# Patient Record
Sex: Female | Born: 1960
Health system: Southern US, Community
[De-identification: ages and names within clinical notes are randomized; demographics above are authoritative.]

## PROBLEM LIST (undated history)

## (undated) DIAGNOSIS — G629 Polyneuropathy, unspecified: Secondary | ICD-10-CM

## (undated) DIAGNOSIS — N049 Nephrotic syndrome with unspecified morphologic changes: Secondary | ICD-10-CM

## (undated) DIAGNOSIS — F4321 Adjustment disorder with depressed mood: Secondary | ICD-10-CM

## (undated) DIAGNOSIS — I10 Essential (primary) hypertension: Secondary | ICD-10-CM

## (undated) DIAGNOSIS — E119 Type 2 diabetes mellitus without complications: Secondary | ICD-10-CM

## (undated) DIAGNOSIS — R300 Dysuria: Secondary | ICD-10-CM

## (undated) DIAGNOSIS — R319 Hematuria, unspecified: Secondary | ICD-10-CM

## (undated) DIAGNOSIS — R251 Tremor, unspecified: Secondary | ICD-10-CM

## (undated) DIAGNOSIS — N189 Chronic kidney disease, unspecified: Secondary | ICD-10-CM

## (undated) DIAGNOSIS — R1110 Vomiting, unspecified: Secondary | ICD-10-CM

## (undated) DIAGNOSIS — R296 Repeated falls: Secondary | ICD-10-CM

## (undated) DIAGNOSIS — R600 Localized edema: Secondary | ICD-10-CM

## (undated) HISTORY — PX: BACK SURGERY: SHX140

## (undated) HISTORY — DX: Adjustment disorder with depressed mood: F43.21

## (undated) HISTORY — PX: FOOT SURGERY: SHX648

## (undated) HISTORY — PX: SHOULDER SURGERY: SHX246

---
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
## 1997-12-18 ENCOUNTER — Emergency Department (HOSPITAL_COMMUNITY): Admission: EM | Admit: 1997-12-18 | Discharge: 1997-12-18 | Payer: Self-pay | Admitting: Emergency Medicine

## 1998-05-29 ENCOUNTER — Emergency Department (HOSPITAL_COMMUNITY): Admission: EM | Admit: 1998-05-29 | Discharge: 1998-05-29 | Payer: Self-pay | Admitting: Emergency Medicine

## 1998-06-05 ENCOUNTER — Emergency Department (HOSPITAL_COMMUNITY): Admission: EM | Admit: 1998-06-05 | Discharge: 1998-06-05 | Payer: Self-pay | Admitting: Emergency Medicine

## 1998-06-19 ENCOUNTER — Encounter: Admission: RE | Admit: 1998-06-19 | Discharge: 1998-09-17 | Payer: Self-pay

## 2000-09-03 ENCOUNTER — Emergency Department (HOSPITAL_COMMUNITY): Admission: EM | Admit: 2000-09-03 | Discharge: 2000-09-03 | Payer: Self-pay | Admitting: Emergency Medicine

## 2000-12-21 ENCOUNTER — Emergency Department (HOSPITAL_COMMUNITY): Admission: EM | Admit: 2000-12-21 | Discharge: 2000-12-22 | Payer: Self-pay | Admitting: Emergency Medicine

## 2000-12-22 ENCOUNTER — Encounter: Payer: Self-pay | Admitting: Emergency Medicine

## 2001-08-05 ENCOUNTER — Emergency Department (HOSPITAL_COMMUNITY): Admission: EM | Admit: 2001-08-05 | Discharge: 2001-08-05 | Payer: Self-pay | Admitting: Emergency Medicine

## 2001-08-05 ENCOUNTER — Encounter: Payer: Self-pay | Admitting: Emergency Medicine

## 2002-04-19 ENCOUNTER — Encounter: Payer: Self-pay | Admitting: Emergency Medicine

## 2002-04-19 ENCOUNTER — Emergency Department (HOSPITAL_COMMUNITY): Admission: EM | Admit: 2002-04-19 | Discharge: 2002-04-19 | Payer: Self-pay | Admitting: Emergency Medicine

## 2003-04-20 ENCOUNTER — Encounter (INDEPENDENT_AMBULATORY_CARE_PROVIDER_SITE_OTHER): Payer: Self-pay | Admitting: *Deleted

## 2003-04-20 LAB — CONVERTED CEMR LAB

## 2003-10-23 ENCOUNTER — Ambulatory Visit (HOSPITAL_COMMUNITY): Admission: RE | Admit: 2003-10-23 | Discharge: 2003-10-23 | Payer: Self-pay | Admitting: Family Medicine

## 2003-10-26 ENCOUNTER — Emergency Department (HOSPITAL_COMMUNITY): Admission: EM | Admit: 2003-10-26 | Discharge: 2003-10-26 | Payer: Self-pay | Admitting: Emergency Medicine

## 2004-06-02 ENCOUNTER — Emergency Department (HOSPITAL_COMMUNITY): Admission: EM | Admit: 2004-06-02 | Discharge: 2004-06-02 | Payer: Self-pay | Admitting: Emergency Medicine

## 2004-06-09 ENCOUNTER — Ambulatory Visit: Payer: Self-pay | Admitting: Family Medicine

## 2005-04-13 ENCOUNTER — Emergency Department (HOSPITAL_COMMUNITY): Admission: EM | Admit: 2005-04-13 | Discharge: 2005-04-13 | Payer: Self-pay | Admitting: Emergency Medicine

## 2005-04-17 ENCOUNTER — Emergency Department (HOSPITAL_COMMUNITY): Admission: EM | Admit: 2005-04-17 | Discharge: 2005-04-17 | Payer: Self-pay | Admitting: Family Medicine

## 2005-06-30 ENCOUNTER — Emergency Department (HOSPITAL_COMMUNITY): Admission: EM | Admit: 2005-06-30 | Discharge: 2005-06-30 | Payer: Self-pay | Admitting: Emergency Medicine

## 2005-07-06 ENCOUNTER — Ambulatory Visit: Payer: Self-pay | Admitting: Family Medicine

## 2005-07-12 ENCOUNTER — Ambulatory Visit (HOSPITAL_COMMUNITY): Admission: RE | Admit: 2005-07-12 | Discharge: 2005-07-12 | Payer: Self-pay | Admitting: Family Medicine

## 2006-06-16 DIAGNOSIS — F17200 Nicotine dependence, unspecified, uncomplicated: Secondary | ICD-10-CM

## 2006-06-16 DIAGNOSIS — E1165 Type 2 diabetes mellitus with hyperglycemia: Secondary | ICD-10-CM

## 2006-06-16 DIAGNOSIS — IMO0002 Reserved for concepts with insufficient information to code with codable children: Secondary | ICD-10-CM

## 2006-06-16 DIAGNOSIS — E1142 Type 2 diabetes mellitus with diabetic polyneuropathy: Secondary | ICD-10-CM

## 2006-06-16 HISTORY — DX: Reserved for concepts with insufficient information to code with codable children: IMO0002

## 2006-06-16 HISTORY — DX: Type 2 diabetes mellitus with diabetic polyneuropathy: E11.42

## 2006-06-16 HISTORY — DX: Nicotine dependence, unspecified, uncomplicated: F17.200

## 2006-06-17 ENCOUNTER — Encounter (INDEPENDENT_AMBULATORY_CARE_PROVIDER_SITE_OTHER): Payer: Self-pay | Admitting: *Deleted

## 2006-10-24 ENCOUNTER — Emergency Department (HOSPITAL_COMMUNITY): Admission: EM | Admit: 2006-10-24 | Discharge: 2006-10-24 | Payer: Self-pay | Admitting: Emergency Medicine

## 2006-11-12 ENCOUNTER — Emergency Department (HOSPITAL_COMMUNITY): Admission: EM | Admit: 2006-11-12 | Discharge: 2006-11-12 | Payer: Self-pay | Admitting: Emergency Medicine

## 2006-12-15 ENCOUNTER — Emergency Department (HOSPITAL_COMMUNITY): Admission: EM | Admit: 2006-12-15 | Discharge: 2006-12-15 | Payer: Self-pay | Admitting: Family Medicine

## 2007-08-10 ENCOUNTER — Emergency Department (HOSPITAL_COMMUNITY): Admission: EM | Admit: 2007-08-10 | Discharge: 2007-08-10 | Payer: Self-pay | Admitting: Family Medicine

## 2008-01-15 ENCOUNTER — Emergency Department (HOSPITAL_COMMUNITY): Admission: EM | Admit: 2008-01-15 | Discharge: 2008-01-15 | Payer: Self-pay | Admitting: Emergency Medicine

## 2009-04-14 ENCOUNTER — Emergency Department (HOSPITAL_COMMUNITY): Admission: EM | Admit: 2009-04-14 | Discharge: 2009-04-14 | Payer: Self-pay | Admitting: Emergency Medicine

## 2009-10-21 ENCOUNTER — Emergency Department (HOSPITAL_COMMUNITY): Admission: EM | Admit: 2009-10-21 | Discharge: 2009-10-21 | Payer: Self-pay | Admitting: Emergency Medicine

## 2009-10-21 ENCOUNTER — Observation Stay (HOSPITAL_COMMUNITY): Admission: EM | Admit: 2009-10-21 | Discharge: 2009-10-22 | Payer: Self-pay | Admitting: Emergency Medicine

## 2009-10-23 ENCOUNTER — Emergency Department (HOSPITAL_COMMUNITY): Admission: EM | Admit: 2009-10-23 | Discharge: 2009-10-23 | Payer: Self-pay | Admitting: Family Medicine

## 2009-12-20 ENCOUNTER — Emergency Department (HOSPITAL_COMMUNITY): Admission: EM | Admit: 2009-12-20 | Discharge: 2009-12-20 | Payer: Self-pay | Admitting: Emergency Medicine

## 2010-01-21 ENCOUNTER — Emergency Department (HOSPITAL_COMMUNITY): Admission: EM | Admit: 2010-01-21 | Discharge: 2010-01-21 | Payer: Self-pay | Admitting: Family Medicine

## 2010-03-02 ENCOUNTER — Ambulatory Visit: Payer: Self-pay | Admitting: Family Medicine

## 2010-03-02 LAB — CONVERTED CEMR LAB
AST: 9 units/L (ref 0–37)
Albumin/Creatinine Ratio, Urine, POC: <30
Albumin: 4.4 g/dL (ref 3.5–5.2)
Calcium: 10.1 mg/dL (ref 8.4–10.5)
Chloride: 101 meq/L (ref 96–112)
Creatinine,U: 100 mg/dL
HCT: 40.9 % (ref 36.0–46.0)
Hemoglobin: 13.8 g/dL (ref 12.0–15.0)
Hgb A1c MFr Bld: 11.6 %
MCHC: 33.7 g/dL (ref 30.0–36.0)
MCV: 84.7 fL (ref 78.0–100.0)
Microalbumin U total vol: 30 mg/L
Platelets: 231 10*3/uL (ref 150–400)
Sodium: 137 meq/L (ref 135–145)
TSH: 0.465 microintl units/mL (ref 0.350–4.500)
WBC: 10.5 10*3/uL (ref 4.0–10.5)

## 2010-03-03 ENCOUNTER — Encounter: Payer: Self-pay | Admitting: Family Medicine

## 2010-03-03 DIAGNOSIS — G2581 Restless legs syndrome: Secondary | ICD-10-CM

## 2010-03-03 HISTORY — DX: Restless legs syndrome: G25.81

## 2010-05-19 NOTE — Assessment & Plan Note (Signed)
Summary: new pt/diabetes/need refills/bmc   Vital Signs:  Patient profile:   50 year old female Height:      61 inches Weight:      133.19 pounds BMI:     25.26 BSA:     1.59 Temp:     98.1 degrees F Pulse rate:   80 / minute BP sitting:   106 / 78  Vitals Entered By: Christen Bame CMA (March 02, 2010 4:03 PM) CC: NP-reestablish care Is Patient Diabetic? Yes Did you bring your meter with you today? No Pain Assessment Patient in pain? no        CC:  NP-reestablish care.  History of Present Illness:  Type 1 diabetes mellitus follow-up      This is a 50 year old woman who presents with Type 2 diabetes mellitus follow-up.  She has not been seen here for several years.  She had been to Urgent Care and found to have a BS of 600.  She has a sister who is on dialysis, had a stroke and an amputation with diabetes.   The patient complains of polyuria, polydipsia, and weight loss, but denies blurred vision.  Other symptoms include neuropathic pain, pain in feet at night and cannot get comfortable.  The patient reports good dietary compliance, noncompliance with medications, exercising regularly, and not monitoring blood glucose. Needs glucometer. Since the last visit, the patient reports having had eye care by an ophthalmologist and no foot care.    Habits & Providers  Alcohol-Tobacco-Diet     Alcohol drinks/day: 0     Alcohol type: wine     >5/day in last 3 mos: no     Tobacco Status: current     Tobacco Counseling: to quit use of tobacco products     Cigarette Packs/Day: 0.75     Year Started: 1979  Exercise-Depression-Behavior     Does Patient Exercise: yes  Current Medications (verified): 1)  Metformin Hcl 500 Mg Tabs (Metformin Hcl) .Marland Kitchen.. 1 By Mouth Two Times A Day  Allergies (verified): No Known Drug Allergies  Past History:  Family History: Last updated: 03/02/2010 Diabetes 1st degree, Gout in father Family History Hypertension  Social History: Last  updated: 03/02/2010 Lives alone.  Work as Engineer, technical sales person at [employer]; Has dtr and grandtr and 4 grand sons who live in [place] Single  Risk Factors: Alcohol Use: 0 (03/02/2010) >5 drinks/d w/in last 3 months: no (03/02/2010) Exercise: yes (03/02/2010)  Risk Factors: Smoking Status: current (03/02/2010) Packs/Day: 0.75 (03/02/2010)  Past Medical History: Diabetes mellitus, type II  Past Surgical History: Caesarean section Arthroscopyboth shoulders  Family History: Diabetes 1st degree, Gout in father Family History Hypertension  Social History: Lives alone.  Work as Engineer, technical sales person at [employer]; Has dtr and grandtr and 4 grand sons who live in [place] Single Smoking Status:  current Packs/Day:  0.75 Does Patient Exercise:  yes  Review of Systems  The patient denies anorexia, chest pain, syncope, dyspnea on exertion, peripheral edema, prolonged cough, abdominal pain, hematochezia, and severe indigestion/heartburn.    Physical Exam  General:  alert, well-developed, and well-nourished.   Head:  normocephalic and atraumatic.   Ears:  External ear exam shows no significant lesions or deformities.  Otoscopic examination reveals clear canals, tympanic membranes are intact bilaterally without bulging, retraction, inflammation or discharge. Hearing is grossly normal bilaterally. Mouth:  Oral mucosa and oropharynx without lesions or exudates.  Teeth in good repair. Neck:  No deformities, masses, or tenderness noted. Lungs:  normal  respiratory effort and normal breath sounds.   Heart:  normal rate, regular rhythm, no murmur, and no gallop.   Abdomen:  soft and non-tender.    Diabetes Management Exam:    Foot Exam (with socks and/or shoes not present):       Sensory-Pinprick/Light touch:          Left medial foot (L-4): normal          Left dorsal foot (L-5): normal          Left lateral foot (S-1): normal          Right medial foot (L-4): normal          Right dorsal foot (L-5): normal           Right lateral foot (S-1): normal       Sensory-Monofilament:          Left foot: normal          Right foot: normal       Inspection:          Right foot: normal       Nails:          Left foot: normal          Right foot: normal    Eye Exam:       Eye Exam done elsewhere          Date: 03/02/2009          Results: normal   Impression & Recommendations:  Problem # 1:  DIABETES MELLITUS II, UNCOMPLICATED ([id number]) Probably needs ACE-I.  Will await blood work today.  Needs improved glycemic coverage secondary to A1C being > 11. Pt. has missed mammogram but prefers to re-schedule herself. Orders: A1C-[facility] [id number]) CBC-[facility] [id number]) TSH-[facility] [id number]) UA Microalbumin-[facility] [id number]) Comp Met-[facility] [id number])  Her updated medication list for this problem includes:    Metformin Hcl 500 Mg Tabs (Metformin hcl) .Marland Kitchen... 1 by mouth two times a day    Glyburide 2.5 Mg Tabs (Glyburide) .Marland Kitchen... 1 by mouth two times a day  Problem # 2:  TOBACCO DEPENDENCE (ICD-305.1) Discussed Smoking Cessation  Complete Medication List: 1)  Metformin Hcl 500 Mg Tabs (Metformin hcl) .Marland Kitchen.. 1 by mouth two times a day 2)  Glyburide 2.5 Mg Tabs (Glyburide) .Marland Kitchen.. 1 by mouth two times a day 3)  Onetouch Ultra Mini W/device Kit (Blood glucose monitoring suppl) .... Check blood sugars twice daily 4)  Onetouch Test Strp (Glucose blood) .... Use as directed 5)  Onetouch Lancets Misc (Lancets) .... Use as directed Prescriptions: ONETOUCH LANCETS  MISC (LANCETS) use as directed  #1 mo supply x 5   Entered and Authorized by:   Darron Doom MD   Signed by:   Darron Doom MD on 03/02/2010   Method used:   [facility] [address] [phone]* (retail)       [address]       [place], [place]  [address]       Ph: [phone] or [phone]       Fax: [phone]   RxID:   [id number] TEST  STRP (GLUCOSE BLOOD) use as directed  #1 bottle x 5   Entered and  Authorized by:   Darron Doom MD   Signed by:   Darron Doom MD on 03/02/2010   Method used:   [facility]  [facility] [address] [phone]* (retail)       [address]       [place], [place]  [address]       Ph: [phone] or [phone]       Fax: [phone]   RxID:   [id number] [facility] ULTRA MINI W/DEVICE KIT (BLOOD GLUCOSE MONITORING SUPPL) check blood sugars twice daily  #1 x 0   Entered and Authorized by:   Darron Doom MD   Signed by:   Darron Doom MD on 03/02/2010   Method used:   Electronically to        [facility] [address] [phone]* (retail)       [address]       [place], [place]  [address]       Ph: [phone] or [phone]       Fax: [phone]   RxID:   [id number] GLYBURIDE 2.5 MG TABS (GLYBURIDE) 1 by mouth two times a day  #60 x 2   Entered and Authorized by:   Darron Doom MD   Signed by:   Darron Doom MD on 03/02/2010   Method used:   Electronically to        [facility] [address] [phone]* (retail)       [address]       [place], [place]  [address]       Ph: [phone] or [phone]       Fax: [phone]   RxID:   [id number] METFORMIN HCL 500 MG TABS (METFORMIN HCL) 1 by mouth two times a day  #60 x 2   Entered and Authorized by:   Darron Doom MD   Signed by:   Darron Doom MD on 03/02/2010   Method used:   Electronically to        [facility] [address] [phone]* (retail)       [address]       [place], [place]  [address]       Ph: [phone] or [phone]       Fax: [phone]   RxID:   [id number]    Orders Added: 1)  A1C-[facility] [83036] 2)  CBC-[facility] [id number] 3)  TSH-[facility] [id number] 4)  UA Microalbumin-[facility] [id number])  Comp Met-[facility] [id number] 6)  [facility] [[address]]    Laboratory Results   Urine Tests  Date/Time Received: March 02, 2010 3:45 PM  Date/Time Reported: March 02, 2010 5:08  PM   Microalbumin (urine): 30 mg/L Creatinine: 100mg /dL  A:C Ratio <30 Normal Comments: ...............test performed by......Marland KitchenBonnie A. Martinique, MLS (ASCP)cm   Blood Tests   Date/Time Received: March 02, 2010 3:45 PM  Date/Time Reported: March 02, 2010 3:58 PM   HGBA1C: 11.6%   (Normal Range: Non-Diabetic - 3-6%   Control Diabetic - 6-8%)  Comments: ...........test performed by...........Marland KitchenHedy Camara, CMA

## 2010-05-19 NOTE — Miscellaneous (Signed)
  Clinical Lists Changes  Medications: Added new medication of BENAZEPRIL HCL 5 MG TABS (BENAZEPRIL HCL) 1 by mouth daily - Signed Added new medication of GABAPENTIN 300 MG CAPS (GABAPENTIN) 1 by mouth q hs - Signed Rx of BENAZEPRIL HCL 5 MG TABS (BENAZEPRIL HCL) 1 by mouth daily;  #30 x 2;  Signed;  Entered by: Darron Doom MD;  Authorized by: Darron Doom MD;  Method used: [facility] [address] [phone]*, [address] [place], [place], [place]  [address], Ph: [phone] or [phone], Fax: [phone] Rx of GABAPENTIN 300 MG CAPS (GABAPENTIN) 1 by mouth q hs;  #30 x 2;  Signed;  Entered by: Darron Doom MD;  Authorized by: Darron Doom MD;  Method used: Electronically to [facility] [address] [phone]*, [address], [place], [place], [place]  [address], Ph: [phone] or [phone], Fax: [phone]    Prescriptions: GABAPENTIN 300 MG CAPS (GABAPENTIN) 1 by mouth q hs  #30 x 2   Entered and Authorized by:   Darron Doom MD   Signed by:   Darron Doom MD on 03/03/2010   Method used:   Electronically to        [facility] [address] [phone]* (retail)       [address]       [place], [place]  [address]       Ph: [phone] or [phone]       Fax: [phone]   RxID:   [id number] BENAZEPRIL HCL 5 MG TABS (BENAZEPRIL HCL) 1 by mouth daily  #30 x 2   Entered and Authorized by:   Darron Doom MD   Signed by:   Darron Doom MD on 03/03/2010   Method used:   Electronically to        [facility] [address] [phone]* (retail)       [address]       [place], [place]  [address]       Ph: [phone] or [phone]       Fax: [phone]   RxID:   [id number]   Appended Document:     Clinical Lists Changes  Problems: Added new problem of RESTLESS LEG SYNDROME, MILD (ICD-333.94)      Appended Document:  ---- 03/03/2010 11:00 AM, Darron Doom MD wrote: Lucia Bitter pt., nml labs (kidney funx)  thyroid, needs kidney protection, low dose BP med.  Also have called in med for feet----She will know what that is---forgot it yesterday.  The above was copied from a flag.  LMOVM for pt and she contacted me back today.  She was informed of the above and agreeable.

## 2010-06-19 ENCOUNTER — Inpatient Hospital Stay (INDEPENDENT_AMBULATORY_CARE_PROVIDER_SITE_OTHER)
Admission: RE | Admit: 2010-06-19 | Discharge: 2010-06-19 | Disposition: A | Discharge status: Home / Self Care | Payer: 59 | Source: Ambulatory Visit | Attending: Emergency Medicine | Admitting: Emergency Medicine

## 2010-06-19 DIAGNOSIS — M545 Low back pain: Secondary | ICD-10-CM

## 2010-06-19 DIAGNOSIS — M62838 Other muscle spasm: Secondary | ICD-10-CM

## 2010-07-01 LAB — GLUCOSE, CAPILLARY: Glucose-Capillary: 261 mg/dL — ABNORMAL HIGH (ref 70–99)

## 2010-07-02 LAB — GLUCOSE, CAPILLARY: Glucose-Capillary: 249 mg/dL — ABNORMAL HIGH (ref 70–99)

## 2010-07-05 LAB — URINE CULTURE: Colony Count: >=100000

## 2010-07-05 LAB — DIFFERENTIAL
Basophils Absolute: 0.1 10*3/uL (ref 0.0–0.1)
Eosinophils Absolute: 0.1 10*3/uL (ref 0.0–0.7)
Lymphs Abs: 4.5 10*3/uL — ABNORMAL HIGH (ref 0.7–4.0)
Monocytes Absolute: 0.5 10*3/uL (ref 0.1–1.0)
Neutro Abs: 5.3 10*3/uL (ref 1.7–7.7)
Neutrophils Relative %: 51 % (ref 43–77)

## 2010-07-05 LAB — CBC
HCT: 38.4 % (ref 36.0–46.0)
MCH: 29.5 pg (ref 26.0–34.0)
Platelets: 222 10*3/uL (ref 150–400)
RDW: 12.5 % (ref 11.5–15.5)

## 2010-07-05 LAB — URINALYSIS, ROUTINE W REFLEX MICROSCOPIC
Bilirubin Urine: NEGATIVE
Glucose, UA: >1000 mg/dL — AB
Ketones, ur: NEGATIVE mg/dL
Protein, ur: NEGATIVE mg/dL
Specific Gravity, Urine: 1.04 — ABNORMAL HIGH (ref 1.005–1.030)
Urobilinogen, UA: 1 mg/dL (ref 0.0–1.0)
pH: 6.5 (ref 5.0–8.0)

## 2010-07-05 LAB — BASIC METABOLIC PANEL
CO2: 22 mEq/L (ref 19–32)
Chloride: 101 mEq/L (ref 96–112)
GFR calc Af Amer: >60 mL/min (ref >60)
Glucose, Bld: 528 mg/dL — ABNORMAL HIGH (ref 70–99)
Potassium: 4.5 mEq/L (ref 3.5–5.1)

## 2010-07-05 LAB — POCT I-STAT, CHEM 8: HCT: 44 % (ref 36.0–46.0)

## 2010-07-05 LAB — POCT URINALYSIS DIP (DEVICE)
Glucose, UA: >=1000 mg/dL — AB
Nitrite: POSITIVE — AB
Protein, ur: NEGATIVE mg/dL

## 2010-07-05 LAB — URINE MICROSCOPIC-ADD ON

## 2010-07-05 LAB — WOUND CULTURE

## 2010-07-05 LAB — GLUCOSE, CAPILLARY: Glucose-Capillary: 129 mg/dL — ABNORMAL HIGH (ref 70–99)

## 2011-01-10 ENCOUNTER — Emergency Department (HOSPITAL_COMMUNITY)
Admission: EM | Admit: 2011-01-10 | Discharge: 2011-01-10 | Disposition: A | Discharge status: Home / Self Care | Payer: No Typology Code available for payment source | Attending: Emergency Medicine | Admitting: Emergency Medicine

## 2011-01-10 DIAGNOSIS — M545 Low back pain, unspecified: Secondary | ICD-10-CM | POA: Insufficient documentation

## 2011-01-18 LAB — URINE MICROSCOPIC-ADD ON

## 2011-01-18 LAB — URINALYSIS, ROUTINE W REFLEX MICROSCOPIC
Glucose, UA: >1000 — AB
Hgb urine dipstick: NEGATIVE
Protein, ur: NEGATIVE

## 2011-01-18 LAB — POCT PREGNANCY, URINE: Preg Test, Ur: NEGATIVE

## 2013-07-27 ENCOUNTER — Encounter (HOSPITAL_COMMUNITY): Payer: Self-pay | Admitting: Emergency Medicine

## 2013-07-27 ENCOUNTER — Emergency Department (HOSPITAL_COMMUNITY)
Admission: EM | Admit: 2013-07-27 | Discharge: 2013-07-28 | Disposition: A | Discharge status: Home / Self Care | Payer: 59 | Attending: Emergency Medicine | Admitting: Emergency Medicine

## 2013-07-27 DIAGNOSIS — E119 Type 2 diabetes mellitus without complications: Secondary | ICD-10-CM | POA: Insufficient documentation

## 2013-07-27 DIAGNOSIS — Z79899 Other long term (current) drug therapy: Secondary | ICD-10-CM | POA: Insufficient documentation

## 2013-07-27 DIAGNOSIS — F17200 Nicotine dependence, unspecified, uncomplicated: Secondary | ICD-10-CM | POA: Insufficient documentation

## 2013-07-27 DIAGNOSIS — G629 Polyneuropathy, unspecified: Secondary | ICD-10-CM

## 2013-07-27 DIAGNOSIS — R739 Hyperglycemia, unspecified: Secondary | ICD-10-CM

## 2013-07-27 DIAGNOSIS — G589 Mononeuropathy, unspecified: Secondary | ICD-10-CM | POA: Insufficient documentation

## 2013-07-27 HISTORY — DX: Type 2 diabetes mellitus without complications: E11.9

## 2013-07-27 LAB — CBC WITH DIFFERENTIAL/PLATELET
BASOS PCT: 1 % (ref 0–1)
Basophils Absolute: 0.1 10*3/uL (ref 0.0–0.1)
EOS PCT: 2 % (ref 0–5)
Eosinophils Absolute: 0.2 10*3/uL (ref 0.0–0.7)
HEMATOCRIT: 37.5 % (ref 36.0–46.0)
HEMOGLOBIN: 12.6 g/dL (ref 12.0–15.0)
LYMPHS PCT: 58 % — AB (ref 12–46)
Lymphs Abs: 6.2 10*3/uL — ABNORMAL HIGH (ref 0.7–4.0)
MCH: 28.8 pg (ref 26.0–34.0)
MCHC: 33.6 g/dL (ref 30.0–36.0)
MCV: 85.6 fL (ref 78.0–100.0)
MONOS PCT: 4 % (ref 3–12)
Monocytes Absolute: 0.4 10*3/uL (ref 0.1–1.0)
NEUTROS ABS: 3.7 10*3/uL (ref 1.7–7.7)
NEUTROS PCT: 35 % — AB (ref 43–77)
Platelets: 187 10*3/uL (ref 150–400)
RBC: 4.38 MIL/uL (ref 3.87–5.11)
RDW: 12.9 % (ref 11.5–15.5)
WBC: 10.6 10*3/uL — ABNORMAL HIGH (ref 4.0–10.5)

## 2013-07-27 LAB — COMPREHENSIVE METABOLIC PANEL
ALK PHOS: 96 U/L (ref 39–117)
ALT: 10 U/L (ref 0–35)
AST: 15 U/L (ref 0–37)
Albumin: 3.3 g/dL — ABNORMAL LOW (ref 3.5–5.2)
BUN: 13 mg/dL (ref 6–23)
CALCIUM: 9.1 mg/dL (ref 8.4–10.5)
CO2: 26 meq/L (ref 19–32)
Chloride: 94 mEq/L — ABNORMAL LOW (ref 96–112)
Creatinine, Ser: 0.95 mg/dL (ref 0.50–1.10)
GFR calc Af Amer: 77 mL/min — ABNORMAL LOW (ref >90)
GFR, EST NON AFRICAN AMERICAN: 67 mL/min — AB (ref >90)
Glucose, Bld: 572 mg/dL (ref 70–99)
POTASSIUM: 4.6 meq/L (ref 3.7–5.3)
SODIUM: 134 meq/L — AB (ref 137–147)
Total Bilirubin: 0.3 mg/dL (ref 0.3–1.2)
Total Protein: 6.9 g/dL (ref 6.0–8.3)

## 2013-07-27 LAB — CBG MONITORING, ED: GLUCOSE-CAPILLARY: 515 mg/dL — AB (ref 70–99)

## 2013-07-27 NOTE — ED Notes (Signed)
The pt has had bi-lateral leg pain  And pain in the toes in both feet for 3 days.  She called out from work  Midwife

## 2013-07-28 LAB — URINE MICROSCOPIC-ADD ON

## 2013-07-28 LAB — URINALYSIS, ROUTINE W REFLEX MICROSCOPIC
Bilirubin Urine: NEGATIVE
Hgb urine dipstick: NEGATIVE
Ketones, ur: NEGATIVE mg/dL
LEUKOCYTES UA: NEGATIVE
Nitrite: NEGATIVE
PH: 6.5 (ref 5.0–8.0)
PROTEIN: NEGATIVE mg/dL
Urobilinogen, UA: 0.2 mg/dL (ref 0.0–1.0)

## 2013-07-28 LAB — CBG MONITORING, ED
GLUCOSE-CAPILLARY: 350 mg/dL — AB (ref 70–99)
GLUCOSE-CAPILLARY: 297 mg/dL — AB (ref 70–99)

## 2013-07-28 MED ORDER — SODIUM CHLORIDE 0.9 % IV BOLUS (SEPSIS)
1000.0000 mL | Freq: Once | INTRAVENOUS | Status: AC
  Administered 2013-07-28: 1000 mL via INTRAVENOUS

## 2013-07-28 MED ORDER — GLIPIZIDE 10 MG PO TABS: 10.0000 mg | ORAL_TABLET | Freq: Every day | ORAL | Status: DC

## 2013-07-28 MED ORDER — HYDROCODONE-ACETAMINOPHEN 5-325 MG PO TABS
1.0000 | ORAL_TABLET | Freq: Once | ORAL | Status: AC
  Administered 2013-07-28: 1 via ORAL
  Filled 2013-07-28: qty 1

## 2013-07-28 MED ORDER — TRAMADOL HCL 50 MG PO TABS: 50.0000 mg | ORAL_TABLET | Freq: Four times a day (QID) | ORAL | Status: DC | PRN

## 2013-07-28 NOTE — Discharge Instructions (Signed)
Please follow up with your primary care provider for your elevated blood sugar.   Hyperglycemia Hyperglycemia occurs when the glucose (sugar) in your blood is too high. Hyperglycemia can happen for many reasons, but it most often happens to people who do not know they have diabetes or are not managing their diabetes properly.  CAUSES  Whether you have diabetes or not, there are other causes of hyperglycemia. Hyperglycemia can occur when you have diabetes, but it can also occur in other situations that you might not be as aware of, such as: Diabetes  If you have diabetes and are having problems controlling your blood glucose, hyperglycemia could occur because of some of the following reasons:  Not following your meal plan.  Not taking your diabetes medications or not taking it properly.  Exercising less or doing less activity than you normally do.  Being sick. Pre-diabetes  This cannot be ignored. Before people develop Type 2 diabetes, they almost always have "pre-diabetes." This is when your blood glucose levels are higher than normal, but not yet high enough to be diagnosed as diabetes. Research has shown that some long-term damage to the body, especially the heart and circulatory system, may already be occurring during pre-diabetes. If you take action to manage your blood glucose when you have pre-diabetes, you may delay or prevent Type 2 diabetes from developing. Stress  If you have diabetes, you may be "diet" controlled or on oral medications or insulin to control your diabetes. However, you may find that your blood glucose is higher than usual in the hospital whether you have diabetes or not. This is often referred to as "stress hyperglycemia." Stress can elevate your blood glucose. This happens because of hormones put out by the body during times of stress. If stress has been the cause of your high blood glucose, it can be followed regularly by your caregiver. That way he/she can make  sure your hyperglycemia does not continue to get worse or progress to diabetes. Steroids  Steroids are medications that act on the infection fighting system (immune system) to block inflammation or infection. One side effect can be a rise in blood glucose. Most people can produce enough extra insulin to allow for this rise, but for those who cannot, steroids make blood glucose levels go even higher. It is not unusual for steroid treatments to "uncover" diabetes that is developing. It is not always possible to determine if the hyperglycemia will go away after the steroids are stopped. A special blood test called an A1c is sometimes done to determine if your blood glucose was elevated before the steroids were started. SYMPTOMS  Thirsty.  Frequent urination.  Dry mouth.  Blurred vision.  Tired or fatigue.  Weakness.  Sleepy.  Tingling in feet or leg. DIAGNOSIS  Diagnosis is made by monitoring blood glucose in one or all of the following ways:  A1c test. This is a chemical found in your blood.  Fingerstick blood glucose monitoring.  Laboratory results. TREATMENT  First, knowing the cause of the hyperglycemia is important before the hyperglycemia can be treated. Treatment may include, but is not be limited to:  Education.  Change or adjustment in medications.  Change or adjustment in meal plan.  Treatment for an illness, infection, etc.  More frequent blood glucose monitoring.  Change in exercise plan.  Decreasing or stopping steroids.  Lifestyle changes. HOME CARE INSTRUCTIONS   Test your blood glucose as directed.  Exercise regularly. Your caregiver will give you instructions about exercise.  Pre-diabetes or diabetes which comes on with stress is helped by exercising.  Eat wholesome, balanced meals. Eat often and at regular, fixed times. Your caregiver or nutritionist will give you a meal plan to guide your sugar intake.  Being at an ideal weight is important. If  needed, losing as little as 10 to 15 pounds may help improve blood glucose levels. SEEK MEDICAL CARE IF:   You have questions about medicine, activity, or diet.  You continue to have symptoms (problems such as increased thirst, urination, or weight gain). SEEK IMMEDIATE MEDICAL CARE IF:   You are vomiting or have diarrhea.  Your breath smells fruity.  You are breathing faster or slower.  You are very sleepy or incoherent.  You have numbness, tingling, or pain in your feet or hands.  You have chest pain.  Your symptoms get worse even though you have been following your caregiver's orders.  If you have any other questions or concerns. Document Released: 09/29/2000 Document Revised: 06/28/2011 Document Reviewed: 08/02/2011 [facility] Patient Information 2014 [place], [place].    Neuropathic Pain We often think that pain has a physical cause. If we get rid of the cause, the pain should go away. Nerves themselves can also cause pain. It is called neuropathic pain, which means nerve abnormality. It may be difficult for the patients who have it and for the treating caregivers. Pain is usually described as acute (short-lived) or chronic (long-lasting). Acute pain is related to the physical sensations caused by an injury. It can last from a few seconds to many weeks, but it usually goes away when normal healing occurs. Chronic pain lasts beyond the typical healing time. With neuropathic pain, the nerve fibers themselves may be damaged or injured. They then send incorrect signals to other pain centers. The pain you feel is real, but the cause is not easy to find.  CAUSES  Chronic pain can result from diseases, such as diabetes and shingles (an infection related to chickenpox), or from trauma, surgery, or amputation. It can also happen without any known injury or disease. The nerves are sending pain messages, even though there is no identifiable cause for such messages.   Other common causes of  neuropathy include diabetes, phantom limb pain, or Regional Pain Syndrome (RPS).  As with all forms of chronic back pain, if neuropathy is not correctly treated, there can be a number of associated problems that lead to a downward cycle for the patient. These include depression, sleeplessness, feelings of fear and anxiety, limited social interaction and inability to do normal daily activities or work.  The most dramatic and mysterious example of neuropathic pain is called "phantom limb syndrome." This occurs when an arm or a leg has been removed because of illness or injury. The brain still gets pain messages from the nerves that originally carried impulses from the missing limb. These nerves now seem to misfire and cause troubling pain.  Neuropathic pain often seems to have no cause. It responds poorly to standard pain treatment. Neuropathic pain can occur after:  Shingles (herpes zoster virus infection).  A lasting burning sensation of the skin, caused usually by injury to a peripheral nerve.  Peripheral neuropathy which is widespread nerve damage, often caused by diabetes or alcoholism.  Phantom limb pain following an amputation.  Facial nerve problems (trigeminal neuralgia).  Multiple sclerosis.  Reflex sympathetic dystrophy.  Pain which comes with cancer and cancer chemotherapy.  Entrapment neuropathy such as when pressure is put on a nerve such as  in carpal tunnel syndrome.  Back, leg, and hip problems (sciatica).  Spine or back surgery.  HIV Infection or AIDS where nerves are infected by viruses. Your caregiver can explain items in the above list which may apply to you. SYMPTOMS  Characteristics of neuropathic pain are:  Severe, sharp, electric shock-like, shooting, lightening-like, knife-like.  Pins and needles sensation.  Deep burning, deep cold, or deep ache.  Persistent numbness, tingling, or weakness.  Pain resulting from light touch or other stimulus that  would not usually cause pain.  Increased sensitivity to something that would normally cause pain, such as a pinprick. Pain may persist for months or years following the healing of damaged tissues. When this happens, pain signals no longer sound an alarm about current injuries or injuries about to happen. Instead, the alarm system itself is not working correctly.  Neuropathic pain may get worse instead of better over time. For some people, it can lead to serious disability. It is important to be aware that severe injury in a limb can occur without a proper, protective pain response.Burns, cuts, and other injuries may go unnoticed. Without proper treatment, these injuries can become infected or lead to further disability. Take any injury seriously, and consult your caregiver for treatment. DIAGNOSIS  When you have a pain with no known cause, your caregiver will probably ask some specific questions:   Do you have any other conditions, such as diabetes, shingles, multiple sclerosis, or HIV infection?  How would you describe your pain? (Neuropathic pain is often described as shooting, stabbing, burning, or searing.)  Is your pain worse at any time of the day? (Neuropathic pain is usually worse at night.)  Does the pain seem to follow a certain physical pathway?  Does the pain come from an area that has missing or injured nerves? (An example would be phantom limb pain.)  Is the pain triggered by minor things such as rubbing against the sheets at night? These questions often help define the type of pain involved. Once your caregiver knows what is happening, treatment can begin. Anticonvulsant, antidepressant drugs, and various pain relievers seem to work in some cases. If another condition, such as diabetes is involved, better management of that disorder may relieve the neuropathic pain.  TREATMENT  Neuropathic pain is frequently long-lasting and tends not to respond to treatment with narcotic type  pain medication. It may respond well to other drugs such as antiseizure and antidepressant medications. Usually, neuropathic problems do not completely go away, but partial improvement is often possible with proper treatment. Your caregivers have large numbers of medications available to treat you. Do not be discouraged if you do not get immediate relief. Sometimes different medications or a combination of medications will be tried before you receive the results you are hoping for. See your caregiver if you have pain that seems to be coming from nowhere and does not go away. Help is available.  SEEK IMMEDIATE MEDICAL CARE IF:   There is a sudden change in the quality of your pain, especially if the change is on only one side of the body.  You notice changes of the skin, such as redness, black or purple discoloration, swelling, or an ulcer.  You cannot move the affected limbs. Document Released: 01/01/2004 Document Revised: 06/28/2011 Document Reviewed: 01/01/2004 [facility] Patient Information 2014 [place].

## 2013-07-28 NOTE — ED Provider Notes (Signed)
CSN: [id number]     Arrival date & time 07/27/13  2200 History   First MD Initiated Contact with Patient 07/28/13 0007     Chief Complaint  Patient presents with  . Leg Pain   HPI  History provided by the patient. Patient is a 53 year old female with history of diabetes presenting with complaints of worsening pain and burning sensation to bilateral feet and toes. Symptoms have been worsening for the past 3 days to one week. Patient states she was told in the past that she had borderline diabetes was on metformin but stopped taking this due to side effects. She does report some increased polyuria and polydipsia. Denies any vision change. No other pain or numbness in extremities. No back pain. No fever, chills or sweats. No injuries to the feet. Symptoms are worsened with any kind of touch or pressure. She has not used any treatment to help with symptoms. No other aggravating or alleviating factors. No other associated symptoms.    Past Medical History  Diagnosis Date  . Diabetes mellitus without complication    History reviewed. No pertinent past surgical history. No family history on file. History  Substance Use Topics  . Smoking status: Current Every Day Smoker  . Smokeless tobacco: Not on file  . Alcohol Use: No   OB History   Grav Para Term Preterm Abortions TAB SAB Ect Mult Living                 Review of Systems  Constitutional: Negative for fever and chills.  Eyes: Negative for visual disturbance.  Gastrointestinal: Negative for nausea, vomiting and diarrhea.  Endocrine: Positive for polydipsia and polyuria.  Musculoskeletal: Negative for back pain.  Neurological: Negative for weakness.  All other systems reviewed and are negative.     Allergies  Review of patient's allergies indicates no known allergies.  Home Medications   Current Outpatient Rx  Name  Route  Sig  Dispense  Refill  . CRANBERRY PO   Oral   Take 1 tablet by mouth daily.         . ferrous  sulfate 325 (65 FE) MG tablet   Oral   Take 325 mg by mouth daily with breakfast.          BP 98/55  Pulse 81  Temp(Src) 98.7 F (37.1 C) (Oral)  Resp 18  Ht 5\' 1"  (1.549 m)  Wt 124 lb (56.246 kg)  BMI 23.44 kg/m2  SpO2 100% Physical Exam  Nursing note and vitals reviewed. Constitutional: She is oriented to person, place, and time. She appears well-developed and well-nourished. No distress.  HENT:  Head: Normocephalic.  Cardiovascular: Normal rate and regular rhythm.   Pulmonary/Chest: Effort normal and breath sounds normal. No respiratory distress. She has no wheezes. She has no rales.  Abdominal: Soft.  Musculoskeletal: Normal range of motion. She exhibits no edema and no tenderness.  Patient reports feeling tingling and burning sensations to light touch in the feet. There is no swelling. Skin appears normal except for a thickened area of dry skin to the pad of the plantar surface of left foot.  Neurological: She is alert and oriented to person, place, and time.  Skin: Skin is warm and dry. No rash noted.  Psychiatric: She has a normal mood and affect. Her behavior is normal.    ED Course  Procedures   COORDINATION OF CARE:  Nursing notes reviewed. Vital signs reviewed. Initial pt interview and examination performed.   Filed Vitals:  07/28/13 0023 07/28/13 0025 07/28/13 0030 07/28/13 0100  BP: 120/69  108/76 98/55  Pulse:  80 85 81  Temp:      TempSrc:      Resp:      Height:      Weight:      SpO2:  100% 98% 100%    1:10 AM-patient seen and evaluated. She appears well no acute distress. Patient does have significantly elevated blood sugar. Does report prior history of diabetes on metformin but states this caused hair loss and she stopped taking it. She has not been on any new medications since that time.  Anion gap 14. No ketones in urine.  Pressure has improved with fluids. Patient reports that she has an upcoming appointment with PCP. At this time we'll  give prescriptions for glipizide for diabetes and tramadol. Patient urged to discuss her blood sugars with her PCP for continued treatment.  Treatment plan initiated: Medications  HYDROcodone-acetaminophen (NORCO/VICODIN) 5-325 MG per tablet 1 tablet (not administered)  sodium chloride 0.9 % bolus 1,000 mL (1,000 mLs Intravenous New Bag/Given 07/28/13 0026)    Results for orders placed during the hospital encounter of 07/27/13  CBC WITH DIFFERENTIAL      Result Value Ref Range   WBC 10.6 (*) 4.0 - 10.5 K/uL   RBC 4.38  3.87 - 5.11 MIL/uL   Hemoglobin 12.6  12.0 - 15.0 g/dL   HCT 37.5  36.0 - 46.0 %   MCV 85.6  78.0 - 100.0 fL   MCH 28.8  26.0 - 34.0 pg   MCHC 33.6  30.0 - 36.0 g/dL   RDW 12.9  11.5 - 15.5 %   Platelets 187  150 - 400 K/uL   Neutrophils Relative % 35 (*) 43 - 77 %   Lymphocytes Relative 58 (*) 12 - 46 %   Monocytes Relative 4  3 - 12 %   Eosinophils Relative 2  0 - 5 %   Basophils Relative 1  0 - 1 %   Neutro Abs 3.7  1.7 - 7.7 K/uL   Lymphs Abs 6.2 (*) 0.7 - 4.0 K/uL   Monocytes Absolute 0.4  0.1 - 1.0 K/uL   Eosinophils Absolute 0.2  0.0 - 0.7 K/uL   Basophils Absolute 0.1  0.0 - 0.1 K/uL   WBC Morphology ATYPICAL LYMPHOCYTES    URINALYSIS, ROUTINE W REFLEX MICROSCOPIC      Result Value Ref Range   Color, Urine YELLOW  YELLOW   APPearance CLEAR  CLEAR   Specific Gravity, Urine <1.005 (*) 1.005 - 1.030   pH 6.5  5.0 - 8.0   Glucose, UA >1000 (*) NEGATIVE mg/dL   Hgb urine dipstick NEGATIVE  NEGATIVE   Bilirubin Urine NEGATIVE  NEGATIVE   Ketones, ur NEGATIVE  NEGATIVE mg/dL   Protein, ur NEGATIVE  NEGATIVE mg/dL   Urobilinogen, UA 0.2  0.0 - 1.0 mg/dL   Nitrite NEGATIVE  NEGATIVE   Leukocytes, UA NEGATIVE  NEGATIVE  COMPREHENSIVE METABOLIC PANEL      Result Value Ref Range   Sodium 134 (*) 137 - 147 mEq/L   Potassium 4.6  3.7 - 5.3 mEq/L   Chloride 94 (*) 96 - 112 mEq/L   CO2 26  19 - 32 mEq/L   Glucose, Bld 572 (*) 70 - 99 mg/dL   BUN 13  6 - 23  mg/dL   Creatinine, Ser 0.95  0.50 - 1.10 mg/dL   Calcium 9.1  8.4 - 10.5 mg/dL  Total Protein 6.9  6.0 - 8.3 g/dL   Albumin 3.3 (*) 3.5 - 5.2 g/dL   AST 15  0 - 37 U/L   ALT 10  0 - 35 U/L   Alkaline Phosphatase 96  39 - 117 U/L   Total Bilirubin 0.3  0.3 - 1.2 mg/dL   GFR calc non Af Amer 67 (*) >90 mL/min   GFR calc Af Amer 77 (*) >90 mL/min  URINE MICROSCOPIC-ADD ON      Result Value Ref Range   Squamous Epithelial / LPF FEW (*) RARE   WBC, UA 3-6  <3 WBC/hpf   RBC / HPF 0-2  <3 RBC/hpf   Bacteria, UA FEW (*) RARE   Urine-Other RARE YEAST    CBG MONITORING, ED      Result Value Ref Range   Glucose-Capillary 515 (*) 70 - 99 mg/dL   Comment 1 Notify RN         MDM   Final diagnoses:  Hyperglycemia  Neuropathy       Martie Lee, PA-C 07/28/13 [phone]

## 2013-07-28 NOTE — ED Provider Notes (Signed)
Medical screening examination/treatment/procedure(s) were performed by non-physician practitioner and as supervising physician I was immediately available for consultation/collaboration.   EKG Interpretation None       Kalman Drape, MD 07/28/13 [phone]

## 2013-07-30 LAB — PATHOLOGIST SMEAR REVIEW

## 2013-09-08 ENCOUNTER — Emergency Department (INDEPENDENT_AMBULATORY_CARE_PROVIDER_SITE_OTHER)
Admission: EM | Admit: 2013-09-08 | Discharge: 2013-09-08 | Disposition: A | Discharge status: Home / Self Care | Payer: 59 | Source: Home / Self Care

## 2013-09-08 DIAGNOSIS — F17200 Nicotine dependence, unspecified, uncomplicated: Secondary | ICD-10-CM

## 2013-09-08 DIAGNOSIS — E0840 Diabetes mellitus due to underlying condition with diabetic neuropathy, unspecified: Secondary | ICD-10-CM

## 2013-09-08 DIAGNOSIS — Z720 Tobacco use: Secondary | ICD-10-CM

## 2013-09-08 DIAGNOSIS — E1349 Other specified diabetes mellitus with other diabetic neurological complication: Secondary | ICD-10-CM

## 2013-09-08 DIAGNOSIS — E119 Type 2 diabetes mellitus without complications: Secondary | ICD-10-CM

## 2013-09-08 DIAGNOSIS — E1142 Type 2 diabetes mellitus with diabetic polyneuropathy: Secondary | ICD-10-CM

## 2013-09-08 DIAGNOSIS — E1165 Type 2 diabetes mellitus with hyperglycemia: Secondary | ICD-10-CM

## 2013-09-08 LAB — GLUCOSE, CAPILLARY: Glucose-Capillary: 402 mg/dL — ABNORMAL HIGH (ref 70–99)

## 2013-09-08 MED ORDER — INSULIN ASPART 100 UNIT/ML ~~LOC~~ SOLN
10.0000 [IU] | Freq: Once | SUBCUTANEOUS | Status: AC
  Administered 2013-09-08: 10 [IU] via SUBCUTANEOUS

## 2013-09-08 MED ORDER — GABAPENTIN 300 MG PO CAPS: ORAL_CAPSULE | ORAL | Status: DC

## 2013-09-08 MED ORDER — GLIPIZIDE 10 MG PO TABS: 10.0000 mg | ORAL_TABLET | Freq: Every day | ORAL | Status: DC

## 2013-09-08 MED ORDER — INSULIN ASPART 100 UNIT/ML ~~LOC~~ SOLN
SUBCUTANEOUS | Status: AC
  Filled 2013-09-08: qty 1

## 2013-09-08 NOTE — ED Provider Notes (Signed)
Medical screening examination/treatment/procedure(s) were performed by resident physician or non-physician practitioner and as supervising physician I was immediately available for consultation/collaboration.   Pauline Good MD.   Billy Fischer, MD 09/08/13 [phone]

## 2013-09-08 NOTE — Discharge Instructions (Signed)
Correction Insulin Your health care provider has decided you need to take insulin regularly. You have been given a correction scale (also called a sliding scale) in case you need extra insulin when your blood sugar is too high (hyperglycemia). The following instructions will assist you in how to use that correction scale.  WHAT IS A CORRECTION SCALE?  When you check your blood sugar, sometimes it will be higher than your health care provider has told you it should be. You may need an extra dose of insulin to bring your blood sugar to the recommended level (also known as your goal, target, or normal level). The correction scale is prescribed by your health care provider based on your specific needs.  Your correction scale has two parts:   The first shows you a blood sugar range.   The second part tells you how much extra insulin to give yourself if your blood sugar falls within this range. You will not need an extra dose of insulin if your blood glucose is in the desired range. You should simply give yourself the normal amount of insulin that your health care provider has ordered for you.  WHY IS IT IMPORTANT TO KEEP YOUR BLOOD SUGAR LEVELS AT YOUR DESIRED LEVEL?  Keeping your blood sugar at the desired level helps to prevent long-term complications of diabetes, such as eye disease, kidney failure, nerve damage, and other serious complications. WHAT TYPE OF INSULIN WILL YOU USE?  To help bring down blood sugar levels that are too high, your health care provider will prescribe a short-acting or a rapid-acting insulin. An example of a short-acting insulin would be regular insulin. Remember, you may also have a longer-acting insulin prescribed for you.  WHAT DO YOU NEED TO DO?   Check your blood sugar with your home blood glucose meter as recommended by your health care provider.   Using your correction scale, find the range that your blood sugar lies in.   Look for the units of insulin  that match that blood sugar range. Give yourself the dose of correction insulin your health care provider has prescribed. Always make sure you are using the right type of insulin.   Prior to the injection, make sure you have food available that you can eat in the next 15 30 minutes.   If your correction insulin is rapid acting, start eating your meal within 15 minutes after you have given yourself the insulin injection. If you wait longer than 15 minutes to eat, your blood sugar might get too low.   If your correction insulin is short acting(regular), start eating your meal within 30 minutes after you have given yourself the insulin injection. If you wait longer than 30 minutes to eat, your blood sugar might get too low. Symptoms of low blood sugar (hypoglycemia) may include feeling shaky or weak, sweating, feeling confused, difficulty seeing, agitation, crankiness, or numbness of the lips or tongue. Check your blood sugar immediately and treat your results as directed by your health care provider.   Keep a log of your blood sugar results with the time you took the test and the amount of insulin that you injected. This information will help your health care provider manage your medicines.   Note on your log anything that may affect your blood sugar level, such as:   Changes in normal exercise or activity.   Changes in your normal schedule, such as staying up late, going on vacation, changing your diet, or holidays.  New medicines. This includes prescription and over-the-counter medicines. Some medicines may cause high blood sugar.   Sickness, stress, or anxiety.   Changes in the time you took your medicine.   Changes in your meals, such as skipping a meal, having a late meal, or dining out.   Eating things that may affect blood glucose, such as snacks, meal portions that are larger than normal, drinks with sugar, or eating less than usual.   Ask your health care provider any  questions you have.  Be aware of "stacking" your insulin doses. This happens when you correct a high blood sugar level by giving yourself extra insulin too soon after a previous correction dose or mealtime dose. You may then have too much insulin still active in your body and may be at risk for hypoglycemia. WHY DO YOU NEED A CORRECTION SCALE IF YOU HAVE NEVER BEEN DIAGNOSED WITH DIABETES?   Keeping your blood glucose in the target range is important for your overall health.   You may have been prescribed medicines that cause your blood glucose to be higher than normal. WHEN SHOULD YOU SEEK MEDICAL CARE? Contact your health care provider if:   You have experienced hypoglycemia that you are unable to treat with your usual routine.   You have a high blood sugar level that is not coming down with the correction dose.  Your blood sugar is often too low or does not come up even if you eat a fast-acting carbohydrate. Someone who lives with you should seek immediate medical care if you become unresponsive. Document Released: 08/27/2010 Document Revised: 12/06/2012 Document Reviewed: 09/15/2012 [facility] Patient Information 2014 [place].  Diabetic Neuropathy Diabetic neuropathy is a nerve disease or nerve damage that is caused by diabetes mellitus. About half of all people with diabetes mellitus have some form of nerve damage. Nerve damage is more common in those who have had diabetes mellitus for many years and who generally have not had good control of their blood sugar (glucose) level. Diabetic neuropathy is a common complication of diabetes mellitus. There are three more common types of diabetic neuropathy and a fourth type that is less common and less understood:   Peripheral neuropathy This is the most common type of diabetic neuropathy. It causes damage to the nerves of the feet and legs first and then eventually the hands and arms.The damage affects the ability to sense  touch.  Autonomic neuropathy This type causes damage to the autonomic nervous system, which controls the following functions:  Heartbeat.  Body temperature.  Blood pressure.  Urination.  Digestion.  Sweating.  Sexual function.  Focal neuropathy Focal neuropathy can be painful and unpredictable and occurs most often in older adults with diabetes mellitus. It involves a specific nerve or one area and often comes on suddenly. It usually does not cause long-term problems.  Radiculoplexus neuropathy Sometimes called lumbosacral radiculoplexus neuropathy, radiculoplexus neuropathy affects the nerves of the thighs, hips, buttocks, or legs. It is more common in people with type 2 diabetes mellitus and in older men. It is characterized by debilitating pain, weakness, and atrophy, usually in the thigh muscles. CAUSES  The cause of peripheral, autonomic, and focal neuropathies is diabetes mellitus that is uncontrolled and high glucose levels. The cause of radiculoplexus neuropathy is unknown. However, it is thought to be caused by inflammation related to uncontrolled glucose levels. SIGNS AND SYMPTOMS  Peripheral Neuropathy Peripheral neuropathy develops slowly over time. When the nerves of the feet and legs no longer  work there may be:   Burning, stabbing, or aching pain in the legs or feet.  Inability to feel pressure or pain in your feet. This can lead to:  Thick calluses over pressure areas.  Pressure sores.  Ulcers.  Foot deformities.  Reduced ability to feel temperature changes.  Muscle weakness. Autonomic Neuropathy The symptoms of autonomic neuropathy vary depending on which nerves are affected. Symptoms may include:  Problems with digestion, such as:  Feeling sick to your stomach (nausea).  Vomiting.  Bloating.  Constipation.  Diarrhea.  Abdominal pain.  Difficulty with urination. This occurs if you lose your ability to sense when your bladder is full.  Problems include:  Urine leakage (incontinence).  Inability to empty your bladder completely (retention).  Rapid or irregular heartbeat (palpitations).  Blood pressure drops when you stand up (orthostatic hypotension). When you stand up you may feel:  Dizzy.  Weak.  Faint.  In men, inability to attain and maintain an erection.  In women, vaginal dryness and problems with decreased sexual desire and arousal.  Problems with body temperature regulation.  Increased or decreased sweating. Focal Neuropathy  Abnormal eye movements or abnormal alignment of both eyes.  Weakness in the wrist.  Foot drop. This results in an inability to lift the foot properly and abnormal walking or foot movement.  Paralysis on one side of your face (Bell palsy).  Chest or abdominal pain. Radiculoplexus Neuropathy  Sudden, severe pain in your hip, thigh, or buttocks.  Weakness and wasting of thigh muscles.  Difficulty rising from a seated position.  Abdominal swelling.  Unexplained weight loss (usually more than 10 lb [4.5 kg]). DIAGNOSIS  Peripheral Neuropathy Your senses may be tested. Sensory function testing can be done with:  A light touch using a monofilament.  A vibration with tuning fork.  A sharp sensation with a pin prick. Other tests that can help diagnose neuropathy are:  Nerve conduction velocity. This test checks the transmission of an electrical current through a nerve.  Electromyography. This shows how muscles respond to electrical signals transmitted by nearby nerves.  Quantitative sensory testing. This is used to assess how your nerves respond to vibrations and changes in temperature. Autonomic Neuropathy Diagnosis is often based on reported symptoms. Tell your health care provider if you experience:   Dizziness.   Constipation.   Diarrhea.   Inappropriate urination or inability to urinate.   Inability to get or maintain an erection.  Tests that may  be done include:   Electrocardiography or Holter monitor. These are tests that can help show problems with the heart rate or heart rhythm.   An X-ray exam may be done. Focal Neuropathy Diagnosis is made based on your symptoms and what your health care provider finds during your exam. Other tests may be done. They may include:  Nerve conduction velocities. This checks the transmission of electrical current through a nerve.  Electromyography. This shows how muscles respond to electrical signals transmitted by nearby nerves.  Quantitative sensory testing. This test is used to assess how your nerves respond to vibration and changes in temperature. Radiculoplexus Neuropathy  Often the first thing is to eliminate any other issue or problems that might be the cause, as there is no stick test for diagnosis.  X-ray exam of your spine and lumbar region.  Spinal tap to rule out cancer.  MRI to rule out other lesions. TREATMENT  Once nerve damage occurs, it cannot be reversed. The goal of treatment is to keep the  disease or nerve damage from getting worse and affecting more nerve fibers. Controlling your blood glucose level is the key. Most people with radiculoplexus neuropathy see at least a partial improvement over time. You will need to keep your blood glucose and HbA1c levels in the target range determined by your health care provider. Things that help control blood glucose levels include:   Blood glucose monitoring.   Meal planning.   Physical activity.   Diabetes medicine.  Over time, maintaining lower blood glucose levels helps lessen symptoms. Sometimes, prescription pain medicine is needed. HOME CARE INSTRUCTIONS:  Do not smoke.  Keep your blood glucose level in the range that you and your health care provider have determined acceptable for you.  Keep your blood pressure level in the range that you and your health care provider have determined acceptable for you.  Eat a  well-balanced diet.  Be active every day.  Check your feet every day. SEEK MEDICAL CARE IF:   You have burning, stabbing, or aching pain in the legs or feet.  You are unable to feel pressure or pain in your feet.  You develop problems with digestion such as:  Nausea.  Vomiting.  Bloating.  Constipation.  Diarrhea.  Abdominal pain.  You have difficulty with urination, such as:  Incontinence.  Retention.  You have palpitations.  You develop orthostatic hypotension. When you stand up you may feel:  Dizzy.  Weak.  Faint.  You cannot attain and maintain an erection (in men).  You have vaginal dryness and problems with decreased sexual desire and arousal (in women).  You have severe pain in your thighs, legs, or buttocks.  You have unexplained weight loss. Document Released: 06/14/2001 Document Revised: 01/24/2013 Document Reviewed: 09/14/2012 [facility] Patient Information 2014 [place].  Monitoring for Diabetes There are two blood tests that help you monitor and manage your diabetes. These include:  An A1c (hemoglobin A1c) test.  This test is an average of your glucose (or blood sugar) control over the past 3 months.  This is recommended as a way for you and your caregiver to understand how well your glucose levels are controlled on the average.  Your A1c goal will be determined by your caregiver, but it is usually best if it is less than 6.5% to 7.0%.  Glucose (sugar) attaches itself to red blood cells. The amount of glucose then can then be measured. The amount of glucose on the cells depends on how high your blood glucose has been.  SMBG test (self-monitoring blood glucose).  Using a blood glucose monitor (meter) to do SMBG testing is an easy way to monitor the amount of glucose in your blood and can help you improve your control. The monitor will tell you what your blood glucose is at that very moment. Every person with diabetes should have a  blood glucose monitor and know how to use it. The better you control your blood sugar on a daily basis, the better your A1c levels will be. HOW OFTEN SHOULD I HAVE AN A1C LEVEL?  Every 3 months if your diabetes is not well controlled or if therapy has changed.  Every 6 months if you are meeting your treatment goals. HOW OFTEN SHOULD I DO SMBG TESTING?  Your caregiver will recommend how often you should test. Testing times are based on the kind of medicine you take, type of diabetes you have, and your blood glucose control. Testing times can include:  Type 1 diabetes: test 3 or 4 times  a day or as directed.  Type 2 diabetes and if you are taking insulin and diabetes pills: test 3 or 4 times a day or as directed.  If you are taking diabetes pills only and not reaching your target A1c: test 2 to 4 times a day or as directed.  If you are taking diabetes pills and are controlling your diabetes well with diet and exercise, your caregiver will help you decide what is appropriate. WHAT TIME OF DAY SHOULD I TEST?  The best time of day to test your blood glucose depends on medications, mealtimes, exercise, and blood glucose control. It is best to test at different times because this will help you know how you are doing throughout the day. Your caregiver will help you decide what is best. WHAT SHOULD MY BLOOD GLUCOSE BE? Blood glucose target goals may vary depending on each persons needs, whether they have type 1 or type 2 diabetes or what medications they are taking. However, as a general rule, blood glucose should be:  Before meals   70-130 mg/dl.  After meals    ..less than 180 mg/dl. CHECK YOUR BLOOD GLUCOSE IF:  You have symptoms of low blood sugar (hypoglycemia), which may include dizziness, shaking, sweating, chills and confusion.  You have symptoms of high blood sugar (hyperglycemia), which may include sleepiness, blurred vision, frequent urination and excessive thirst.  You are learning  how meals, physical activity and medicine affect your blood glucose level. The more you learn about how various foods, your medications, and activities affect you, the better job you will do of taking care of yourself.  You have a job in which poor control could cause safety problems while driving or operating machinery. CHECK YOUR BLOOD SUGAR MORE FREQUENTLY:  If you have medication or dietary changes.  If you begin taking other kinds of medicines.  If you become sick or your level of stress increases. With an illness, your blood sugar may even be high without eating.  Before and after exercise. Follow your caregiver's testing recommendations during this time.  TO DISPOSE OF SHARPS: Each city or state may have different regulations. Check with your public works or Theatre manager.  Sharps containers can be purchased from pharmacies.  Place all used sharps in a container. You do not need to replace any protective covers over the needle or break the needle.  Sharps should be contained in a ridge, leakproof, puncture-resistant container.  Plastic detergent bottle.  Bleach bottle.  When container is almost full, add a solution that is 1 part laundry bleach and 9 parts tap water (it is okay to use undiluted bleach if you wish). You may want to wear gloves since bleach can damage tissue. Let the solution sit for 30 minutes.  Carefully pour all the liquid into the sanitary sewer. Be sure to prevent the sharps from falling out.  Once liquid is drained, reseal the container with lid and tape it shut with duct tape. This will prevent the cap from coming off.  Dispose of the container with your regular household trash and waste. It is a good idea to let your trash hauler know that you will be disposing of sharps. Document Released: 04/08/2003 Document Revised: 12/29/2011 Document Reviewed: 10/07/2008 [facility] Patient Information 2014 [place],  [place].  Hyperglycemia Hyperglycemia occurs when the glucose (sugar) in your blood is too high. Hyperglycemia can happen for many reasons, but it most often happens to people who do not know they have diabetes or are  not managing their diabetes properly.  CAUSES  Whether you have diabetes or not, there are other causes of hyperglycemia. Hyperglycemia can occur when you have diabetes, but it can also occur in other situations that you might not be as aware of, such as: Diabetes  If you have diabetes and are having problems controlling your blood glucose, hyperglycemia could occur because of some of the following reasons:  Not following your meal plan.  Not taking your diabetes medications or not taking it properly.  Exercising less or doing less activity than you normally do.  Being sick. Pre-diabetes  This cannot be ignored. Before people develop Type 2 diabetes, they almost always have "pre-diabetes." This is when your blood glucose levels are higher than normal, but not yet high enough to be diagnosed as diabetes. Research has shown that some long-term damage to the body, especially the heart and circulatory system, may already be occurring during pre-diabetes. If you take action to manage your blood glucose when you have pre-diabetes, you may delay or prevent Type 2 diabetes from developing. Stress  If you have diabetes, you may be "diet" controlled or on oral medications or insulin to control your diabetes. However, you may find that your blood glucose is higher than usual in the hospital whether you have diabetes or not. This is often referred to as "stress hyperglycemia." Stress can elevate your blood glucose. This happens because of hormones put out by the body during times of stress. If stress has been the cause of your high blood glucose, it can be followed regularly by your caregiver. That way he/she can make sure your hyperglycemia does not continue to get worse or progress to  diabetes. Steroids  Steroids are medications that act on the infection fighting system (immune system) to block inflammation or infection. One side effect can be a rise in blood glucose. Most people can produce enough extra insulin to allow for this rise, but for those who cannot, steroids make blood glucose levels go even higher. It is not unusual for steroid treatments to "uncover" diabetes that is developing. It is not always possible to determine if the hyperglycemia will go away after the steroids are stopped. A special blood test called an A1c is sometimes done to determine if your blood glucose was elevated before the steroids were started. SYMPTOMS  Thirsty.  Frequent urination.  Dry mouth.  Blurred vision.  Tired or fatigue.  Weakness.  Sleepy.  Tingling in feet or leg. DIAGNOSIS  Diagnosis is made by monitoring blood glucose in one or all of the following ways:  A1c test. This is a chemical found in your blood.  Fingerstick blood glucose monitoring.  Laboratory results. TREATMENT  First, knowing the cause of the hyperglycemia is important before the hyperglycemia can be treated. Treatment may include, but is not be limited to:  Education.  Change or adjustment in medications.  Change or adjustment in meal plan.  Treatment for an illness, infection, etc.  More frequent blood glucose monitoring.  Change in exercise plan.  Decreasing or stopping steroids.  Lifestyle changes. HOME CARE INSTRUCTIONS   Test your blood glucose as directed.  Exercise regularly. Your caregiver will give you instructions about exercise. Pre-diabetes or diabetes which comes on with stress is helped by exercising.  Eat wholesome, balanced meals. Eat often and at regular, fixed times. Your caregiver or nutritionist will give you a meal plan to guide your sugar intake.  Being at an ideal weight is important. If needed, losing  as little as 10 to 15 pounds may help improve blood  glucose levels. SEEK MEDICAL CARE IF:   You have questions about medicine, activity, or diet.  You continue to have symptoms (problems such as increased thirst, urination, or weight gain). SEEK IMMEDIATE MEDICAL CARE IF:   You are vomiting or have diarrhea.  Your breath smells fruity.  You are breathing faster or slower.  You are very sleepy or incoherent.  You have numbness, tingling, or pain in your feet or hands.  You have chest pain.  Your symptoms get worse even though you have been following your caregiver's orders.  If you have any other questions or concerns. Document Released: 09/29/2000 Document Revised: 06/28/2011 Document Reviewed: 08/02/2011 [facility] Patient Information 2014 [place], [place].

## 2013-09-08 NOTE — ED Provider Notes (Signed)
CSN: [id number]     Arrival date & time 09/08/13  [id number] History   First MD Initiated Contact with Patient 09/08/13 1005     No chief complaint on file.  (Consider location/radiation/quality/duration/timing/severity/associated sxs/prior Treatment) HPI Comments: 53 year old female with a history of uncontrolled type 2 diabetes mellitus currently treated with glipizide has been out of her medicine for an unknown period of time. She has been seen at the family practice [facility] adjacent to the [facility] urgent care however she does not see them regularly. She has had several episodes of hypoglycemia in the past and sometimes having to go to the emergency department. She is awake, alert and in no acute distress now. She is asking for refill of her glipizide and something for pain for her peripheral neuropathy.   Past Medical History  Diagnosis Date  . Diabetes mellitus without complication    No past surgical history on file. No family history on file. History  Substance Use Topics  . Smoking status: Current Every Day Smoker  . Smokeless tobacco: Not on file  . Alcohol Use: No   OB History   Grav Para Term Preterm Abortions TAB SAB Ect Mult Living                 Review of Systems  Constitutional: Negative for fever, activity change and appetite change.  HENT: Negative.   Respiratory: Negative.   Cardiovascular: Negative.   Gastrointestinal: Negative.   Musculoskeletal: Negative.   Neurological: Positive for numbness.    Allergies  Review of patient's allergies indicates no known allergies.  Home Medications   Prior to Admission medications   Medication Sig Start Date End Date Taking? Authorizing Provider  CRANBERRY PO Take 1 tablet by mouth daily.    Historical Provider, MD  ferrous sulfate 325 (65 FE) MG tablet Take 325 mg by mouth daily with breakfast.    Historical Provider, MD  glipiZIDE (GLUCOTROL) 10 MG tablet Take 1 tablet (10 mg total) by mouth daily before breakfast. 07/28/13    Martie Lee, PA-C  traMADol (ULTRAM) 50 MG tablet Take 1 tablet (50 mg total) by mouth every 6 (six) hours as needed. 07/28/13   Ruthell Rummage Dammen, PA-C   BP 134/81  Pulse 86  Temp(Src) 98.5 F (36.9 C) (Oral)  Resp 18  SpO2 100% Physical Exam  Nursing note and vitals reviewed. Constitutional: She is oriented to person, place, and time. She appears well-developed and well-nourished. No distress.  HENT:  Mouth/Throat: Oropharynx is clear and moist. No oropharyngeal exudate.  Eyes: EOM are normal.  Mild conjunctival redness  Neck: Normal range of motion. Neck supple.  Cardiovascular: Normal rate, regular rhythm and normal heart sounds.   Pulmonary/Chest: Effort normal and breath sounds normal. No respiratory distress.  Musculoskeletal: She exhibits no edema.  Neurological: She is alert and oriented to person, place, and time. She exhibits normal muscle tone.  Skin: Skin is warm and dry.    ED Course  Procedures (including critical care time) Labs Review Labs Reviewed  GLUCOSE, CAPILLARY - Abnormal; Notable for the following:    Glucose-Capillary 402 (*)    All other components within normal limits    Imaging Review No results found.   MDM   1. T2DM (type 2 diabetes mellitus)   2. Diabetes mellitus due to underlying condition with diabetic neuropathy   3. Tobacco abuse disorder   4. Type 2 diabetes mellitus with hyperglycemia    humalog 10 mg SQ now, go home and  eat and then start glucotrol   BS is 402. Glipizide 10 mg q d, #30. Drink plenty of liquids.  Stop smoking. Gabapentin 300 mg in increasing doses as directed.       Janne Napoleon, NP 09/08/13 [phone]

## 2013-10-25 ENCOUNTER — Encounter: Payer: Self-pay | Admitting: Family Medicine

## 2013-10-25 ENCOUNTER — Ambulatory Visit (INDEPENDENT_AMBULATORY_CARE_PROVIDER_SITE_OTHER): Payer: 59 | Admitting: Family Medicine

## 2013-10-25 VITALS — BP 98/62 | HR 87 | Ht 61.0 in | Wt 125.1 lb

## 2013-10-25 DIAGNOSIS — E1143 Type 2 diabetes mellitus with diabetic autonomic (poly)neuropathy: Secondary | ICD-10-CM

## 2013-10-25 DIAGNOSIS — E1149 Type 2 diabetes mellitus with other diabetic neurological complication: Secondary | ICD-10-CM

## 2013-10-25 DIAGNOSIS — E1142 Type 2 diabetes mellitus with diabetic polyneuropathy: Secondary | ICD-10-CM

## 2013-10-25 DIAGNOSIS — F17200 Nicotine dependence, unspecified, uncomplicated: Secondary | ICD-10-CM

## 2013-10-25 DIAGNOSIS — E1340 Other specified diabetes mellitus with diabetic neuropathy, unspecified: Secondary | ICD-10-CM

## 2013-10-25 DIAGNOSIS — E785 Hyperlipidemia, unspecified: Secondary | ICD-10-CM

## 2013-10-25 DIAGNOSIS — E1140 Type 2 diabetes mellitus with diabetic neuropathy, unspecified: Secondary | ICD-10-CM | POA: Insufficient documentation

## 2013-10-25 DIAGNOSIS — E119 Type 2 diabetes mellitus without complications: Secondary | ICD-10-CM

## 2013-10-25 DIAGNOSIS — G909 Disorder of the autonomic nervous system, unspecified: Secondary | ICD-10-CM

## 2013-10-25 LAB — LIPID PANEL
CHOL/HDL RATIO: 5.5 ratio
Cholesterol: 220 mg/dL — ABNORMAL HIGH (ref 0–200)
HDL: 40 mg/dL (ref >39)
LDL Cholesterol: 144 mg/dL — ABNORMAL HIGH (ref 0–99)
Triglycerides: 181 mg/dL — ABNORMAL HIGH (ref <150)
VLDL: 36 mg/dL (ref 0–40)

## 2013-10-25 LAB — POCT GLYCOSYLATED HEMOGLOBIN (HGB A1C)

## 2013-10-25 MED ORDER — AMITRIPTYLINE HCL 25 MG PO TABS: 25.0000 mg | ORAL_TABLET | Freq: Every day | ORAL | Status: DC

## 2013-10-25 MED ORDER — NICOTINE 7 MG/24HR TD PT24: 7.0000 mg | MEDICATED_PATCH | Freq: Every day | TRANSDERMAL | Status: DC

## 2013-10-25 MED ORDER — METFORMIN HCL 500 MG PO TABS: 500.0000 mg | ORAL_TABLET | Freq: Two times a day (BID) | ORAL | Status: DC

## 2013-10-25 MED ORDER — GLIPIZIDE 10 MG PO TABS: 10.0000 mg | ORAL_TABLET | Freq: Every day | ORAL | Status: DC

## 2013-10-25 NOTE — Assessment & Plan Note (Signed)
Pt does not like gabapentin, states she gets side effects that are undesirable.  Discontinue gabapentin, start amitriptyline 25 mg HS.

## 2013-10-25 NOTE — Patient Instructions (Signed)
Diabetes and Small Vessel Disease Small vessel disease (microvascular disease) includes nephropathy, retinopathy, and neuropathy. People with diabetes are at risk for these problems, but keeping blood glucose (sugar) controlled is helpful in preventing problems. DIABETIC KIDNEY PROBLEMS (DIABETIC NEPHROPATHY)  Diabetic nephropathy occurs in many patients with diabetes.  Damage to the small vessels in the kidneys is the leading cause of end-stage renal disease (ESRD).  Protein in the urine (albuminuria) in the range of 30 to 300 mg/24 h (microalbuminuria) is a sign of the earliest stage of diabetic nephropathy.  Good blood glucose (sugar) and blood pressure control significantly reduce the progression of nephropathy. DIABETIC EYE PROBLEMS (DIABETIC RETINOPATHY)  Diabetic retinopathy is the most common cause of new cases of blindness in adults. It is related to the number of years you have had diabetes.  Common risk factors include high blood sugar (hyperglycemia), high blood pressure (hypertension), and poorly controlled blood lipids such as high blood cholesterol (hypercholesterolemia). DIABETIC NERVE PROBLEMS (DIABETIC NEUROPATHY) Diabetic neuropathy is the most common, long-term complication of diabetes. It is responsible for more than half of leg amputations not due to accidents. The main risk for developing diabetic neuropathy seems to be uncontrolled blood sugars. Hyperglycemia damages the nerve fibers causing sensation (feeling) problems. The closer you can keep the following guidelines, the better chance you will have avoiding problems from small vessel disease.  Working toward near normal blood glucose or as normal as possible. You will need to keep your blood glucose and A1c at the target range prescribed by your caregiver.  Keep your blood pressure less than 120/80.  Keep your low-density lipoprotein (LDL) cholesterol (one of the fats in your blood) at less than 100 mg/dL. An LDL  less than 70 mg/dL may be recommended for high risk patients. You cannot change your family history, but it is important to change the risk factors that you can. Risk factors you can control include:  Controlling high blood pressure.  Stopping smoking.  Using alcohol only in moderation. Generally, this means about one drink per day for women and two drinks per day for men.  Controlling your blood lipids (cholesterol and triglycerides).  Treating heart problems, if these are contributing to risk. SEEK MEDICAL CARE IF:   You are having problems keeping your blood glucose in goal range.  You notice a change in your vision or new problems with your vision.  You have wound or sore that does not heal.  Your blood pressure is above the target range. Document Released: 04/08/2003 Document Revised: 03/22/2012 Document Reviewed: 09/13/2008 [facility] Patient Information 2015 [place], [place]. This information is not intended to replace advice given to you by your health care provider. Make sure you discuss any questions you have with your health care provider.  Diets for Diabetes, Food Labeling Look at food labels to help you decide how much of a product you can eat. You will want to check the amount of total carbohydrate in a serving to see how the food fits into your meal plan. In the list of ingredients, the ingredient present in the largest amount by weight must be listed first, followed by the other ingredients in descending order. STANDARD OF IDENTITY Most products have a list of ingredients. However, foods that the Food and Drug Administration (FDA) has given a standard of identity do not need a list of ingredients. A standard of identity means that a food must contain certain ingredients if it is called a particular name. Examples are mayonnaise, peanut butter,  ketchup, jelly, and cheese. LABELING TERMS There are many terms found on food labels. Some of these terms have specific definitions.  Some terms are regulated by the FDA, and the FDA has clearly specified how they can be used. Others are not regulated or well-defined and can be misleading and confusing. SPECIFICALLY DEFINED TERMS Nutritive Sweetener.  A sweetener that contains calories,such as table sugar or honey. Nonnutritive Sweetener.  A sweetener with few or no calories,such as saccharin, aspartame, sucralose, and cyclamate. LABELING TERMS REGULATED BY THE FDA Free.  The product contains only a tiny or small amount of fat, cholesterol, sodium, sugar, or calories. For example, a "fat-free" product will contain less than 0.5 g of fat per serving. Low.  A food described as "low" in fat, saturated fat, cholesterol, sodium, or calories could be eaten fairly often without exceeding dietary guidelines. For example, "low in fat" means no more than 3 g of fat per serving. Lean.  "Lean" and "extra lean" are U.S. Department of Agriculture Scientist, research (physical sciences)) terms for use on meat and poultry products. "Lean" means the product contains less than 10 g of fat, 4 g of saturated fat, and 95 mg of cholesterol per serving. "Lean" is not as low in fat as a product labeled "low." Extra Lean.  "Extra lean" means the product contains less than 5 g of fat, 2 g of saturated fat, and 95 mg of cholesterol per serving. While "extra lean" has less fat than "lean," it is still higher in fat than a product labeled "low." Reduced, Less, Fewer.  A diet product that contains 25% less of a nutrient or calories than the regular version. For example, hot dogs might be labeled "25% less fat than our regular hot dogs." Light/Lite.  A diet product that contains  fewer calories or  the fat of the original. For example, "light in sodium" means a product with  the usual sodium. More.  One serving contains at least 10% more of the daily value of a vitamin, mineral, or fiber than usual. Good Source Of.  One serving contains 10% to 19% of the daily value for a  particular vitamin, mineral, or fiber. Excellent Source Of.  One serving contains 20% or more of the daily value for a particular nutrient. Other terms used might be "high in" or "rich in." Enriched or Fortified.  The product contains added vitamins, minerals, or protein. Nutrition labeling must be used on enriched or fortified foods. Imitation.  The product has been altered so that it is lower in protein, vitamins, or minerals than the usual food,such as imitation peanut butter. Total Fat.  The number listed is the total of all fat found in a serving of the product. Under total fat, food labels must list saturated fat and trans fat, which are associated with raising bad cholesterol and an increased risk of heart blood vessel disease. Saturated Fat.  Mainly fats from animal-based sources. Some examples are red meat, cheese, cream, whole milk, and coconut oil. Trans Fat.  Found in some fried snack foods, packaged foods, and fried restaurant foods. It is recommended you eat as close to 0 g of trans fat as possible, since it raises bad cholesterol and lowers good cholesterol. Polyunsaturated and Monounsaturated Fats.  More healthful fats. These fats are from plant sources. Total Carbohydrate.  The number of carbohydrate grams in a serving of the product. Under total carbohydrate are listed the other carbohydrate sources, such as dietary fiber and sugars. Dietary Fiber.  A carbohydrate  from plant sources. Sugars.  Sugars listed on the label contain all naturally occurring sugars as well as added sugars. LABELING TERMS NOT REGULATED BY THE FDA Sugarless.  Table sugar (sucrose) has not been added. However, the manufacturer may use another form of sugar in place of sucrose to sweeten the product. For example, sugar alcohols are used to sweeten foods. Sugar alcohols are a form of sugar but are not table sugar. If a product contains sugar alcohols in place of sucrose, it can still be labeled  "sugarless." Low Salt, Salt-Free, Unsalted, No Salt, No Salt Added, Without Added Salt.  Food that is usually processed with salt has been made without salt. However, the food may contain sodium-containing additives, such as preservatives, leavening agents, or flavorings. Natural.  This term has no legal meaning. Organic.  Foods that are certified as organic have been inspected and approved by the USDA to ensure they are produced without pesticides, fertilizers containing synthetic ingredients, bioengineering, or ionizing radiation. Document Released: 04/08/2003 Document Revised: 06/28/2011 Document Reviewed: 10/24/2008 [facility] Patient Information 2015 [place], [place]. This information is not intended to replace advice given to you by your health care provider. Make sure you discuss any questions you have with your health care provider.  Diabetes Mellitus and Food It is important for you to manage your blood sugar (glucose) level. Your blood glucose level can be greatly affected by what you eat. Eating healthier foods in the appropriate amounts throughout the day at about the same time each day will help you control your blood glucose level. It can also help slow or prevent worsening of your diabetes mellitus. Healthy eating may even help you improve the level of your blood pressure and reach or maintain a healthy weight.  HOW CAN FOOD AFFECT ME? Carbohydrates Carbohydrates affect your blood glucose level more than any other type of food. Your dietitian will help you determine how many carbohydrates to eat at each meal and teach you how to count carbohydrates. Counting carbohydrates is important to keep your blood glucose at a healthy level, especially if you are using insulin or taking certain medicines for diabetes mellitus. Alcohol Alcohol can cause sudden decreases in blood glucose (hypoglycemia), especially if you use insulin or take certain medicines for diabetes mellitus. Hypoglycemia can  be a life-threatening condition. Symptoms of hypoglycemia (sleepiness, dizziness, and disorientation) are similar to symptoms of having too much alcohol.  If your health care provider has given you approval to drink alcohol, do so in moderation and use the following guidelines:  Women should not have more than one drink per day, and men should not have more than two drinks per day. One drink is equal to:  12 oz of beer.  5 oz of wine.  1 oz of hard liquor.  Do not drink on an empty stomach.  Keep yourself hydrated. Have water, diet soda, or unsweetened iced tea.  Regular soda, juice, and other mixers might contain a lot of carbohydrates and should be counted. WHAT FOODS ARE NOT RECOMMENDED? As you make food choices, it is important to remember that all foods are not the same. Some foods have fewer nutrients per serving than other foods, even though they might have the same number of calories or carbohydrates. It is difficult to get your body what it needs when you eat foods with fewer nutrients. Examples of foods that you should avoid that are high in calories and carbohydrates but low in nutrients include:  Trans fats (most processed foods list  trans fats on the Nutrition Facts label).  Regular soda.  Juice.  Candy.  Sweets, such as cake, pie, doughnuts, and cookies.  Fried foods. WHAT FOODS CAN I EAT? Have nutrient-rich foods, which will nourish your body and keep you healthy. The food you should eat also will depend on several factors, including:  The calories you need.  The medicines you take.  Your weight.  Your blood glucose level.  Your blood pressure level.  Your cholesterol level. You also should eat a variety of foods, including:  Protein, such as meat, poultry, fish, tofu, nuts, and seeds (lean animal proteins are best).  Fruits.  Vegetables.  Dairy products, such as milk, cheese, and yogurt (low fat is best).  Breads, grains, pasta, cereal, rice, and  beans.  Fats such as olive oil, trans fat-free margarine, canola oil, avocado, and olives. DOES EVERYONE WITH DIABETES MELLITUS HAVE THE SAME MEAL PLAN? Because every person with diabetes mellitus is different, there is not one meal plan that works for everyone. It is very important that you meet with a dietitian who will help you create a meal plan that is just right for you. Document Released: 12/31/2004 Document Revised: 04/10/2013 Document Reviewed: 03/02/2013 [facility] Patient Information 2015 [place], [place]. This information is not intended to replace advice given to you by your health care provider. Make sure you discuss any questions you have with your health care provider.  Her diabetes severely uncontrolled. We are going to have to watch her very closely over the next few months. I am going to start another medication called metformin that you will need to take, this may cause diarrhea or upset stomach into your body gets used to it.  I want you to check your blood sugars every time he wakes up, before you eat or drink anything. I also want you to take your blood sugar before each meal. Please write these down in the log book given to you today. You will need to start watching your food intake and follow a diabetic diet. I recommend that you see our nutritionist for diabetes education. We can set that up here at our clinic if you're interested.

## 2013-10-25 NOTE — Assessment & Plan Note (Signed)
A1c today >14.  Pt not taking medication as given per ED. Continue Glipizide and start metformin.  Discussion on possible need of insulin, pt resistant at this time.  AVS on diabetic diet. May need education through nutrition, but currently declines.  Recommended exercise >150  Minutes per week and diabetic diet.  Lengthy discussion on small vessel disease and affects of uncontrolled diabetes on the body.  Diabetic log book given, pt to check fasting and prior to each meal.  Will need to schedule eye exam on next visit and perform foot exam.

## 2013-10-26 ENCOUNTER — Encounter: Payer: Self-pay | Admitting: Family Medicine

## 2013-10-26 ENCOUNTER — Telehealth: Payer: Self-pay | Admitting: Family Medicine

## 2013-10-26 DIAGNOSIS — E785 Hyperlipidemia, unspecified: Secondary | ICD-10-CM

## 2013-10-26 DIAGNOSIS — E1169 Type 2 diabetes mellitus with other specified complication: Secondary | ICD-10-CM

## 2013-10-26 HISTORY — DX: Hyperlipidemia, unspecified: E78.5

## 2013-10-26 HISTORY — DX: Hyperlipidemia, unspecified: E11.69

## 2013-10-26 MED ORDER — ATORVASTATIN CALCIUM 40 MG PO TABS: 40.0000 mg | ORAL_TABLET | Freq: Every day | ORAL | Status: DC

## 2013-10-26 NOTE — Telephone Encounter (Signed)
Left detailed message on voicemail.Ayiana Winslett, Lewie Loron

## 2013-10-26 NOTE — Assessment & Plan Note (Signed)
Started atrov 42

## 2013-10-26 NOTE — Telephone Encounter (Signed)
Please inform pt that her cholesterol level was rather high and I would like to start her on a medication that will help protect her heart and vessels from the high cholesterol. In addition she needs to continue to watch her diet as we discussed. I will call in the medication for her and she will need to follow in 4 weeks to discuss, at that time I will need to get additional labs. I will send her labs to her via mail for her files. Thanks.

## 2013-10-26 NOTE — Progress Notes (Signed)
   Subjective:    Patient ID: Andrea Glenn, female    DOB: 11/14/1960, 53 y.o.   MRN: [id number]  HPI KEVIA LOBATOS is a 53 y.o. female presents to [facility] for establishment of care. She use to be a pt of this clinic, but it has been some years.  Diabetes: Patient states she was diagnosed with diabetes 2 years ago and has not been on medications. She has been seen in the ED recently with high BG in the 400s. They placed her on glipizide, which she states she has only used intermittently. She is not watching her diet and she does exercise regularly. She has endorsed dizziness and numbness tingling in her feet bilaterally. She states she checks her sugars AFTER she eats. She has a meter and supplies at home. She reports they are never below 200.  Tobacco abuse: patient reports at least a 36 year pack year history. She smokes [place] [employer] 100s. Her smoking habits have varied over the years and currently she is cutting back because the new company that took over [employer] will no longer supply free cigarettes to the employees. She rates her desire to quit a 5/10 and her confidence a 10/10. She states she will not pay for cigs and she has seen what it has done to her friend that now has end stage COPD. She denies any shortness of breath and states she is ready to quit as soon as this carton is finished.   Health Maintenance: pt is in need of a colonoscopy. Her last mammogram was two years ago and normal. She is UTD with flu shot. Tetanus (2010).    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 Lives with her 88 year old grandson, exercise regularly. Denies ETOH or recreational drugs. Smokes for years (1979). Works in a smoking environment. Feels safe in her relationship and negative depression screen. Last recorded PAP in 2005.   Surgeries: C-section 1979, Arthroscopy shoulder bilateral 2000. Review of Systems Per hpi      Objective:   Physical Exam BP 98/62  Pulse 87  Ht 5\' 1"  (1.549 m)  Wt 125 lb 1.6 oz (56.745 kg)  BMI 23.65 kg/m2 Gen: Pleasant AAF, thin. NAD, Nontoxic in appearance. Talkative.  HEENT: AT. [place]. Bilateral TM visualized and normal in appearance. Bilateral eyes with mild  Injections, Np icterus. MMM.  CV: RRR, no murmur appreciated Chest: CTAB, no wheeze or crackles Abd: Soft. NTND. BS Present. No Masses palpated.  Ext: No erythema. No edema.  Skin: No  rashes, purpura or petechiae.  Neuro: Normal gait. PERLA. EOMi. Alert. Grossly intact.  Psych: Normal affect. Normal mood. Normal speech.

## 2014-01-13 ENCOUNTER — Other Ambulatory Visit: Payer: Self-pay | Admitting: Family Medicine

## 2015-06-12 ENCOUNTER — Encounter (HOSPITAL_COMMUNITY): Payer: Self-pay | Admitting: Emergency Medicine

## 2015-06-12 ENCOUNTER — Emergency Department (HOSPITAL_COMMUNITY)
Admission: EM | Admit: 2015-06-12 | Discharge: 2015-06-13 | Disposition: A | Discharge status: Home / Self Care | Payer: Self-pay | Attending: Emergency Medicine | Admitting: Emergency Medicine

## 2015-06-12 DIAGNOSIS — F17200 Nicotine dependence, unspecified, uncomplicated: Secondary | ICD-10-CM | POA: Insufficient documentation

## 2015-06-12 DIAGNOSIS — E1165 Type 2 diabetes mellitus with hyperglycemia: Secondary | ICD-10-CM | POA: Insufficient documentation

## 2015-06-12 DIAGNOSIS — Z79899 Other long term (current) drug therapy: Secondary | ICD-10-CM | POA: Insufficient documentation

## 2015-06-12 DIAGNOSIS — R739 Hyperglycemia, unspecified: Secondary | ICD-10-CM

## 2015-06-12 DIAGNOSIS — R2243 Localized swelling, mass and lump, lower limb, bilateral: Secondary | ICD-10-CM | POA: Insufficient documentation

## 2015-06-12 NOTE — ED Notes (Signed)
Pt states she has swelling in her feet  Pt states it started about a week ago then it went down  Pt states she noticed this morning they were swollen again  Pt states she has had them elevated today  Pt states she has pain running from her feet up to about mid lower leg on both sides  Pt states she feels like her sugar may be elevated as well

## 2015-06-13 LAB — CBG MONITORING, ED
Glucose-Capillary: 322 mg/dL — ABNORMAL HIGH (ref 65–99)
Glucose-Capillary: 443 mg/dL — ABNORMAL HIGH (ref 65–99)
Glucose-Capillary: 599 mg/dL (ref 65–99)

## 2015-06-13 LAB — URINALYSIS, ROUTINE W REFLEX MICROSCOPIC
Bilirubin Urine: NEGATIVE
Glucose, UA: >1000 mg/dL — AB
Ketones, ur: NEGATIVE mg/dL
Leukocytes, UA: NEGATIVE
Nitrite: NEGATIVE
Protein, ur: NEGATIVE mg/dL
Specific Gravity, Urine: 1.037 — ABNORMAL HIGH (ref 1.005–1.030)
pH: 5.5 (ref 5.0–8.0)

## 2015-06-13 LAB — BASIC METABOLIC PANEL
Anion gap: 9 (ref 5–15)
BUN: 26 mg/dL — ABNORMAL HIGH (ref 6–20)
CO2: 25 mmol/L (ref 22–32)
Calcium: 9.2 mg/dL (ref 8.9–10.3)
Chloride: 93 mmol/L — ABNORMAL LOW (ref 101–111)
Creatinine, Ser: 1.12 mg/dL — ABNORMAL HIGH (ref 0.44–1.00)
GFR calc Af Amer: >60 mL/min (ref >60)
GFR calc non Af Amer: 54 mL/min — ABNORMAL LOW (ref >60)
Glucose, Bld: 694 mg/dL (ref 65–99)
Potassium: 4.6 mmol/L (ref 3.5–5.1)
Sodium: 127 mmol/L — ABNORMAL LOW (ref 135–145)

## 2015-06-13 LAB — CBC WITH DIFFERENTIAL/PLATELET
Basophils Absolute: 0.1 10*3/uL (ref 0.0–0.1)
Basophils Relative: 1 %
Eosinophils Absolute: 0.2 10*3/uL (ref 0.0–0.7)
Eosinophils Relative: 2 %
HCT: 38.9 % (ref 36.0–46.0)
Hemoglobin: 12.7 g/dL (ref 12.0–15.0)
Lymphocytes Relative: 49 %
Lymphs Abs: 5 10*3/uL — ABNORMAL HIGH (ref 0.7–4.0)
MCH: 28.1 pg (ref 26.0–34.0)
MCHC: 32.6 g/dL (ref 30.0–36.0)
MCV: 86.1 fL (ref 78.0–100.0)
Monocytes Absolute: 0.4 10*3/uL (ref 0.1–1.0)
Monocytes Relative: 4 %
Neutro Abs: 4.4 10*3/uL (ref 1.7–7.7)
Neutrophils Relative %: 44 %
Platelets: 179 10*3/uL (ref 150–400)
RBC: 4.52 MIL/uL (ref 3.87–5.11)
RDW: 13.1 % (ref 11.5–15.5)
WBC: 10 10*3/uL (ref 4.0–10.5)

## 2015-06-13 LAB — URINE MICROSCOPIC-ADD ON

## 2015-06-13 MED ORDER — INSULIN ASPART 100 UNIT/ML ~~LOC~~ SOLN
6.0000 [IU] | Freq: Once | SUBCUTANEOUS | Status: AC
  Administered 2015-06-13: 6 [IU] via INTRAVENOUS
  Filled 2015-06-13: qty 1

## 2015-06-13 MED ORDER — INSULIN ASPART 100 UNIT/ML ~~LOC~~ SOLN
10.0000 [IU] | Freq: Once | SUBCUTANEOUS | Status: AC
  Administered 2015-06-13: 10 [IU] via INTRAVENOUS
  Filled 2015-06-13: qty 1

## 2015-06-13 MED ORDER — GLIMEPIRIDE 2 MG PO TABS: 2.0000 mg | ORAL_TABLET | Freq: Every day | ORAL | Status: DC

## 2015-06-13 MED ORDER — SODIUM CHLORIDE 0.9 % IV BOLUS (SEPSIS)
2000.0000 mL | Freq: Once | INTRAVENOUS | Status: AC
  Administered 2015-06-13: 2000 mL via INTRAVENOUS

## 2015-06-13 NOTE — ED Provider Notes (Addendum)
CSN: [id number]     Arrival date & time 06/12/15  2346 History  By signing my name below, I, Helane Gunther, attest that this documentation has been prepared under the direction and in the presence of Virgel Manifold, MD. Electronically Signed: Helane Gunther, ED Scribe. 06/13/2015. 2:29 AM.      Chief Complaint  Patient presents with  . Leg Swelling  . Hyperglycemia   The history is provided by the patient. No language interpreter was used.   HPI Comments: Andrea Glenn is a 55 y.o. female smoker with a PMHx of DM and a PSHx of foot surgery who presents to the Emergency Department complaining of bilateral foot swelling and hyperglycemia onset this morning. Pt states she had similar swelling to both feet one week ago, which then resolved, only to return this morning. She reports associated bilateral foot pain in the balls of her feet. Also endorses increased urination, fatigue, and dry mouth. She states the highest she ever measure her blood sugar was 400, but notes that she "cheated" on her diet today. She notes she was prescribed metformin, but that she has not been taking it for the past month because "it makes me sick." She notes a PMHx of neuropathy. She also states that she is very stressed due to multiple family members (parents and siblings) with severe medical issues, all of whom she has been taking care of for the past 2-3 years in addition to working full-time. Pt denies nausea and blurred vision.   Past Medical History  Diagnosis Date  . Diabetes mellitus without complication [facility])    Past Surgical History  Procedure Laterality Date  . Shoulder surgery    . Foot surgery    . Back surgery    . Cesarean section     Family History  Problem Relation Age of Onset  . Kidney failure Mother   . Diabetes Mother   . Thyroid disease Mother   . Hypertension Mother   . Heart failure Mother   . Kidney failure Father   . Cancer Sister   . Stroke Brother   . Cancer Other    Social  History  Substance Use Topics  . Smoking status: Current Every Day Smoker  . Smokeless tobacco: None  . Alcohol Use: No   OB History    No data available     Review of Systems  Constitutional: Positive for fatigue.  Eyes: Negative for visual disturbance.  Cardiovascular: Positive for leg swelling.  Gastrointestinal: Negative for nausea.  Genitourinary: Positive for frequency.  Musculoskeletal: Positive for myalgias and arthralgias.  All other systems reviewed and are negative.   Allergies  Review of patient's allergies indicates no known allergies.  Home Medications   Prior to Admission medications   Medication Sig Start Date End Date Taking? Authorizing Provider  acetaminophen (TYLENOL) 500 MG tablet Take 1,000 mg by mouth every 6 (six) hours as needed for mild pain.   Yes Historical Provider, MD  B Complex-C (B-COMPLEX WITH VITAMIN C) tablet Take 1 tablet by mouth daily.   Yes Historical Provider, MD  ferrous sulfate 325 (65 FE) MG tablet Take 325 mg by mouth daily with breakfast.   Yes Historical Provider, MD  OVER THE COUNTER MEDICATION Take 1 tablet by mouth daily.   Yes Historical Provider, MD  prenatal vitamin w/FE, FA (NATACHEW) 29-1 MG CHEW chewable tablet Chew 1 tablet by mouth daily at 12 noon.   Yes Historical Provider, MD  vitamin B-12 (CYANOCOBALAMIN) 1000 MCG  tablet Take 1,000 mcg by mouth daily.   Yes Historical Provider, MD  amitriptyline (ELAVIL) 25 MG tablet Take 1 tablet (25 mg total) by mouth at bedtime. Patient not taking: Reported on 06/13/2015 10/25/13   Renee A Kuneff, DO  atorvastatin (LIPITOR) 40 MG tablet Take 1 tablet (40 mg total) by mouth at bedtime. Patient not taking: Reported on 06/13/2015 10/26/13   Renee A Kuneff, DO  glipiZIDE (GLUCOTROL) 10 MG tablet Take 1 tablet (10 mg total) by mouth daily before breakfast. Patient not taking: Reported on 06/13/2015 10/25/13   Renee A Kuneff, DO  metFORMIN (GLUCOPHAGE) 500 MG tablet Take 1 tablet (500 mg total)  by mouth 2 (two) times daily with a meal. Patient not taking: Reported on 06/13/2015 10/25/13   Renee A Kuneff, DO  nicotine (NICODERM CQ) 7 mg/24hr patch Place 1 patch (7 mg total) onto the skin daily. Patient not taking: Reported on 06/13/2015 10/25/13   Renee A Kuneff, DO   BP 155/91 mmHg  Pulse 94  Temp(Src) 98.3 F (36.8 C) (Oral)  Resp 16  SpO2 98% Physical Exam  Constitutional: She is oriented to person, place, and time. She appears well-developed and well-nourished. No distress.  HENT:  Head: Normocephalic and atraumatic.  Mouth/Throat: Oropharynx is clear and moist. No oropharyngeal exudate.  Eyes: Conjunctivae and EOM are normal. Pupils are equal, round, and reactive to light. Right eye exhibits no discharge. Left eye exhibits no discharge. No scleral icterus.  Neck: Normal range of motion. Neck supple. No JVD present. No thyromegaly present.  Cardiovascular: Normal rate, regular rhythm, normal heart sounds and intact distal pulses.  Exam reveals no gallop and no friction rub.   No murmur heard. Pulmonary/Chest: Effort normal and breath sounds normal. No respiratory distress. She has no wheezes. She has no rales.  Abdominal: Soft. Bowel sounds are normal. She exhibits no distension and no mass. There is no tenderness.  Musculoskeletal: Normal range of motion. She exhibits no edema or tenderness.  Lymphadenopathy:    She has no cervical adenopathy.  Neurological: She is alert and oriented to person, place, and time. Coordination normal.  Skin: Skin is warm and dry. No rash noted. She is not diaphoretic. No erythema.  Psychiatric: She has a normal mood and affect. Her behavior is normal.  Nursing note and vitals reviewed.   ED Course  Procedures  DIAGNOSTIC STUDIES: Oxygen Saturation is 100% on RA, normal by my interpretation.    COORDINATION OF CARE: 1:10 AM - Discussed plans to order IV fluids and lower pt's blood sugar. Advised pt to consult with PCP about changing to  another medication due to the side effects of the metformin. Pt advised of plan for treatment and pt agrees.  1:56 AM - Discussed plans to admit pt to the hospital for hyperglycemia. Pt advised of plan for treatment and pt agrees.   Labs Review Labs Reviewed  URINALYSIS, ROUTINE W REFLEX MICROSCOPIC (NOT AT [facility]) - Abnormal; Notable for the following:    APPearance CLOUDY (*)    Specific Gravity, Urine 1.037 (*)    Glucose, UA >1000 (*)    Hgb urine dipstick TRACE (*)    All other components within normal limits  BASIC METABOLIC PANEL - Abnormal; Notable for the following:    Sodium 127 (*)    Chloride 93 (*)    Glucose, Bld 694 (*)    BUN 26 (*)    Creatinine, Ser 1.12 (*)    GFR calc non Af Amer 54 (*)  All other components within normal limits  CBC WITH DIFFERENTIAL/PLATELET - Abnormal; Notable for the following:    Lymphs Abs 5.0 (*)    All other components within normal limits  URINE MICROSCOPIC-ADD ON - Abnormal; Notable for the following:    Squamous Epithelial / LPF 6-30 (*)    Bacteria, UA RARE (*)    All other components within normal limits  CBG MONITORING, ED - Abnormal; Notable for the following:    Glucose-Capillary 599 (*)    All other components within normal limits  CBG MONITORING, ED - Abnormal; Notable for the following:    Glucose-Capillary 443 (*)    All other components within normal limits  HEMOGLOBIN A1C    Imaging Review No results found. I have personally reviewed and evaluated these images and lab results as part of my medical decision-making.   EKG Interpretation None      MDM   Final diagnoses:  Hyperglycemia   54y female with hyperglycemia. She is not in DKA. Glucose improved with IV fluids and insulin. Supposed to take metformin but not compliant. Advised the need to be. Prescription for low dose amaryl. Advised to take after eating in morning. Needs to follow-up with PCP for further medication management.  Virgel Manifold,  MD 06/22/15 1702  I personally preformed the services scribed in my presence. The recorded information has been reviewed is accurate. Virgel Manifold, MD.   Virgel Manifold, MD 06/22/15 [phone]

## 2015-06-13 NOTE — ED Notes (Signed)
Pt was incontinent of urine; pt ambulated to bathroom and was given clean brief and washcloth; bed linen changed

## 2015-06-13 NOTE — Discharge Instructions (Signed)
Hyperglycemia °Hyperglycemia occurs when the glucose (sugar) in your blood is too high. Hyperglycemia can happen for many reasons, but it most often happens to people who do not know they have diabetes or are not managing their diabetes properly.  °CAUSES  °Whether you have diabetes or not, there are other causes of hyperglycemia. Hyperglycemia can occur when you have diabetes, but it can also occur in other situations that you might not be as aware of, such as: °Diabetes °· If you have diabetes and are having problems controlling your blood glucose, hyperglycemia could occur because of some of the following reasons: °· Not following your meal plan. °· Not taking your diabetes medications or not taking it properly. °· Exercising less or doing less activity than you normally do. °· Being sick. °Pre-diabetes °· This cannot be ignored. Before people develop Type 2 diabetes, they almost always have "pre-diabetes." This is when your blood glucose levels are higher than normal, but not yet high enough to be diagnosed as diabetes. Research has shown that some long-term damage to the body, especially the heart and circulatory system, may already be occurring during pre-diabetes. If you take action to manage your blood glucose when you have pre-diabetes, you may delay or prevent Type 2 diabetes from developing. °Stress °· If you have diabetes, you may be "diet" controlled or on oral medications or insulin to control your diabetes. However, you may find that your blood glucose is higher than usual in the hospital whether you have diabetes or not. This is often referred to as "stress hyperglycemia." Stress can elevate your blood glucose. This happens because of hormones put out by the body during times of stress. If stress has been the cause of your high blood glucose, it can be followed regularly by your caregiver. That way he/she can make sure your hyperglycemia does not continue to get worse or progress to  diabetes. °Steroids °· Steroids are medications that act on the infection fighting system (immune system) to block inflammation or infection. One side effect can be a rise in blood glucose. Most people can produce enough extra insulin to allow for this rise, but for those who cannot, steroids make blood glucose levels go even higher. It is not unusual for steroid treatments to "uncover" diabetes that is developing. It is not always possible to determine if the hyperglycemia will go away after the steroids are stopped. A special blood test called an A1c is sometimes done to determine if your blood glucose was elevated before the steroids were started. °SYMPTOMS °· Thirsty. °· Frequent urination. °· Dry mouth. °· Blurred vision. °· Tired or fatigue. °· Weakness. °· Sleepy. °· Tingling in feet or leg. °DIAGNOSIS  °Diagnosis is made by monitoring blood glucose in one or all of the following ways: °· A1c test. This is a chemical found in your blood. °· Fingerstick blood glucose monitoring. °· Laboratory results. °TREATMENT  °First, knowing the cause of the hyperglycemia is important before the hyperglycemia can be treated. Treatment may include, but is not be limited to: °· Education. °· Change or adjustment in medications. °· Change or adjustment in meal plan. °· Treatment for an illness, infection, etc. °· More frequent blood glucose monitoring. °· Change in exercise plan. °· Decreasing or stopping steroids. °· Lifestyle changes. °HOME CARE INSTRUCTIONS  °· Test your blood glucose as directed. °· Exercise regularly. Your caregiver will give you instructions about exercise. Pre-diabetes or diabetes which comes on with stress is helped by exercising. °· Eat wholesome,   balanced meals. Eat often and at regular, fixed times. Your caregiver or nutritionist will give you a meal plan to guide your sugar intake.  Being at an ideal weight is important. If needed, losing as little as 10 to 15 pounds may help improve blood  glucose levels. SEEK MEDICAL CARE IF:   You have questions about medicine, activity, or diet.  You continue to have symptoms (problems such as increased thirst, urination, or weight gain). SEEK IMMEDIATE MEDICAL CARE IF:   You are vomiting or have diarrhea.  Your breath smells fruity.  You are breathing faster or slower.  You are very sleepy or incoherent.  You have numbness, tingling, or pain in your feet or hands.  You have chest pain.  Your symptoms get worse even though you have been following your caregiver's orders.  If you have any other questions or concerns.   This information is not intended to replace advice given to you by your health care provider. Make sure you discuss any questions you have with your health care provider.   Document Released: 09/29/2000 Document Revised: 06/28/2011 Document Reviewed: 12/10/2014 Elsevier Interactive Patient Education 2016 [employer]  Blood Glucose Monitoring, Adult Monitoring your blood glucose (also know as blood sugar) helps you to manage your diabetes. It also helps you and your health care provider monitor your diabetes and determine how well your treatment plan is working. WHY SHOULD YOU MONITOR YOUR BLOOD GLUCOSE?  It can help you understand how food, exercise, and medicine affect your blood glucose.  It allows you to know what your blood glucose is at any given moment. You can quickly tell if you are having low blood glucose (hypoglycemia) or high blood glucose (hyperglycemia).  It can help you and your health care provider know how to adjust your medicines.  It can help you understand how to manage an illness or adjust medicine for exercise. WHEN SHOULD YOU TEST? Your health care provider will help you decide how often you should check your blood glucose. This may depend on the type of diabetes you have, your diabetes control, or the types of medicines you are taking. Be sure to write down all of your blood glucose  readings so that this information can be reviewed with your health care provider. See below for examples of testing times that your health care provider may suggest. Type 1 Diabetes  Test at least 2 times per day if your diabetes is well controlled, if you are using an insulin pump, or if you perform multiple daily injections.  If your diabetes is not well controlled or if you are sick, you may need to test more often.  It is a good idea to also test:  Before every insulin injection.  Before and after exercise.  Between meals and 2 hours after a meal.  Occasionally between 2:00 a.m. and 3:00 a.m. Type 2 Diabetes  If you are taking insulin, test at least 2 times per day. However, it is best to test before every insulin injection.  If you take medicines by mouth (orally), test 2 times a day.  If you are on a controlled diet, test once a day.  If your diabetes is not well controlled or if you are sick, you may need to monitor more often. HOW TO MONITOR YOUR BLOOD GLUCOSE Supplies Needed  Blood glucose meter.  Test strips for your meter. Each meter has its own strips. You must use the strips that go with your own meter.  A  pricking needle (lancet).  A device that holds the lancet (lancing device).  A journal or log book to write down your results. Procedure  Wash your hands with soap and water. Alcohol is not preferred.  Prick the side of your finger (not the tip) with the lancet.  Gently milk the finger until a small drop of blood appears.  Follow the instructions that come with your meter for inserting the test strip, applying blood to the strip, and using your blood glucose meter. Other Areas to Get Blood for Testing Some meters allow you to use other areas of your body (other than your finger) to test your blood. These areas are called alternative sites. The most common alternative sites are:  The forearm.  The thigh.  The back area of the lower leg.  The palm  of the hand. The blood flow in these areas is slower. Therefore, the blood glucose values you get may be delayed, and the numbers are different from what you would get from your fingers. Do not use alternative sites if you think you are having hypoglycemia. Your reading will not be accurate. Always use a finger if you are having hypoglycemia. Also, if you cannot feel your lows (hypoglycemia unawareness), always use your fingers for your blood glucose checks. ADDITIONAL TIPS FOR GLUCOSE MONITORING  Do not reuse lancets.  Always carry your supplies with you.  All blood glucose meters have a 24-hour "hotline" number to call if you have questions or need help.  Adjust (calibrate) your blood glucose meter with a control solution after finishing a few boxes of strips. BLOOD GLUCOSE RECORD KEEPING It is a good idea to keep a daily record or log of your blood glucose readings. Most glucose meters, if not all, keep your glucose records stored in the meter. Some meters come with the ability to download your records to your home computer. Keeping a record of your blood glucose readings is especially helpful if you are wanting to look for patterns. Make notes to go along with the blood glucose readings because you might forget what happened at that exact time. Keeping good records helps you and your health care provider to work together to achieve good diabetes management.    This information is not intended to replace advice given to you by your health care provider. Make sure you discuss any questions you have with your health care provider.   Document Released: 04/08/2003 Document Revised: 04/26/2014 Document Reviewed: 08/28/2012 Elsevier Interactive Patient Education [employer].

## 2015-06-14 LAB — HEMOGLOBIN A1C
Hgb A1c MFr Bld: >16.7 % — ABNORMAL HIGH (ref 4.8–5.6)
Mean Plasma Glucose: >433 mg/dL

## 2016-01-23 ENCOUNTER — Ambulatory Visit (INDEPENDENT_AMBULATORY_CARE_PROVIDER_SITE_OTHER): Payer: Self-pay | Admitting: Family Medicine

## 2016-01-23 ENCOUNTER — Encounter: Payer: Self-pay | Admitting: Family Medicine

## 2016-01-23 DIAGNOSIS — E119 Type 2 diabetes mellitus without complications: Secondary | ICD-10-CM

## 2016-01-23 DIAGNOSIS — F17200 Nicotine dependence, unspecified, uncomplicated: Secondary | ICD-10-CM

## 2016-01-23 DIAGNOSIS — E782 Mixed hyperlipidemia: Secondary | ICD-10-CM

## 2016-01-23 DIAGNOSIS — E1143 Type 2 diabetes mellitus with diabetic autonomic (poly)neuropathy: Secondary | ICD-10-CM

## 2016-01-23 MED ORDER — ATORVASTATIN CALCIUM 40 MG PO TABS: 40.0000 mg | ORAL_TABLET | Freq: Every day | ORAL | 11 refills | Status: DC

## 2016-01-23 MED ORDER — GLIMEPIRIDE 4 MG PO TABS: 4.0000 mg | ORAL_TABLET | Freq: Every day | ORAL | 2 refills | Status: DC

## 2016-01-23 MED ORDER — AMITRIPTYLINE HCL 25 MG PO TABS: 25.0000 mg | ORAL_TABLET | Freq: Every day | ORAL | 2 refills | Status: DC

## 2016-01-23 NOTE — Assessment & Plan Note (Deleted)
A 

## 2016-01-23 NOTE — Patient Instructions (Signed)
It was a pleasure to meet you today. Please see below to review our plan for today's visit.  1. I have sent in your medications to [facility] on [facility]. Please take them as prescribed. These should be affordable. 2. Please complete you orange card information so we can get you access to more health care at an affordable price. This is important to get done soon. 3. Your diabetes is poorly controlled. I will continue your Amaryl, but you will likely need insulin in the future. We can discuss this during you next visit. 4. You are doing a good job at caring for your family and maintaining time for yourself. Please let us know if you have depressive feelings. We have resources to help.  5. Decrease you cigarette use please. This will greatly impact your future health. 6. I will see you again in 1 month.   Please call the clinic at [phone] if your symptoms worsen or you have any concerns. It was my pleasure to see you. -- Harriet Butte, [place], PGY-1  Glimepiride tablets What is this medicine? GLIMEPIRIDE (GLYE me pye ride) helps to treat type 2 diabetes. Treatment is combined with diet and exercise. This medicine helps your body use insulin better. This medicine may be used for other purposes; ask your health care provider or pharmacist if you have questions. What should I tell my health care provider before I take this medicine? They need to know if you have any of these conditions: -diabetic ketoacidosis -glucose-6-phosphate dehydrogenase deficiency -heart disease -kidney disease -liver disease -severe infection or injury -thyroid disease -an unusual or allergic reaction to glimepiride, sulfa drugs, other medicines, foods, dyes, or preservatives -pregnancy or recent attempts to get pregnant -breast-feeding How should I use this medicine? Take this medicine by mouth. Swallow with a drink of water. Follow the directions on the prescription label. Take  your dose at the same time each day, with breakfast or your first large meal. Do not take more often than directed. Talk to your pediatrician regarding the use of this medicine in children. Special care may be needed. Elderly patients over 75 years old can have a stronger reaction and need a smaller dose. Overdosage: If you think you have taken too much of this medicine contact a poison control center or emergency room at once. NOTE: This medicine is only for you. Do not share this medicine with others. What if I miss a dose? If you miss a dose, take it as soon as you can. If it is almost time for your next dose, take only that dose. Do not take double or extra doses. What may interact with this medicine? -bosentan -chloramphenicol -cisapride -clarithromycin -medicines for fungal or yeast infections -metoclopramide -probenecid -warfarin Many medications may cause an increase or decrease in blood sugar, these include: -alcohol containing beverages -aspirin and aspirin-like drugs -chloramphenicol -chromium -female hormones, like estrogens or progestins and birth control pills -fluoxetine -heart medicines like disopyramide -isoniazid -female hormones or anabolic steroids -medicines called MAO Inhibitors like Nardil, Parnate, Marplan, Eldepryl -medicines for allergies, asthma, cold, or cough -medicines for mental problems -medicines for weight loss -niacin -NSAIDs, medicines for pain and inflammation, like ibuprofen or naproxen -pentamidine -phenytoin -probenecid -quinolone antibiotics like ciprofloxacin, levofloxacin, ofloxacin -some herbal dietary supplements -steroid medicines like prednisone or cortisone -thyroid medicine -water pills or diuretics This list may not describe all possible interactions. Give your health care provider a list of all the medicines, herbs, non-prescription drugs, or  dietary supplements you use. Also tell them if you smoke, drink alcohol, or use illegal  drugs. Some items may interact with your medicine. What should I watch for while using this medicine? Visit your doctor or health care professional for regular checks on your progress. A test called the HbA1C (A1C) will be monitored. This is a simple blood test. It measures your blood sugar control over the last 2 to 3 months. You will receive this test every 3 to 6 months. Learn how to check your blood sugar. Learn the symptoms of low and high blood sugar and how to manage them. Always carry a quick-source of sugar with you in case you have symptoms of low blood sugar. Examples include hard sugar candy or glucose tablets. Make sure others know that you can choke if you eat or drink when you develop serious symptoms of low blood sugar, such as seizures or unconsciousness. They must get medical help at once. Tell your doctor or health care professional if you have high blood sugar. You might need to change the dose of your medicine. If you are sick or exercising more than usual, you might need to change the dose of your medicine. Do not skip meals. Ask your doctor or health care professional if you should avoid alcohol. Many nonprescription cough and cold products contain sugar or alcohol. These can affect blood sugar. This medicine can make you more sensitive to the sun. Keep out of the sun. If you cannot avoid being in the sun, wear protective clothing and use sunscreen. Do not use sun lamps or tanning beds/booths. Wear a medical ID bracelet or chain, and carry a card that describes your disease and details of your medicine and dosage times. What side effects may I notice from receiving this medicine? Side effects that you should report to your doctor or health care professional as soon as possible: -allergic reactions like skin rash, itching or hives, swelling of the face, lips, or tongue -breathing problems -dark urine -fever, chills, sore throat -signs and symptoms of low blood sugar such as  feeling anxious, confusion, dizziness, increased hunger, unusually weak or tired, sweating, shakiness, cold, irritable, headache, blurred vision, fast heartbeat, loss of consciousness -unusual bleeding or bruising -yellowing of the eyes or skin Side effects that usually do not require medical attention (report to your doctor or health care professional if they continue or are bothersome): -diarrhea -dizziness -headache -heartburn -nausea -stomach gas This list may not describe all possible side effects. Call your doctor for medical advice about side effects. You may report side effects to FDA at 1-800-FDA-1088. Where should I keep my medicine? Keep out of the reach of children. Store at room temperature below 30 degrees C (86 degrees F). Throw away any unused medicine after the expiration date. NOTE: This sheet is a summary. It may not cover all possible information. If you have questions about this medicine, talk to your doctor, pharmacist, or health care provider.    2016, Elsevier/Gold Standard. (2012-07-19 14:29:47)

## 2016-01-23 NOTE — Assessment & Plan Note (Addendum)
Diagnosed in 2012. Patient denies history of insulin use. She is unsure of type. Would like to get c-peptide but patient w/o insurance. Last A1c uncontrolled at >16.7 on 05/2015. Will hold new A1c given it is likely unchanged and patient does not have insurance.  - Recheck A1c during next visit if has insurance - Lipid panel UTD, continue Atorvastatin 40 QD - Given Amaryl 4 g QD, will need insulin in future but patient reluctant right now - Educated on diet and exercise - Given glucose log, to take in morning before breakfast and 1-2 hrs after meals, may consider NPH 15 units total daily in future - F/u in 1 month

## 2016-01-23 NOTE — Progress Notes (Signed)
Subjective:   Patient ID: Andrea Glenn    DOB: 09-Sep-1960, 55 y.o. female   MRN: [id number]  CC: New patient encounter  HPI: Andrea Glenn is a 55 y.o. female who presents to clinic today to establish as a new patient. Problems discussed today are as follows:  1. Diabetes: was diagnosed in 2012. No history of insulin use. Used to take metformin but did not like side effects. Has tried Glipizide but likes Amaryl better. Stopped taking this since 07/2015 when medication ran out after given by ED for hyperglycemia hospitalization on 05/2015. Says she cannot afford medications given loss of insurance. Denies symptoms of polyphagia, polydipsia, polyuria.  2. Tobacco Abuse: smoking for over 40 years. Used to work in Company [employer]. Continues to smoke 10 cigs per day. Says she has quit before because of her friend who had COPD. Says she is willing to cut back. Denies cough or dyspnea.  3. Social: Under stress given sister with cervical cancer and caring for 47 year old grandson with down syndrome. She also has sisters with a recent MI and stroke. Having to care for parents too. Does not work. Needs health insurance. Has not received medical care since 2015.  ROS: See HPI for pertinent ROS.  [place]: Pertinent past medical, surgical, family, and social history were reviewed and updated as appropriate. Smoking status reviewed.  Medications reviewed. Current Outpatient Prescriptions  Medication Sig Dispense Refill  . acetaminophen (TYLENOL) 500 MG tablet Take 1,000 mg by mouth every 6 (six) hours as needed for mild pain.    Marland Kitchen amitriptyline (ELAVIL) 25 MG tablet Take 1 tablet (25 mg total) by mouth at bedtime. 30 tablet 2  . atorvastatin (LIPITOR) 40 MG tablet Take 1 tablet (40 mg total) by mouth at bedtime. 30 tablet 11  . B Complex-C (B-COMPLEX WITH VITAMIN C) tablet Take 1 tablet by mouth daily.    . ferrous sulfate 325 (65 FE) MG tablet Take 325 mg by mouth daily with breakfast.    .  glimepiride (AMARYL) 4 MG tablet Take 1 tablet (4 mg total) by mouth daily with breakfast. 30 tablet 2  . nicotine (NICODERM CQ) 7 mg/24hr patch Place 1 patch (7 mg total) onto the skin daily. (Patient not taking: Reported on 01/23/2016) 28 patch 0  . OVER THE COUNTER MEDICATION Take 1 tablet by mouth daily.    . prenatal vitamin w/FE, FA (NATACHEW) 29-1 MG CHEW chewable tablet Chew 1 tablet by mouth daily at 12 noon.    . vitamin B-12 (CYANOCOBALAMIN) 1000 MCG tablet Take 1,000 mcg by mouth daily.     No current facility-administered medications for this visit.     Objective:   BP 98/64   Pulse 87   Temp 97.8 F (36.6 C) (Oral)   Ht 5\' 4"  (1.626 m)   Wt 120 lb 12.8 oz (54.8 kg)   BMI 20.74 kg/m  Vitals and nursing note reviewed.  General: well nourished, well developed, in no acute distress with non-toxic appearance HEENT: normocephalic, atraumatic, moist mucous membranes Neck: supple, non-tender without lymphadenopathy CV: regular rate and rhythm without murmurs, rubs, or gallops Lungs: clear to auscultation bilaterally with normal work of breathing Abdomen: soft, non-tender, non-distended, no masses or organomegaly palpable, normoactive bowel sounds Skin: warm, dry, no rashes or lesions, cap refill < 2 seconds Extremities: warm and well perfused, normal tone  Assessment & Plan:   Diabetes ([place]) Diagnosed in 2012. Patient denies history of insulin use. She is unsure of  type. Would like to get c-peptide but patient w/o insurance. Last A1c uncontrolled at >16.7 on 05/2015. Will hold new A1c given it is likely unchanged and patient does not have insurance.  - Recheck A1c during next visit if has insurance - Lipid panel UTD, continue Atorvastatin 40 QD - Given Amaryl 4 g QD, will need insulin in future but patient reluctant right now - Educated on diet and exercise - Given glucose log, to take in morning before breakfast and 1-2 hrs after meals, may consider NPH 15 units total daily  in future - F/u in 1 month   TOBACCO DEPENDENCE Significant pack year history of 40 years. Continues to smoke 190 cigs per day. Says she does not have COPD, but I am worried she will likely develop this soon if not already. Patient under stress with dying sister due to cancer. Encouraged to cut back if possible, patient seems willing.  - Counseled on smoking cessation - Give information during next visit, can call QUIT-NOW  No orders of the defined types were placed in this encounter.  Meds ordered this encounter  Medications  . amitriptyline (ELAVIL) 25 MG tablet    Sig: Take 1 tablet (25 mg total) by mouth at bedtime.    Dispense:  30 tablet    Refill:  2  . atorvastatin (LIPITOR) 40 MG tablet    Sig: Take 1 tablet (40 mg total) by mouth at bedtime.    Dispense:  30 tablet    Refill:  11  . glimepiride (AMARYL) 4 MG tablet    Sig: Take 1 tablet (4 mg total) by mouth daily with breakfast.    Dispense:  30 tablet    Refill:  2    Harriet Butte, DO [place], PGY-1 01/23/2016 10:21 AM

## 2016-01-23 NOTE — Assessment & Plan Note (Addendum)
Significant pack year history of 40 years. Continues to smoke 190 cigs per day. Says she does not have COPD, but I am worried she will likely develop this soon if not already. Patient under stress with dying sister due to cancer. Encouraged to cut back if possible, patient seems willing.  - Counseled on smoking cessation - Give information during next visit, can call QUIT-NOW

## 2016-06-23 ENCOUNTER — Encounter (HOSPITAL_COMMUNITY): Payer: Self-pay

## 2016-06-23 ENCOUNTER — Emergency Department (HOSPITAL_COMMUNITY)
Admission: EM | Admit: 2016-06-23 | Discharge: 2016-06-23 | Disposition: A | Discharge status: Home / Self Care | Payer: No Typology Code available for payment source | Attending: Emergency Medicine | Admitting: Emergency Medicine

## 2016-06-23 DIAGNOSIS — E1140 Type 2 diabetes mellitus with diabetic neuropathy, unspecified: Secondary | ICD-10-CM | POA: Insufficient documentation

## 2016-06-23 DIAGNOSIS — S299XXA Unspecified injury of thorax, initial encounter: Secondary | ICD-10-CM | POA: Diagnosis present

## 2016-06-23 DIAGNOSIS — Z79899 Other long term (current) drug therapy: Secondary | ICD-10-CM | POA: Insufficient documentation

## 2016-06-23 DIAGNOSIS — Z7984 Long term (current) use of oral hypoglycemic drugs: Secondary | ICD-10-CM | POA: Insufficient documentation

## 2016-06-23 DIAGNOSIS — F17210 Nicotine dependence, cigarettes, uncomplicated: Secondary | ICD-10-CM | POA: Insufficient documentation

## 2016-06-23 DIAGNOSIS — Y92410 Unspecified street and highway as the place of occurrence of the external cause: Secondary | ICD-10-CM | POA: Diagnosis not present

## 2016-06-23 DIAGNOSIS — Y939 Activity, unspecified: Secondary | ICD-10-CM | POA: Insufficient documentation

## 2016-06-23 DIAGNOSIS — S39012A Strain of muscle, fascia and tendon of lower back, initial encounter: Secondary | ICD-10-CM

## 2016-06-23 DIAGNOSIS — S29012A Strain of muscle and tendon of back wall of thorax, initial encounter: Secondary | ICD-10-CM | POA: Diagnosis not present

## 2016-06-23 DIAGNOSIS — Y999 Unspecified external cause status: Secondary | ICD-10-CM | POA: Insufficient documentation

## 2016-06-23 MED ORDER — LIDOCAINE 5 % EX PTCH: 1.0000 | MEDICATED_PATCH | CUTANEOUS | 0 refills | Status: DC

## 2016-06-23 MED ORDER — METHOCARBAMOL 500 MG PO TABS: 500.0000 mg | ORAL_TABLET | Freq: Four times a day (QID) | ORAL | 0 refills | Status: DC | PRN
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
## 2016-06-23 NOTE — Discharge Instructions (Signed)
Read the information below.  Use the prescribed medication as directed.  Please discuss all new medications with your pharmacist.  You may return to the Emergency Department at any time for worsening condition or any new symptoms that concern you.    If you develop fevers, loss of control of bowel or bladder, weakness or numbness in your legs, or are unable to walk, return to the ER for a recheck.  °
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
## 2016-09-29 ENCOUNTER — Emergency Department (HOSPITAL_COMMUNITY)
Admission: EM | Admit: 2016-09-29 | Discharge: 2016-09-30 | Disposition: A | Discharge status: Home / Self Care | Payer: Self-pay | Attending: Emergency Medicine | Admitting: Emergency Medicine

## 2016-09-29 ENCOUNTER — Encounter (HOSPITAL_COMMUNITY): Payer: Self-pay | Admitting: Emergency Medicine

## 2016-09-29 DIAGNOSIS — R1013 Epigastric pain: Secondary | ICD-10-CM | POA: Insufficient documentation

## 2016-09-29 DIAGNOSIS — M549 Dorsalgia, unspecified: Secondary | ICD-10-CM | POA: Insufficient documentation

## 2016-09-29 DIAGNOSIS — E1165 Type 2 diabetes mellitus with hyperglycemia: Secondary | ICD-10-CM | POA: Insufficient documentation

## 2016-09-29 DIAGNOSIS — R739 Hyperglycemia, unspecified: Secondary | ICD-10-CM

## 2016-09-29 DIAGNOSIS — R1010 Upper abdominal pain, unspecified: Secondary | ICD-10-CM

## 2016-09-29 DIAGNOSIS — Z79899 Other long term (current) drug therapy: Secondary | ICD-10-CM | POA: Insufficient documentation

## 2016-09-29 DIAGNOSIS — F17210 Nicotine dependence, cigarettes, uncomplicated: Secondary | ICD-10-CM | POA: Insufficient documentation

## 2016-09-29 LAB — URINALYSIS, ROUTINE W REFLEX MICROSCOPIC
BILIRUBIN URINE: NEGATIVE
Ketones, ur: NEGATIVE mg/dL
LEUKOCYTES UA: NEGATIVE
NITRITE: POSITIVE — AB
PROTEIN: 100 mg/dL — AB
Specific Gravity, Urine: 1.024 (ref 1.005–1.030)
Squamous Epithelial / LPF: NONE SEEN
pH: 6 (ref 5.0–8.0)

## 2016-09-29 LAB — LIPASE, BLOOD: Lipase: 139 U/L — ABNORMAL HIGH (ref 11–51)

## 2016-09-29 LAB — COMPREHENSIVE METABOLIC PANEL
ALBUMIN: 3.3 g/dL — AB (ref 3.5–5.0)
ALT: 9 U/L — ABNORMAL LOW (ref 14–54)
ANION GAP: 8 (ref 5–15)
AST: 14 U/L — ABNORMAL LOW (ref 15–41)
Alkaline Phosphatase: 101 U/L (ref 38–126)
BILIRUBIN TOTAL: 0.5 mg/dL (ref 0.3–1.2)
BUN: 21 mg/dL — ABNORMAL HIGH (ref 6–20)
CALCIUM: 9.3 mg/dL (ref 8.9–10.3)
CO2: 28 mmol/L (ref 22–32)
Chloride: 95 mmol/L — ABNORMAL LOW (ref 101–111)
Creatinine, Ser: 1.05 mg/dL — ABNORMAL HIGH (ref 0.44–1.00)
GFR, EST NON AFRICAN AMERICAN: 58 mL/min — AB (ref >60)
Glucose, Bld: 576 mg/dL (ref 65–99)
POTASSIUM: 5.4 mmol/L — AB (ref 3.5–5.1)
Sodium: 131 mmol/L — ABNORMAL LOW (ref 135–145)
TOTAL PROTEIN: 7.2 g/dL (ref 6.5–8.1)

## 2016-09-29 LAB — CBC
HEMATOCRIT: 38 % (ref 36.0–46.0)
HEMOGLOBIN: 12.9 g/dL (ref 12.0–15.0)
MCH: 28 pg (ref 26.0–34.0)
MCHC: 33.9 g/dL (ref 30.0–36.0)
MCV: 82.6 fL (ref 78.0–100.0)
Platelets: 225 10*3/uL (ref 150–400)
RBC: 4.6 MIL/uL (ref 3.87–5.11)
RDW: 12.8 % (ref 11.5–15.5)
WBC: 9.8 10*3/uL (ref 4.0–10.5)

## 2016-09-29 LAB — CBG MONITORING, ED: Glucose-Capillary: 368 mg/dL — ABNORMAL HIGH (ref 65–99)

## 2016-09-29 MED ORDER — SODIUM CHLORIDE 0.9 % IV BOLUS (SEPSIS)
2000.0000 mL | Freq: Once | INTRAVENOUS | Status: AC
  Administered 2016-09-29: 2000 mL via INTRAVENOUS

## 2016-09-29 MED ORDER — ONDANSETRON HCL 4 MG/2ML IJ SOLN
4.0000 mg | Freq: Once | INTRAMUSCULAR | Status: AC
  Administered 2016-09-29: 4 mg via INTRAVENOUS
  Filled 2016-09-29: qty 2

## 2016-09-29 MED ORDER — SODIUM CHLORIDE 0.9 % IV BOLUS (SEPSIS)
1000.0000 mL | Freq: Once | INTRAVENOUS | Status: AC
  Administered 2016-09-29: 1000 mL via INTRAVENOUS

## 2016-09-29 MED ORDER — SODIUM CHLORIDE 0.9 % IV SOLN
INTRAVENOUS | Status: DC
  Administered 2016-09-29: 23:00:00 via INTRAVENOUS

## 2016-09-29 MED ORDER — HYDROCODONE-ACETAMINOPHEN 5-325 MG PO TABS: 1.0000 | ORAL_TABLET | ORAL | 0 refills | Status: DC | PRN

## 2016-09-29 MED ORDER — GLIMEPIRIDE 4 MG PO TABS: 4.0000 mg | ORAL_TABLET | Freq: Every day | ORAL | 2 refills | Status: DC

## 2016-09-29 MED ORDER — INSULIN ASPART 100 UNIT/ML ~~LOC~~ SOLN
10.0000 [IU] | Freq: Once | SUBCUTANEOUS | Status: AC
  Administered 2016-09-29: 10 [IU] via SUBCUTANEOUS
  Filled 2016-09-29: qty 1

## 2016-09-29 MED ORDER — MORPHINE SULFATE (PF) 2 MG/ML IV SOLN
4.0000 mg | Freq: Once | INTRAVENOUS | Status: AC
  Administered 2016-09-29: 4 mg via INTRAVENOUS
  Filled 2016-09-29: qty 2

## 2016-09-29 MED ORDER — PROMETHAZINE HCL 25 MG PO TABS: 25.0000 mg | ORAL_TABLET | Freq: Four times a day (QID) | ORAL | 0 refills | Status: DC | PRN

## 2016-09-29 NOTE — ED Provider Notes (Addendum)
[facility] DEPT Provider Note   CSN: [id number] Arrival date & time: 09/29/16  1943     History   Chief Complaint Chief Complaint  Patient presents with  . Back Pain  . Abdominal Pain    HPI Andrea Glenn is a 56 y.o. female.  56 year old female presents with epigastric abdominal pain radiating to her back 3 days. Pain has been persistent nothing makes it better. No treatment used for prior to arrival. No nausea vomiting or diarrhea. No fever or chills. Patient has history of diabetes but has run out of her medications. She endorses some polydipsia but no polyuria. Denies any urinary frequency or dysuria. Also notes worsening chronic back pain without radicular symptoms. Has history of restless leg syndrome as well which she states has been getting worse.      Past Medical History:  Diagnosis Date  . Diabetes mellitus without complication [facility])     Patient Active Problem List   Diagnosis Date Noted  . Hyperlipidemia 10/26/2013  . Diabetic neuropathy ([place]) 10/25/2013  . RESTLESS LEG SYNDROME, MILD 03/03/2010  . Diabetes ([place]) 06/16/2006  . TOBACCO DEPENDENCE 06/16/2006    Past Surgical History:  Procedure Laterality Date  . BACK SURGERY    . CESAREAN SECTION    . FOOT SURGERY    . SHOULDER SURGERY      OB History    No data available       Home Medications    Prior to Admission medications   Medication Sig Start Date End Date Taking? Authorizing Provider  amitriptyline (ELAVIL) 25 MG tablet Take 1 tablet (25 mg total) by mouth at bedtime. 01/23/16  Yes [place] Bing, DO  atorvastatin (LIPITOR) 40 MG tablet Take 1 tablet (40 mg total) by mouth at bedtime. 01/23/16  Yes Vincent Bing, DO  ferrous sulfate 325 (65 FE) MG tablet Take 325 mg by mouth daily with breakfast.   Yes [provider]  glimepiride (AMARYL) 4 MG tablet Take 1 tablet (4 mg total) by mouth daily with breakfast. 01/23/16  Yes Ravenel Bing, DO  methocarbamol  (ROBAXIN) 500 MG tablet Take 1-2 tablets (500-1,000 mg total) by mouth every 6 (six) hours as needed for muscle spasms. 06/23/16  Yes West, [place], PA-C  prenatal vitamin w/FE, FA (NATACHEW) 29-1 MG CHEW chewable tablet Chew 1 tablet by mouth daily at 12 noon.   Yes [provider]  vitamin B-12 (CYANOCOBALAMIN) 1000 MCG tablet Take 1,000 mcg by mouth daily.   Yes [provider]  lidocaine (LIDODERM) 5 % Place 1 patch onto the skin daily. Remove & Discard patch within 12 hours or as directed by MD 06/23/16   Clayton Bibles, PA-C  nicotine (NICODERM CQ) 7 mg/24hr patch Place 1 patch (7 mg total) onto the skin daily. Patient not taking: Reported on 01/23/2016 10/25/13   Ma Hillock, DO    Family History Family History  Problem Relation Age of Onset  . Kidney failure Mother   . Diabetes Mother   . Thyroid disease Mother   . Hypertension Mother   . Heart failure Mother   . Kidney failure Father   . Cancer Sister   . Stroke Brother   . Cancer Other     Social History Social History  Substance Use Topics  . Smoking status: Current Every Day Smoker    Packs/day: 0.50    Years: 20.00    Types: Cigarettes  . Smokeless tobacco: Never Used  . Alcohol use No  Allergies   Patient has no known allergies.   Review of Systems Review of Systems  All other systems reviewed and are negative.    Physical Exam Updated Vital Signs BP 105/75   Pulse 88   Temp 97.9 F (36.6 C) (Oral)   Resp 18   Ht 1.524 m (5')   Wt 55.8 kg (123 lb)   SpO2 97%   BMI 24.02 kg/m   Physical Exam  Constitutional: She is oriented to person, place, and time. She appears well-developed and well-nourished.  Non-toxic appearance. No distress.  HENT:  Head: Normocephalic and atraumatic.  Eyes: Conjunctivae, EOM and lids are normal. Pupils are equal, round, and reactive to light.  Neck: Normal range of motion. Neck supple. No tracheal deviation present. No thyroid mass present.    Cardiovascular: Normal rate, regular rhythm and normal heart sounds.  Exam reveals no gallop.   No murmur heard. Pulmonary/Chest: Effort normal and breath sounds normal. No stridor. No respiratory distress. She has no decreased breath sounds. She has no wheezes. She has no rhonchi. She has no rales.  Abdominal: Soft. Normal appearance and bowel sounds are normal. She exhibits no distension. There is no tenderness. There is no rigidity, no rebound, no guarding and no CVA tenderness.  Musculoskeletal: Normal range of motion. She exhibits no edema or tenderness.  Neurological: She is alert and oriented to person, place, and time. She has normal strength. No cranial nerve deficit or sensory deficit. GCS eye subscore is 4. GCS verbal subscore is 5. GCS motor subscore is 6.  Skin: Skin is warm and dry. No abrasion and no rash noted.  Psychiatric: She has a normal mood and affect. Her speech is normal and behavior is normal.  Nursing note and vitals reviewed.    ED Treatments / Results  Labs (all labs ordered are listed, but only abnormal results are displayed) Labs Reviewed  LIPASE, BLOOD - Abnormal; Notable for the following:       Result Value   Lipase 139 (*)    All other components within normal limits  COMPREHENSIVE METABOLIC PANEL - Abnormal; Notable for the following:    Sodium 131 (*)    Potassium 5.4 (*)    Chloride 95 (*)    Glucose, Bld 576 (*)    BUN 21 (*)    Creatinine, Ser 1.05 (*)    Albumin 3.3 (*)    AST 14 (*)    ALT 9 (*)    GFR calc non Af Amer 58 (*)    All other components within normal limits  URINALYSIS, ROUTINE W REFLEX MICROSCOPIC - Abnormal; Notable for the following:    Color, Urine AMBER (*)    Glucose, UA >=500 (*)    Hgb urine dipstick SMALL (*)    Protein, ur 100 (*)    Nitrite POSITIVE (*)    Bacteria, UA FEW (*)    All other components within normal limits  CBC    EKG  EKG Interpretation None       Radiology No results  found.  Procedures Procedures (including critical care time)  Medications Ordered in ED Medications  sodium chloride 0.9 % bolus 1,000 mL (1,000 mLs Intravenous New Bag/Given 09/29/16 2153)  sodium chloride 0.9 % bolus 2,000 mL (not administered)  0.9 %  sodium chloride infusion (not administered)  insulin aspart (novoLOG) injection 10 Units (not administered)  morphine 2 MG/ML injection 4 mg (not administered)  ondansetron (ZOFRAN) injection 4 mg (not administered)  Initial Impression / Assessment and Plan / ED Course  I have reviewed the triage vital signs and the nursing notes.  Pertinent labs & imaging results that were available during my care of the patient were reviewed by me and considered in my medical decision making (see chart for details).     Patient treated with IV fluids here as well as insulin. Repeat blood sugar 368. She has no signs of ketosis on her urinalysis. She does not have an increased anion gap. Has a mild elevation of her potassium of 5.4 which was treated with IV fluids and insulin. Does have a mild elevation in her lipase. She is asking for food at this time. She'll be discharged home with a refill of her diabetic medications as well as a prescription for nausea and pain medication. Will be given referral to the [facility]  Final Clinical Impressions(s) / ED Diagnoses   Final diagnoses:  None    New Prescriptions New Prescriptions   No medications on file     Lacretia Leigh, MD 09/29/16 2346    Lacretia Leigh, MD 09/29/16 2350

## 2016-09-29 NOTE — ED Triage Notes (Signed)
Pt is c/o lower back/flank pain and upper abd pain that started 3 days ago  Pt denies any N/V/D  Pt's initial blood pressure was 78/57  Rechecked after she sat a few minutes in triage 103/67

## 2016-09-29 NOTE — Discharge Instructions (Signed)
Use a liquid diet for the next 1 or 2 days. Then advance your diet as you tolerate. Return here for fever, severe pain, or any other problems. Take your diabetic medications as directed

## 2016-10-07 ENCOUNTER — Inpatient Hospital Stay (HOSPITAL_COMMUNITY)
Admission: EM | Admit: 2016-10-07 | Discharge: 2016-10-10 | DRG: 440 | Disposition: A | Discharge status: Home / Self Care | Payer: Self-pay | Attending: Family Medicine | Admitting: Family Medicine

## 2016-10-07 ENCOUNTER — Encounter (HOSPITAL_COMMUNITY): Payer: Self-pay

## 2016-10-07 ENCOUNTER — Emergency Department (HOSPITAL_COMMUNITY): Payer: Self-pay

## 2016-10-07 DIAGNOSIS — I959 Hypotension, unspecified: Secondary | ICD-10-CM | POA: Diagnosis present

## 2016-10-07 DIAGNOSIS — F17210 Nicotine dependence, cigarettes, uncomplicated: Secondary | ICD-10-CM | POA: Diagnosis present

## 2016-10-07 DIAGNOSIS — E11649 Type 2 diabetes mellitus with hypoglycemia without coma: Secondary | ICD-10-CM | POA: Diagnosis not present

## 2016-10-07 DIAGNOSIS — K859 Acute pancreatitis without necrosis or infection, unspecified: Principal | ICD-10-CM

## 2016-10-07 DIAGNOSIS — E118 Type 2 diabetes mellitus with unspecified complications: Secondary | ICD-10-CM

## 2016-10-07 DIAGNOSIS — Z823 Family history of stroke: Secondary | ICD-10-CM

## 2016-10-07 DIAGNOSIS — E1165 Type 2 diabetes mellitus with hyperglycemia: Secondary | ICD-10-CM | POA: Diagnosis present

## 2016-10-07 DIAGNOSIS — Z8349 Family history of other endocrine, nutritional and metabolic diseases: Secondary | ICD-10-CM

## 2016-10-07 DIAGNOSIS — Z841 Family history of disorders of kidney and ureter: Secondary | ICD-10-CM

## 2016-10-07 DIAGNOSIS — Z8249 Family history of ischemic heart disease and other diseases of the circulatory system: Secondary | ICD-10-CM

## 2016-10-07 DIAGNOSIS — E785 Hyperlipidemia, unspecified: Secondary | ICD-10-CM

## 2016-10-07 DIAGNOSIS — R823 Hemoglobinuria: Secondary | ICD-10-CM | POA: Diagnosis present

## 2016-10-07 DIAGNOSIS — Z833 Family history of diabetes mellitus: Secondary | ICD-10-CM

## 2016-10-07 DIAGNOSIS — E1140 Type 2 diabetes mellitus with diabetic neuropathy, unspecified: Secondary | ICD-10-CM | POA: Diagnosis present

## 2016-10-07 LAB — URINALYSIS, ROUTINE W REFLEX MICROSCOPIC
Bilirubin Urine: NEGATIVE
KETONES UR: NEGATIVE mg/dL
Leukocytes, UA: NEGATIVE
NITRITE: NEGATIVE
PROTEIN: 100 mg/dL — AB
Specific Gravity, Urine: 1.027 (ref 1.005–1.030)
pH: 5 (ref 5.0–8.0)

## 2016-10-07 LAB — CBC
HEMATOCRIT: 36.4 % (ref 36.0–46.0)
HEMOGLOBIN: 11.8 g/dL — AB (ref 12.0–15.0)
MCH: 27.5 pg (ref 26.0–34.0)
MCHC: 32.4 g/dL (ref 30.0–36.0)
MCV: 84.8 fL (ref 78.0–100.0)
Platelets: 219 10*3/uL (ref 150–400)
RBC: 4.29 MIL/uL (ref 3.87–5.11)
RDW: 12.9 % (ref 11.5–15.5)
WBC: 10 10*3/uL (ref 4.0–10.5)

## 2016-10-07 LAB — COMPREHENSIVE METABOLIC PANEL
ALBUMIN: 3 g/dL — AB (ref 3.5–5.0)
ALT: 9 U/L — ABNORMAL LOW (ref 14–54)
ANION GAP: 11 (ref 5–15)
AST: 16 U/L (ref 15–41)
Alkaline Phosphatase: 82 U/L (ref 38–126)
BILIRUBIN TOTAL: 0.3 mg/dL (ref 0.3–1.2)
BUN: 17 mg/dL (ref 6–20)
CHLORIDE: 98 mmol/L — AB (ref 101–111)
CO2: 25 mmol/L (ref 22–32)
Calcium: 9.4 mg/dL (ref 8.9–10.3)
Creatinine, Ser: 0.9 mg/dL (ref 0.44–1.00)
GFR calc Af Amer: >60 mL/min (ref >60)
GFR calc non Af Amer: >60 mL/min (ref >60)
GLUCOSE: 475 mg/dL — AB (ref 65–99)
POTASSIUM: 4 mmol/L (ref 3.5–5.1)
SODIUM: 134 mmol/L — AB (ref 135–145)
TOTAL PROTEIN: 6.9 g/dL (ref 6.5–8.1)

## 2016-10-07 LAB — LIPASE, BLOOD: Lipase: 129 U/L — ABNORMAL HIGH (ref 11–51)

## 2016-10-07 LAB — ETHANOL

## 2016-10-07 MED ORDER — SODIUM CHLORIDE 0.9 % IV BOLUS (SEPSIS)
1000.0000 mL | Freq: Once | INTRAVENOUS | Status: AC
  Administered 2016-10-07: 1000 mL via INTRAVENOUS

## 2016-10-07 MED ORDER — MORPHINE SULFATE (PF) 4 MG/ML IV SOLN
4.0000 mg | Freq: Once | INTRAVENOUS | Status: AC
  Administered 2016-10-07: 4 mg via INTRAVENOUS
  Filled 2016-10-07: qty 1

## 2016-10-07 MED ORDER — IOPAMIDOL (ISOVUE-300) INJECTION 61%
INTRAVENOUS | Status: AC
  Administered 2016-10-07: 100 mL via INTRAVENOUS
  Filled 2016-10-07: qty 100

## 2016-10-07 MED ORDER — ONDANSETRON HCL 4 MG/2ML IJ SOLN
4.0000 mg | Freq: Once | INTRAMUSCULAR | Status: AC
  Administered 2016-10-07: 4 mg via INTRAVENOUS
  Filled 2016-10-07: qty 2

## 2016-10-07 MED ORDER — INSULIN ASPART 100 UNIT/ML ~~LOC~~ SOLN
10.0000 [IU] | Freq: Once | SUBCUTANEOUS | Status: AC
  Administered 2016-10-07: 10 [IU] via SUBCUTANEOUS
  Filled 2016-10-07: qty 1

## 2016-10-07 NOTE — ED Triage Notes (Signed)
Pt reports back pain "its like its running down my spine and to my kidney area, the whole nine yards" and patient also reports pain to upper abdomen and nausea/vomiting. Emesis x 1 today per pt. She states she was at [facility] for same symptoms last week.

## 2016-10-07 NOTE — ED Provider Notes (Signed)
[facility] DEPT Provider Note   CSN: [id number] Arrival date & time: 10/07/16  0175     History   Chief Complaint Chief Complaint  Patient presents with  . Back Pain    HPI Andrea Glenn is a 56 y.o. female.  The history is provided by the patient and medical records. No language interpreter was used.  Abdominal Pain   Associated symptoms include nausea and vomiting. Pertinent negatives include fever, dysuria, hematuria and arthralgias.    Past Medical History:  Diagnosis Date  . Diabetes mellitus without complication [facility])     Patient Active Problem List   Diagnosis Date Noted  . Hyperlipidemia 10/26/2013  . Diabetic neuropathy ([place]) 10/25/2013  . RESTLESS LEG SYNDROME, MILD 03/03/2010  . Diabetes ([place]) 06/16/2006  . TOBACCO DEPENDENCE 06/16/2006    Past Surgical History:  Procedure Laterality Date  . BACK SURGERY    . CESAREAN SECTION    . FOOT SURGERY    . SHOULDER SURGERY      OB History    No data available       Home Medications    Prior to Admission medications   Medication Sig Start Date End Date Taking? Authorizing Provider  amitriptyline (ELAVIL) 25 MG tablet Take 1 tablet (25 mg total) by mouth at bedtime. 01/23/16  Yes [place] Bing, DO  atorvastatin (LIPITOR) 40 MG tablet Take 1 tablet (40 mg total) by mouth at bedtime. 01/23/16  Yes [place] Bing, DO  glimepiride (AMARYL) 4 MG tablet Take 1 tablet (4 mg total) by mouth daily with breakfast. 09/29/16  Yes Lacretia Leigh, MD  HYDROcodone-acetaminophen (NORCO/VICODIN) 5-325 MG tablet Take 1-2 tablets by mouth every 4 (four) hours as needed. Patient taking differently: Take 1-2 tablets by mouth every 4 (four) hours as needed for moderate pain.  09/29/16  Yes Lacretia Leigh, MD  methocarbamol (ROBAXIN) 500 MG tablet Take 1-2 tablets (500-1,000 mg total) by mouth every 6 (six) hours as needed for muscle spasms. 06/23/16  Yes West, Emily, PA-C  OVER THE COUNTER MEDICATION Take 1 tablet by  mouth daily. Vitamin B12 gummies   Yes [provider]  promethazine (PHENERGAN) 25 MG tablet Take 1 tablet (25 mg total) by mouth every 6 (six) hours as needed for nausea or vomiting. 09/29/16  Yes Lacretia Leigh, MD  lidocaine (LIDODERM) 5 % Place 1 patch onto the skin daily. Remove & Discard patch within 12 hours or as directed by MD Patient not taking: Reported on 10/07/2016 06/23/16   Clayton Bibles, PA-C  nicotine (NICODERM CQ) 7 mg/24hr patch Place 1 patch (7 mg total) onto the skin daily. Patient not taking: Reported on 01/23/2016 10/25/13   Ma Hillock, DO    Family History Family History  Problem Relation Age of Onset  . Kidney failure Mother   . Diabetes Mother   . Thyroid disease Mother   . Hypertension Mother   . Heart failure Mother   . Kidney failure Father   . Cancer Sister   . Stroke Brother   . Cancer Other     Social History Social History  Substance Use Topics  . Smoking status: Current Every Day Smoker    Packs/day: 0.50    Years: 20.00    Types: Cigarettes  . Smokeless tobacco: Never Used  . Alcohol use No     Allergies   Patient has no known allergies.   Review of Systems Review of Systems  Constitutional: Negative for chills and fever.  HENT:  Negative for ear pain and sore throat.   Eyes: Negative for pain and visual disturbance.  Respiratory: Negative for cough and shortness of breath.   Cardiovascular: Negative for chest pain and palpitations.  Gastrointestinal: Positive for abdominal pain, nausea and vomiting.  Genitourinary: Negative for dysuria and hematuria.  Musculoskeletal: Negative for arthralgias and back pain.  Skin: Negative for color change and rash.  Neurological: Negative for seizures and syncope.  All other systems reviewed and are negative.    Physical Exam Updated Vital Signs BP 135/81   Pulse 73   Temp 98.2 F (36.8 C) (Oral)   Resp 16   SpO2 97%   Physical Exam  Constitutional: She appears well-developed.  She appears ill. No distress.  HENT:  Head: Normocephalic and atraumatic.  Eyes: Conjunctivae are normal.  Neck: Neck supple.  Cardiovascular: Normal rate and regular rhythm.   No murmur heard. Pulmonary/Chest: Effort normal and breath sounds normal. No respiratory distress.  Abdominal: Soft. There is tenderness (Epigastric and LUQ TTP).  Musculoskeletal: She exhibits no edema.  Neurological: She is alert. No cranial nerve deficit. Coordination normal.  5/5 motor strength and intact sensation in all extremities. Finger-to-nose intact bilaterally  Skin: Skin is warm and dry.  Nursing note and vitals reviewed.    ED Treatments / Results  Labs (all labs ordered are listed, but only abnormal results are displayed) Labs Reviewed  LIPASE, BLOOD - Abnormal; Notable for the following:       Result Value   Lipase 129 (*)    All other components within normal limits  COMPREHENSIVE METABOLIC PANEL - Abnormal; Notable for the following:    Sodium 134 (*)    Chloride 98 (*)    Glucose, Bld 475 (*)    Albumin 3.0 (*)    ALT 9 (*)    All other components within normal limits  CBC - Abnormal; Notable for the following:    Hemoglobin 11.8 (*)    All other components within normal limits  URINALYSIS, ROUTINE W REFLEX MICROSCOPIC - Abnormal; Notable for the following:    APPearance CLOUDY (*)    Glucose, UA >=500 (*)    Hgb urine dipstick MODERATE (*)    Protein, ur 100 (*)    Bacteria, UA MANY (*)    Squamous Epithelial / LPF 6-30 (*)    All other components within normal limits  ETHANOL    EKG  EKG Interpretation None       Radiology Ct Abdomen Pelvis W Contrast  Result Date: 10/07/2016 CLINICAL DATA:  56 year old female with abdominal pain and elevated LFTs. EXAM: CT ABDOMEN AND PELVIS WITH CONTRAST TECHNIQUE: Multidetector CT imaging of the abdomen and pelvis was performed using the standard protocol following bolus administration of intravenous contrast. CONTRAST:  19mL  ISOVUE-300 IOPAMIDOL (ISOVUE-300) INJECTION 61% COMPARISON:  Abdominal CT dated 01/15/2008 FINDINGS: Lower chest: The visualized lung bases are clear. No intra-abdominal free air or free fluid. Hepatobiliary: No focal liver abnormality is seen. No gallstones, gallbladder wall thickening, or biliary dilatation. Pancreas: There is inflammatory changes of the pancreas primarily involving the dx head and uncinate process of the pancreas most consistent with acute pancreatitis. Correlation with pancreatic enzymes recommended. There is no drainable fluid collection or abscess or pseudocyst. No dilatation of the main pancreatic duct. Spleen: Normal in size without focal abnormality. Adrenals/Urinary Tract: The kidneys, adrenal glands, and visualized ureters appear unremarkable. The urinary bladder is moderately distended. Air within the urinary bladder noted which may be related to  recent instrumentation. Clinical correlation is recommended. Stomach/Bowel: There is moderate stool throughout the colon. No evidence of bowel obstruction or active inflammation. Normal appendix Vascular/Lymphatic: There is mild aortoiliac atherosclerotic disease. No aneurysmal dilatation or evidence of dissection. The origins of the celiac axis, SMA, IMA are patent. There is a circumaortic left renal vein anatomy. The SMV, splenic vein, and main portal vein are patent. No portal venous gas identified. No adenopathy. Reproductive: The uterus and ovaries are grossly unremarkable. Other: None Musculoskeletal: No acute or significant osseous findings. IMPRESSION: 1. Acute pancreatitis.  No abscess. 2. No bowel obstruction.  Normal appendix. 3.  Aortic Atherosclerosis (ICD10-I70.0). Electronically Signed   By: Anner Crete M.D.   On: 10/07/2016 23:07    Procedures Procedures (including critical care time)  Medications Ordered in ED Medications  sodium chloride 0.9 % bolus 1,000 mL (0 mLs Intravenous Stopped 10/07/16 2300)  ondansetron  (ZOFRAN) injection 4 mg (4 mg Intravenous Given 10/07/16 2129)  insulin aspart (novoLOG) injection 10 Units (10 Units Subcutaneous Given 10/07/16 2129)  iopamidol (ISOVUE-300) 61 % injection (100 mLs Intravenous Contrast Given 10/07/16 2225)  morphine 4 MG/ML injection 4 mg (4 mg Intravenous Given 10/07/16 2332)  sodium chloride 0.9 % bolus 1,000 mL (1,000 mLs Intravenous New Bag/Given 10/08/16 0010)     Initial Impression / Assessment and Plan / ED Course  I have reviewed the triage vital signs and the nursing notes.  Pertinent labs & imaging results that were available during my care of the patient were reviewed by me and considered in my medical decision making (see chart for details).     56 year old female history of diabetes, hyperlipidemia, and tobacco use who presents with epigastric pain radiating to back.  She was evaluated at [facility] long ED 8 days ago.  She was diagnosed clinically with pancreatitis with elevated lipase and sent home with oral norco, phenergan, and amaryl. She states her symptoms have continued despite compliance with medictions, currently for 11 days now.  Endorses continued severe epiagstric pain that radiates to her back.  Denies alcohol or other illict drug use.  Afebrile, VSS.  Lungs clear to auscultation bilaterally.  TTP of epigastric and LUQ abdomen.  Negative Murphy sign.  CBC unremarkable.  CMP showing glucose 475 and normal LFTs.  Lipase 129.  CT abdomen pelvis showing acute pancreatitis without abscess.  Suspect acute pancreatitis failing outpatient therapy.  Patient admitted to family medicine for further management and evaluation.  Patient stable time of transfer.  Pt care d/w Dr. Darl Householder  Final Clinical Impressions(s) / ED Diagnoses   Final diagnoses:  Acute pancreatitis, unspecified complication status, unspecified pancreatitis type    New Prescriptions New Prescriptions   No medications on file     Payton Emerald, MD 10/08/16 0107    Drenda Freeze, MD 10/08/16 1910

## 2016-10-08 DIAGNOSIS — K859 Acute pancreatitis without necrosis or infection, unspecified: Secondary | ICD-10-CM

## 2016-10-08 HISTORY — DX: Acute pancreatitis without necrosis or infection, unspecified: K85.90

## 2016-10-08 LAB — GLUCOSE, CAPILLARY
GLUCOSE-CAPILLARY: 142 mg/dL — AB (ref 65–99)
Glucose-Capillary: 128 mg/dL — ABNORMAL HIGH (ref 65–99)
Glucose-Capillary: 127 mg/dL — ABNORMAL HIGH (ref 65–99)
Glucose-Capillary: 66 mg/dL (ref 65–99)
Glucose-Capillary: 147 mg/dL — ABNORMAL HIGH (ref 65–99)

## 2016-10-08 LAB — CBC
HCT: 33.8 % — ABNORMAL LOW (ref 36.0–46.0)
Hemoglobin: 10.9 g/dL — ABNORMAL LOW (ref 12.0–15.0)
MCH: 27.5 pg (ref 26.0–34.0)
MCHC: 32.2 g/dL (ref 30.0–36.0)
MCV: 85.4 fL (ref 78.0–100.0)
PLATELETS: 222 10*3/uL (ref 150–400)
RBC: 3.96 MIL/uL (ref 3.87–5.11)
RDW: 13 % (ref 11.5–15.5)
WBC: 12.5 10*3/uL — AB (ref 4.0–10.5)

## 2016-10-08 LAB — LIPID PANEL
CHOLESTEROL: 231 mg/dL — AB (ref 0–200)
HDL: 32 mg/dL — ABNORMAL LOW (ref >40)
LDL Cholesterol: 140 mg/dL — ABNORMAL HIGH (ref 0–99)
TRIGLYCERIDES: 294 mg/dL — AB (ref <150)
Total CHOL/HDL Ratio: 7.2 RATIO
VLDL: 59 mg/dL — ABNORMAL HIGH (ref 0–40)

## 2016-10-08 LAB — COMPREHENSIVE METABOLIC PANEL
ALK PHOS: 69 U/L (ref 38–126)
ALT: 8 U/L — AB (ref 14–54)
AST: 17 U/L (ref 15–41)
Albumin: 2.8 g/dL — ABNORMAL LOW (ref 3.5–5.0)
Anion gap: 6 (ref 5–15)
BUN: 12 mg/dL (ref 6–20)
CALCIUM: 8.9 mg/dL (ref 8.9–10.3)
CHLORIDE: 108 mmol/L (ref 101–111)
CO2: 28 mmol/L (ref 22–32)
CREATININE: 0.85 mg/dL (ref 0.44–1.00)
Glucose, Bld: 68 mg/dL (ref 65–99)
Potassium: 3.2 mmol/L — ABNORMAL LOW (ref 3.5–5.1)
Sodium: 142 mmol/L (ref 135–145)
Total Bilirubin: 0.5 mg/dL (ref 0.3–1.2)
Total Protein: 6.1 g/dL — ABNORMAL LOW (ref 6.5–8.1)

## 2016-10-08 LAB — HIV ANTIBODY (ROUTINE TESTING W REFLEX): HIV Screen 4th Generation wRfx: NONREACTIVE

## 2016-10-08 MED ORDER — NICOTINE 14 MG/24HR TD PT24
14.0000 mg | MEDICATED_PATCH | Freq: Every day | TRANSDERMAL | Status: DC
  Administered 2016-10-08 – 2016-10-10 (×3): 14 mg via TRANSDERMAL
  Filled 2016-10-08 (×3): qty 1

## 2016-10-08 MED ORDER — POLYETHYLENE GLYCOL 3350 17 G PO PACK
17.0000 g | PACK | Freq: Every day | ORAL | Status: DC
  Administered 2016-10-09 – 2016-10-10 (×2): 17 g via ORAL
  Filled 2016-10-08 (×3): qty 1

## 2016-10-08 MED ORDER — HYDROCODONE-ACETAMINOPHEN 5-325 MG PO TABS
1.0000 | ORAL_TABLET | ORAL | Status: DC | PRN
  Administered 2016-10-09 – 2016-10-10 (×2): 2 via ORAL
  Filled 2016-10-08 (×2): qty 2

## 2016-10-08 MED ORDER — PROMETHAZINE HCL 25 MG PO TABS: 25.0000 mg | ORAL_TABLET | Freq: Four times a day (QID) | ORAL | Status: DC | PRN

## 2016-10-08 MED ORDER — POTASSIUM CHLORIDE CRYS ER 20 MEQ PO TBCR: 40.0000 meq | EXTENDED_RELEASE_TABLET | Freq: Two times a day (BID) | ORAL | Status: DC

## 2016-10-08 MED ORDER — SODIUM CHLORIDE 0.9 % IV BOLUS (SEPSIS)
1000.0000 mL | Freq: Once | INTRAVENOUS | Status: AC
  Administered 2016-10-08: 1000 mL via INTRAVENOUS

## 2016-10-08 MED ORDER — ENOXAPARIN SODIUM 40 MG/0.4ML ~~LOC~~ SOLN
40.0000 mg | Freq: Every day | SUBCUTANEOUS | Status: DC
  Administered 2016-10-08 – 2016-10-10 (×3): 40 mg via SUBCUTANEOUS
  Filled 2016-10-08 (×3): qty 0.4

## 2016-10-08 MED ORDER — ATORVASTATIN CALCIUM 40 MG PO TABS
40.0000 mg | ORAL_TABLET | Freq: Every day | ORAL | Status: DC
  Administered 2016-10-08 – 2016-10-09 (×2): 40 mg via ORAL
  Filled 2016-10-08 (×2): qty 1

## 2016-10-08 MED ORDER — AMITRIPTYLINE HCL 50 MG PO TABS
25.0000 mg | ORAL_TABLET | Freq: Every day | ORAL | Status: DC
  Administered 2016-10-08 – 2016-10-09 (×2): 25 mg via ORAL
  Filled 2016-10-08 (×2): qty 1

## 2016-10-08 MED ORDER — LIVING WELL WITH DIABETES BOOK
Freq: Once | Status: AC
  Administered 2016-10-08: 19:00:00
  Filled 2016-10-08: qty 1

## 2016-10-08 MED ORDER — INSULIN ASPART 100 UNIT/ML ~~LOC~~ SOLN: 0.0000 [IU] | Freq: Three times a day (TID) | SUBCUTANEOUS | Status: DC

## 2016-10-08 MED ORDER — POTASSIUM CHLORIDE IN NACL 40-0.9 MEQ/L-% IV SOLN
INTRAVENOUS | Status: AC
  Administered 2016-10-08: 75 mL/h via INTRAVENOUS
  Filled 2016-10-08: qty 1000

## 2016-10-08 MED ORDER — INSULIN ASPART 100 UNIT/ML ~~LOC~~ SOLN
0.0000 [IU] | Freq: Three times a day (TID) | SUBCUTANEOUS | Status: DC
  Administered 2016-10-08 (×2): 1 [IU] via SUBCUTANEOUS
  Administered 2016-10-09 (×2): 2 [IU] via SUBCUTANEOUS
  Administered 2016-10-09: 3 [IU] via SUBCUTANEOUS
  Administered 2016-10-10: 5 [IU] via SUBCUTANEOUS

## 2016-10-08 MED ORDER — DEXTROSE 50 % IV SOLN
INTRAVENOUS | Status: AC
  Administered 2016-10-08: 25 mL
  Filled 2016-10-08: qty 50

## 2016-10-08 MED ORDER — MORPHINE SULFATE (PF) 4 MG/ML IV SOLN
2.0000 mg | INTRAVENOUS | Status: DC | PRN
  Administered 2016-10-08: 2 mg via INTRAVENOUS
  Filled 2016-10-08: qty 1

## 2016-10-08 NOTE — Evaluation (Signed)
Physical Therapy Evaluation Patient Details Name: Andrea Glenn MRN: [id number] DOB: 1961/03/08 Today's Date: 10/08/2016   History of Present Illness  Andrea Glenn is a 56 y.o. female presenting with continued abdominal pain after trial of outpatient treatment of pancreatitis. PMH is significant for T2DM, tobacco abuse, HLD, and diabetic neuropathy.   Clinical Impression  Pt admitted with/for abdominal pain from pancreatitis and weakness from weeks of lack or mobility.  Pt needing min assist and AD to mobilize at this time, but needs to be able to mobilize independently at home..  Pt currently limited functionally due to the problems listed below.  (see problems list.)  Pt will benefit from PT to maximize function and safety to be able to get home safely with available assist.     Follow Up Recommendations Home health PT;Supervision for mobility/OOB    Equipment Recommendations  Rolling walker with 5" wheels    Recommendations for Other Services       Precautions / Restrictions Precautions Precautions: Fall      Mobility  Bed Mobility Overal bed mobility: Needs Assistance Bed Mobility: Sit to Supine     Supine to sit: Min guard;HOB elevated Sit to supine: Min assist   General bed mobility comments: difficulty pulling legs into bed.  Needed assist repositioning in the bed.  Transfers Overall transfer level: Needs assistance Equipment used: Rolling walker (2 wheeled);None Transfers: Sit to/from Stand Sit to Stand: Min guard         General transfer comment: cues for hand placement. guard or stability assist  Ambulation/Gait Ambulation/Gait assistance: Min assist Ambulation Distance (Feet): 40 Feet Assistive device: Rolling walker (2 wheeled) Gait Pattern/deviations: Step-through pattern;Step-to pattern;Decreased stride length   Gait velocity interpretation: Below normal speed for age/gender General Gait Details: pt's gait marked by pt staying bent of the RW,  making eradic, antalgic steps due to painful feet.  Not at all age appropriate gait  Stairs            Wheelchair Mobility    Modified Rankin (Stroke Patients Only)       Balance Overall balance assessment: Needs assistance Sitting-balance support: No upper extremity supported Sitting balance-Leahy Scale: Good     Standing balance support: Bilateral upper extremity supported Standing balance-Leahy Scale: Poor Standing balance comment: needing external support for safety                             Pertinent Vitals/Pain Pain Assessment: Faces Faces Pain Scale: Hurts even more Pain Location: feet, back, other vague areas. Pain Descriptors / Indicators: Aching;Burning;Sore;Grimacing Pain Intervention(s): Monitored during session;Limited activity within patient's tolerance    Home Living Family/patient expects to be discharged to:: Private residence Living Arrangements: Other relatives (son and nephew) Available Help at Discharge: Family;Available PRN/intermittently Type of Home: House       Home Layout: One level Home Equipment: None      Prior Function Level of Independence: Independent         Comments: pt reports before this episode of abd/back pain, she was generally independent with limitation of LE/foot neuropathy.  She has been relatively immobile since the (pancreatitis)     Hand Dominance        Extremity/Trunk Assessment   Upper Extremity Assessment Upper Extremity Assessment: Overall WFL for tasks assessed (though general weakness)    Lower Extremity Assessment Lower Extremity Assessment: Generalized weakness       Communication   Communication: No  difficulties  Cognition Arousal/Alertness: Awake/alert Behavior During Therapy: WFL for tasks assessed/performed Overall Cognitive Status: Within Functional Limits for tasks assessed                                        General Comments General comments  (skin integrity, edema, etc.): pt needs some therapy or structured activity to help her get mobilizing.  She also needs to get the help of a physican to address the neuropathic pain.    Exercises     Assessment/Plan    PT Assessment Patient needs continued PT services  PT Problem List Decreased strength;Decreased activity tolerance;Decreased balance;Decreased mobility;Decreased knowledge of use of DME;Pain       PT Treatment Interventions Gait training;Functional mobility training;Therapeutic activities;Balance training;Patient/family education    PT Goals (Current goals can be found in the Care Plan section)  Acute Rehab PT Goals Patient Stated Goal: get better and back to my more active self PT Goal Formulation: With patient Time For Goal Achievement: 10/15/16 Potential to Achieve Goals: Fair    Frequency Min 3X/week   Barriers to discharge        Co-evaluation               AM-PAC PT "6 Clicks" Daily Activity  Outcome Measure Difficulty turning over in bed (including adjusting bedclothes, sheets and blankets)?: A Little Difficulty moving from lying on back to sitting on the side of the bed? : A Little Difficulty sitting down on and standing up from a chair with arms (e.g., wheelchair, bedside commode, etc,.)?: A Little Help needed moving to and from a bed to chair (including a wheelchair)?: A Little Help needed walking in hospital room?: A Little Help needed climbing 3-5 steps with a railing? : A Lot 6 Click Score: 17    End of Session   Activity Tolerance: Patient limited by pain;Patient limited by fatigue Patient left: in bed;with call bell/phone within reach;with bed alarm set Nurse Communication: Mobility status PT Visit Diagnosis: Unsteadiness on feet (R26.81);Other abnormalities of gait and mobility (R26.89);Muscle weakness (generalized) (M62.81);Pain Pain - Right/Left:  (both) Pain - part of body:  (feet ( neuropathic))    Time: 2376-2831 PT Time  Calculation (min) (ACUTE ONLY): 33 min   Charges:   PT Evaluation $PT Eval Moderate Complexity: 1 Procedure PT Treatments $Gait Training: 8-22 mins   PT G Codes:        14-Oct-2016  Donnella Sham, PT [phone]  (pager)  Tessie Fass Venkat Ankney 2016-10-14, 1:38 PM

## 2016-10-08 NOTE — Progress Notes (Signed)
    Inpatient Diabetes Program Recommendations  AACE/ADA: New Consensus Statement on Inpatient Glycemic Control (2015)  Target Ranges:  Prepandial:   less than 140 mg/dL      Peak postprandial:   less than 180 mg/dL (1-2 hours)      Critically ill patients:  140 - 180 mg/dL   Lab Results  Component Value Date   GLUCAP 127 (H) 10/08/2016   HGBA1C >16.7 (H) 06/13/2015    Review of Glycemic ControlResults for Andrea Glenn, Andrea Glenn (MRN [id number]) as of 10/08/2016 15:27  Ref. Range 10/25/2013 16:36 06/13/2015 00:13  Hemoglobin A1C Latest Ref Range: 4.8 - 5.6 % >14.0 >16.7 (H)    Diabetes history: Type 2 diabetes Outpatient Diabetes medications: Amaryl 4 mg daily to breakfast Current orders for Inpatient glycemic control:  Novolog sensitive tid with meals and HS  Inpatient Diabetes Program Recommendations:    Referral received. A1C pending however last A1C>16%. ? Need for basal insulin at d/c.  Called and discussed with MD.  Patient does not have prescription coverage at this time. ? If she qualifies for orange card.  Discussed with patient the complications of high blood sugars and the potential need for insulin.  She states she is willing to take insulin b/c she wants to live.  Ordered Living well with diabetes booklet with patient.  Discussed importance of monitoring blood sugars and instructed patient to obtain blood sugar meter from [employer]- [employer] brand.  Told her to check blood sugars daily at different time and to keep log for MD.  Interestingly, blood sugars came down fairly well with Novolog 10 units.  If basal insulin is started, consider low dose such as Lantus 5 units.  If patient to d/c on insulin will likely need assistance due to affordability/cost. Patient does admit to drinking water along with gingerale, sweet tea, and lemonade.  Discussed importance of eliminating sugar from drinks.  Will need close follow-up with PCP.   Thanks, Adah Perl, RN, BC-ADM Inpatient Diabetes  Coordinator Pager [phone] (8a-5p)

## 2016-10-08 NOTE — Progress Notes (Signed)
Met with patient to discuss admission for acute pancreatitis. Patient last seen by me on 01/2016 for uncontrolled diabetes formally on metformin but discontinued due to intolerance, now on Amaryl which patient has not been taking due to lack of insurance and the ability to afford medications. Patient has not followed up since last visit due to family matters including caring for her sister in hospice who passed on May 23, 2016, and now her father with advanced age. She says she "cheats" her diabetes often by drinking Dr. Malachi Bonds and eating 3 [employer] bars. Patient states she was told by the ED she should switch practices to [facility]. Patient is not sure if she wants to switch practices and is concerned because of her lack of insurance. I discussed at length obtaining an orange card and applying for MAPs. Patient in understanding and optimistic about discharge tomorrow. Will need to have close follow-up for poorly controlled diabetes which I believe contributed to this current episode of pancreatitis. I have a low suspicion that medications are the source given her inability to obtain medications due to financial hardship. Appreciate the great care [facility] Teaching Service is providing. Will monitor closely. -- Harriet Butte, [place], PGY-1

## 2016-10-08 NOTE — Progress Notes (Signed)
Family Medicine Teaching Service Daily Progress Note Intern Pager: [phone]  Patient name: Andrea Glenn Medical record number: [id number] Date of birth: [birth year] Age: 56 y.o. Gender: female  Primary Care Provider: Andrews Bing, DO Consultants: None  Code Status: Full   Assessment and Plan: ADDILEE NEU is a 56 y.o. female with a past medical history significant for  T2DM, tobacco abuse, HLD, and diabetic neuropathy who presented with continued abdominal pain recent diagnosis of pancreatitis.  #Recent diagnosis of pancreatitis, stable Patient continue to endorses abdominal and back pain consistent with pancreatitis confirmed on CT with elevated lipase to 129.  She was hypotensive on admission but now normotensive. Etiology is unclear differential would include drug-induced pancreatitis a possibility with patient on atorvastatin and glimepiride, cigarette smoking also puts at higher risk. Patient also has uncontrolled diabetes which could also cause pancreatitis. No gallstones noted on CT abdomen pelvis.  --NPO, ADAT --Continue to hold statin --Hydrocodone-acetaminophen 1-2 tablets q4h prn --Morphine 2mg  IV q4h prn for severe pain, deescalate as pain improve --Phenergan 25 mg q6h prn for nausea --I/Os   #Hemoglobinuria UA with moderate hemoglobin (6-30 RBC/hpf) and many bacteria, though also with 6-30 squams and negative nitrites and leukocytes. No CVA tenderness.  --Follow up on urine culture  #T2DM, uncontrolled CBG 475 in ED, AG 11. Diagnosed in 2012. Last hgb A1c > 16.7 on 05/2015. Takes glimepiride 4 mg. Slightly hypoglycemic overnight after receiving insulin.This morning BG 128 --s/p 10 units of novolog in ED.  --CBGs qACHS --Sensitive SSI.  --Follow up on Hemoglobin A1c --Diabetic education order  #Tobacco abuse Smokes 1 pack of cigarettes about every 2 days. 20 pack year history.  --Nicotine patch 14 mg daily --Counsel on smoking cessation  #Balance  concerns Daughter concerned that patient can't stand for long periods of time.  --Will follow up on PT eval  #Social Does not have insurance. --Encourage patient to apply for [facility]  FEN/GI: NPO, miralax Prophylaxis: lovenox   Disposition: Home pending clinical improvement, possibility for SNF will follow up on PT evaluation  Subjective:  Patient is feeling slightly better this morning. Still complains of epigastric, LUQ pain radiating to her back. Patient is asking for some food as she reports being hungry.  Objective: Temp:  [97.5 F (36.4 C)-98.2 F (36.8 C)] 97.5 F (36.4 C) (06/22 0535) Pulse Rate:  [73-83] 80 (06/22 0535) Resp:  [16-18] 18 (06/22 0535) BP: (93-137)/(55-95) 126/79 (06/22 0535) SpO2:  [97 %-100 %] 100 % (06/22 0535) Weight:  [121 lb 6.4 oz (55.1 kg)] 121 lb 6.4 oz (55.1 kg) (06/22 0244)  Physical Exam:  General: NAD, pleasant, able to participate in exam Cardiac: RRR, normal heart sounds, no murmurs. 2+ radial and PT pulses bilaterally Respiratory: CTAB, normal effort, No wheezes, rales or rhonchi Abdomen: soft, epigastric and LUQ pain on palpation, nondistended, no hepatic or splenomegaly, +BS Extremities: no edema or cyanosis. WWP. Skin: warm and dry, no rashes noted Neuro: alert and oriented x4, no focal deficits Psych: Normal affect and mood   Laboratory:  Recent Labs Lab 10/07/16 1957 10/08/16 0318  WBC 10.0 12.5*  HGB 11.8* 10.9*  HCT 36.4 33.8*  PLT 219 222    Recent Labs Lab 10/07/16 1957 10/08/16 0318  NA 134* 142  K 4.0 3.2*  CL 98* 108  CO2 25 28  BUN 17 12  CREATININE 0.90 0.85  CALCIUM 9.4 8.9  PROT 6.9 6.1*  BILITOT 0.3 0.5  ALKPHOS 82 69  ALT 9* 8*  AST 16 17  GLUCOSE 475* 68     Imaging/Diagnostic Tests: Ct Abdomen Pelvis W Contrast  Result Date: 10/07/2016 CLINICAL DATA:  56 year old female with abdominal pain and elevated LFTs. EXAM: CT ABDOMEN AND PELVIS WITH CONTRAST TECHNIQUE: Multidetector CT  imaging of the abdomen and pelvis was performed using the standard protocol following bolus administration of intravenous contrast. CONTRAST:  158mL ISOVUE-300 IOPAMIDOL (ISOVUE-300) INJECTION 61% COMPARISON:  Abdominal CT dated 01/15/2008 FINDINGS: Lower chest: The visualized lung bases are clear. No intra-abdominal free air or free fluid. Hepatobiliary: No focal liver abnormality is seen. No gallstones, gallbladder wall thickening, or biliary dilatation. Pancreas: There is inflammatory changes of the pancreas primarily involving the dx head and uncinate process of the pancreas most consistent with acute pancreatitis. Correlation with pancreatic enzymes recommended. There is no drainable fluid collection or abscess or pseudocyst. No dilatation of the main pancreatic duct. Spleen: Normal in size without focal abnormality. Adrenals/Urinary Tract: The kidneys, adrenal glands, and visualized ureters appear unremarkable. The urinary bladder is moderately distended. Air within the urinary bladder noted which may be related to recent instrumentation. Clinical correlation is recommended. Stomach/Bowel: There is moderate stool throughout the colon. No evidence of bowel obstruction or active inflammation. Normal appendix Vascular/Lymphatic: There is mild aortoiliac atherosclerotic disease. No aneurysmal dilatation or evidence of dissection. The origins of the celiac axis, SMA, IMA are patent. There is a circumaortic left renal vein anatomy. The SMV, splenic vein, and main portal vein are patent. No portal venous gas identified. No adenopathy. Reproductive: The uterus and ovaries are grossly unremarkable. Other: None Musculoskeletal: No acute or significant osseous findings. IMPRESSION: 1. Acute pancreatitis.  No abscess. 2. No bowel obstruction.  Normal appendix. 3.  Aortic Atherosclerosis (ICD10-I70.0). Electronically Signed   By: Anner Crete M.D.   On: 10/07/2016 23:07    Marjie Skiff, MD 10/08/2016, 9:41  AM PGY-1, [place] Intern pager: [phone], text pages welcome
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
## 2016-10-08 NOTE — Progress Notes (Signed)
Patient received in unit via bed. Patient is alert oriented x4. Vital signs are stable. Skin assessment done with another nurse found intact. Pain management done. Patient given instruction about call ligght, phone and unit routine.

## 2016-10-08 NOTE — Progress Notes (Signed)
Transitions of Care Pharmacy Note  Plan:  Educated on insulin, metformin, statins, tobacco cessation Addressed concerns regarding metformin therapy and potential insulin regimens Recommend initiating insulin when no longer NPO, starting metformin XR 500mg  daily when pancreatitis resolves, initiating high intensity statin Inpatient follow-up with family medicine Outpatient follow-up with [facility] and Dr. Valentina Lucks --------------------------------------------- Andrea Glenn is an 56 y.o. female who presents with a chief complaint abdominal pain/back pain. In anticipation of discharge, pharmacy has reviewed this patient's prior to admission medication history, as well as current inpatient medications listed per the [facility].  Current medication indications, dosing, frequency, and notable side effects reviewed with patient. patient verbalized understanding of current inpatient medication regimen and is aware that the After Visit Summary when presented, will represent the most accurate medication list at discharge.   Andrea Glenn expressed concerns regarding initiating metformin therapy at low doses, insurance coverage, and insulin regimens.   Assessment: Understanding of regimen: excellent Understanding of indications: excellent Potential of compliance: good Barriers to Obtaining Medications: Yes  Patient instructed to contact inpatient pharmacy team with further questions or concerns if needed.    Time spent preparing for discharge counseling: 15 minutes Time spent counseling patient: 45 minutes   Thank you for allowing pharmacy to be a part of this patient's care.  Dierdre Harness, [place], PharmD Clinical Pharmacy Resident [phone] (Pager) 10/08/2016 5:46 PM

## 2016-10-08 NOTE — Progress Notes (Signed)
Hypoglycemic Event  CBG: 66  Treatment: 25 ML D50  Symptoms: Tiredness  Follow-up CBG: [id number] CBG Result:142  Possible Reasons for Event: Inadequate meal intake (NPO)    Andrea Glenn

## 2016-10-09 LAB — GLUCOSE, CAPILLARY
GLUCOSE-CAPILLARY: 163 mg/dL — AB (ref 65–99)
GLUCOSE-CAPILLARY: 346 mg/dL — AB (ref 65–99)
Glucose-Capillary: 118 mg/dL — ABNORMAL HIGH (ref 65–99)
Glucose-Capillary: 207 mg/dL — ABNORMAL HIGH (ref 65–99)

## 2016-10-09 LAB — COMPREHENSIVE METABOLIC PANEL
ALBUMIN: 2.7 g/dL — AB (ref 3.5–5.0)
ALT: 8 U/L — ABNORMAL LOW (ref 14–54)
ANION GAP: 8 (ref 5–15)
AST: 17 U/L (ref 15–41)
Alkaline Phosphatase: 70 U/L (ref 38–126)
BUN: 7 mg/dL (ref 6–20)
CO2: 24 mmol/L (ref 22–32)
Calcium: 8.9 mg/dL (ref 8.9–10.3)
Chloride: 107 mmol/L (ref 101–111)
Creatinine, Ser: 0.67 mg/dL (ref 0.44–1.00)
GFR calc non Af Amer: >60 mL/min (ref >60)
GLUCOSE: 149 mg/dL — AB (ref 65–99)
Potassium: 4 mmol/L (ref 3.5–5.1)
SODIUM: 139 mmol/L (ref 135–145)
TOTAL PROTEIN: 6.2 g/dL — AB (ref 6.5–8.1)
Total Bilirubin: 0.4 mg/dL (ref 0.3–1.2)

## 2016-10-09 MED ORDER — GLIMEPIRIDE 4 MG PO TABS
4.0000 mg | ORAL_TABLET | Freq: Every day | ORAL | Status: DC
  Administered 2016-10-09 – 2016-10-10 (×2): 4 mg via ORAL
  Filled 2016-10-09 (×2): qty 1

## 2016-10-09 MED ORDER — SODIUM CHLORIDE 0.9 % IV SOLN
INTRAVENOUS | Status: DC
  Administered 2016-10-09: 04:00:00 via INTRAVENOUS

## 2016-10-09 NOTE — Progress Notes (Signed)
Patient states that hydrocodone is not effective for pain control. Rn offered to call covering physician for alternate medication. Family medicine paged/ on call physician states no IV medications will be ordered at this time

## 2016-10-09 NOTE — Discharge Summary (Signed)
[facility] Discharge Summary  Patient name: Andrea Glenn Medical record number: [id number] Date of birth: 1960-11-29 Age: 56 y.o. Gender: female Date of Admission: 10/07/2016  Date of Discharge:10/10/2016 Admitting Physician: Lind Covert, MD  Primary Care Provider: Kenbridge Bing, DO Consultants: None  Indication for Hospitalization: Pancreatitis  Discharge Diagnoses/Problem List:  Pancreatitis  Uncontrolled T2DM Tobacco abuse Hyperlipidemia Diabetic neuropathy  Disposition: Home  Discharge Condition: Stable  Discharge Exam:   General: NAD, pleasant, able to participate in exam Cardiac: RRR, normal heart sounds, no murmurs. 2+ PT pulses bilaterally Respiratory: CTAB, normal effort, No wheezes, rales or rhonchi Abdomen: soft, mildly TTP in LUQ without guarding, nondistended, no hepatic or splenomegaly, +BS Extremities: no edema or cyanosis. WWP. Skin: warm and dry, no rashes noted Neuro: alert and oriented x4, no focal deficits Psych: Normal affect and mood  Brief Hospital Course:  Patient is a 56 yo female who presented with abdominal pain radiating to her back after failing outpatient treatment for recent pancreatitis diagnosis. pateint had elevated lipase at 129 with CT abdomen pelvis showed acute pancreatitis. Initially hypotensive in the ED but responded well with IVF. Patient was made NPO and pain was controlled. Diet was advanced as tolerated and patient was restarted on her glimepiride before discharge with close follow up with PCP for medications optimizations. Patient A1c is greater than 15.5. Unclear etiology for her pancreatitis, likely secondary to uncontrolled diabetes. Patient also presented with hemoglobinuria and urine cultures grew >100 K colonies e. Coli but has been asymptomatic.  Issues for Follow Up:  1. Patient has uncontrolled T2DM currently only on glimepiride. Patient would benefit from a different medication  regimen i.e. Insulin. Patient has a follow up with Dr.Koval and PCP next week. 2. Follow up on possible UTI, treat if symptomatic. 3. Smoking cessation should also be addressed in the setting of recent pancreatitis. 3. Balance problems, seen by PT while inpatient and they recommended Home Health   Significant Procedures: none  Significant Labs and Imaging:   Recent Labs Lab 10/07/16 1957 10/08/16 0318  WBC 10.0 12.5*  HGB 11.8* 10.9*  HCT 36.4 33.8*  PLT 219 222    Recent Labs Lab 10/07/16 1957 10/08/16 0318 10/09/16 0347  NA 134* 142 139  K 4.0 3.2* 4.0  CL 98* 108 107  CO2 25 28 24   GLUCOSE 475* 68 149*  BUN 17 12 7   CREATININE 0.90 0.85 0.67  CALCIUM 9.4 8.9 8.9  ALKPHOS 82 69 70  AST 16 17 17   ALT 9* 8* 8*  ALBUMIN 3.0* 2.8* 2.7*   Urinalysis    Component Value Date/Time   COLORURINE YELLOW 10/07/2016 1950   APPEARANCEUR CLOUDY (A) 10/07/2016 1950   LABSPEC 1.027 10/07/2016 1950   PHURINE 5.0 10/07/2016 1950   GLUCOSEU >=500 (A) 10/07/2016 1950   HGBUR MODERATE (A) 10/07/2016 1950   BILIRUBINUR NEGATIVE 10/07/2016 1950   KETONESUR NEGATIVE 10/07/2016 1950   PROTEINUR 100 (A) 10/07/2016 1950   UROBILINOGEN 0.2 07/28/2013 0008   NITRITE NEGATIVE 10/07/2016 1950   LEUKOCYTESUR NEGATIVE 10/07/2016 1950   Ct Abdomen Pelvis W Contrast  Result Date: 10/07/2016 CLINICAL DATA:  56 year old female with abdominal pain and elevated LFTs. EXAM: CT ABDOMEN AND PELVIS WITH CONTRAST TECHNIQUE: Multidetector CT imaging of the abdomen and pelvis was performed using the standard protocol following bolus administration of intravenous contrast. CONTRAST:  122mL ISOVUE-300 IOPAMIDOL (ISOVUE-300) INJECTION 61% COMPARISON:  Abdominal CT dated 01/15/2008 FINDINGS: Lower chest: The visualized lung bases  are clear. No intra-abdominal free air or free fluid. Hepatobiliary: No focal liver abnormality is seen. No gallstones, gallbladder wall thickening, or biliary dilatation. Pancreas:  There is inflammatory changes of the pancreas primarily involving the dx head and uncinate process of the pancreas most consistent with acute pancreatitis. Correlation with pancreatic enzymes recommended. There is no drainable fluid collection or abscess or pseudocyst. No dilatation of the main pancreatic duct. Spleen: Normal in size without focal abnormality. Adrenals/Urinary Tract: The kidneys, adrenal glands, and visualized ureters appear unremarkable. The urinary bladder is moderately distended. Air within the urinary bladder noted which may be related to recent instrumentation. Clinical correlation is recommended. Stomach/Bowel: There is moderate stool throughout the colon. No evidence of bowel obstruction or active inflammation. Normal appendix Vascular/Lymphatic: There is mild aortoiliac atherosclerotic disease. No aneurysmal dilatation or evidence of dissection. The origins of the celiac axis, SMA, IMA are patent. There is a circumaortic left renal vein anatomy. The SMV, splenic vein, and main portal vein are patent. No portal venous gas identified. No adenopathy. Reproductive: The uterus and ovaries are grossly unremarkable. Other: None Musculoskeletal: No acute or significant osseous findings. IMPRESSION: 1. Acute pancreatitis.  No abscess. 2. No bowel obstruction.  Normal appendix. 3.  Aortic Atherosclerosis (ICD10-I70.0). Electronically Signed   By: Anner Crete M.D.   On: 10/07/2016 23:07    Results/Tests Pending at Time of Discharge: None  Discharge Medications:  Allergies as of 10/10/2016   No Known Allergies     Medication List    TAKE these medications   amitriptyline 25 MG tablet Commonly known as:  ELAVIL Take 1 tablet (25 mg total) by mouth at bedtime.   atorvastatin 40 MG tablet Commonly known as:  LIPITOR Take 1 tablet (40 mg total) by mouth at bedtime.   glimepiride 4 MG tablet Commonly known as:  AMARYL Take 1 tablet (4 mg total) by mouth daily with breakfast.    HYDROcodone-acetaminophen 5-325 MG tablet Commonly known as:  NORCO/VICODIN Take 1-2 tablets by mouth every 4 (four) hours as needed for moderate pain.   lidocaine 5 % Commonly known as:  LIDODERM Place 1 patch onto the skin daily. Remove & Discard patch within 12 hours or as directed by MD   methocarbamol 500 MG tablet Commonly known as:  ROBAXIN Take 1-2 tablets (500-1,000 mg total) by mouth every 6 (six) hours as needed for muscle spasms.   nicotine 7 mg/24hr patch Commonly known as:  NICODERM CQ Place 1 patch (7 mg total) onto the skin daily.   OVER THE COUNTER MEDICATION Take 1 tablet by mouth daily. Vitamin B12 gummies   promethazine 25 MG tablet Commonly known as:  PHENERGAN Take 1 tablet (25 mg total) by mouth every 6 (six) hours as needed for nausea or vomiting.       Discharge Instructions: Please refer to Patient Instructions section of EMR for full details.  Patient was counseled important signs and symptoms that should prompt return to medical care, changes in medications, dietary instructions, activity restrictions, and follow up appointments.   Follow-Up Appointments: Follow-up Information    Brackenridge Bing, DO. Go on 10/15/2016.   Why:  Hospital follow up appointment, please arrive at 1:45pm for check in. Contact information: [place] [address] [phone]        [place]. Go on 10/11/2016.   Why:  Pharmacy clinic for diabetes medications, please arrive at 11am for check in. Contact information: [address]  [place] [phone]          Marjie Skiff, MD 10/10/2016, 10:25 PM PGY-1, [place]

## 2016-10-09 NOTE — Progress Notes (Signed)
Family Medicine Teaching Service Daily Progress Note Intern Pager: [phone]  Patient name: Andrea Glenn Medical record number: [id number] Date of birth: 1960-07-15 Age: 56 y.o. Gender: female  Primary Care Provider: Sandborn Bing, DO Consultants: None  Code Status: Full   Assessment and Plan: Andrea Glenn is a 56 y.o. female with a past medical history significant for  T2DM, tobacco abuse, HLD, and diabetic neuropathy who presented with continued abdominal pain recent diagnosis of pancreatitis.  Pancreatitis, improved.  Unclear etiology. DDx includes drug-induced pancreatitis but more likely uncontrolled diabetes. No gallstones noted on CT abdomen pelvis.  - Soft diet, if tolerates then ADAT - Holding home atorvastatin - Continue home norco 5-325mg  1-2 tablets q4h prn - Phenergan 25 mg q6h prn for nausea - monitor I/Os   Hemoglobinuria. UA with moderate hemoglobin (6-30 RBC/hpf) and many bacteria, though also with 6-30 squams and negative nitrites and leukocytes. No CVA tenderness.  - Follow up on urine culture  T2DM, uncontrolled. Last A1c >16.7 on 06/13/15. Noncompliant on home medications d/t intolerance on metformin and unable to afford amaryl. Also has high sugar intake at home. CBG on admission 475, responded well to 10U novolog in ED. Am CBG 149 but hypoglycemic to 66 overnight. - If tolerating po, will restart home amaryl and monitor for hypoglycemia overnight - CBGs qACHS - Sensitive SSI.  - Repeat Hemoglobin A1c pending - Per Diabetic Coordinator, patient agreeable to starting insulin and was instructed to obtain blood sugar meter from [employer]- Reli[employer] brand to keep log for PCP.  - Per TOC Pharmacist, addressed retrialing metformin as outpatient.   Tobacco abuse 20 pack year history.  - Nicotine patch 14 mg daily - Counseled on smoking cessation  Balance concerns Daughter concerned that patient can't stand for long periods of time.  - PT rec: HHPT with  RW  Social: Does not have insurance. - Patient to apply for [facility] and MAP program  FEN/GI: soft diet and ADAT, miralax Prophylaxis: lovenox   Disposition: Home with HHPT pending tolerating po, likely tomorrow 6/24  Subjective:  Patient is feeling better this morning. States feels very hungry and her abdominal pain is now minimal, still in LUQ. States her back pain is actually chronic and that her home norco does not help for this. No nausea. Looking forward to eating today  Objective: Temp:  [98.3 F (36.8 C)-98.6 F (37 C)] 98.4 F (36.9 C) (06/23 0520) Pulse Rate:  [80-83] 80 (06/23 0520) Resp:  [16] 16 (06/23 0520) BP: (110-183)/(70-86) 110/70 (06/23 0700) SpO2:  [100 %] 100 % (06/23 0520)  Physical Exam:  General: NAD, pleasant, able to participate in exam Cardiac: RRR, normal heart sounds, no murmurs. 2+ PT pulses bilaterally Respiratory: CTAB, normal effort, No wheezes, rales or rhonchi Abdomen: soft, mildly TTP in LUQ without guarding, nondistended, no hepatic or splenomegaly, +BS Extremities: no edema or cyanosis. WWP. Skin: warm and dry, no rashes noted Neuro: alert and oriented x4, no focal deficits Psych: Normal affect and mood   Laboratory:  Recent Labs Lab 10/07/16 1957 10/08/16 0318  WBC 10.0 12.5*  HGB 11.8* 10.9*  HCT 36.4 33.8*  PLT 219 222    Recent Labs Lab 10/07/16 1957 10/08/16 0318 10/09/16 0347  NA 134* 142 139  K 4.0 3.2* 4.0  CL 98* 108 107  CO2 25 28 24   BUN 17 12 7   CREATININE 0.90 0.85 0.67  CALCIUM 9.4 8.9 8.9  PROT 6.9 6.1* 6.2*  BILITOT 0.3 0.5 0.4  ALKPHOS 82 69 70  ALT 9* 8* 8*  AST 16 17 17   GLUCOSE 475* 68 149*     Imaging/Diagnostic Tests: Ct Abdomen Pelvis W Contrast  Result Date: 10/07/2016 CLINICAL DATA:  56 year old female with abdominal pain and elevated LFTs. EXAM: CT ABDOMEN AND PELVIS WITH CONTRAST TECHNIQUE: Multidetector CT imaging of the abdomen and pelvis was performed using the standard  protocol following bolus administration of intravenous contrast. CONTRAST:  169mL ISOVUE-300 IOPAMIDOL (ISOVUE-300) INJECTION 61% COMPARISON:  Abdominal CT dated 01/15/2008 FINDINGS: Lower chest: The visualized lung bases are clear. No intra-abdominal free air or free fluid. Hepatobiliary: No focal liver abnormality is seen. No gallstones, gallbladder wall thickening, or biliary dilatation. Pancreas: There is inflammatory changes of the pancreas primarily involving the dx head and uncinate process of the pancreas most consistent with acute pancreatitis. Correlation with pancreatic enzymes recommended. There is no drainable fluid collection or abscess or pseudocyst. No dilatation of the main pancreatic duct. Spleen: Normal in size without focal abnormality. Adrenals/Urinary Tract: The kidneys, adrenal glands, and visualized ureters appear unremarkable. The urinary bladder is moderately distended. Air within the urinary bladder noted which may be related to recent instrumentation. Clinical correlation is recommended. Stomach/Bowel: There is moderate stool throughout the colon. No evidence of bowel obstruction or active inflammation. Normal appendix Vascular/Lymphatic: There is mild aortoiliac atherosclerotic disease. No aneurysmal dilatation or evidence of dissection. The origins of the celiac axis, SMA, IMA are patent. There is a circumaortic left renal vein anatomy. The SMV, splenic vein, and main portal vein are patent. No portal venous gas identified. No adenopathy. Reproductive: The uterus and ovaries are grossly unremarkable. Other: None Musculoskeletal: No acute or significant osseous findings. IMPRESSION: 1. Acute pancreatitis.  No abscess. 2. No bowel obstruction.  Normal appendix. 3.  Aortic Atherosclerosis (ICD10-I70.0). Electronically Signed   By: Anner Crete M.D.   On: 10/07/2016 23:07    Bufford Lope, DO 10/09/2016, 8:26 AM PGY-1, [place] Intern pager: [phone], text  pages welcome

## 2016-10-10 LAB — URINE CULTURE: Culture: >=100000 — AB

## 2016-10-10 LAB — GLUCOSE, CAPILLARY
GLUCOSE-CAPILLARY: 180 mg/dL — AB (ref 65–99)
Glucose-Capillary: 278 mg/dL — ABNORMAL HIGH (ref 65–99)

## 2016-10-10 LAB — HEMOGLOBIN A1C: Hgb A1c MFr Bld: >15.5 % — ABNORMAL HIGH (ref 4.8–5.6)

## 2016-10-10 MED ORDER — HYDROCODONE-ACETAMINOPHEN 5-325 MG PO TABS: 1.0000 | ORAL_TABLET | ORAL | 0 refills | Status: DC | PRN

## 2016-10-10 NOTE — Progress Notes (Signed)
NURSING PROGRESS NOTE  Andrea Glenn [id number] Discharge Data: 10/10/2016 3:16 PM Attending Provider: No att. providers found NGB:[id number], Grayling Congress, DO   [name] to be D/C'd Home per MD order.    All IV's will be discontinued and monitored for bleeding.  All belongings will be returned to patient for patient to take home.  Last Documented Vital Signs:  Blood pressure 135/69, pulse 82, temperature 98.2 F (36.8 C), temperature source Oral, resp. rate 18, height 5' (1.524 m), weight 55.1 kg (121 lb 6.4 oz), SpO2 100 %.  [name], MSN, RN, [employer]

## 2016-10-11 ENCOUNTER — Ambulatory Visit (INDEPENDENT_AMBULATORY_CARE_PROVIDER_SITE_OTHER): Payer: Self-pay | Admitting: Pharmacist

## 2016-10-11 ENCOUNTER — Encounter: Payer: Self-pay | Admitting: Pharmacist

## 2016-10-11 DIAGNOSIS — E1149 Type 2 diabetes mellitus with other diabetic neurological complication: Secondary | ICD-10-CM

## 2016-10-11 DIAGNOSIS — E119 Type 2 diabetes mellitus without complications: Secondary | ICD-10-CM

## 2016-10-11 DIAGNOSIS — F17200 Nicotine dependence, unspecified, uncomplicated: Secondary | ICD-10-CM

## 2016-10-11 DIAGNOSIS — E782 Mixed hyperlipidemia: Secondary | ICD-10-CM

## 2016-10-11 LAB — GLUCOSE, CAPILLARY: GLUCOSE-CAPILLARY: 165 mg/dL — AB (ref 65–99)

## 2016-10-11 MED ORDER — INSULIN DEGLUDEC 100 UNIT/ML ~~LOC~~ SOPN: 8.0000 [IU] | PEN_INJECTOR | Freq: Every day | SUBCUTANEOUS | 0 refills | Status: DC

## 2016-10-11 MED ORDER — NORTRIPTYLINE HCL 25 MG PO CAPS: 25.0000 mg | ORAL_CAPSULE | Freq: Every day | ORAL | 1 refills | Status: DC

## 2016-10-11 NOTE — Progress Notes (Signed)
    S:     Chief Complaint  Patient presents with  . Medication Management    diabetes, new patient     Patient arrives with daughter Andrea Glenn. ambulating without assistance. Patient was recently discharged from hospital on 10/09/16 for acute pancreatitis and all medications have been reviewed. Presents for diabetes evaluation, education, and management at the request of Dr. Andy Gauss while inpatient. Patient was referred on 10/09/16. Patient was last seen by Primary Care Provider on 01/23/2016 with Dr. Yisroel Ramming.   Patient reports Diabetes was diagnosed in 2008.   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 Pt reports depressive side effects with amitriptyline and suicidal thoughts with atorvastatin. Current diabetes medications include: glimepiride 4mg  once daily Current hypertension medications include: none  Patient denies hypoglycemic events.  Patient reported dietary habits: Eats 2 meals/day.  -Reports mostly rice and pasta and some vegetables while minimizing meat intake due to gout flares (pt currently does not have a gout diagnosis)  Patient reported exercise habits: reports minimal due to fatigue   Patient reports nocturia. 4-5x a night Patient reports neuropathy.  O:  Physical Exam  Constitutional: She appears well-developed and well-nourished.    Review of Systems  All other systems reviewed and are negative.  Lab Results  Component Value Date   HGBA1C >15.5 (H) 10/08/2016   Vitals:   10/11/16 1107  BP: 100/60   Home fasting CBG: 400-500s 10 year ASCVD risk: 24.1%  A/P: Diabetes longstanding currently  uncontrolled due to last A1C 15.5% (6/22). Patient denies hypoglycemic events and is able to verbalize appropriate hypoglycemia management plan. Patient reports adherence with medication. Control is suboptimal due to suboptimal therapy. Has tried metformin in the past, but experienced significant diarrhea. Currently on glimepiride 4mg  once daily. Was reluctant to use insulin at first, but was able to demonstrate understanding of insulin techniques and disposal. Will be receiving insulin samples from [place]. Next A1C anticipated 12/2016. -Stop glimepiride. -Start insulin degludec Tyler Aas) 8 units once daily -Counseled on limiting carbs as well as hypoglycemia  ASCVD risk greater than 7.5%. Currently on atorvastatin 40mg  once daily, but reports suicidal thoughts with this medication. -Stop atorvastatin. Consider a statin alternative at next visit. -Consider starting aspirin 81mg  at next visit.  Peripheral neuropathy longstanding currently uncontrolled due to uncontrolled diabetes. Currently taking amitriptyline 25mg  qhs, but reports depressive symptoms with this medication.  -Stop amitriptyline. -Start nortriptyline 25mg  qhs for 1 week, then increase to 50mg  qhs.  Tobacco use currently uncontrolled due to stressors. Reports 1 pack lasting 2.5 days. Quit 8 years ago, but restarted due to stressors and work environment (used to work in [employer], trade union). Denies currently using nicotine patch 7mg  once daily. -Nortriptyline titration to help with cravings -Goal 5-6 cigarettes/day  Written patient instructions provided.  Total time in face to face counseling 40 minutes.   Follow up in Pharmacist Clinic Visit in 3 weeks (July 19th). Patient seen with Shelle Iron, PharmD Candidate

## 2016-10-11 NOTE — Assessment & Plan Note (Signed)
Diabetes longstanding currently uncontrolled due to last A1C 15.5% (6/22). Patient denies hypoglycemic events and is able to verbalize appropriate hypoglycemia management plan. Patient reports adherence with medication. Control is suboptimal due to suboptimal therapy. Has tried metformin in the past, but experienced significant diarrhea. Currently on glimepiride 4mg  once daily. Was reluctant to use insulin at first, but was able to demonstrate understanding of insulin techniques and disposal. Will be receiving insulin samples from [place]. Next A1C anticipated 12/2016. -Stop glimepiride. -Start insulin degludec Tyler Aas) 8 units once daily -Counseled on limiting carbs as well as hypoglycemia

## 2016-10-11 NOTE — Patient Instructions (Addendum)
Thanks for coming to see [place] today. Here are some things we discussed today:  1. Start using your insulin degludec Tyler Aas) injection 8 units once daily for your diabetes. Stop taking your glimepiride (Amaryl) 4mg  once daily. If your blood sugar gets down to 60 or you have symptoms of low blood sugar (blurred vision, dizziness), drink 2. Please stop taking your amitriptyline (Elavil) and atorvastatin (Lipitor).  3. Start your nortriptyline 25mg  once daily. Then after 1 week, increase to 50 mg once daily. 4. We will assess your goal of cutting down your use to 5-6 cigarettes a day.  Please come back to see [place] in 3 weeks (July 19th) Please continue checking your blood sugar We will call you in a week to see how your sugars are.

## 2016-10-11 NOTE — Assessment & Plan Note (Signed)
Peripheral neuropathy longstanding currently uncontrolled due to uncontrolled diabetes. Currently taking amitriptyline 25mg  qhs, but reports depressive symptoms with this medication.  -Stop amitriptyline. -Start nortriptyline 25mg  qhs for 1 week, then increase to 50mg  qhs.

## 2016-10-11 NOTE — Assessment & Plan Note (Signed)
ASCVD risk greater than 7.5%. Currently on atorvastatin 40mg  once daily, but reports suicidal thoughts with this medication. -Stop atorvastatin. Consider a statin alternative at next visit. -Consider starting aspirin 81mg  at next visit.

## 2016-10-11 NOTE — Assessment & Plan Note (Signed)
Tobacco use currently uncontrolled due to stressors. Reports 1 pack lasting 2.5 days. Quit 8 years ago, but restarted due to stressors and work environment (used to work in [employer], trade union). Denies currently using nicotine patch 7mg  once daily. -Nortriptyline titration to help with cravings -Goal 5-6 cigarettes/day

## 2016-10-15 ENCOUNTER — Encounter: Payer: Self-pay | Admitting: Family Medicine

## 2016-10-15 ENCOUNTER — Ambulatory Visit (INDEPENDENT_AMBULATORY_CARE_PROVIDER_SITE_OTHER): Payer: Self-pay | Admitting: Family Medicine

## 2016-10-15 DIAGNOSIS — K859 Acute pancreatitis without necrosis or infection, unspecified: Secondary | ICD-10-CM

## 2016-10-15 DIAGNOSIS — E119 Type 2 diabetes mellitus without complications: Secondary | ICD-10-CM

## 2016-10-15 DIAGNOSIS — H5712 Ocular pain, left eye: Secondary | ICD-10-CM

## 2016-10-15 NOTE — Patient Instructions (Signed)
Thank you for coming in to see [place] today. Please see below to review our plan for today's visit.  1. The pain in the left eye should improve over the next few days. If he developed worsening vision, headache, or begin to vomit, good to the emergency room.  2. It is important you discontinue the glipizide is recommended by the pharmacy team. We will continue your Tresiba tomorrow with 8 units daily. Please make sure you record your blood sugar levels. This should be done when you first wake up and after meals a proximal plan 1-2 hours. 3. We have started the process with you obtaining an orange card so that you can afford her medications. Please make sure you follow-up with this. 4. Return to the clinic in 2 weeks to discuss over due health maintenance.  Please call the clinic at [phone] if your symptoms worsen or you have any concerns. It was my pleasure to see you. -- Harriet Butte, [place], PGY-1
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
## 2016-10-15 NOTE — Assessment & Plan Note (Addendum)
Chronic. Poorly controlled. A1c >15.5 on 6/22. Was seen by pharm clinic and told to discontinue Glipizide but still taking. Started on Tresiba 8 units. Does not understand glucometer. Making efforts to decrease carbohydrates.  --Discontinue Glipizide, increase Tresiba to 10 units daily tomorrow --Was seen by Dr. Valentina Lucks and instructed how to use glucometer with teach back method --Will need to consider ASA and statin therapy during next visit when no acute events --Initiated orange card meeting to begin paperwork for financial assisatnce --RTC in 2 weeks

## 2016-10-15 NOTE — Assessment & Plan Note (Signed)
Resolved. Hospitalized 6/23-6/25 for acute mild case thought to be 2/2 poorly controlled T2DM. Asym today. Tolerating solids. --See diabetes for plan --Discuss importance of maintaining solid diet and adequate hydration
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
## 2016-11-04 ENCOUNTER — Ambulatory Visit: Payer: Self-pay | Admitting: Pharmacist

## 2016-11-11 NOTE — Progress Notes (Signed)
Subjective:   Patient ID: Andrea Glenn    DOB: [birth year], 56 y.o. female   MRN: [id number]  CC: "Diabetes"  HPI: Andrea Glenn is a 56 y.o. female who presents to clinic today for diabetes. Problems discussed today are as follows:  Diabetes: Patient failed to follow-up with Dr. Valentina Lucks for appointment scheduled 7/19. Patient states she did not increase her to receive a from 8 units to 10 units in slices. Her blood sugars continue to remain to 10-247 with high 525 and no lows. She continues to have neuropathy of her feet bilaterally. Patient does endorse chronic bilateral blurred vision. Has not seen a high doctor in several years. ROS: Denies nausea or vomiting, decreased motor function.  Smoking: Patient with 20-pack-year history currently smoking 1/2 pack per day. She has a previous attempt at smoking cessation for 2 years in 1996 but began smoking again after a family member's death. ROS: Denies chest pain, dyspnea, cough, unexplained weight loss.  Hyperlipidemia: Patient previously on atorvastatin during hospitalization but discontinued due to feelings of depression and suicide. Patient states she cannot afford paying out of pocket for $10 per month for statin.  Complete ROS performed, see HPI for pertinent.  [place]: Poorly controlled T2DM, recent pancreatitis, tobacco use disorder, RLS, HLD. Surgical shoulder and foot surgery, c-sec, back surgery. Family mother CKD, DM, thyroid disease, HTN, HF, father CKD, sister cancer, brother stroke. Smoking status reviewed. Medications reviewed.  Objective:   BP (!) 86/52   Pulse 97   Temp 98.7 F (37.1 C) (Oral)   Ht 5' 4.5" (1.638 m)   Wt 122 lb 3.2 oz (55.4 kg)   SpO2 100%   BMI 20.65 kg/m  Vitals and nursing note reviewed.  General: well nourished, well developed, in no acute distress with non-toxic appearance HEENT: normocephalic, atraumatic, moist mucous membranes CV: regular rate and rhythm without murmurs, rubs, or gallops,  no lower extremity edema Lungs: clear to auscultation bilaterally with normal work of breathing Abdomen: soft, non-tender, non-distended, normoactive bowel sounds Skin: warm, dry, no rashes or lesions, cap refill < 2 seconds Extremities: warm and well perfused, normal tone, diabetic foot exam performed without deformities, decrease sensation on left heel and right hallux and heel  Assessment & Plan:   Tobacco use disorder Chronic. Currently smoking 1/2 packs per day. Patient states she is not currently interested in cessation understands the risks. --Will consider discussing this at a future visit --Provided 1-800-QUIT-NOW for free smoking cessation resources  Uncontrolled type 2 diabetes mellitus with peripheral neuropathy (HCC) Chronic. Poorly controlled with A1c >15.5 on 10/08/2016. Patient instructed to increase Tresiba from 8 units to 10 units after discontinuation of glipizide at last visit, however patient did not do so. Patient also scheduled to follow-up with Dr. Valentina Lucks for additional therapy however patient missed appointment. --Patient structures to reschedule appointment with Dr. Valentina Lucks for diabetes management --Increase Tresiba to 10 units daily --Patient working towards obtaining orange card given lack of insurance, will pursue health maintenance once obtained  Hyperlipidemia associated with type 2 diabetes mellitus ([place]) Chronic. Has underlying uncontrolled diabetes mellitus. Previously trialed on atorvastatin and hospital but discontinued due to patient endorsing side effects of depression and suicidal thoughts. Patient states she cannot afford $10 medication for 1 month supply of statin therapy. --Given information to contact [place] for medical assistance program --Would benefit from high intensity statin such as rosuvastatin, however pravastatin is a cheaper option for and should be considered in the future  No orders of the defined types were placed  in this encounter.  No orders of the defined types were placed in this encounter.   Harriet Butte, [place], PGY-2 11/12/2016 2:10 PM

## 2016-11-12 ENCOUNTER — Encounter: Payer: Self-pay | Admitting: Family Medicine

## 2016-11-12 ENCOUNTER — Ambulatory Visit (INDEPENDENT_AMBULATORY_CARE_PROVIDER_SITE_OTHER): Payer: Self-pay | Admitting: Family Medicine

## 2016-11-12 DIAGNOSIS — E1165 Type 2 diabetes mellitus with hyperglycemia: Secondary | ICD-10-CM

## 2016-11-12 DIAGNOSIS — E1142 Type 2 diabetes mellitus with diabetic polyneuropathy: Secondary | ICD-10-CM

## 2016-11-12 DIAGNOSIS — E1169 Type 2 diabetes mellitus with other specified complication: Secondary | ICD-10-CM

## 2016-11-12 DIAGNOSIS — F17200 Nicotine dependence, unspecified, uncomplicated: Secondary | ICD-10-CM

## 2016-11-12 DIAGNOSIS — IMO0002 Reserved for concepts with insufficient information to code with codable children: Secondary | ICD-10-CM

## 2016-11-12 DIAGNOSIS — E785 Hyperlipidemia, unspecified: Secondary | ICD-10-CM

## 2016-11-12 NOTE — Assessment & Plan Note (Addendum)
Chronic. Has underlying uncontrolled diabetes mellitus. Previously trialed on atorvastatin and hospital but discontinued due to patient endorsing side effects of depression and suicidal thoughts. Patient states she cannot afford $10 medication for 1 month supply of statin therapy. --Given information to contact Andrea Glenn for medical assistance program --Would benefit from high intensity statin such as rosuvastatin, however pravastatin is a cheaper option for and should be considered in the future

## 2016-11-12 NOTE — Assessment & Plan Note (Addendum)
Chronic. Poorly controlled with A1c >15.5 on 10/08/2016. Patient instructed to increase Tresiba from 8 units to 10 units after discontinuation of glipizide at last visit, however patient did not do so. Patient also scheduled to follow-up with Dr. Valentina Lucks for additional therapy however patient missed appointment. --Patient structures to reschedule appointment with Dr. Valentina Lucks for diabetes management --Increase Tresiba to 10 units daily --Patient working towards obtaining orange card given lack of insurance, will pursue health maintenance once obtained

## 2016-11-12 NOTE — Patient Instructions (Signed)
Thank you for coming in to see Andrea Glenn today. Please see below to review our plan for today's visit.  1. The most important thing for you to schedule an appointment with Dr. Valentina Lucks for your diabetes. In the meantime, increase your Tresiba to 10 units daily. 2. I would like you to start a medication for your cholesterol. If you're having difficulty affording medication, please call the [place] at [phone] and ask to be established with the medication assistance program (MAP). 3. Once you having orange card established, we can continue to pursue other health maintenance.  Return to clinic in 1 month.  Please call the clinic at [phone] if your symptoms worsen or you have any concerns. It was my pleasure to see you. -- Harriet Butte, [place], PGY-2

## 2016-11-12 NOTE — Assessment & Plan Note (Addendum)
Chronic. Currently smoking 1/2 packs per day. Patient states she is not currently interested in cessation understands the risks. --Will consider discussing this at a future visit --Provided 1-800-QUIT-NOW for free smoking cessation resources

## 2016-11-17 ENCOUNTER — Ambulatory Visit (INDEPENDENT_AMBULATORY_CARE_PROVIDER_SITE_OTHER): Payer: Self-pay | Admitting: Internal Medicine

## 2016-11-17 VITALS — BP 130/76 | HR 96 | Temp 98.1°F

## 2016-11-17 DIAGNOSIS — IMO0002 Reserved for concepts with insufficient information to code with codable children: Secondary | ICD-10-CM

## 2016-11-17 DIAGNOSIS — E1165 Type 2 diabetes mellitus with hyperglycemia: Secondary | ICD-10-CM

## 2016-11-17 DIAGNOSIS — E1142 Type 2 diabetes mellitus with diabetic polyneuropathy: Secondary | ICD-10-CM

## 2016-11-17 DIAGNOSIS — R739 Hyperglycemia, unspecified: Secondary | ICD-10-CM

## 2016-11-17 DIAGNOSIS — E119 Type 2 diabetes mellitus without complications: Secondary | ICD-10-CM

## 2016-11-17 LAB — GLUCOSE, POCT (MANUAL RESULT ENTRY): POC GLUCOSE: 496 mg/dL — AB (ref 70–99)

## 2016-11-17 MED ORDER — INSULIN DEGLUDEC 100 UNIT/ML ~~LOC~~ SOPN: 10.0000 [IU] | PEN_INJECTOR | Freq: Every day | SUBCUTANEOUS | 0 refills | Status: DC

## 2016-11-17 NOTE — Progress Notes (Signed)
[name] Progress Note  Subjective:  Andrea Glenn is a 56 y.o. female with history of pancreatitis with recent hospitalizations, poorly controlled T2DM with peripheral neuropathy, restless leg syndrome and tobacco abuse. She presents for acute visit for running out of her insulin.  #Medication issue: - Patient had been started on tresiba 8 U daily. This was increased to 10 U in June. She had been using a sample, as she is still in the process of applying for financial assistance with the [employer].  - Patient came to clinic to ask for medication but began feeling dizzy with shaking and sweating at the front desk.  - She says her blood sugars have been running between 180 and 200. Last night her CBG was about 230. - Reports eating a lot of watermelon last night. Had cornflakes for breakfast prior to taking tresiba. Took this late in the am because she woke up late and then had to take her father to a doctor's appointment. Only able to give herself 2 U of tresiba this am before realizing she had run out. Says she can feel bad/tremulous if sugars are too high or too low.  - Does not take tresiba at a regular time--sometimes takes tresiba before eating, sometimes after--though usually takes it in the morning.  - Has not missed doses of medication, though was not aware tresiba needed to be refrigerated.  ROS: No n/v, no abdominal pain.   Social: Current smoker  Allergies  Allergen Reactions  . Atorvastatin Other (See Comments)    Suicidal thoughts  . Amitriptyline Other (See Comments)    Depression  . Metformin And Related Diarrhea    Objective: Blood pressure 130/76, pulse 96, temperature 98.1 F (36.7 C), temperature source Oral.  Constitutional: Thin woman in NAD HENT: MMM Cardiovascular: RRR, S1, S2, no m/r/g.  Pulmonary/Chest: Effort normal and breath sounds normal. No respiratory distress.  Abdominal: Soft. +BS, NT, ND Neurological: AOx3, no focal  deficits. Skin: Skin is warm and dry. No rash noted.  Psychiatric: Normal mood and affect.  Vitals reviewed  CBG 496 11/17/16 Last hgb A1c > 15.5 on 10/08/16  Assessment/Plan: Hyperglycemia - In the setting of missed medication. Precepted patient with Dr. Raynelle Bring.  - Provided sample of tresiba. Patient administered to herself. Watched patient for about 20 minutes afterwards, and she felt much better.  - Gave strict return precautions and provided education about DKA. Advised patient to go the the ED later today if her next CBG remains elevated > 400, if she has n/v, abdominal pain, or feels persistently weak. Patient expressed understanding.  - Deferred discussion about need to start statin, as does not feel well today and still not enrolled in [place] as planned with Dr. Valentina Lucks on 8/3.  Future Appointments Date Time Provider [place]  11/19/2016 9:00 AM Leavy Cella [facility]    Olene Floss, MD [facility], PGY-3

## 2016-11-17 NOTE — Patient Instructions (Signed)
Andrea Glenn,  Please keep your follow-up appointment with Dr. Valentina Lucks later this week.  Continue tresiba at around the same time every day.  If your blood sugar has not improved by this evening (less than 400), if you have nausea, vomiting or abdominal pain, then go to the Emergency Room as you could be developing diabetic ketoacidosis.  Best, Dr. Ola Spurr   Diabetic Ketoacidosis Diabetic ketoacidosis is a life-threatening complication of diabetes. If it is not treated, it can cause severe dehydration and organ damage and can lead to a coma or death. What are the causes? This condition develops when there is not enough of the hormone insulin in the body. Insulin helps the body to break down sugar for energy. Without insulin, the body cannot break down sugar, so it breaks down fats instead. This leads to the production of acids that are called ketones. Ketones are poisonous at high levels. This condition can be triggered by:  Stress on the body that is brought on by an illness.  Medicines that raise blood glucose levels.  Not taking diabetes medicine.  What are the signs or symptoms? Symptoms of this condition include:  Fatigue.  Weight loss.  Excessive thirst.  Light-headedness.  Fruity or sweet-smelling breath.  Excessive urination.  Vision changes.  Confusion or irritability.  Nausea.  Vomiting.  Rapid breathing.  Abdominal pain.  Feeling flushed.  How is this diagnosed? This condition is diagnosed based on a medical history, a physical exam, and blood tests. You may also have a urine test that checks for ketones. How is this treated? This condition may be treated with:  Fluid replacement. This may be done to correct dehydration.  Insulin injections. These may be given through the skin or through an IV tube.  Electrolyte replacement. Electrolytes, such as potassium and sodium, may be given in pill form or through an IV tube.  Antibiotic medicines.  These may be prescribed if your condition was caused by an infection.  Follow these instructions at home: Eating and drinking  Drink enough fluids to keep your urine clear or pale yellow.  If you cannot eat, alternate between drinking fluids with sugar (such as juice) and salty fluids (such as broth or bouillon).  If you can eat, follow your usual diet and drink sugar-free liquids, such as water. Other Instructions   Take insulin as directed by your health care provider. Do not skip insulin injections. Do not use expired insulin.  If your blood sugar is over 240 mg/dL, monitor your urine ketones every 4-6 hours.  If you were prescribed an antibiotic medicine, finish all of it even if you start to feel better.  Rest and exercise only as directed by your health care provider.  If you get sick, call your health care provider and begin treatment quickly. Your body often needs extra insulin to fight an illness.  Check your blood glucose levels regularly. If your blood glucose is high, drink plenty of fluids. This helps to flush out ketones. Contact a health care provider if:  Your blood glucose level is too high or too low.  You have ketones in your urine.  You have a fever.  You cannot eat.  You cannot tolerate fluids.  You have been vomiting for more than 2 hours.  You continue to have symptoms of this condition.  You develop new symptoms. Get help right away if:  Your blood glucose levels continue to be high (elevated).  Your monitor reads "high" even when you are  taking insulin.  You faint.  You have chest pain.  You have trouble breathing.  You have a sudden, severe headache.  You have sudden weakness in one arm or one leg.  You have sudden trouble speaking or swallowing.  You have vomiting or diarrhea that gets worse after 3 hours.  You feel severely fatigued.  You have trouble thinking.  You have abdominal pain.  You are severely dehydrated.  Symptoms of severe dehydration include: ? Extreme thirst. ? Dry mouth. ? Blue lips. ? Cold hands and feet. ? Rapid breathing. This information is not intended to replace advice given to you by your health care provider. Make sure you discuss any questions you have with your health care provider. Document Released: 04/02/2000 Document Revised: 09/11/2015 Document Reviewed: 03/13/2014 Elsevier Interactive Patient Education  2017 [employer].

## 2016-11-17 NOTE — Progress Notes (Signed)
POC

## 2016-11-18 DIAGNOSIS — R739 Hyperglycemia, unspecified: Secondary | ICD-10-CM | POA: Insufficient documentation

## 2016-11-18 NOTE — Assessment & Plan Note (Addendum)
-   In the setting of missed medication. Precepted patient with Dr. Raynelle Bring.  - Provided sample of tresiba. Patient administered to herself. Watched patient for about 20 minutes afterwards, and she felt much better.  - Gave strict return precautions and provided education about DKA. Advised patient to go the the ED later today if her next CBG remains elevated > 400, if she has n/v, abdominal pain, or feels persistently weak. Patient expressed understanding.  - Deferred discussion about need to start statin, as does not feel well today and still not enrolled in [facility]

## 2016-11-19 ENCOUNTER — Encounter: Payer: Self-pay | Admitting: Pharmacist

## 2016-11-19 ENCOUNTER — Ambulatory Visit (INDEPENDENT_AMBULATORY_CARE_PROVIDER_SITE_OTHER): Payer: Self-pay | Admitting: Pharmacist

## 2016-11-19 DIAGNOSIS — IMO0002 Reserved for concepts with insufficient information to code with codable children: Secondary | ICD-10-CM

## 2016-11-19 DIAGNOSIS — F17200 Nicotine dependence, unspecified, uncomplicated: Secondary | ICD-10-CM

## 2016-11-19 DIAGNOSIS — R739 Hyperglycemia, unspecified: Secondary | ICD-10-CM

## 2016-11-19 DIAGNOSIS — E1165 Type 2 diabetes mellitus with hyperglycemia: Secondary | ICD-10-CM

## 2016-11-19 DIAGNOSIS — E1142 Type 2 diabetes mellitus with diabetic polyneuropathy: Secondary | ICD-10-CM

## 2016-11-19 DIAGNOSIS — E119 Type 2 diabetes mellitus without complications: Secondary | ICD-10-CM

## 2016-11-19 MED ORDER — INSULIN DEGLUDEC 100 UNIT/ML ~~LOC~~ SOPN: 14.0000 [IU] | PEN_INJECTOR | Freq: Every day | SUBCUTANEOUS | 11 refills | Status: DC

## 2016-11-19 MED ORDER — DULAGLUTIDE 0.75 MG/0.5ML ~~LOC~~ SOAJ: 0.7500 mg | SUBCUTANEOUS | 11 refills | Status: DC

## 2016-11-19 NOTE — Progress Notes (Signed)
    S:     Chief Complaint  Patient presents with  . Medication Management    diabetes    Patient arrives in good spirits, ambulates well without assistance.  Presents for diabetes evaluation, education, and management at the request of Dr. Yisroel Ramming. Patient here today to follow up from previous insulin initiation on 10/11/16  Patient was last seen by Primary Care Provider on 11/12/16.   Patient has worked very hard on [employer], history of extreme weight loss (size 22 to size 6). Reports some high BG when stressed, 425-565. When she first started [place], her sugars were mostly in 200s (202-247). Currently checking BG before breakfast or when feeling off. She ran out of [place] recently and came back to clinic to get a new pen.  Patient reports denial from Medicaid, working on orange card.   Patient reports Diabetes was diagnosed in 2008.    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   Current diabetes medications include: Tresiba 10 units qAM ~10am  Patient denies hypoglycemic events. Knows to use candy (3 musketeers) to treat lows.  Patient reported dietary habits: Eats 3 meals/day Breakfast: cereal (corn flakes, rice krispies, cheerios), 2% or whole milk, splenda Lunch: 1/2 cup peaches/fruit cocktail (sugar-free, lite-syrup), peanut butter on wheat bread or crackers, tomato on wheat w/ fat-free ranch Dinner: biggest meal - 1/2 cup pasta, very rarely potatoes, frozen dinners (low sodium), kale/spinach/arugala salad, grilled chicken, baked fish   Patient reports neuropathy.   O:  Physical Exam    Constitutional: She appears well-developed and well-nourished.  Vitals reviewed.   Review of Systems  All other systems reviewed and are negative.    Lab Results  Component Value Date   HGBA1C >15.5 (H) 10/08/2016   There were no vitals filed for this visit.  Home fasting CBG: 202-240s Readings throughout day 200s-300s High (up to 565) when stressed   A/P: Diabetes longstanding currently uncontrolled. Patient denies hypoglycemic events and is able to verbalize appropriate hypoglycemia management plan. Patient reports adherence with medication. Control is suboptimal due to suboptimal medications. Educated on Jardiance vs.Trulicity, decided on Trulicity which will be obtained from MAP. - Increase Tresiba to 14 units daily before breakfast - Educated on use Trulicity 0.98 mg [place] once weekly which will not be started until she receives supply from MAP in 3-4 weeks.  At that time, we will see patient, evaluated CBGs and adjust insulin as necessary.   Next A1C anticipated September/October.    Chronic tobacco use, currently smoking 8-9 cigarettes per day. Contemplative about quitting in the near future, interested in use of patches. Suggested 14 mg/day patches. Short term plan to reduce to ~7 cigarettes per day, patient agreeable. Re-evaluate next visit.   Written patient instructions provided.  Total time in face to face counseling 45 minutes.   Follow up in Pharmacist Clinic Visit 1 month. Patient seen with  Cleotis Lema, PharmD Candidate, and Charlene Brooke, PharmD PGY-1 Resident.

## 2016-11-19 NOTE — Patient Instructions (Addendum)
Thank you for coming in today! For next month:  1. Increase your [place] A[place] to 14 units every morning before breakfast.   2. Go to [place] and fill out paperwork for both [place] and Trulicity. When Trulicity comes in, bring to follow-up visit with Dr. Valentina Lucks before starting injections.  Follow up in 1 month with Dr. Valentina Lucks. If you do not have medications from pharmacy yet, call to reschedule for about 1 week later.

## 2016-11-19 NOTE — Assessment & Plan Note (Signed)
Diabetes longstanding currently uncontrolled. Patient denies hypoglycemic events and is able to verbalize appropriate hypoglycemia management plan. Patient reports adherence with medication. Control is suboptimal due to suboptimal medications. Educated on Jardiance vs.Trulicity, decided on Trulicity which will be obtained from [facility]. - Increase Tresiba to 14 units daily before breakfast - Educated on use Trulicity 1.51 mg [place] once weekly which will not be started until she receives supply from [facility] in 3-4 weeks.  At that time, we will see patient, evaluated CBGs and adjust insulin as necessary.

## 2016-11-19 NOTE — Assessment & Plan Note (Signed)
Chronic tobacco use, currently smoking 8-9 cigarettes per day. Contemplative about quitting in the near future, interested in use of patches. Suggested 14 mg/day patches. Short term plan to reduce to ~7 cigarettes per day, patient agreeable. Re-evaluate next visit

## 2016-11-22 NOTE — Progress Notes (Signed)
Patient ID: Andrea Glenn, female   DOB: October 27, 1960, 56 y.o.   MRN: [id number] Reviewed: Agree with Dr. Graylin Shiver documentation and management.

## 2016-11-30 ENCOUNTER — Other Ambulatory Visit: Payer: Self-pay | Admitting: *Deleted

## 2016-11-30 DIAGNOSIS — E119 Type 2 diabetes mellitus without complications: Secondary | ICD-10-CM

## 2016-11-30 MED ORDER — INSULIN DEGLUDEC 100 UNIT/ML ~~LOC~~ SOPN: 14.0000 [IU] | PEN_INJECTOR | Freq: Every day | SUBCUTANEOUS | 11 refills | Status: DC

## 2016-11-30 NOTE — Telephone Encounter (Signed)
Patient called stating she will be out of her Tyler Aas for her next appointment 12/22/16.  Pt stated her dosage was increased. Advised patient that a refill was sent to [facility] on 11/19/16. Pt stated she did not have an application completed through [facility].  Dr. Valentina Lucks gave patient a sample at last office visit until her insurance if straight. Pt has not received anything from Medicaid at this time. Can another sample be given to patient? Please advise.  Derl Barrow, RN

## 2016-12-01 MED ORDER — INSULIN DEGLUDEC 100 UNIT/ML ~~LOC~~ SOPN: 14.0000 [IU] | PEN_INJECTOR | Freq: Every day | SUBCUTANEOUS | 11 refills | Status: DC

## 2016-12-01 NOTE — Telephone Encounter (Signed)
Left voice message for patient informing her she could pick up a sample of Tresiba.  Derl Barrow, RN

## 2016-12-01 NOTE — Addendum Note (Signed)
Addended by: Derl Barrow on: 12/01/2016 10:34 AM   Modules accepted: Orders

## 2016-12-22 ENCOUNTER — Other Ambulatory Visit: Payer: Self-pay | Admitting: Family Medicine

## 2016-12-22 NOTE — Telephone Encounter (Signed)
Patient seen in waiting room  Provided sample pen of Tresiba.  Patient reports CBGs doing better than in the past.   Medication Samples have been provided to the patient.  Drug name: Sandrea Matte: 1  LOT: [id number]  Exp.Date: 09/17/2018  The patient has been instructed regarding the correct time, dose, and frequency of taking this medication, including desired effects and most common side effects.   Janeann Forehand 12:15PM

## 2016-12-22 NOTE — Telephone Encounter (Signed)
She needs a refill on her insulin pen.  She is waiting here for DR. Kovall to see him at 12:30 about this (per Ander Purpura).

## 2016-12-30 ENCOUNTER — Ambulatory Visit (INDEPENDENT_AMBULATORY_CARE_PROVIDER_SITE_OTHER): Payer: Self-pay | Admitting: Pharmacist

## 2016-12-30 ENCOUNTER — Encounter: Payer: Self-pay | Admitting: Pharmacist

## 2016-12-30 DIAGNOSIS — E1142 Type 2 diabetes mellitus with diabetic polyneuropathy: Secondary | ICD-10-CM

## 2016-12-30 DIAGNOSIS — IMO0002 Reserved for concepts with insufficient information to code with codable children: Secondary | ICD-10-CM

## 2016-12-30 DIAGNOSIS — E119 Type 2 diabetes mellitus without complications: Secondary | ICD-10-CM

## 2016-12-30 DIAGNOSIS — R600 Localized edema: Secondary | ICD-10-CM

## 2016-12-30 DIAGNOSIS — F17200 Nicotine dependence, unspecified, uncomplicated: Secondary | ICD-10-CM

## 2016-12-30 DIAGNOSIS — E1165 Type 2 diabetes mellitus with hyperglycemia: Secondary | ICD-10-CM

## 2016-12-30 MED ORDER — INSULIN DEGLUDEC 100 UNIT/ML ~~LOC~~ SOPN: 17.0000 [IU] | PEN_INJECTOR | Freq: Every day | SUBCUTANEOUS | 11 refills | Status: DC

## 2016-12-30 MED ORDER — HYDROCHLOROTHIAZIDE 25 MG PO TABS: 12.5000 mg | ORAL_TABLET | Freq: Every day | ORAL | 3 refills | Status: DC

## 2016-12-30 NOTE — Assessment & Plan Note (Signed)
#  Diabetes Diabetes longstanding X 10 years currently with improved glycemic control after initiation of insulin therapy. Patient denies hypoglycemic events and is able to verbalize appropriate hypoglycemia management plan. Patient reports adherence with medication. Control is suboptimal due to dietary indiscretion, sedentary lifestyle. -Increased dose of basal insulin Tresiba (insulin degludec) to 17 units once daily.

## 2016-12-30 NOTE — Progress Notes (Signed)
Saw patient to evaluate volume overload. No hepatojugular reflex. Could palpate DP pulse on left but not right. Lungs CTAB. 2+ pedal edema and 1+ LE edema to knee. Negative Homan's sign bilaterally and no erythema or increased warmth of legs. No murmur appreciated on cardiac exam.

## 2016-12-30 NOTE — Assessment & Plan Note (Signed)
#  Smoking cessation Patient still smoking 6 cigarettes/day however has achieved her short term goal of <7 cigarettes/day set at last visit.  -Congratulated her on her progress -Set new goal of 5 or less/day by the time she sees [place] next.

## 2016-12-30 NOTE — Progress Notes (Signed)
S:     Chief Complaint  Patient presents with  . Medication Management    Diabetes    Patient arrives in good spirits with new complaint of increased swelling and weight gain. Presents for diabetes evaluation, education, and management in follow up of recent pharmacist clinic visit. Patient was referred on 11/17/2016.  Patient was last seen by Primary Care Provider on 11/12/2016.   Patient states that she was unable to get Trulicity at [facility]. Smoking ~ 6 cigarettes/day. 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 Complains of swelling in her feet that resolves with elevation overnight.   Patient reports Diabetes was diagnosed in 2008.   Patient reports adherence with medications. Has taken all doses of Tresiba since last visit.  Current diabetes medications include: Tresiba 14 units once daily.  Patient denies hypoglycemic events.  Patient reported dietary habits: Eats multiple meals/day Breakfast: toast, applesauce Lunch: PB crackers Dinner: shrimp and imitation crabmeat + pasta Snacks: Lemon shake, potato chips, nuts, oreos Drinks: water, unsweet tea  Patient reported exercise habits: none outside of work   Patient reports nocturia.  However only once versus previously multiple times.  Patient denies neuropathy. Patient denies visual changes. Patient reports self foot exams.   O:  Physical Exam  Constitutional: She appears well-developed and well-nourished.  Musculoskeletal: She exhibits edema.  Bilateral painful 2+pitting edema Also evaluated by Dr. Jerrel Ivory (previously seen this patient in the past)  Review of Systems  Cardiovascular: Positive for leg swelling.  All other systems reviewed and are negative.    Lab Results  Component Value Date   HGBA1C >15.5 (H) 10/08/2016   Vitals:   12/30/16 1545  BP: (!) 114/92  Pulse: 92    Home fasting CBG: 100-200, lowest she has seen = 179 ASCVD risk score ~  12%  A/P: #Diabetes Diabetes longstanding X 10 years currently with improved glycemic control after initiation of insulin therapy. Patient denies hypoglycemic events and is able to verbalize appropriate hypoglycemia management plan. Patient reports adherence with medication. Control is suboptimal due to dietary indiscretion, sedentary lifestyle. -Increased dose of basal insulin Tresiba (insulin degludec) to 17 units once daily.  -Called [facility] pharmacy - state that they are still accepting applications and perhaps patient came outside of enrollment times (T,W,Th 9-11 AM and 2-4 PM). Called patient after visit and asked that she try to fill out application again. She states she can go this Tuesday. -Next A1C anticipated 01/08/2017 or later.     -ASCVD risk greater than 7.5%. Consider initiation of aspirin and high intensity statin at next visit.    #Smoking cessation Patient still smoking 6 cigarettes/day however has achieved her short term goal of <7 cigarettes/day set at last visit.  -Congratulated her on her progress -Set new goal of 5 or less/day by the time she sees [place] next.  #Lower extremity edema in setting of elevated diastolic blood pressure without dx of HTN Likely from loss of hyperglycemic diuresis, venous stasis dermatitis/venous insufficiency given recent job start requiring long periods of standing, h/o morbid obesity and tobacco abuse. Patient seen with Dr. Ola Spurr who approved the following medication change and requested that the patient be seen soon for PAD assessment with Rx clinic. -Start HCTZ 12.5 mg PO daily. Patient educated on purpose, proper use and potential adverse effects of HCTZ.  Following instruction patient verbalized understanding of treatment plan.  -Counseled on next steps for evaluating PAD  and if possible,  the potential benefit of compression stockings while working.  Written patient instructions provided.  Total time in face to face counseling 60 minutes.     Follow up in Pharmacist Clinic Visit 2 weeks.   Patient seen with Deirdre Pippins, PharmD PGY-2 Resident

## 2016-12-30 NOTE — Assessment & Plan Note (Signed)
#  Lower extremity edema in setting of elevated diastolic blood pressure without dx of HTN Likely from loss of hyperglycemic diuresis, venous stasis dermatitis/venous insufficiency given recent job start requiring long periods of standing, h/o morbid obesity and tobacco abuse. Patient seen with Dr. Ola Spurr who approved the following medication change and requested that the patient be seen soon for PAD assessment with Rx clinic. -Start HCTZ 12.5 mg PO daily. Patient educated on purpose, proper use and potential adverse effects of HCTZ.  Following instruction patient verbalized understanding of treatment plan.  -Counseled on next steps for evaluating PAD and if possible,  the potential benefit of compression stockings while working.

## 2016-12-30 NOTE — Patient Instructions (Addendum)
Thank you for coming to see Andrea Glenn today! You have made significant progress both with your smoking and your blood sugar.   1. Increase your tresiba to 17 units once a day. We want to see your blood sugar in the low 100s first thing in the morning.   2. Start taking hydrochlorothiazide (HCTZ) 12.5 mg once a day. This should help your swelling go down. Call Andrea Glenn if you are having any problems.   3. Work towards decreasing your smoking every day! Keep up the good work! Your goal is to be smoking 5 cigarettes or LESS a day by the time you come back.   Come back to see Andrea Glenn in ~ 2 weeks. Your new pen should last you about  17 days.

## 2017-01-03 NOTE — Progress Notes (Signed)
Patient ID: Andrea Glenn, female   DOB: [birth year], 56 y.o.   MRN: [id number] Reviewed: Agree with Dr. Graylin Shiver documentation and management.

## 2017-01-11 ENCOUNTER — Telehealth: Payer: Self-pay | Admitting: Family Medicine

## 2017-01-11 NOTE — Telephone Encounter (Signed)
Patient came by office stated [facility] told her it will take 2 weeks to get her insulin. Patient does not have enough to last. Patient ask if we have any samples. Andrea Glenn) please call patient to let her know. [phone]

## 2017-01-20 ENCOUNTER — Ambulatory Visit (INDEPENDENT_AMBULATORY_CARE_PROVIDER_SITE_OTHER): Payer: Self-pay | Admitting: Pharmacist

## 2017-01-20 ENCOUNTER — Encounter: Payer: Self-pay | Admitting: Pharmacist

## 2017-01-20 VITALS — Ht 63.0 in | Wt 133.0 lb

## 2017-01-20 DIAGNOSIS — R300 Dysuria: Secondary | ICD-10-CM

## 2017-01-20 DIAGNOSIS — R319 Hematuria, unspecified: Secondary | ICD-10-CM

## 2017-01-20 DIAGNOSIS — IMO0002 Reserved for concepts with insufficient information to code with codable children: Secondary | ICD-10-CM

## 2017-01-20 DIAGNOSIS — F17200 Nicotine dependence, unspecified, uncomplicated: Secondary | ICD-10-CM

## 2017-01-20 DIAGNOSIS — E1165 Type 2 diabetes mellitus with hyperglycemia: Secondary | ICD-10-CM

## 2017-01-20 DIAGNOSIS — E1142 Type 2 diabetes mellitus with diabetic polyneuropathy: Secondary | ICD-10-CM

## 2017-01-20 HISTORY — DX: Hematuria, unspecified: R31.9

## 2017-01-20 LAB — POCT URINALYSIS DIP (MANUAL ENTRY)
BILIRUBIN UA: NEGATIVE
Glucose, UA: NEGATIVE mg/dL
LEUKOCYTES UA: NEGATIVE
NITRITE UA: NEGATIVE
PH UA: 6 (ref 5.0–8.0)
Protein Ur, POC: >=300 mg/dL — AB
Spec Grav, UA: >=1.03 — AB (ref 1.010–1.025)
Urobilinogen, UA: 1 E.U./dL

## 2017-01-20 LAB — POCT UA - MICROSCOPIC ONLY

## 2017-01-20 MED ORDER — CEPHALEXIN 500 MG PO CAPS: 500.0000 mg | ORAL_CAPSULE | Freq: Two times a day (BID) | ORAL | 0 refills | Status: DC

## 2017-01-20 NOTE — Progress Notes (Signed)
I was asked as preceptor to see patient for complaint of dysuria.  Reports pain with urination for the last week since switching to lantus. No vaginal discharge. No fevers, abdominal pain, back pain. History of UTIs in the past. No recent yeast infections. Denies urinary frequency. Had some hesitancy just now in the clinic while trying to give urine sample. Thinks urine smells worse than normal. Denies recent sexual activity or possibility of STD exposure.  On exam: Patient well appearing, NAD HEENT: moist mucous membranes Abdomen: soft, nontender to palpation, no masses Back: no CVA tenderness  Patient declined pelvic exam today so unable to assess for clinical signs of yeast.  UA obtained and shows trace ketones, high spec grav, mod blood, and >300 of protein, notably neg nitrites & leuks. Micro with RBCs, and a fair # of squams. Looking back, she had a positive urine culture in June with e coli but was not treated as she was asymptomatic. UA indices looked similar then.  Unclear if this represents a true UTI. Given her symptoms and prior positive culture with no treatment, I lean towards treating her for a UTI to see if symptoms & UA abnormalities resolve.  Plan: - will treat empirically with keflex for possible UTI - await urine culture - discussed with patient via phone after visit this plan, including need to repeat UA at future visit to ensure resolution of proteinuria & hematuria.  Patient appreciative & agreeable.  Chrisandra Netters, MD [place]

## 2017-01-20 NOTE — Progress Notes (Signed)
S:     Chief Complaint  Patient presents with  . Medication Management    Diabetes    Patient arrives in good spirits, ambulating slowly but with some difficulty 2/2 LEE. Presents for diabetes evaluation, education, and management in follow up of recent pharmacist clinic visit. Patient was referred on 11/17/2016.  Patient was last seen by Primary Care Provider on 11/17/2016. Last seen in Rx clinic on 12/30/2016. At that time,  Patient had some confusion with [facility] and had not gotten Trulicity. Tyler Aas was increased to 17 units. Tyler Aas was then converted 1:1 to Lantus. Medication Samples were provided to the patient at that time (late entry) Drug name: Lantus       Strength: 100U/mL        Qty: 1 pen  LOT: [id number]  Exp.Date: 06/26/2018 Dosing instructions: 17 units daily  Since last visit, patient states that CBGs have been better. Went to [facility] and got paperwork done but was told it will be 6-8 weeks before drug arrives. States that her dad is "at that age" and is causing stress. She has increased cigarette use in the last day but is generally smoking less than 5 cigarettes/day. Swelling in feet has improved. Has cut back on bread, french fries or potatoes.   Patient reports Diabetes was diagnosed in 2008.   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   Patient reports adherence with medications.  Current diabetes medications include: Lantus 17 units daily   Patient reports hypoglycemic events: 2 times in the last two weeks. Corrects with candies and sprite.  Patient reported dietary habits: Eats 3 meals/day Breakfast: [place] bacon, unsweet applesauce Lunch: PB sandwich, salad Dinner: cauliflower mashed potatoes, "something green" like salads or broccoli, sometimes has meat Snacks: ritz crackers, veggie sticks Drinks: water, unsweet tea, sprite zero.   Patient reported exercise habits: physically strenuous job, no other exercise.    Patient denies  nocturia. Improved from previous Patient denies neuropathy. Patient denies visual changes. Patient reports self foot exams.   O:  Physical Exam  Constitutional: She appears well-developed and well-nourished.  Musculoskeletal: She exhibits edema (bilaterally, feet).  Vitals reviewed.    Review of Systems  Constitutional: Negative for fever.  Gastrointestinal: Negative for abdominal pain.  Genitourinary: Positive for dysuria. Negative for flank pain, frequency, hematuria and urgency.  All other systems reviewed and are negative.    Lab Results  Component Value Date   HGBA1C >15.5 (H) 10/08/2016   There were no vitals filed for this visit.  Home fasting CBG: 89 this morning, 130 or less  10 year ASCVD risk: ~12%.  A/P: #Diabetes longstanding currently well controlled per patient-reported CBGs. Patient reports infrequent hypoglycemic events without significant pattern and is able to verbalize appropriate hypoglycemia management plan. Patient reports adherence with medication. She currently has ~15 days supply of Lantus.  -Continue Lantus 17 units daily -Follow up with [facility] when Trulicity is reading -Continue taking CBGs daily and call with lows and concerns. -Next A1C anticipated at next visit.    -ASCVD risk greater than 7.5%. Consider initiation of aspirin and high intensity statin at next visit.    #Smoking cessation Patient still smoking however now down to <5 cigarettes/day and has achieved her short term goal. Endorses significant stress.  -Congratulated her on her progress -Encouraged her to continue smoking < 5/day while dealing with family stressors  #Dysuria - evaluated and managed by Dr. Ardelia Mems  - Initiated tx cephalexin 500mg  BID  for 7 days.   Written patient instructions provided.  Total time in face to face counseling 30 minutes.   Follow up in Pharmacist Clinic Visit 2 weeks.   Patient seen with Ulanda Edison, PharmD PGY-1 Resident, and Deirdre Pippins,  PharmD, PGY2 Resident.

## 2017-01-20 NOTE — Assessment & Plan Note (Signed)
#  Diabetes longstanding currently well controlled per patient-reported CBGs. Patient reports infrequent hypoglycemic events without significant pattern and is able to verbalize appropriate hypoglycemia management plan. Patient reports adherence with medication. She currently has ~15 days supply of Lantus.  -Continue Lantus 17 units daily -Follow up with [facility] when Trulicity is reading -Continue taking CBGs daily and call with lows and concerns. -Next A1C anticipated at next visit.

## 2017-01-20 NOTE — Patient Instructions (Signed)
Thank you for coming to see [place] today! YOU ARE DOING SO WELL!!!!!!  1. Keep taking lantus 17 units once a day  2. Keep checking CBGs once a day.   Come back and see [place] in ~2 weeks.

## 2017-01-20 NOTE — Assessment & Plan Note (Signed)
#  Dysuria - evaluated and managed by Dr. Ardelia Mems  - Initiated tx cephalexin 500mg  BID for 7 days.

## 2017-01-20 NOTE — Assessment & Plan Note (Signed)
#  Smoking cessation Patient still smoking however now down to <5 cigarettes/day and has achieved her short term goal. Endorses significant stress.  -Congratulated her on her progress -Encouraged her to continue smoking < 5/day while dealing with family stressors

## 2017-01-22 LAB — URINE CULTURE

## 2017-01-24 ENCOUNTER — Telehealth: Payer: Self-pay | Admitting: *Deleted

## 2017-01-24 ENCOUNTER — Other Ambulatory Visit: Payer: Self-pay | Admitting: Family Medicine

## 2017-01-24 DIAGNOSIS — IMO0002 Reserved for concepts with insufficient information to code with codable children: Secondary | ICD-10-CM

## 2017-01-24 DIAGNOSIS — E1165 Type 2 diabetes mellitus with hyperglycemia: Principal | ICD-10-CM

## 2017-01-24 DIAGNOSIS — E1142 Type 2 diabetes mellitus with diabetic polyneuropathy: Secondary | ICD-10-CM

## 2017-01-24 MED ORDER — INSULIN DEGLUDEC 100 UNIT/ML ~~LOC~~ SOPN: 17.0000 [IU] | PEN_INJECTOR | Freq: Every day | SUBCUTANEOUS | 6 refills | Status: DC

## 2017-01-24 NOTE — Progress Notes (Signed)
Patient ID: Andrea Glenn, female   DOB: [birth year], 56 y.o.   MRN: [id number] Reviewed: Agree with Dr. Graylin Shiver documentation and management.

## 2017-01-24 NOTE — Telephone Encounter (Signed)
Andrea Glenn from [facility] calls.  Pt told them she is taking 17units of tresiba daily.  The last script they have says 14 units.    They need a new script with the new dosage.  Will forward to MD as medication is not on her current list Andrea Glenn, [name], CMA

## 2017-02-03 ENCOUNTER — Encounter: Payer: Self-pay | Admitting: Pharmacist

## 2017-02-03 ENCOUNTER — Ambulatory Visit (INDEPENDENT_AMBULATORY_CARE_PROVIDER_SITE_OTHER): Payer: Self-pay | Admitting: Pharmacist

## 2017-02-03 DIAGNOSIS — I10 Essential (primary) hypertension: Secondary | ICD-10-CM

## 2017-02-03 DIAGNOSIS — E1165 Type 2 diabetes mellitus with hyperglycemia: Secondary | ICD-10-CM

## 2017-02-03 DIAGNOSIS — E1142 Type 2 diabetes mellitus with diabetic polyneuropathy: Secondary | ICD-10-CM

## 2017-02-03 DIAGNOSIS — IMO0002 Reserved for concepts with insufficient information to code with codable children: Secondary | ICD-10-CM

## 2017-02-03 DIAGNOSIS — F17200 Nicotine dependence, unspecified, uncomplicated: Secondary | ICD-10-CM

## 2017-02-03 HISTORY — DX: Essential (primary) hypertension: I10

## 2017-02-03 MED ORDER — INSULIN GLARGINE 100 UNITS/ML SOLOSTAR PEN: 14.0000 [IU] | PEN_INJECTOR | Freq: Every day | SUBCUTANEOUS | 0 refills | Status: DC

## 2017-02-03 NOTE — Assessment & Plan Note (Signed)
Tobacco Abuse - longstanding currently improved, smoking ~3 cigarettes/day. Patient concerned about gaining weight if she quits smoking. Counseled patient on ways to quit smoking while also not gaining weight. Reassess patient's willingness to quit at next visit; endorsed potentially quitting at the end of November.

## 2017-02-03 NOTE — Progress Notes (Signed)
S:    Chief Complaint  Patient presents with  . Medication Management    Diabetes   Patient arrives ambulating slowly without assistance 2/2 lower-extremity edema.  Presents for diabetes evaluation, education, and management in follow-up of recent Rx clinic visit. Patient was referred on 11/17/16.  Last seen in Rx clinic on 01/20/17. Patient was last seen by Primary Care Provider on 11/17/16. Patient is waiting for [facility] to get her Trulicity.  Patient reports Diabetes was diagnosed in 2008.   Social History: Patient reports smoking ~3 cigarettes/day; patient reports extra stress from her job to the point of almost quitting recently Patient is worried about gaining weight if she quits smoking. Patient reports smoking within the past hour of her visit. Her goal is to quit by the end of November and to try to decrease her smoking to 2-3 cigarettes/day in the short-term.  Patient reports adherence with medications.  Current diabetes medications include: Lantus 17 units/day. Patient is concerned about gaining weight with insulin, wants to stay within 130-135 lbs. Current hypertension medications include: HCTZ 25 mg - 1/2 tablet (12.5 mg total) daily  Patient reports subjective hypoglycemic events. She felt a little shaky yesterday and today, and decreased her Lantus from her usual dose of 17 units/day to 14 units/day.  Patient reported dietary habits: Eats 3 meals/day Snacks: Banana at least once a week to increase potassium, carrots Patient reports her diet has significantly improved since last visit.  Patient reported exercise habits: A lot of walking, physically strenuous job  Patient denies nocturia. Patient denies urinary complaints. Patient denies neuropathy. Patient reports visual changes. A little worse while driving; no blurry vision. Patient reports self foot exams. No issues to report.  O:  Physical Exam  Constitutional: She appears well-developed and well-nourished.    Musculoskeletal: She exhibits edema.  (bilaterally, legs/feet)   ROS  Lab Results  Component Value Date   HGBA1C >15.5 (H) 10/08/2016   Vitals:   02/03/17 1455  BP: (!) 158/70  Pulse: 64  Weight: 130 lb 9.6 oz (59.2 kg)  Height: 5\' 4"  (1.626 m)   Home fasting CBG: 70s-80s; have not been above 100 since her last visit   10 year ASCVD risk: 62.6%.  A/P: Diabetes longstanding currently controlled, as evidence by patient-reported CBGs. Patient reports subjective hypoglycemic events and is able to verbalize appropriate hypoglycemia management plan. Patient reports adherence with medication.  Decreased dose of basal insulin Lantus (insulin glargine) to 14 units/day. Provided samples of Lantus.Next A1C anticipated at this visit, however patient declined. Patient agrees to get it checked at the next visit.    ASCVD risk greater than 7.5%. Consider initiating aspirin 81 mg at next visit. Patient has an intolerance to atorvastatin (SI), consider alternative statin at next visit.  Elevated BP, on HCTZ for lower-extremity edema.  Patient reports adherence with medication. Control is suboptimal due to recent nicotine use. No changes to her HCTZ medication at this time.  Tobacco Abuse - longstanding currently improved, smoking ~3 cigarettes/day. Patient concerned about gaining weight if she quits smoking. Counseled patient on ways to quit smoking while also not gaining weight. Reassess patient's willingness to quit at next visit; endorsed potentially quitting at the end of November.  Written patient instructions provided.  Total time in face to face counseling 25 minutes.   Follow up in Pharmacist Clinic Visit in 6 weeks.   Patient seen with Drusilla Kanner, PharmD Candidate, Ulanda Edison, PharmD PGY-1 Resident, and Deirdre Pippins, PharmD, PGY2 Resident.

## 2017-02-03 NOTE — Patient Instructions (Addendum)
Thanks for coming in to see Andrea Glenn today!  We decreased your Lantus dose to 14 units/day and gave you some samples of Lantus for you to use until your Trulicity comes.  Work on quitting smoking. You've done great recently! Let's try to quit by the end of November.  Try to walk within 1-2 hours of your biggest meal of the day to help with your sugars.  Come back to see Andrea Glenn whenever your Trulicity comes in or in 6 weeks, whichever is sooner, at [facility].

## 2017-02-03 NOTE — Progress Notes (Signed)
Patient ID: Andrea Glenn, female   DOB: 05-28-1960, 56 y.o.   MRN: [id number] Reviewed: Agree with Dr. Graylin Shiver documentation and management.

## 2017-02-03 NOTE — Assessment & Plan Note (Signed)
Elevated BP,  on HCTZ for lower-extremity edema.  Patient reports adherence with medication. Control is suboptimal due to recent nicotine use. No changes to her HCTZ medication at this time.  Tobacco Abuse - longstanding currently improved, smoking ~3 cigarettes/day. Patient concerned about gaining weight if she quits smoking. Counseled patient on ways to quit smoking while also not gaining weight. Reassess patient's willingness to quit at next visit; endorsed potentially quitting at the end of November.

## 2017-02-03 NOTE — Assessment & Plan Note (Signed)
Diabetes longstanding currently controlled, as evidence by patient-reported CBGs. Patient reports subjective hypoglycemic events and is able to verbalize appropriate hypoglycemia management plan. Patient reports adherence with medication.  Decreased dose of basal insulin Lantus (insulin glargine) to 14 units/day. Provided samples of Lantus.Next A1C anticipated at this visit, however patient declined. Patient agrees to get it checked at the next visit.

## 2017-03-14 ENCOUNTER — Encounter: Payer: Self-pay | Admitting: Pharmacist

## 2017-03-14 ENCOUNTER — Telehealth: Payer: Self-pay | Admitting: Family Medicine

## 2017-03-14 NOTE — Telephone Encounter (Signed)
Patient came to office would like to know if she can get samples of insulin & the needles due to [facility] has stated to her it could be 6 - 8 weeks before they receive any. Patient is down to 3. Please let patient know as soon as possible. [phone]

## 2017-03-14 NOTE — Progress Notes (Signed)
Patient arrives at the office requesting samples of medication.  States no supply yet from [facility].  Notes she quit for 3 days prior to Thanksgiving and has only smoked 3 cigarettes during the holidays.  She notes her cigarette brand is no longer in production (sh cannot find it any more) and she is unwilling to switch brands so she plans to quit permanently.  Appears committed to long-term quit.   Provideded:  Medication Samples have been provided to the patient.  Drug name: Tyler Aas       Strength: 200units/ml        Qty: 1 pen 32ml.  LOT: [id number] Exp.Date: 02/17/2019  Dosing instructions: Same dose - 16units once daily  The patient has been instructed regarding the correct time, dose, and frequency of taking this medication, including desired effects and most common side effects.    We will continue to anticipate Trulicity and Tresiba from the [facility] at any time.  Her follow up is in Rx Clinic 12/17.   Janeann Forehand 4:20 PM 03/14/2017

## 2017-03-15 NOTE — Telephone Encounter (Signed)
Will forward to MD to advise. Yumi Insalaco,CMA  

## 2017-03-16 NOTE — Telephone Encounter (Signed)
Discussed with Dr. Valentina Lucks who provided patient with medication samples. Should be covered for now.  Harriet Butte, [place], PGY-2

## 2017-03-30 ENCOUNTER — Ambulatory Visit: Payer: Self-pay | Admitting: Family Medicine

## 2017-03-30 NOTE — Progress Notes (Deleted)
   Subjective   Patient ID: Andrea Glenn    DOB: Sep 10, 1960, 56 y.o. female   MRN: [id number]  CC: "***"  HPI: Andrea Glenn is a 56 y.o. female who presents to clinic today for the following:  ***: ***  ROS: see HPI for pertinent.  [place]: Poorly controlled T2DM, recent pancreatitis, tobacco use disorder, RLS, HLD. Surgical shoulder and foot surgery, c-sec, back surgery. Family mother CKD, DM, thyroid disease, HTN, HF, father CKD, sister cancer, brother stroke. Smoking status reviewed. Medications reviewed.  Objective   There were no vitals taken for this visit. Vitals and nursing note reviewed.  General: well nourished, well developed, NAD with non-toxic appearance HEENT: normocephalic, atraumatic, moist mucous membranes Neck: supple, non-tender without lymphadenopathy Cardiovascular: regular rate and rhythm without murmurs, rubs, or gallops Lungs: clear to auscultation bilaterally with normal work of breathing Abdomen: soft, non-tender, non-distended, normoactive bowel sounds Skin: warm, dry, no rashes or lesions, cap refill < 2 seconds Extremities: warm and well perfused, normal tone, no edema  Assessment & Plan   No problem-specific Assessment & Plan notes found for this encounter.  No orders of the defined types were placed in this encounter.  No orders of the defined types were placed in this encounter.   Harriet Butte, [place], PGY-2 03/30/2017, 12:22 PM

## 2017-04-04 ENCOUNTER — Encounter: Payer: Self-pay | Admitting: Internal Medicine

## 2017-04-04 ENCOUNTER — Other Ambulatory Visit: Payer: Self-pay

## 2017-04-04 ENCOUNTER — Ambulatory Visit (INDEPENDENT_AMBULATORY_CARE_PROVIDER_SITE_OTHER): Payer: Self-pay | Admitting: Internal Medicine

## 2017-04-04 VITALS — BP 140/78 | HR 84 | Temp 98.6°F | Ht 64.0 in | Wt 128.0 lb

## 2017-04-04 DIAGNOSIS — E1165 Type 2 diabetes mellitus with hyperglycemia: Secondary | ICD-10-CM

## 2017-04-04 DIAGNOSIS — E1142 Type 2 diabetes mellitus with diabetic polyneuropathy: Secondary | ICD-10-CM

## 2017-04-04 DIAGNOSIS — IMO0002 Reserved for concepts with insufficient information to code with codable children: Secondary | ICD-10-CM

## 2017-04-04 LAB — POCT GLYCOSYLATED HEMOGLOBIN (HGB A1C): Hemoglobin A1C: 6.7

## 2017-04-04 NOTE — Progress Notes (Signed)
   Subjective:   Patient: Andrea Glenn       Birthdate: 1961-04-12       MRN: [id number]      HPI  Andrea Glenn is a 56 y.o. female presenting for f/u of Type II DM.   Type 2 DM Currently prescribed Tresiba 17U daily, however has been taking 18U as she says the pen only registers even numbers. Also taking nortriptyline 12.5mg  (one half tab) for neuropathy. Most recent A1C >15.5 on 10/08/16.  Today, patient reports that she has been taking [place] as prescribed. She says she was also prescribed Trulicity, however has not been taking this as she was not able to pick it up from the pharmacy until today. Fasting blood sugars in low 100s. Occasionally has lower readings (lowest 46 or 56), but she can always feel when her blood sugar is low and immediately eats food. Says she feels lightheaded when her blood sugar is low. Post-prandial blood sugars usually in 110s. Reports continued pain in her feet despite taking Pamelor. Is prescribed 25-50mg  daily, however is only taking 12.5mg . Endorses some swelling in her feet, denies numbness. Last ophtho exam 08/2015.   Smoking status reviewed. Patient is current every day smoker.   Review of Systems See HPI.     Objective:  Physical Exam  Constitutional: She is oriented to person, place, and time and well-developed, well-nourished, and in no distress.  HENT:  Head: Normocephalic and atraumatic.  Eyes: Conjunctivae and EOM are normal. Pupils are equal, round, and reactive to light. Right eye exhibits no discharge. Left eye exhibits no discharge.  Pulmonary/Chest: Effort normal. No respiratory distress.  Neurological: She is alert and oriented to person, place, and time.  Skin: Skin is warm and dry.  Psychiatric: Affect and judgment normal.   Diabetic Foot Exam - Simple   Simple Foot Form Diabetic Foot exam was performed with the following findings:  Yes 04/04/2017  5:05 PM  Visual Inspection No deformities, no ulcerations, no other skin  breakdown bilaterally:  Yes Sensation Testing Intact to touch and monofilament testing bilaterally:  Yes Pulse Check Posterior Tibialis and Dorsalis pulse intact bilaterally:  Yes Comments       Assessment & Plan:  Uncontrolled type 2 diabetes mellitus with peripheral neuropathy (HCC) A1C significantly improved from >15.5 in 05/2016 to 6.7 today. Currently taking Tresiba 18U daily and nortriptyline 12.5mg  for neuropathy.  Patient's A1C has improved on Tresiba alone, so will continue monotherapy with this. Discussed that she should not begin Trulicity, and should continue on the current dose of 18U that she is taking. Encouraged to continue measuring blood sugar as she has been.  Regarding neuropathy, patient still with symptoms, however is taking very low dose of nortriptyline, lower than prescribed. Encouraged her to increase to one full tablet (25mg ) for 1 week, the increase to two full tablets (50mg  total) after that if pain persists.  F/u in 3 months for repeat A1C. Also encouraged to schedule ophtho appt at her earliest convenience.    Adin Hector, MD, MPH PGY-3 [facility] Pager [phone]

## 2017-04-04 NOTE — Assessment & Plan Note (Signed)
A1C significantly improved from >15.5 in 05/2016 to 6.7 today. Currently taking Tresiba 18U daily and nortriptyline 12.5mg  for neuropathy.  Patient's A1C has improved on Tresiba alone, so will continue monotherapy with this. Discussed that she should not begin Trulicity, and should continue on the current dose of 18U that she is taking. Encouraged to continue measuring blood sugar as she has been.  Regarding neuropathy, patient still with symptoms, however is taking very low dose of nortriptyline, lower than prescribed. Encouraged her to increase to one full tablet (25mg ) for 1 week, the increase to two full tablets (50mg  total) after that if pain persists.  F/u in 3 months for repeat A1C. Also encouraged to schedule ophtho appt at her earliest convenience.

## 2017-04-04 NOTE — Patient Instructions (Addendum)
It was nice meeting you today [name]!  Congratulations on improving your blood sugar so much!   Please have your pharmacy contact us with the refill request for [place].   Continue to take [place]. DO NOT begin taking Trulicity. The only diabetes medicines you should be taking is [place] Pharmacologist (nortriptyline).   Increase Pamelor (nortriptyline) to 1 tablet (25mg ) for one week. If you are still having pain in your feet, you can increase this to 2 tablets (50 mg total) nightly.   Please schedule an appointment with your eye doctor as soon as you can.   We will see you back in three months to check your A1C again.   If you have any questions or concerns, please feel free to call the clinic.   Be well,  Dr. Avon Gully

## 2017-04-25 ENCOUNTER — Telehealth: Payer: Self-pay | Admitting: Family Medicine

## 2017-04-25 NOTE — Telephone Encounter (Signed)
Patient came to office stated she only has 3 days left of [place]. Patient does not want to run out. Patient stated she needs the needles also. Please let patient know if she may get some samples here. [phone]

## 2017-04-27 NOTE — Telephone Encounter (Signed)
Dr. Valentina Lucks, what is the procedure for patients receiving samples of meds like insulin which need to be refrigerated? I know we have [place] in the storage. Not sure about the needles. Thanks.  Harriet Butte, [place], PGY-2

## 2017-04-29 NOTE — Telephone Encounter (Signed)
LMOVM for pt to call us back. See message below. Deseree Blount, CMA  

## 2017-04-29 NOTE — Telephone Encounter (Signed)
Attempted to call pt again, no answer. Andrea Glenn, CMA

## 2017-05-19 ENCOUNTER — Ambulatory Visit (INDEPENDENT_AMBULATORY_CARE_PROVIDER_SITE_OTHER): Payer: Self-pay | Admitting: Pharmacist

## 2017-05-19 ENCOUNTER — Encounter: Payer: Self-pay | Admitting: Pharmacist

## 2017-05-19 DIAGNOSIS — E1165 Type 2 diabetes mellitus with hyperglycemia: Secondary | ICD-10-CM

## 2017-05-19 DIAGNOSIS — E1142 Type 2 diabetes mellitus with diabetic polyneuropathy: Secondary | ICD-10-CM

## 2017-05-19 DIAGNOSIS — IMO0002 Reserved for concepts with insufficient information to code with codable children: Secondary | ICD-10-CM

## 2017-05-19 DIAGNOSIS — F17200 Nicotine dependence, unspecified, uncomplicated: Secondary | ICD-10-CM

## 2017-05-19 MED ORDER — DULAGLUTIDE 0.75 MG/0.5ML ~~LOC~~ SOAJ: 0.7500 mg | SUBCUTANEOUS | Status: DC

## 2017-05-19 MED ORDER — NICOTINE 7 MG/24HR TD PT24: 7.0000 mg | MEDICATED_PATCH | Freq: Every day | TRANSDERMAL | 2 refills | Status: DC

## 2017-05-19 NOTE — Assessment & Plan Note (Signed)
Diabetes longstanding and currently  Much improved based on A1c.However, weight gain of > 10- lbs is concerning. . Patient reports hypoglycemic events and is able to verbalize appropriate hypoglycemia management plan. Patient reports adherence with medication.  Decreased dose of basal insulin Tresiba (insulin deglutec) from 14 units to 10 units.  Started Trulicity (dulaglutide) at 0.75mg  once weekly.  Patient educated on purpose, proper use and potential adverse effects including pancreatitis and nausea. Following instruction patient verbalized understanding of treatment plan.

## 2017-05-19 NOTE — Progress Notes (Signed)
    S:     Chief Complaint  Patient presents with  . Medication Management    Diabetes, tobacco cessation    Patient arrives in good spirits, ambulating without assistance.  Presents for diabetes evaluation, education, and management at the request of Dr. Yisroel Ramming for follow-up on DM.  Patient was last seen by Primary Care Provider on 11/12/2017.   Patient reports Diabetes was diagnosed in 2012.  Patient reports adherence with medications including Tresiba 14 units once daily in the PM.  Current hypertension medications include: HCTZ - not taking.   Patient reports one single hypoglycemic events at CBG of 69.   Reports most CBGs 100-200.   Patient reported dietary habits: Eats 2 meals/day  Patient reported exercise habits: Limited due to cold winter weather.    Patient denies nocturia.  Patient reports neuropathy including numb and cold feet.  Patient denies visual changes. Patient reports self foot exams.     O:  Physical Exam  Constitutional: She appears well-developed and well-nourished.  Musculoskeletal: She exhibits edema.  bilateral lower extremity swelling remains at 2+   Review of Systems  Neurological: Positive for sensory change.  All other systems reviewed and are negative. Cold feet consistently - numbness routinely.    Lab Results  Component Value Date   HGBA1C 6.7 04/04/2017     A/P: Diabetes longstanding and currently  Much improved based on A1c.However, weight gain of > 10- lbs is concerning. . Patient reports hypoglycemic events and is able to verbalize appropriate hypoglycemia management plan. Patient reports adherence with medication.  Decreased dose of basal insulin Tresiba (insulin deglutec) from 14 units to 10 units.  Started Trulicity (dulaglutide) at 0.75mg  once weekly.  Patient educated on purpose, proper use and potential adverse effects including pancreatitis and nausea. Following instruction patient verbalized understanding of treatment  plan.   Longstanding tobacco abuse since age 34.  Currently prepared to take action by setting a quit date for Monday Feb 4th.   Nicotine patches were used in the hospital in the past with success.  7 mg nicotine patch supply provided - plus 2 refills.  Written patient instructions provided.  Total time in face to face counseling 35 minutes. Follow up in Pharmacist Clinic Visit 2-3 weeks.   Patient seen with Onnie Boer, PharmD Candidate.

## 2017-05-19 NOTE — Patient Instructions (Addendum)
Decrease Tresiba from 14 to 10 units once daily.    Start your Trulicity first shot on Sunday Feb 10th.    Start your nicotine patch on Monday February 4th - 7mg  patches once daily.  Quit smoking starting on February 4th!  Follow up with Rx clinic in 2-3 weeks.

## 2017-05-19 NOTE — Assessment & Plan Note (Signed)
Longstanding tobacco abuse since age 57.  Currently prepared to take action by setting a quit date for Monday Feb 4th.   Nicotine patches were used in the hospital in the past with success.  7 mg nicotine patch supply provided - plus 2 refills.  Written patient instructions provided.

## 2017-06-09 ENCOUNTER — Ambulatory Visit (INDEPENDENT_AMBULATORY_CARE_PROVIDER_SITE_OTHER): Payer: Self-pay | Admitting: Pharmacist

## 2017-06-09 DIAGNOSIS — E1142 Type 2 diabetes mellitus with diabetic polyneuropathy: Secondary | ICD-10-CM

## 2017-06-09 DIAGNOSIS — I10 Essential (primary) hypertension: Secondary | ICD-10-CM

## 2017-06-09 DIAGNOSIS — E1165 Type 2 diabetes mellitus with hyperglycemia: Secondary | ICD-10-CM

## 2017-06-09 DIAGNOSIS — IMO0002 Reserved for concepts with insufficient information to code with codable children: Secondary | ICD-10-CM

## 2017-06-09 DIAGNOSIS — F17200 Nicotine dependence, unspecified, uncomplicated: Secondary | ICD-10-CM

## 2017-06-09 DIAGNOSIS — R600 Localized edema: Secondary | ICD-10-CM

## 2017-06-09 MED ORDER — FUROSEMIDE 20 MG PO TABS: 20.0000 mg | ORAL_TABLET | Freq: Every day | ORAL | 0 refills | Status: DC

## 2017-06-09 NOTE — Assessment & Plan Note (Signed)
Diabetes longstanding currently controlled. Patient denies hypoglycemic events and is able to verbalize appropriate hypoglycemia management plan. Patient reports adherence with medication. Decreased Tresiba to 8 units once daily.  Next A1C anticipated March 2019.

## 2017-06-09 NOTE — Assessment & Plan Note (Signed)
Bilateral Lower extremity edema: CMP ordered and started lasix 20 mg once daily. Patient counseled on drug timing, and side effects.  Consider SGLT2 therapy at the next visit.

## 2017-06-09 NOTE — Assessment & Plan Note (Signed)
Continued tobacco use - reduced to 4 cigarettes per day. Patient encouraged to continue reducing cigarette use and consider full tobacco cessation by birthday (07/08/17).

## 2017-06-09 NOTE — Patient Instructions (Addendum)
It was great seeing you today.  TAKE Lasix 20 mg (furosemide) once a day starting Friday 2/22 after you get home from work. Continue taking 20 mg once a day each morning. Try not to take lasix more than 6 hours before bed.  Decrease Tresiba to 8 units once a day.  Keep up the good progress with smoking less and quitting cigarettes.  Follow up with Dr. Yisroel Ramming in 1-2 weeks.

## 2017-06-09 NOTE — Assessment & Plan Note (Signed)
Hypertension longstanding currently uncontrolled likely due to volume overload.  Patient denies adherence with medication. Control is suboptimal due to patient non adherence to therapy as a result of perceived medication side effects.

## 2017-06-09 NOTE — Progress Notes (Addendum)
    S:     Chief Complaint  Patient presents with  . Medication Management    T2DM    Patient arrives ambulating slowly with pain and in poor spirits. Longstanding patient of pharmacy clinic presents for diabetes evaluation, education, and management. Patient was last seen by Primary Care Provider on 11/12/17. Patient has significant past medical history for >100lb weight loss.   Patient complains of significant pain and swelling in lower extremities. Endorses sleeping with legs elevated above level of heart to reduce swelling. Reports swelling is lower after rising from bed and accumulates quickly after awakening. Patient complains of pain, severe difficulty ambulating and performing tasks required of work.  Patient reports smoking 4 cigarettes today. States she has reduced usage over the last couple of weeks. Denies use of nicotine patch secondary to cost and lack of coverage thorough Medicaid.  Family/Social History: patient works early morning hours at [employer]  Patient denies adherence with medications.  Current diabetes medications include: Tresiba 14 units daily, dulaglutide 0.75mg weekly Current hypertension medications include: none currently (patient reports discontinuing HCTZ due to complaints of somnolence)  Patient denies hypoglycemic events. Patient reports neuropathy in feet.  Patient reported exercise habits: none reported  O:  Physical Exam  Constitutional: She appears well-developed and well-nourished.  Musculoskeletal: She exhibits edema.    Physical exam significant for lower extremity edema and fluid retention.  Review of Systems  Cardiovascular: Positive for leg swelling.  Musculoskeletal: Positive for myalgias.  Neurological: Positive for tingling.    Evaluation with Dr. Hensel Lungs -CTA   Lab Results  Component Value Date   HGBA1C 6.7 04/04/2017   Vitals:   06/09/17 1553  BP: (!) 162/80  Pulse: 87  SpO2: 96%    Home fasting CBG:  mostly 100s   A/P: Diabetes longstanding currently controlled. Patient denies hypoglycemic events and is able to verbalize appropriate hypoglycemia management plan. Patient reports adherence with medication. Decreased Tresiba to 8 units once daily.  Next A1C anticipated March 2019.    Continued tobacco use - reduced to 4 cigarettes per day. Patient encouraged to continue reducing cigarette use and consider full tobacco cessation by birthday (07/08/17).    Bilateral Lower extremity edema: CMP ordered and started lasix 20 mg once daily. Patient counseled on drug timing, and side effects.  Consider SGLT2 therapy at the next visit.    Hypertension longstanding currently uncontrolled likely due to volume overload.  Patient denies adherence with medication. Control is suboptimal due to patient non adherence to therapy as a result of perceived medication side effects.    Written patient instructions provided.  Total time in face to face counseling 30 minutes.   Follow up with PCP in 1 week. Patient seen with Gloria Adedoyin, PharmD Candidate and Sabrina Dunham, PharmD.    Labs reviewed  Scr increased from 0.7 range to 1.1 Alk Phos elevated 199 previously normal Follow at next visit with Dr. McMullen on March 5th   

## 2017-06-10 LAB — COMPREHENSIVE METABOLIC PANEL
A/G RATIO: 0.9 — AB (ref 1.2–2.2)
ALT: 22 IU/L (ref 0–32)
AST: 36 IU/L (ref 0–40)
Albumin: 3.1 g/dL — ABNORMAL LOW (ref 3.5–5.5)
Alkaline Phosphatase: 199 IU/L — ABNORMAL HIGH (ref 39–117)
BUN/Creatinine Ratio: 22 (ref 9–23)
BUN: 24 mg/dL (ref 6–24)
Bilirubin Total: 0.4 mg/dL (ref 0.0–1.2)
CO2: 23 mmol/L (ref 20–29)
Calcium: 8.7 mg/dL (ref 8.7–10.2)
Chloride: 108 mmol/L — ABNORMAL HIGH (ref 96–106)
Creatinine, Ser: 1.11 mg/dL — ABNORMAL HIGH (ref 0.57–1.00)
GFR calc Af Amer: 64 mL/min/{1.73_m2} (ref >59)
GFR calc non Af Amer: 55 mL/min/{1.73_m2} — ABNORMAL LOW (ref >59)
GLUCOSE: 159 mg/dL — AB (ref 65–99)
Globulin, Total: 3.3 g/dL (ref 1.5–4.5)
POTASSIUM: 4.7 mmol/L (ref 3.5–5.2)
Sodium: 144 mmol/L (ref 134–144)
Total Protein: 6.4 g/dL (ref 6.0–8.5)

## 2017-06-14 ENCOUNTER — Ambulatory Visit (INDEPENDENT_AMBULATORY_CARE_PROVIDER_SITE_OTHER): Payer: Self-pay | Admitting: Family Medicine

## 2017-06-14 ENCOUNTER — Encounter: Payer: Self-pay | Admitting: Family Medicine

## 2017-06-14 ENCOUNTER — Encounter: Payer: Self-pay | Admitting: *Deleted

## 2017-06-14 ENCOUNTER — Other Ambulatory Visit: Payer: Self-pay

## 2017-06-14 VITALS — BP 170/80 | HR 83 | Temp 98.7°F | Ht 64.0 in | Wt 146.0 lb

## 2017-06-14 DIAGNOSIS — R600 Localized edema: Secondary | ICD-10-CM

## 2017-06-14 NOTE — Patient Instructions (Signed)
Thank you for coming in to see [place] today. Please see below to review our plan for today's visit.  Continue the Lasix daily.  I will call you with the lab results.  We will work on scheduling for the echocardiogram tomorrow.  Follow-up in the clinic in 2 days.  If you develop any shortness of breath or chest pain, call 911 immediately.  Please call the clinic at [phone] if your symptoms worsen or you have any concerns. It was our pleasure to serve you.  Harriet Butte, [place], PGY-2

## 2017-06-14 NOTE — Assessment & Plan Note (Addendum)
Subacute.  Uncertain etiology.  Concerning for new onset heart failure though no known ACS no prior echocardiogram per chart review.  Patient without bibasilar rales but there is signs of JVD.  Recent CT of abdomen last year did not show signs of liver disease though alk phos was incidentally elevated during CMET 5 days ago.  Patient denies history of IV drug use. - Urgent complete 2D echocardiogram ordered - Will obtain GTT to differentiate alk phosphatase - Repeat CMET to reevaluate AK I, patient instructed to continue Lasix and will discontinue if AK I persists - RTC 2 days - Reviewed return precautions

## 2017-06-14 NOTE — Progress Notes (Signed)
   Subjective   Patient ID: Andrea Glenn    DOB: 1960/06/16, 57 y.o. female   MRN: [id number]  CC: "Leg swelling"  HPI: Andrea Glenn is a 57 y.o. female who presents for a same day appointment for the following:  LEG SWELLING  Having leg swelling for 1-2 weeks. Location: lower legs bilaterally Timing of swelling: worse at end of day New medications: Lasix 20 mg daily History of Kidney problems: No but AKI based on CMET 5 days ago History of Heart problems: No History of Liver problems: No History of cancer: No  Symptoms Chest pain: No Shortness of breath: No Immobility: No Black or bloody bowel movements: No Severe snoring or day time sleepiness: Yes Abdomen swelling: No History of blood clots: No Weight Loss: No  Of note, patient does have dry cough at night.  Weight is down from 151 pounds to 146 pounds today.  Patient 142 pounds earlier this year.  However, patient was 128 pounds as of 03/2017.   ROS: see HPI for pertinent.  [place]: Poorly controlled T2DM, recent pancreatitis, tobacco use disorder, RLS, HLD. Surgical shoulder and foot surgery, c-sec, back surgery. Family history CKD, DM, thyroid disease, HTN, HF, stroke, cancer (sister).  Smoking status reviewed. Medications reviewed.   Objective   BP (!) 170/80   Pulse 83   Temp 98.7 F (37.1 C) (Oral)   Ht _0  (1.626 m)   Wt 146 lb (66.2 kg)   SpO2 93%   BMI 25.06 kg/m  Vitals and nursing note reviewed.  General: well nourished, well developed, NAD with non-toxic appearance HEENT: normocephalic, atraumatic, moist mucous membranes Neck: supple, non-tender without lymphadenopathy Cardiovascular: regular rate and rhythm with systolic murmur, without rubs, or gallops Lungs: clear to auscultation bilaterally with normal work of breathing Abdomen: soft, non-tender, non-distended, normoactive bowel sounds Skin: warm, dry, no rashes or lesions, cap refill < 2 seconds Extremities: warm and well perfused,  normal tone, no edema  Assessment & Plan   Lower extremity edema Subacute.  Uncertain etiology.  Concerning for new onset heart failure though no known ACS no prior echocardiogram per chart review.  Patient without bibasilar rales but there is signs of JVD.  Recent CT of abdomen last year did not show signs of liver disease though alk phos was incidentally elevated during CMET 5 days ago.  Patient denies history of IV drug use. - Urgent complete 2D echocardiogram ordered - Will obtain GTT to differentiate alk phosphatase - Repeat CMET to reevaluate AK I, patient instructed to continue Lasix and will discontinue if AK I persists - RTC 2 days - Reviewed return precautions  Orders Placed This Encounter  Procedures  . CMP14+EGFR  . Gamma GT  . ECHOCARDIOGRAM COMPLETE    Standing Status:   Future    Standing Expiration Date:   09/12/2018    Order Specific Question:   Where should this test be performed    Answer:   [facility]    Order Specific Question:   Perflutren DEFINITY (image enhancing agent) should be administered unless hypersensitivity or allergy exist    Answer:   Administer Perflutren    Order Specific Question:   Expected Date:    Answer:   ASAP   No orders of the defined types were placed in this encounter.   Harriet Butte, [place], PGY-2 06/14/2017, 5:23 PM

## 2017-06-15 ENCOUNTER — Ambulatory Visit (HOSPITAL_COMMUNITY)
Admission: RE | Admit: 2017-06-15 | Discharge: 2017-06-15 | Disposition: A | Discharge status: Home / Self Care | Payer: Self-pay | Source: Ambulatory Visit | Attending: Radiology | Admitting: Radiology

## 2017-06-15 DIAGNOSIS — E119 Type 2 diabetes mellitus without complications: Secondary | ICD-10-CM | POA: Insufficient documentation

## 2017-06-15 DIAGNOSIS — Z720 Tobacco use: Secondary | ICD-10-CM | POA: Insufficient documentation

## 2017-06-15 DIAGNOSIS — R600 Localized edema: Secondary | ICD-10-CM | POA: Insufficient documentation

## 2017-06-15 DIAGNOSIS — I272 Pulmonary hypertension, unspecified: Secondary | ICD-10-CM | POA: Insufficient documentation

## 2017-06-15 DIAGNOSIS — E785 Hyperlipidemia, unspecified: Secondary | ICD-10-CM | POA: Insufficient documentation

## 2017-06-15 DIAGNOSIS — I10 Essential (primary) hypertension: Secondary | ICD-10-CM | POA: Insufficient documentation

## 2017-06-15 LAB — CMP14+EGFR
ALT: 15 IU/L (ref 0–32)
AST: 33 IU/L (ref 0–40)
Albumin/Globulin Ratio: 0.8 — ABNORMAL LOW (ref 1.2–2.2)
Albumin: 2.8 g/dL — ABNORMAL LOW (ref 3.5–5.5)
Alkaline Phosphatase: 174 IU/L — ABNORMAL HIGH (ref 39–117)
BUN/Creatinine Ratio: 19 (ref 9–23)
BUN: 23 mg/dL (ref 6–24)
Bilirubin Total: 0.5 mg/dL (ref 0.0–1.2)
CALCIUM: 8.5 mg/dL — AB (ref 8.7–10.2)
CO2: 23 mmol/L (ref 20–29)
CREATININE: 1.19 mg/dL — AB (ref 0.57–1.00)
Chloride: 105 mmol/L (ref 96–106)
GFR calc Af Amer: 59 mL/min/{1.73_m2} — ABNORMAL LOW (ref >59)
GFR, EST NON AFRICAN AMERICAN: 51 mL/min/{1.73_m2} — AB (ref >59)
Globulin, Total: 3.5 g/dL (ref 1.5–4.5)
Glucose: 149 mg/dL — ABNORMAL HIGH (ref 65–99)
Potassium: 4.9 mmol/L (ref 3.5–5.2)
Sodium: 142 mmol/L (ref 134–144)
TOTAL PROTEIN: 6.3 g/dL (ref 6.0–8.5)

## 2017-06-15 LAB — GAMMA GT: GGT: 78 IU/L — ABNORMAL HIGH (ref 0–60)

## 2017-06-15 NOTE — Progress Notes (Signed)
  Echocardiogram 2D Echocardiogram has been performed.  Andrea Glenn 06/15/2017, 3:48 PM

## 2017-06-15 NOTE — Progress Notes (Signed)
   Subjective   Patient ID: Andrea Glenn    DOB: 03-22-1961, 57 y.o. female   MRN: [id number]  CC: "Swelling"  HPI: Andrea Glenn is a 57 y.o. female who presents to clinic today for the following:  Leg swelling: Patient continues to have bilateral leg swelling.  This is been an ongoing issue for the past several weeks gradual onset.  Patient states she has been taking the Lasix with good urine output.  She denies fevers or chills, chest pain, shortness of breath, abdominal pain, constipation or diarrhea, melena or hematochezia, hematuria, dysuria, syncope.  She was seen 2 days prior for the same problem with persistent acute kidney injury without change while using Lasix.  She subsequently got an echocardiogram which showed grade 2 diastolic dysfunction otherwise unremarkable.  He denies IV drug use history.  She is a current everyday smoker.  She denies alcohol use.    ROS: see HPI for pertinent.  [place]: Poorly controlled T2DM, recent pancreatitis, tobacco use disorder, RLS, HLD.Surgical shoulder and foot surgery, c-sec, back surgery. Family history CKD, DM, thyroid disease, HTN, HF, stroke, cancer (sister).  Smoking status reviewed. Medications reviewed.  Objective   BP 140/72   Pulse 86   Temp 98.2 F (36.8 C) (Oral)   Ht '5\' 4"'$  (1.626 m)   Wt 140 lb 9.6 oz (63.8 kg)   SpO2 93%   BMI 24.13 kg/m  Vitals and nursing note reviewed.  General: well nourished, well developed, NAD with non-toxic appearance HEENT: normocephalic, atraumatic, moist mucous membranes Neck: supple, non-tender without lymphadenopathy Cardiovascular: regular rate and rhythm without murmurs, rubs, or gallops Lungs: clear to auscultation bilaterally with normal work of breathing Abdomen: soft, non-tender, non-distended, normoactive bowel sounds Skin: warm, dry, no rashes or lesions, cap refill < 2 seconds Extremities: warm and well perfused, normal tone, 2+ lower extremity edema bilaterally up to knees, 2+  pedal pulse bilaterally  Assessment & Plan   Lower extremity edema Chronic.  Suspect underlying venous insufficiency.  No signs of anasarca.  Recent 2D echocardiogram unremarkable.  LFT unremarkable with exception to little elevation of alk phos.  Creatinine function stable but low baseline that this seems to be consistent with her previous labs by chart review.  Less concerning for DVT given lack of hypoxia or tachycardia and bilateral involvement.  Patient without history of colonoscopy.  Will check CBC to rule out anemia.  Would consider pelvic exam and abdominal/pelvic CT during next visit if unremarkable. - CBC with differential - Instructed patient to get over-the-counter compression stockings - Advised patient to wean down Lasix over the next few days - Reviewed return precautions - RTC 4 days  Restless leg syndrome Chronic.  Orders Placed This Encounter  Procedures  . CBC with Differential/Platelet   No orders of the defined types were placed in this encounter.   Harriet Butte, [place], PGY-2 06/16/2017, 11:51 AM

## 2017-06-16 ENCOUNTER — Encounter: Payer: Self-pay | Admitting: Family Medicine

## 2017-06-16 ENCOUNTER — Ambulatory Visit (INDEPENDENT_AMBULATORY_CARE_PROVIDER_SITE_OTHER): Payer: Self-pay | Admitting: Family Medicine

## 2017-06-16 ENCOUNTER — Other Ambulatory Visit: Payer: Self-pay

## 2017-06-16 VITALS — BP 140/72 | HR 86 | Temp 98.2°F | Ht 64.0 in | Wt 140.6 lb

## 2017-06-16 DIAGNOSIS — R600 Localized edema: Secondary | ICD-10-CM

## 2017-06-16 DIAGNOSIS — R5383 Other fatigue: Secondary | ICD-10-CM

## 2017-06-16 NOTE — Patient Instructions (Signed)
Thank you for coming in to see [place] today. Please see below to review our plan for today's visit.  We will get some blood work to check if you were anemic.  I would like to see you again on Monday.  Go purchase compression stockings and use them first thing when you wake up prior to getting out of bed.  You do not need to wear these while asleep but you do need to keep your feet propped up above your heart as often as possible.  Please call the clinic at [phone] if your symptoms worsen or you have any concerns. It was our pleasure to serve you.  Harriet Butte, [place], PGY-2

## 2017-06-16 NOTE — Assessment & Plan Note (Signed)
Chronic. 

## 2017-06-16 NOTE — Assessment & Plan Note (Addendum)
Chronic.  Suspect underlying venous insufficiency.  No signs of anasarca.  Recent 2D echocardiogram unremarkable.  LFT unremarkable with exception to little elevation of alk phos.  Creatinine function stable but low baseline that this seems to be consistent with her previous labs by chart review.  Less concerning for DVT given lack of hypoxia or tachycardia and bilateral involvement.  Patient without history of colonoscopy.  Will check CBC to rule out anemia.  Would consider pelvic exam and abdominal/pelvic CT during next visit if unremarkable. - CBC with differential - Instructed patient to get over-the-counter compression stockings - Advised patient to wean down Lasix over the next few days - Reviewed return precautions - RTC 4 days

## 2017-06-17 ENCOUNTER — Encounter: Payer: Self-pay | Admitting: Family Medicine

## 2017-06-17 LAB — CBC WITH DIFFERENTIAL/PLATELET
BASOS ABS: 0.1 10*3/uL (ref 0.0–0.2)
Basos: 1 %
EOS (ABSOLUTE): 0.2 10*3/uL (ref 0.0–0.4)
Eos: 2 %
HEMOGLOBIN: 10.1 g/dL — AB (ref 11.1–15.9)
Hematocrit: 31.3 % — ABNORMAL LOW (ref 34.0–46.6)
IMMATURE GRANS (ABS): 0 10*3/uL (ref 0.0–0.1)
IMMATURE GRANULOCYTES: 0 %
LYMPHS: 28 %
Lymphocytes Absolute: 2.7 10*3/uL (ref 0.7–3.1)
MCH: 27.4 pg (ref 26.6–33.0)
MCHC: 32.3 g/dL (ref 31.5–35.7)
MCV: 85 fL (ref 79–97)
MONOCYTES: 4 %
Monocytes Absolute: 0.4 10*3/uL (ref 0.1–0.9)
NEUTROS PCT: 65 %
Neutrophils Absolute: 6.2 10*3/uL (ref 1.4–7.0)
PLATELETS: 253 10*3/uL (ref 150–379)
RBC: 3.69 x10E6/uL — AB (ref 3.77–5.28)
RDW: 16.5 % — ABNORMAL HIGH (ref 12.3–15.4)
WBC: 9.7 10*3/uL (ref 3.4–10.8)

## 2017-06-20 NOTE — Progress Notes (Signed)
Subjective   Patient ID: Andrea Glenn    DOB: 1961/04/07, 57 y.o. female   MRN: [id number]  CC: "Leg swelling"  HPI: [name] is a 57 y.o. female who presents to clinic today for the following:  Lower extremity swelling: Unchanged since last week. Has been ongoing issue for past month without known trigger. Not improved with use of lasix which she has now weaned off.  She has a normal 2-D echo with G2DD, otherwise preserved EF. Liver enzymes have been WNL with exception to incidental elevated alk phos with elevated GTT. She has chronic hypoalbuminemia but has been stable for many years. Concerned edema may be third spacing secondary to occult blood loss.  Normocytic anemia: New diagnosis. No prior history of anemia. Patient does not take iron supplements. She denies vaginal bleeding, constipation or diarrhea, melena or hematochezia. She states she has never undergone a screening colonoscopy. She denies syncope of fatigue.  Bilateral leg pain: Bilateral involvement primarily since new onset lower extremity swelling. Not improved with ted hose. Denies fevers or chills, chest pain, shortness of breath, loss of motor function. Improves with elevation of lower extremities.  ROS: see HPI for pertinent.  [place]: Poorly controlled T2DM, recent pancreatitis, tobacco use disorder, RLS, HLD.Surgical shoulder and foot surgery, c-sec, back surgery. FamilyhistoryCKD, DM, thyroid disease, HTN, HF, stroke,cancer(sister). Smoking status reviewed. Medications reviewed.  Objective   BP (!) 180/90   Pulse 91   Temp 98.3 F (36.8 C) (Oral)   Ht '5\' 4"'  (1.626 m)   Wt 140 lb (63.5 kg)   SpO2 98%   BMI 24.03 kg/m  Vitals and nursing note reviewed.  General: woman with moderate distress from bilateral edema and pain with non-toxic appearance HEENT: normocephalic, atraumatic, moist mucous membranes Neck: supple, non-tender without lymphadenopathy, no JVD Cardiovascular: regular rate and  rhythm without murmurs, rubs, or gallops Lungs: clear to auscultation bilaterally with normal work of breathing Abdomen: soft, non-tender, non-distended, normoactive bowel sounds, no masses or hepatosplenomegaly Skin: warm, dry, no rashes or lesions, cap refill < 2 seconds Extremities: warm and well perfused, normal tone, 2+ bilateral edema up to knees bilaterally, 2+ dorsal pedal pulse bilaterally, 5/5 motor strength on legs bilaterally  Assessment & Plan   Normocytic anemia Chronic. Persistent based on recent CBC. Need iron studies though appears to be normocytic with new elevated RDW. Will need to rule out GI bleed given no history of colonoscopy. - Diagnostic coloscopy referral placed - Checking iron studies - Reviewed return precations  Primary hypertension Chronic. Uncontrolled. Likely due to not taking antihypertensive. - Restarting HCTZ and lasix as mentioned  Lower extremity edema Subacute. Likely venous insufficiency. Could be third spacing which may be related to new anemia. - See plan for anemia - Encouraged patient to continue wearing compression stockings - Restart HCTZ 12.5 mg daily and Lasix 20 mg daily - Gabapentin 300 mg 3 times daily for neuropathic pain - RTC 1 week  Orders Placed This Encounter  Procedures  . Iron and TIBC  . Ferritin  . Ambulatory referral to Gastroenterology    Referral Priority:   Urgent    Referral Type:   Consultation    Referral Reason:   Specialty Services Required    Number of Visits Requested:   1  . POCT glycosylated hemoglobin (Hb A1C)   Meds ordered this encounter  Medications  . gabapentin (NEURONTIN) 300 MG capsule    Sig: Take 1 capsule (300 mg total) by mouth 3 (three) times  daily.    Dispense:  90 capsule    Refill:  [place], [place], PGY-2 06/21/2017, 8:45 PM

## 2017-06-21 ENCOUNTER — Ambulatory Visit (INDEPENDENT_AMBULATORY_CARE_PROVIDER_SITE_OTHER): Payer: Self-pay | Admitting: Family Medicine

## 2017-06-21 ENCOUNTER — Encounter: Payer: Self-pay | Admitting: Family Medicine

## 2017-06-21 ENCOUNTER — Other Ambulatory Visit: Payer: Self-pay

## 2017-06-21 VITALS — BP 180/90 | HR 91 | Temp 98.3°F | Ht 64.0 in | Wt 140.0 lb

## 2017-06-21 DIAGNOSIS — IMO0002 Reserved for concepts with insufficient information to code with codable children: Secondary | ICD-10-CM

## 2017-06-21 DIAGNOSIS — E1165 Type 2 diabetes mellitus with hyperglycemia: Secondary | ICD-10-CM

## 2017-06-21 DIAGNOSIS — N189 Chronic kidney disease, unspecified: Secondary | ICD-10-CM

## 2017-06-21 DIAGNOSIS — D631 Anemia in chronic kidney disease: Secondary | ICD-10-CM

## 2017-06-21 DIAGNOSIS — D649 Anemia, unspecified: Secondary | ICD-10-CM

## 2017-06-21 DIAGNOSIS — I10 Essential (primary) hypertension: Secondary | ICD-10-CM

## 2017-06-21 DIAGNOSIS — D638 Anemia in other chronic diseases classified elsewhere: Secondary | ICD-10-CM | POA: Insufficient documentation

## 2017-06-21 DIAGNOSIS — E1142 Type 2 diabetes mellitus with diabetic polyneuropathy: Secondary | ICD-10-CM

## 2017-06-21 DIAGNOSIS — R600 Localized edema: Secondary | ICD-10-CM

## 2017-06-21 HISTORY — DX: Anemia in chronic kidney disease: D63.1

## 2017-06-21 HISTORY — DX: Chronic kidney disease, unspecified: N18.9

## 2017-06-21 LAB — POCT GLYCOSYLATED HEMOGLOBIN (HGB A1C): Hemoglobin A1C: 6.6

## 2017-06-21 MED ORDER — GABAPENTIN 300 MG PO CAPS: 300.0000 mg | ORAL_CAPSULE | Freq: Three times a day (TID) | ORAL | 3 refills | Status: DC

## 2017-06-21 NOTE — Assessment & Plan Note (Addendum)
Chronic. Uncontrolled. Likely due to not taking antihypertensive. - Restarting HCTZ and lasix as mentioned

## 2017-06-21 NOTE — Assessment & Plan Note (Addendum)
Chronic. Persistent based on recent CBC. Need iron studies though appears to be normocytic with new elevated RDW. Will need to rule out GI bleed given no history of colonoscopy. - Diagnostic coloscopy referral placed - Checking iron studies - Reviewed return precations

## 2017-06-21 NOTE — Patient Instructions (Signed)
Thank you for coming in to see [place] today. Please see below to review our plan for today's visit.  1.  We will check some lab work to evaluate your anemia.  I placed a referral for you to get a diagnostic colonoscopy.  Expect to receive a call over the next week to schedule the appointment with the GI specialist. 2.  Restart your HCTZ and the Lasix and take together.  I will reevaluate your weights and lower extremity swelling in 1 week. 3.  You can discontinue your insulin. 4.  I placed a new prescription called gabapentin.  Start by taking one 300 mg tablet at night before bedtime.  You can increase up to 3 tablets prior to bedtime.  Please call the clinic at [phone] if your symptoms worsen or you have any concerns. It was our pleasure to serve you.  Harriet Butte, [place], PGY-2

## 2017-06-21 NOTE — Assessment & Plan Note (Addendum)
Subacute. Likely venous insufficiency. Could be third spacing which may be related to new anemia. - See plan for anemia - Encouraged patient to continue wearing compression stockings - Restart HCTZ 12.5 mg daily and Lasix 20 mg daily - Gabapentin 300 mg 3 times daily for neuropathic pain - RTC 1 week

## 2017-06-22 LAB — IRON AND TIBC
Iron Saturation: 12 % — ABNORMAL LOW (ref 15–55)
Iron: 28 ug/dL (ref 27–159)
TIBC: 240 ug/dL — AB (ref 250–450)
UIBC: 212 ug/dL (ref 131–425)

## 2017-06-22 LAB — FERRITIN: Ferritin: 245 ng/mL — ABNORMAL HIGH (ref 15–150)

## 2017-06-23 ENCOUNTER — Encounter: Payer: Self-pay | Admitting: Pharmacist

## 2017-06-23 ENCOUNTER — Ambulatory Visit (INDEPENDENT_AMBULATORY_CARE_PROVIDER_SITE_OTHER): Payer: Self-pay | Admitting: Pharmacist

## 2017-06-23 DIAGNOSIS — IMO0002 Reserved for concepts with insufficient information to code with codable children: Secondary | ICD-10-CM

## 2017-06-23 DIAGNOSIS — E1142 Type 2 diabetes mellitus with diabetic polyneuropathy: Secondary | ICD-10-CM

## 2017-06-23 DIAGNOSIS — I10 Essential (primary) hypertension: Secondary | ICD-10-CM

## 2017-06-23 DIAGNOSIS — F17200 Nicotine dependence, unspecified, uncomplicated: Secondary | ICD-10-CM

## 2017-06-23 DIAGNOSIS — R600 Localized edema: Secondary | ICD-10-CM

## 2017-06-23 DIAGNOSIS — E1165 Type 2 diabetes mellitus with hyperglycemia: Secondary | ICD-10-CM

## 2017-06-23 NOTE — Progress Notes (Addendum)
S:    Patient arrives in good spirits, ambulating without assistance, but walking slowly, cautiously.  Well-known patient to the pharmacy clinic presents for diabetes evaluation, education, and management. Patient was last seen by Primary Care Provider on 06/16/2017.   Patient endorses taking the furosemide prescribed at last visit, but denies increased urination since using this medication. She reports she often works her entire shift at work without needing to use the restroom. Patient endorses that the fluid in her legs decreased most when she slept for about 10 hours one night. Patient reports standing about 8 hours a day at work on memory foam mats. She does sit during her 45 and 15 minute break but is not able to elevate her feet during these times.  Patient endorses trying to wear compression stockings (to her knee) earlier this week, but it was too painful. Patient states she has only had 2 cigarettes today. She states she is contemplating quitting 3-4 days after her birthday. She feels very confident that she will be successful with tobacco cessation. She reports she would like to use the nicotine patches to help her with this.  Patient reports that she checks her blood sugar in the mornings and is typically in the high 70s. She denies checking post-prandial sugars.    Patient reports adherence with medications.  Current diabetes medications include: Trulicity 0.75mg  weekly Current hypertension medications include: none currently  Patient denies hypoglycemic events.  Patient reported dietary habits: Eats 3 meals/day Breakfast: boiled egg and coffee Lunch: cheddar potatoes, chips, cranberry juice with mango  Dinner: salad with smoked [place] (low-sodium) Snacks: morning snack of teddy grahams and fruit snacks Drinks:water  Patient reported exercise habits:  Standing at work, not much exercise outside of work due to pain in legs.    O:  Physical Exam  Constitutional: She appears  well-developed and well-nourished.  Musculoskeletal: She exhibits edema ( 3+) and tenderness.  Psychiatric: She has a normal mood and affect.     Review of Systems  Respiratory: Negative for shortness of breath.   Cardiovascular: Positive for leg swelling.  Gastrointestinal: Negative for abdominal pain, nausea and vomiting.  All other systems reviewed and are negative.    Lab Results  Component Value Date   HGBA1C 6.6 06/21/2017   Vitals:   06/23/17 1536  BP: 135/84  Pulse: 93  SpO2: 98%    A/P: Diabetes longstanding currently well-controlled-- patient denies hypoglycemic events and is able to verbalize appropriate hypoglycemia management plan. Patient reports adherence with medication. Continued Trulicity (dulaglutide) at same dose. No changes made to this medication today as last reported A1c was 6.6.  Next A1C anticipated June 2019.    Continued tobacco use-- has reduced to only 2 cigarettes today. Nicotine patches sent to pharmacy at last visit. Patient plans full tobacco cessation about 3-4 days after her birthday and will utilize nicotine patch assistance. Nicotine patches sent to pharmacy at her last visit. Will call to ensure these will be covered by insurance. Called Pharmacy and determined that the [facility] likely was inappropriate or Medicaid family planning coverage was inappropriately set up.  Pharmacist at [facility] will f/u iwht Medicaid.    Bilateral lower extremity edema--patient is adherent with furosemide 20mg  prescribed at previous appointment. Continues to have 3+ edema bilaterally in lower legs. Advised patient to increase intake of protein through eggs, protein drinks and bars as patient does not like most meat. Encouraged compliance with compression stockings. Ordered BMP and TSH.   Hypertension--patient is  currently not taking any medications for hypertension. Counselsed patient to decrease sodium intake and on which foods she is currently eating that have high sodium.      Written patient instructions provided.  Total time in face to face counseling 30 minutes.   Follow up in Pharmacist Clinic Visit 3 weeks.   Patient seen with Hildred Alamin, PharmD Candidate and Jalene Mullet, PharmD, PGY1 Resident and Deirdre Pippins, PGY2 Pharmacy Resident, PharmD, BCPS.  Scr elevated to highest level (1.5) since starting furosemide.  Contacted via phone - left message to return call to discuss stopping her furosemide.

## 2017-06-23 NOTE — Assessment & Plan Note (Signed)
Continued tobacco use-- has reduced to only 2 cigarettes today. Nicotine patches sent to pharmacy at last visit. Patient plans full tobacco cessation about 3-4 days after her birthday and will utilize nicotine patch assistance. Nicotine patches sent to pharmacy at her last visit. Will call to ensure these will be covered by insurance. Called Pharmacy and determined that the [facility] likely was inappropriate or Medicaid family planning coverage was inappropriately set up.  Pharmacist at [facility] will f/u iwht Medicaid.

## 2017-06-23 NOTE — Patient Instructions (Addendum)
It was nice to see you today. You are doing so well with your diabetes management, I am proud of you!   1. Continue to increase your protein intake. Try some different kinds of protein drinks and eat an extra egg every day. You can also try protein bars, like Think Thin, Premier Protein, and Power Crunch. 2. Continue to prop up your feet throughout the day for more time each day. This will help with the fluid in your legs to decrease. Try to use the compression socks again on a day off of work. Put them on first thing in the morning to see if that feels better.  3. A couple of days after your birthday start using the nicotine patches that I am going to call in to your pharmacy.  4. Pick up some over-the-counter iron tablets from the pharmacy. Start taking one tablet a day. This may cause your stool to become a dark color and may be constipating.

## 2017-06-23 NOTE — Assessment & Plan Note (Signed)
Bilateral lower extremity edema--patient is adherent with furosemide 20mg  prescribed at previous appointment. Continues to have 3+ edema bilaterally in lower legs. Advised patient to increase intake of protein through eggs, protein drinks and bars as patient does not like most meat. Encouraged compliance with compression stockings. Ordered BMP and TSH.

## 2017-06-23 NOTE — Assessment & Plan Note (Signed)
Diabetes longstanding currently well-controlled-- patient denies hypoglycemic events and is able to verbalize appropriate hypoglycemia management plan. Patient reports adherence with medication. Continued Trulicity (dulaglutide) at same dose. No changes made to this medication today as last reported A1c was 6.6.  Next A1C anticipated June 2019.

## 2017-06-24 LAB — TSH: TSH: 0.373 u[IU]/mL — ABNORMAL LOW (ref 0.450–4.500)

## 2017-06-24 LAB — BASIC METABOLIC PANEL
BUN / CREAT RATIO: 18 (ref 9–23)
BUN: 27 mg/dL — ABNORMAL HIGH (ref 6–24)
CO2: 23 mmol/L (ref 20–29)
CREATININE: 1.5 mg/dL — AB (ref 0.57–1.00)
Calcium: 8.7 mg/dL (ref 8.7–10.2)
Chloride: 104 mmol/L (ref 96–106)
GFR, EST AFRICAN AMERICAN: 44 mL/min/{1.73_m2} — AB (ref >59)
GFR, EST NON AFRICAN AMERICAN: 38 mL/min/{1.73_m2} — AB (ref >59)
Glucose: 188 mg/dL — ABNORMAL HIGH (ref 65–99)
POTASSIUM: 5 mmol/L (ref 3.5–5.2)
SODIUM: 141 mmol/L (ref 134–144)

## 2017-06-27 ENCOUNTER — Telehealth: Payer: Self-pay | Admitting: Pharmacist

## 2017-06-27 NOTE — Telephone Encounter (Signed)
Attempted to call RE the lab result.Requested call back today if possible. to discuss stopping furosemide.

## 2017-06-27 NOTE — Telephone Encounter (Signed)
-----   Message from Zenia Resides, MD sent at 06/24/2017  9:22 AM EST -----   ----- Message ----- From: Lavone Neri Lab Results In Sent: 06/24/2017   8:29 AM To: Zenia Resides, MD

## 2017-06-28 ENCOUNTER — Ambulatory Visit (INDEPENDENT_AMBULATORY_CARE_PROVIDER_SITE_OTHER): Payer: Self-pay | Admitting: Family Medicine

## 2017-06-28 ENCOUNTER — Encounter: Payer: Self-pay | Admitting: Family Medicine

## 2017-06-28 ENCOUNTER — Other Ambulatory Visit: Payer: Self-pay

## 2017-06-28 VITALS — BP 158/72 | HR 86 | Temp 98.1°F | Ht 64.0 in | Wt 134.2 lb

## 2017-06-28 DIAGNOSIS — I10 Essential (primary) hypertension: Secondary | ICD-10-CM

## 2017-06-28 DIAGNOSIS — R600 Localized edema: Secondary | ICD-10-CM

## 2017-06-28 DIAGNOSIS — D638 Anemia in other chronic diseases classified elsewhere: Secondary | ICD-10-CM

## 2017-06-28 MED ORDER — HYDROCHLOROTHIAZIDE 12.5 MG PO TABS
12.5000 mg | ORAL_TABLET | Freq: Every day | ORAL | 3 refills | Status: DC
Start: 2017-06-28 — End: 2017-07-25

## 2017-06-28 NOTE — Assessment & Plan Note (Addendum)
Subacute.  Uncertain etiology.  Edema has improved with diuretic use and weight is down 6 pounds since last visit 1 week ago.  Differential includes GI bleed, chronic kidney disease, venous insufficiency.  Patient resistant to lab draws today.  Would benefit from nephrology evaluation though lack of insurance is a barrier. - Ambulatory referral to nephrology - Instructed to continue Lasix and HCTZ along with compression hose - Reviewed return precautions - RTC 3/22 for reevaluation of weight and laboratory calls including CMET and 24-hour urine creatinine and protein

## 2017-06-28 NOTE — Assessment & Plan Note (Addendum)
Chronic.  Seems to have plateaued over the last 8 months.  Iron studies not consistent with IDA.  Normocytic by MCV.  No signs of active bleeding.  Would benefit from colonoscopy however lack of insurance is a barrier. - Advised patient to consider getting a colonoscopy when she receives health insurance - Reviewed red flags regarding colorectal cancer

## 2017-06-28 NOTE — Progress Notes (Signed)
Subjective   Patient ID: Andrea Glenn    DOB: 1960/05/27, 57 y.o. female   MRN: [id number]  CC: "Lower extremity swelling follow-up"  HPI: Andrea Glenn is a 57 y.o. female who presents to clinic today for the following:  Lower extremity swelling: Onset 1 month ago with bilateral edema to knees.  Has undergone extensive workup including normal echocardiogram, worsening creatinine (0.6 > 1.1) function though seems to be consistent with prior labs over the past few years, and iron studies given signs of anemia over the last.  She was referred to GI for diagnostic colonoscopy though patient was unable to set up appointment due to lack of insurance.  Patient has been taking HCTZ and Lasix daily along with using compression hose with improvement of edema.  She continues to be without shortness of breath or chest pain.  Anemia: Underwent iron studies during last visit.  Not consistent with iron deficiency anemia.  Seems to be more related to anemia of chronic inflammation.  Patient continues to be without melena or hematochezia.  She also denies vaginal bleeding.  She is not taking any iron tablets.  Hypertension: Patient denies taking HCTZ and Lasix today but has otherwise been adherent.  She is a current everyday smoker.  ROS: see HPI for pertinent.  [place]: Poorly controlled T2DM, recent pancreatitis, tobacco use disorder, RLS, HLD.Surgical shoulder and foot surgery, c-sec, back surgery. FamilyhistoryCKD, DM, thyroid disease, HTN, HF, stroke,cancer(sister). Smoking status reviewed. Medications reviewed.  Objective   BP (!) 158/72   Pulse 86   Temp 98.1 F (36.7 C) (Oral)   Ht 5\' 4"  (1.626 m)   Wt 134 lb 3.2 oz (60.9 kg)   SpO2 99%   BMI 23.04 kg/m  Vitals and nursing note reviewed.  General: well nourished, well developed, NAD with non-toxic appearance HEENT: normocephalic, atraumatic, moist mucous membranes Neck: supple, non-tender without lymphadenopathy Cardiovascular:  regular rate and rhythm without murmurs, rubs, or gallops Lungs: clear to auscultation bilaterally with normal work of breathing Abdomen: soft, non-tender, non-distended, normoactive bowel sounds Skin: warm, dry, no rashes or lesions, cap refill < 2 seconds Extremities: warm and well perfused, normal tone, 2+ edema bilaterally up to upper shin with some improvement of peripatellar edema with left being slightly larger than right which is chronic, 2+ dorsal pedal pulse present bilaterally Psych: euthymic mood, congruent affect  Assessment & Plan   Lower extremity edema Subacute.  Uncertain etiology.  Edema has improved with diuretic use and weight is down 6 pounds since last visit 1 week ago.  Differential includes GI bleed, chronic kidney disease, venous insufficiency.  Patient resistant to lab draws today.  Would benefit from nephrology evaluation though lack of insurance is a barrier. - Ambulatory referral to nephrology - Instructed to continue Lasix and HCTZ along with compression hose - Reviewed return precautions - RTC 3/22 for reevaluation of weight and laboratory calls including CMET and 24-hour urine creatinine and protein  Anemia of chronic disease Chronic.  Seems to have plateaued over the last 8 months.  Iron studies not consistent with IDA.  Normocytic by MCV.  No signs of active bleeding.  Would benefit from colonoscopy however lack of insurance is a barrier. - Advised patient to consider getting a colonoscopy when she receives health insurance - Reviewed red flags regarding colorectal cancer  Primary hypertension Chronic.  Suboptimal control.  Likely due to not taking medications today.  Patient endorses adherence but continues to smoke. - Advised patient to stop smoking -  Refilled HCTZ 12.5 mg daily and continue Lasix 20 mg daily  Orders Placed This Encounter  Procedures  . Ambulatory referral to Nephrology    Referral Priority:   Urgent    Referral Type:   Consultation      Referral Reason:   Specialty Services Required    Requested Specialty:   Nephrology    Number of Visits Requested:   1   Meds ordered this encounter  Medications  . hydrochlorothiazide (HYDRODIURIL) 12.5 MG tablet    Sig: Take 1 tablet (12.5 mg total) by mouth daily.    Dispense:  90 tablet    Refill:  [place], [place], PGY-2 06/28/2017, 5:29 PM

## 2017-06-28 NOTE — Patient Instructions (Signed)
Thank you for coming in to see [place] today. Please see below to review our plan for today's visit.  Continue taking the Lasix and the HCTZ.  Since in a refill of your HCTZ to the pharmacy.  We will have you follow-up on 07/08/2017 at 10:55 in the morning.  We will get blood work and urine studies at this time.  I will send in a referral for you to be seen by the kidney doctors.  In the meantime, it would be best if you reapply for Obama care or contact our clinic and talk to [facility] about getting an orange card when you stop working.  Please call the clinic at [phone] if your symptoms worsen or you have any concerns. It was our pleasure to serve you.  Harriet Butte, [place], PGY-2

## 2017-06-28 NOTE — Assessment & Plan Note (Addendum)
Chronic.  Suboptimal control.  Likely due to not taking medications today.  Patient endorses adherence but continues to smoke. - Advised patient to stop smoking - Refilled HCTZ 12.5 mg daily and continue Lasix 20 mg daily

## 2017-07-06 ENCOUNTER — Other Ambulatory Visit: Payer: Self-pay | Admitting: Family Medicine

## 2017-07-06 DIAGNOSIS — R600 Localized edema: Secondary | ICD-10-CM

## 2017-07-07 NOTE — Progress Notes (Signed)
Subjective   Patient ID: Andrea Glenn    DOB: May 09, 1960, 57 y.o. female   MRN: [id number]  CC: "Leg swelling"  HPI: Andrea Glenn is a 57 y.o. female who presents to clinic today for the following:  Leg swelling: Patient is returning today for follow-up concerning bilateral lower extremity edema over the last 2 months.  She is undergone extensive workup including a normal echocardiogram, worsening creatinine (0.6 > 1.5) which seems consistent with prior creatinine levels over the past few years, and iron studies consistent with anemia of inflammatory state.  She was initially referred to GI for diagnostic colonoscopy but due to lack of insurance and inability to take, was not able to follow-up accordingly.  She was also recommended to continue the HCTZ and Lasix for improved weight loss and decrease edema.  She continues to lose weight and is now at her dry weight of 126 lbs.  She continues to have pain primarily on her ankles and feet and has been using OTC Biofreeze to alleviate the pain without much improvement.  Denies shortness of breath or chest pain, melena or hematochezia, decreased urination.  ROS: see HPI for pertinent.  [place]: Poorly controlled T2DM, recent pancreatitis, tobacco use disorder, RLS, HLD.Surgical shoulder and foot surgery, c-sec, back surgery. FamilyhistoryCKD, DM, thyroid disease, HTN, HF, stroke,cancer(sister). Smoking status reviewed. Medications reviewed.  Objective   BP (!) 160/100 (BP Location: Left Arm, Patient Position: Sitting, Cuff Size: Normal)   Pulse 85   Temp 98 F (36.7 C) (Oral)   Wt 126 lb (57.2 kg)   SpO2 98%   BMI 21.63 kg/m  Vitals and nursing note reviewed.  General: well nourished, well developed, NAD with non-toxic appearance HEENT: normocephalic, atraumatic, moist mucous membranes Cardiovascular: regular rate and rhythm without murmurs, rubs, or gallops Lungs: clear to auscultation bilaterally with normal work of  breathing Skin: warm, dry, no rashes or lesions, cap refill < 2 seconds, dry feet up to ankle bilaterally Extremities: warm and well perfused, normal tone, 2+ dorsal pedal pulse present bilaterally, 1+ edema bilaterally up to mid shin with left greater than right consistent with chronic edema  Assessment & Plan   CKD (chronic kidney disease) Acute though concerning for chronic given multiple comorbidities including HTN, DM, HLD, and smoking.  Suspect there may be a component of vascular insufficiency though pain is probably controlled neuropathy.  Patient has been self-medicating with Biofreeze.  We will need to reassess kidney function and obtain 24-hour urine protein and creatinine to determine kidney function state.  Would benefit from colonoscopy and nephrology referral though patient is limited given no insurance and inability to obtain orange card with current job which she plans to and by next month.  Hopefully she will be able to receive care once and orange card is obtained. - Repeat CMET, obtain 24-hour urine protein and creatinine - Instructed to discontinue Lasix and increase HCTZ to 25 mg daily - Reviewed return precautions - RTC 2 weeks  Primary hypertension Chronic.  Poorly controlled.  Has long-standing poorly controlled HTN despite current antihypertensive therapy including thiazide and loop diuretic.  Current everyday smoker.  Diuretic use complicated with worsening creatinine function over the last month.  Suspect there may be a component of chronic kidney disease. - Instructed to increase HCTZ to 25 mg daily - May consider adding ACE inhibitor or ARB during next visit if kidney function permits  Orders Placed This Encounter  Procedures  . CMP14+EGFR  . Protein, urine, 24 hour  Standing Status:   Future    Standing Expiration Date:   07/08/2018  . Creatinine, Urine, 24 Hour    Standing Status:   Future    Standing Expiration Date:   07/08/2018   No orders of the defined  types were placed in this encounter.   Harriet Butte, [place], PGY-2 07/08/2017, 1:44 PM

## 2017-07-08 ENCOUNTER — Encounter: Payer: Self-pay | Admitting: Family Medicine

## 2017-07-08 ENCOUNTER — Ambulatory Visit (INDEPENDENT_AMBULATORY_CARE_PROVIDER_SITE_OTHER): Payer: Self-pay | Admitting: Family Medicine

## 2017-07-08 VITALS — BP 160/100 | HR 85 | Temp 98.0°F | Wt 126.0 lb

## 2017-07-08 DIAGNOSIS — N184 Chronic kidney disease, stage 4 (severe): Secondary | ICD-10-CM | POA: Insufficient documentation

## 2017-07-08 DIAGNOSIS — N189 Chronic kidney disease, unspecified: Secondary | ICD-10-CM

## 2017-07-08 DIAGNOSIS — I10 Essential (primary) hypertension: Secondary | ICD-10-CM

## 2017-07-08 HISTORY — DX: Chronic kidney disease, stage 4 (severe): N18.4

## 2017-07-08 NOTE — Assessment & Plan Note (Addendum)
Acute though concerning for chronic given multiple comorbidities including HTN, DM, HLD, and smoking.  Suspect there may be a component of vascular insufficiency though pain is probably controlled neuropathy.  Patient has been self-medicating with Biofreeze.  We will need to reassess kidney function and obtain 24-hour urine protein and creatinine to determine kidney function state.  Would benefit from colonoscopy and nephrology referral though patient is limited given no insurance and inability to obtain orange card with current job which she plans to and by next month.  Hopefully she will be able to receive care once and orange card is obtained. - Repeat CMET, obtain 24-hour urine protein and creatinine - Instructed to discontinue Lasix and increase HCTZ to 25 mg daily - Reviewed return precautions - RTC 2 weeks

## 2017-07-08 NOTE — Assessment & Plan Note (Signed)
Chronic.  Poorly controlled.  Has long-standing poorly controlled HTN despite current antihypertensive therapy including thiazide and loop diuretic.  Current everyday smoker.  Diuretic use complicated with worsening creatinine function over the last month.  Suspect there may be a component of chronic kidney disease. - Instructed to increase HCTZ to 25 mg daily - May consider adding ACE inhibitor or ARB during next visit if kidney function permits

## 2017-07-08 NOTE — Patient Instructions (Signed)
Thank you for coming in to see Andrea Glenn today. Please see below to review our plan for today's visit.  Stop the Lasix and increase your HCTZ 25 mg daily.  I would like to see you again in 2 weeks.  I will call you if your lab results are abnormal.  Do not take the Biofreeze for your neuropathy.  We will likely increase your gabapentin based on your labs.  Please call the clinic at [phone] if your symptoms worsen or you have any concerns. It was our pleasure to serve you.  Harriet Butte, [place], PGY-2

## 2017-07-09 LAB — CMP14+EGFR
ALBUMIN: 3.4 g/dL — AB (ref 3.5–5.5)
ALK PHOS: 166 IU/L — AB (ref 39–117)
ALT: 6 IU/L (ref 0–32)
AST: 18 IU/L (ref 0–40)
Albumin/Globulin Ratio: 0.9 — ABNORMAL LOW (ref 1.2–2.2)
BILIRUBIN TOTAL: 0.5 mg/dL (ref 0.0–1.2)
BUN/Creatinine Ratio: 20 (ref 9–23)
BUN: 26 mg/dL — AB (ref 6–24)
CHLORIDE: 104 mmol/L (ref 96–106)
CO2: 20 mmol/L (ref 20–29)
CREATININE: 1.31 mg/dL — AB (ref 0.57–1.00)
Calcium: 9.6 mg/dL (ref 8.7–10.2)
GFR calc non Af Amer: 45 mL/min/{1.73_m2} — ABNORMAL LOW (ref >59)
GFR, EST AFRICAN AMERICAN: 52 mL/min/{1.73_m2} — AB (ref >59)
GLUCOSE: 365 mg/dL — AB (ref 65–99)
Globulin, Total: 3.8 g/dL (ref 1.5–4.5)
Potassium: 4.4 mmol/L (ref 3.5–5.2)
Sodium: 143 mmol/L (ref 134–144)
TOTAL PROTEIN: 7.2 g/dL (ref 6.0–8.5)

## 2017-07-10 ENCOUNTER — Encounter: Payer: Self-pay | Admitting: Family Medicine

## 2017-07-12 ENCOUNTER — Emergency Department (HOSPITAL_COMMUNITY): Payer: Medicaid Other

## 2017-07-12 ENCOUNTER — Telehealth: Payer: Self-pay | Admitting: Pharmacist

## 2017-07-12 ENCOUNTER — Other Ambulatory Visit: Payer: Self-pay

## 2017-07-12 ENCOUNTER — Emergency Department (HOSPITAL_COMMUNITY)
Admission: EM | Admit: 2017-07-12 | Discharge: 2017-07-12 | Disposition: A | Discharge status: Home / Self Care | Payer: Medicaid Other | Attending: Emergency Medicine | Admitting: Emergency Medicine

## 2017-07-12 ENCOUNTER — Encounter (HOSPITAL_COMMUNITY): Payer: Self-pay

## 2017-07-12 DIAGNOSIS — Z794 Long term (current) use of insulin: Secondary | ICD-10-CM | POA: Insufficient documentation

## 2017-07-12 DIAGNOSIS — R600 Localized edema: Secondary | ICD-10-CM | POA: Insufficient documentation

## 2017-07-12 DIAGNOSIS — E1140 Type 2 diabetes mellitus with diabetic neuropathy, unspecified: Secondary | ICD-10-CM | POA: Insufficient documentation

## 2017-07-12 DIAGNOSIS — E1122 Type 2 diabetes mellitus with diabetic chronic kidney disease: Secondary | ICD-10-CM | POA: Insufficient documentation

## 2017-07-12 DIAGNOSIS — F17210 Nicotine dependence, cigarettes, uncomplicated: Secondary | ICD-10-CM | POA: Insufficient documentation

## 2017-07-12 DIAGNOSIS — M79673 Pain in unspecified foot: Secondary | ICD-10-CM | POA: Insufficient documentation

## 2017-07-12 DIAGNOSIS — N189 Chronic kidney disease, unspecified: Secondary | ICD-10-CM | POA: Insufficient documentation

## 2017-07-12 DIAGNOSIS — I129 Hypertensive chronic kidney disease with stage 1 through stage 4 chronic kidney disease, or unspecified chronic kidney disease: Secondary | ICD-10-CM | POA: Insufficient documentation

## 2017-07-12 DIAGNOSIS — Z79899 Other long term (current) drug therapy: Secondary | ICD-10-CM | POA: Insufficient documentation

## 2017-07-12 HISTORY — DX: Essential (primary) hypertension: I10

## 2017-07-12 LAB — BASIC METABOLIC PANEL
Anion gap: 9 (ref 5–15)
BUN: 29 mg/dL — ABNORMAL HIGH (ref 6–20)
CALCIUM: 8.9 mg/dL (ref 8.9–10.3)
CO2: 25 mmol/L (ref 22–32)
CREATININE: 1.46 mg/dL — AB (ref 0.44–1.00)
Chloride: 104 mmol/L (ref 101–111)
GFR calc non Af Amer: 39 mL/min — ABNORMAL LOW (ref >60)
GFR, EST AFRICAN AMERICAN: 45 mL/min — AB (ref >60)
GLUCOSE: 222 mg/dL — AB (ref 65–99)
Potassium: 3.8 mmol/L (ref 3.5–5.1)
Sodium: 138 mmol/L (ref 135–145)

## 2017-07-12 LAB — CBC WITH DIFFERENTIAL/PLATELET
BASOS PCT: 1 %
Basophils Absolute: 0.1 10*3/uL (ref 0.0–0.1)
EOS PCT: 1 %
Eosinophils Absolute: 0.1 10*3/uL (ref 0.0–0.7)
HEMATOCRIT: 31.4 % — AB (ref 36.0–46.0)
Hemoglobin: 9.8 g/dL — ABNORMAL LOW (ref 12.0–15.0)
Lymphocytes Relative: 32 %
Lymphs Abs: 3.6 10*3/uL (ref 0.7–4.0)
MCH: 26.8 pg (ref 26.0–34.0)
MCHC: 31.2 g/dL (ref 30.0–36.0)
MCV: 85.8 fL (ref 78.0–100.0)
MONO ABS: 0.4 10*3/uL (ref 0.1–1.0)
Monocytes Relative: 4 %
NEUTROS ABS: 6.8 10*3/uL (ref 1.7–7.7)
Neutrophils Relative %: 62 %
PLATELETS: 199 10*3/uL (ref 150–400)
RBC: 3.66 MIL/uL — ABNORMAL LOW (ref 3.87–5.11)
RDW: 15.4 % (ref 11.5–15.5)
WBC: 11 10*3/uL — ABNORMAL HIGH (ref 4.0–10.5)

## 2017-07-12 LAB — BRAIN NATRIURETIC PEPTIDE: B Natriuretic Peptide: 174.8 pg/mL — ABNORMAL HIGH (ref 0.0–100.0)

## 2017-07-12 MED ORDER — ACETAMINOPHEN 500 MG PO TABS
1000.0000 mg | ORAL_TABLET | Freq: Once | ORAL | Status: AC
  Administered 2017-07-12: 1000 mg via ORAL
  Filled 2017-07-12: qty 2

## 2017-07-12 MED ORDER — ACETAMINOPHEN 500 MG PO TABS: 1000.0000 mg | ORAL_TABLET | Freq: Three times a day (TID) | ORAL | 0 refills | Status: AC | PRN

## 2017-07-12 NOTE — ED Provider Notes (Signed)
Patient placed in Quick Look pathway, seen and evaluated   Chief Complaint: Leg swelling  HPI:   Patient complains of bilateral lower extremity swelling.  Recent medication changes from lasix to HCTZ.  Also states she feels off balance x 2 weeks.  Has to use walker or brace herself against the wall to walk.   ROS: leg swelling/off balance  Physical Exam:   Gen: No distress  Neuro: Awake and Alert  Skin: Warm    Focused Exam: Mild bilateral LE edema, lungs CTAB    Initiation of care has begun. The patient has been counseled on the process, plan, and necessity for staying for the completion/evaluation, and the remainder of the medical screening examination    Montine Circle, Hershal Coria 07/12/17 [place], MD 07/12/17 [phone]

## 2017-07-12 NOTE — Telephone Encounter (Signed)
Rec'd call from [facility] informing that patient is noncompliant with MAP pick ups for [place] and Trulicity. Representative reports that patient was contacted and she cited ongoing lower extremity edema as the reason she has been unable to pick up the medications. Informed MAP Representative that she is not on insulin at present but remains on Trulicity.  Carlean Jews, Pharm.D., BCPS PGY2 Ambulatory Care Pharmacy Resident Phone: [phone]

## 2017-07-12 NOTE — ED Triage Notes (Signed)
Pt endorses bilateral lower leg/ankle swelling x 1 month. Pt being following by her pcp for this problem. Pt was placed on lasix and then taken off and placed on hydrochlorithiazide. No hx of CHF, denies shob.

## 2017-07-12 NOTE — ED Provider Notes (Signed)
[facility] EMERGENCY DEPARTMENT Provider Note   CSN: [id number] Arrival date & time: 07/12/17  1352     History   Chief Complaint Chief Complaint  Patient presents with  . Leg Swelling    HPI Andrea Glenn is a 57 y.o. female.  57 year old female with past medical history including hypertension, type 2 diabetes mellitus, CKD, hyperlipidemia, tobacco use who presents with bilateral leg swelling.  Patient reports 2 months of persistent bilateral lower extremity edema.  She has been following with her PCP for this problem and was initially on Lasix and hydrochlorothiazide, more recently taken off the Lasix due to her kidney function.  She has been continued on the hydrochlorothiazide but has not taken it yesterday or today.  She reports ongoing bilateral foot pain and difficulties at work.  She is not able to tolerate compression stockings.  She does watch her salt intake.  No fevers or recent illness. No SOB.  The history is provided by the patient.    Past Medical History:  Diagnosis Date  . Diabetes mellitus without complication ([place])   . Hypertension     Patient Active Problem List   Diagnosis Date Noted  . CKD (chronic kidney disease) 07/08/2017  . Anemia of chronic disease 06/21/2017  . Primary hypertension 02/03/2017  . Hematuria 01/20/2017  . Dysuria 01/20/2017  . Lower extremity edema 12/30/2016  . Pancreatitis 10/08/2016  . Hyperlipidemia associated with type 2 diabetes mellitus ([place]) 10/26/2013  . Restless leg syndrome 03/03/2010  . Uncontrolled type 2 diabetes mellitus with peripheral neuropathy ([place]) 06/16/2006  . Tobacco use disorder 06/16/2006    Past Surgical History:  Procedure Laterality Date  . BACK SURGERY    . CESAREAN SECTION    . FOOT SURGERY    . SHOULDER SURGERY       OB History   None      Home Medications    Prior to Admission medications   Medication Sig Start Date End Date Taking? Authorizing Provider    acetaminophen (TYLENOL) 500 MG tablet Take 2 tablets (1,000 mg total) by mouth every 8 (eight) hours as needed for up to 5 days. 07/12/17 07/17/17  Dorothymae Maciver, Wenda Overland, MD  Dulaglutide (TRULICITY) 6.73 AL/9.3XT SOPN Inject 0.75 mg into the skin once a week. 05/19/17   Zenia Resides, MD  gabapentin (NEURONTIN) 300 MG capsule Take 1 capsule (300 mg total) by mouth 3 (three) times daily. 06/21/17   Henrietta Bing, DO  hydrochlorothiazide (HYDRODIURIL) 12.5 MG tablet Take 1 tablet (12.5 mg total) by mouth daily. 06/28/17   [place] Bing, DO  nicotine (NICODERM CQ - DOSED IN MG/24 HR) 7 mg/24hr patch Place 1 patch (7 mg total) onto the skin daily. 05/19/17   Zenia Resides, MD    Family History Family History  Problem Relation Age of Onset  . Kidney failure Mother   . Diabetes Mother   . Thyroid disease Mother   . Hypertension Mother   . Heart failure Mother   . Kidney failure Father   . Cancer Sister   . Stroke Brother   . Cancer Other     Social History Social History   Tobacco Use  . Smoking status: Current Every Day Smoker    Packs/day: 0.10    Years: 20.00    Pack years: 2.00    Types: Cigarettes    Start date: 10  . Smokeless tobacco: Never Used  . Tobacco comment: working on quitting - 2  per day - 2.26.19  Substance Use Topics  . Alcohol use: No  . Drug use: No     Allergies   Atorvastatin; Amitriptyline; and Metformin and related   Review of Systems Review of Systems All other systems reviewed and are negative except that which was mentioned in HPI   Physical Exam Updated Vital Signs BP (!) 171/78 (BP Location: Right Arm)   Pulse 86   Temp 98 F (36.7 C) (Oral)   Resp 16   Ht 5\' 4"  (1.626 m)   Wt 57.2 kg (126 lb)   SpO2 100%   BMI 21.63 kg/m   Physical Exam  Constitutional: She is oriented to person, place, and time. She appears well-developed and well-nourished. No distress.  HENT:  Head: Normocephalic and atraumatic.  Moist mucous  membranes  Eyes: Pupils are equal, round, and reactive to light. Conjunctivae are normal.  Neck: Neck supple.  Cardiovascular: Normal rate, regular rhythm and normal heart sounds.  No murmur heard. Pulmonary/Chest: Effort normal and breath sounds normal.  Abdominal: Soft. Bowel sounds are normal. She exhibits no distension. There is no tenderness.  Musculoskeletal: She exhibits edema (1+ pitting BLE L>R).  Neurological: She is alert and oriented to person, place, and time.  Fluent speech  Skin: Skin is warm and dry. No erythema.  Psychiatric: She has a normal mood and affect.  Nursing note and vitals reviewed.    ED Treatments / Results  Labs (all labs ordered are listed, but only abnormal results are displayed) Labs Reviewed  CBC WITH DIFFERENTIAL/PLATELET - Abnormal; Notable for the following components:      Result Value   WBC 11.0 (*)    RBC 3.66 (*)    Hemoglobin 9.8 (*)    HCT 31.4 (*)    All other components within normal limits  BASIC METABOLIC PANEL - Abnormal; Notable for the following components:   Glucose, Bld 222 (*)    BUN 29 (*)    Creatinine, Ser 1.46 (*)    GFR calc non Af Amer 39 (*)    GFR calc Af Amer 45 (*)    All other components within normal limits  BRAIN NATRIURETIC PEPTIDE - Abnormal; Notable for the following components:   B Natriuretic Peptide 174.8 (*)    All other components within normal limits    EKG EKG Interpretation  Date/Time:  Tuesday July 12 2017 20:33:26 EDT Ventricular Rate:  86 PR Interval:  160 QRS Duration: 72 QT Interval:  376 QTC Calculation: 449 R Axis:   77 Text Interpretation:  Normal sinus rhythm Normal ECG EKG WITHIN NORMAL LIMITS Confirmed by Theotis Burrow [phone]) on 07/12/2017 8:38:32 PM   Radiology Dg Chest 2 View  Result Date: 07/12/2017 CLINICAL DATA:  Shortness of breath with lower extremity edema EXAM: CHEST - 2 VIEW COMPARISON:  None. FINDINGS: There is no edema or consolidation. Heart size and pulmonary  vascularity are normal. No adenopathy. No bone lesions. There is slight upper thoracic leftward deviation of the trachea. IMPRESSION: No edema or consolidation. Slight upper thoracic leftward deviation of the trachea. Question localized thyroid enlargement in this area. Electronically Signed   By: Lowella Grip III M.D.   On: 07/12/2017 16:27    Procedures Procedures (including critical care time)  Medications Ordered in ED Medications  acetaminophen (TYLENOL) tablet 1,000 mg (has no administration in time range)     Initial Impression / Assessment and Plan / ED Course  I have reviewed the triage vital signs and the  nursing notes.  Pertinent labs & imaging results that were available during my care of the patient were reviewed by me and considered in my medical decision making (see chart for details).     Pt hypertensive but remainder of VS reassuring. Clear breath sounds and reassuring CXR. Cr is 1.46 today, similar to last 2 values. I reviewed chart which shows extensive outpatient work up for this chronic problem including outpatient ECHO and other labwork. Given chronicity and worsening at the end of the day, I doubt acute bilateral DVT. I have discussed supportive measures and instructed to f/u with PCP to have referral to nephrology for her CKD and ongoing management of her chronic swelling.  Final Clinical Impressions(s) / ED Diagnoses   Final diagnoses:  Bilateral lower extremity edema    ED Discharge Orders        Ordered    acetaminophen (TYLENOL) 500 MG tablet  Every 8 hours PRN     07/12/17 2052       Malarie Tappen, Wenda Overland, MD 07/12/17 2135

## 2017-07-14 ENCOUNTER — Encounter: Payer: Self-pay | Admitting: Pharmacist

## 2017-07-14 ENCOUNTER — Ambulatory Visit (INDEPENDENT_AMBULATORY_CARE_PROVIDER_SITE_OTHER): Payer: Self-pay | Admitting: Pharmacist

## 2017-07-14 DIAGNOSIS — E1142 Type 2 diabetes mellitus with diabetic polyneuropathy: Secondary | ICD-10-CM

## 2017-07-14 DIAGNOSIS — I10 Essential (primary) hypertension: Secondary | ICD-10-CM

## 2017-07-14 DIAGNOSIS — F17200 Nicotine dependence, unspecified, uncomplicated: Secondary | ICD-10-CM

## 2017-07-14 DIAGNOSIS — IMO0002 Reserved for concepts with insufficient information to code with codable children: Secondary | ICD-10-CM

## 2017-07-14 DIAGNOSIS — E1165 Type 2 diabetes mellitus with hyperglycemia: Secondary | ICD-10-CM

## 2017-07-14 MED ORDER — GABAPENTIN 300 MG PO CAPS: ORAL_CAPSULE | ORAL | 3 refills | Status: DC

## 2017-07-14 NOTE — Progress Notes (Signed)
Patient ID: Andrea Glenn, female   DOB: 15-Nov-1960, 57 y.o.   MRN: [id number] Reviewed: Agree with Dr. Graylin Shiver documentation and management.

## 2017-07-14 NOTE — Assessment & Plan Note (Addendum)
Diabetes longstanding currently very well controlled as evidenced by A1C <7 and CBG readings. Patient denies hypoglycemic events and is able to verbalize appropriate hypoglycemia management plan. Patient reports adherence with medication.  -Continue Trulicity 5.18 mg subcut once weekly. Provided medication picked up from [facility] (1 month supply).  -Next A1C anticipated 09/21/17 or later  Neuropathy - discussed case with Dr. Andria Frames. Please see accompanying note. Suspect this may be from "treatment induced neuropathy of diabetes" due to abrupt improvement of glycemic control (A1C reduction from >15.5 on 09/2016 to 6.7 on 03/2017). This phenomenon is also described as being accompanied by microvascular complications such as retinopathy and microalbuminuria Donney Rankins, [facility]. Curr Diab Rep. [id number];25:189). Consider hyperthyroid workup in future given reduced TSH and findings on CXR. -Increased gabapentin to 300-600 mg in morning and midday, 600 mg qHS. Max dose ~1400 mg/day suggested with patient's renal function.   -Anticipate this to improve over months to years, unfortunately, if this is from treatment induced neuropathy of diabetes

## 2017-07-14 NOTE — Progress Notes (Signed)
S:     Chief Complaint  Patient presents with  . Medication Management    DM    Patient arrives in good spirits, ambulating without assistance but with obvious limp.  Presents for diabetes evaluation, education, and management at the request of Dr. Yisroel Ramming.  Patient was last seen by Primary Care Provider on 07/08/17. Of note, went to ED for edema, blurred vision, and impaired balance 07/12/17 - no changes to medications at that time.   Today, patient continues to complain of edema in feet bilaterally (R>L), slightly improved. Edema associated with significant pain at rest, upon contact and while walking. Reports gabapentin made her sleepy at first but she is tolerating 300 mg TID at this time. Complains of "gritty" feeling in eyes. Reports her CBGs continue to be controlled which she attributes to her diet. Reports she has worked hard to increase protein and vegetables in diet. Denies SOB while walking on the flat, endorses while climbing stairs. Continues to smoke ~ 3 cigarettes/day at present. Sister wants her to quit as she is the only one family that smokes. She wants to quit but reports stress with health issues.   Patient reports adherence with medications.  Current diabetes medications include: Trulicity 7.61 mg subcut once weekly - approved for medication via [place] [facility].  Current hypertension medications include: HCTZ 12.5 mg daily  Patient denies hypoglycemic events.  Patient reported dietary habits: has been "salading out", drinking carnation breakfast shakes and eats a protein bar. Has been trying to increase protein and vegetables.   O:  Physical Exam  Constitutional: She appears well-developed and well-nourished.     Review of Systems  Cardiovascular: Positive for leg swelling (bilaterally, feet, R>L).  Neurological: Negative for dizziness, speech change and weakness.     Lab Results  Component Value Date   HGBA1C 6.6 06/21/2017   Vitals:   07/14/17 1522    BP: 120/70  Pulse: 86  SpO2: 96%    Home fasting CBG: 80s-low 100s, at highest 130s.   TSH = 0.373  (06/23/2017)  CrCl = 36 ml/min CXR taken in ED (07/12/17) FINDINGS: There is no edema or consolidation. Heart size and pulmonary vascularity are normal. No adenopathy. No bone lesions. There is slight upper thoracic leftward deviation of the trachea.  IMPRESSION: No edema or consolidation. Slight upper thoracic leftward deviation of the trachea. Question localized thyroid enlargement in this area  A/P: Diabetes longstanding currently very well controlled as evidenced by A1C <7 and CBG readings. Patient denies hypoglycemic events and is able to verbalize appropriate hypoglycemia management plan. Patient reports adherence with medication.  -Continue Trulicity 6.07 mg subcut once weekly. Provided medication picked up from [place] (1 month supply).  -Next A1C anticipated 09/21/17 or later  Neuropathy - discussed case with Dr. Andria Frames. Please see accompanying note. Suspect this may be from "treatment induced neuropathy of diabetes" due to abrupt improvement of glycemic control (A1C reduction from >15.5 on 09/2016 to 6.7 on 03/2017). This phenomenon is also described as being accompanied by microvascular complications such as retinopathy and microalbuminuria Donney Rankins, [facility]. Curr Diab Rep. [id number];62:694). Consider hyperthyroid workup in future given reduced TSH and findings on CXR. -Increased gabapentin to 300-600 mg in morning and midday, 600 mg qHS. Max dose ~1400 mg/day suggested with patient's renal function.   -Anticipate this to improve over months to years, unfortunately, if this is from treatment induced neuropathy of diabetes  Hypertension longstanding currently well controlled in office.  Patient reports  adherence with medication. Control improved with improved dietary habits and medication. Suspect fluid accumulation has contributed to uncontrolled readings in past. Continue HCTZ 12.5  mg daily.   Tobacco use disorder - continues to smoke ~3 cigarettes/day. Success limited by increased stressors with health issues. Patient appears motivated to quit. Missed previously set quit date. -Encouraged reevaluation of goals and cessation as soon as possible -Follow up at future visits  Written patient instructions provided.  Total time in face to face counseling 30 minutes.   Follow up in clinic with primary care provider (appt with Dr. Enid Derry already made for 07/22/17.   Patient seen with Hildred Alamin, PharmD Candidate and Jalene Mullet, PharmD, PGY1 Resident and Deirdre Pippins, PGY2 Pharmacy Resident, PharmD, BCPS.

## 2017-07-14 NOTE — Patient Instructions (Addendum)
Good to see you today.   1. We want to increase your gabapentin. Increase to 600 mg (2 capsules) three times a day. If you are getting too sleepy, decrease to 300 mg during the day   2. We gave you your shipment of Trulicity that we picked up from MAP. Continue this medication once weekly.   3. Try getting over the counter "Artificial Tears" and use every times daily in eyes to reduce dry eyes. There are lots of different brands you can try. Look for "wetting drops". Replense is one brand you can dry.   Come and see your doctor on 07/22/17.

## 2017-07-14 NOTE — Assessment & Plan Note (Signed)
Hypertension longstanding currently well controlled in office.  Patient reports adherence with medication. Control improved with improved dietary habits and medication. Suspect fluid accumulation has contributed to uncontrolled readings in past. Continue HCTZ 12.5 mg daily.

## 2017-07-14 NOTE — Assessment & Plan Note (Signed)
Tobacco use disorder - continues to smoke ~3 cigarettes/day. Success limited by increased stressors with health issues. Patient appears motivated to quit. Missed previously set quit date. -Encouraged reevaluation of goals and cessation as soon as possible -Follow up at future visits

## 2017-07-15 ENCOUNTER — Telehealth: Payer: Self-pay

## 2017-07-15 NOTE — Telephone Encounter (Signed)
Pt calling states after increasing her gabapentin per Dr. Graylin Shiver appointment yesterday, she started experiencing externe shaking "like she's having a seizure" and vomiting. Consulted with Dr. Mingo Amber and his recommendation is to stop gabapentin for today, and restart tomorrow at her regular dose- 1 TID. He also recommends she come in next week for labs- he noted decreased GFR when reviewing patients chart. PT has appointment with Dr. Yisroel Ramming next week. Will forward to him for review and well as Dr. Valentina Lucks. Wallace Cullens, RN

## 2017-07-18 ENCOUNTER — Other Ambulatory Visit: Payer: Self-pay

## 2017-07-18 ENCOUNTER — Emergency Department (HOSPITAL_COMMUNITY): Payer: Self-pay

## 2017-07-18 ENCOUNTER — Encounter (HOSPITAL_COMMUNITY): Payer: Self-pay | Admitting: *Deleted

## 2017-07-18 ENCOUNTER — Emergency Department (HOSPITAL_COMMUNITY)
Admission: EM | Admit: 2017-07-18 | Discharge: 2017-07-19 | Disposition: A | Discharge status: Home / Self Care | Payer: Self-pay | Attending: Physician Assistant | Admitting: Physician Assistant

## 2017-07-18 DIAGNOSIS — Z79899 Other long term (current) drug therapy: Secondary | ICD-10-CM | POA: Insufficient documentation

## 2017-07-18 DIAGNOSIS — N3001 Acute cystitis with hematuria: Secondary | ICD-10-CM | POA: Insufficient documentation

## 2017-07-18 DIAGNOSIS — I129 Hypertensive chronic kidney disease with stage 1 through stage 4 chronic kidney disease, or unspecified chronic kidney disease: Secondary | ICD-10-CM | POA: Insufficient documentation

## 2017-07-18 DIAGNOSIS — N189 Chronic kidney disease, unspecified: Secondary | ICD-10-CM | POA: Insufficient documentation

## 2017-07-18 DIAGNOSIS — E1122 Type 2 diabetes mellitus with diabetic chronic kidney disease: Secondary | ICD-10-CM | POA: Insufficient documentation

## 2017-07-18 DIAGNOSIS — F17210 Nicotine dependence, cigarettes, uncomplicated: Secondary | ICD-10-CM | POA: Insufficient documentation

## 2017-07-18 HISTORY — DX: Localized edema: R60.0

## 2017-07-18 HISTORY — DX: Polyneuropathy, unspecified: G62.9

## 2017-07-18 LAB — URINALYSIS, ROUTINE W REFLEX MICROSCOPIC
BILIRUBIN URINE: NEGATIVE
Glucose, UA: >=500 mg/dL — AB
Ketones, ur: NEGATIVE mg/dL
Nitrite: NEGATIVE
Specific Gravity, Urine: 1.02 (ref 1.005–1.030)
pH: 5 (ref 5.0–8.0)

## 2017-07-18 LAB — CBC
HEMATOCRIT: 33.1 % — AB (ref 36.0–46.0)
Hemoglobin: 10.4 g/dL — ABNORMAL LOW (ref 12.0–15.0)
MCH: 27.2 pg (ref 26.0–34.0)
MCHC: 31.4 g/dL (ref 30.0–36.0)
MCV: 86.6 fL (ref 78.0–100.0)
Platelets: 253 10*3/uL (ref 150–400)
RBC: 3.82 MIL/uL — ABNORMAL LOW (ref 3.87–5.11)
RDW: 14.7 % (ref 11.5–15.5)
WBC: 10.2 10*3/uL (ref 4.0–10.5)

## 2017-07-18 LAB — COMPREHENSIVE METABOLIC PANEL
ALBUMIN: 2.8 g/dL — AB (ref 3.5–5.0)
ALT: 8 U/L — ABNORMAL LOW (ref 14–54)
ANION GAP: 10 (ref 5–15)
AST: 16 U/L (ref 15–41)
Alkaline Phosphatase: 121 U/L (ref 38–126)
BUN: 28 mg/dL — ABNORMAL HIGH (ref 6–20)
CHLORIDE: 101 mmol/L (ref 101–111)
CO2: 25 mmol/L (ref 22–32)
Calcium: 9.3 mg/dL (ref 8.9–10.3)
Creatinine, Ser: 1.6 mg/dL — ABNORMAL HIGH (ref 0.44–1.00)
GFR calc non Af Amer: 34 mL/min — ABNORMAL LOW (ref >60)
GFR, EST AFRICAN AMERICAN: 40 mL/min — AB (ref >60)
Glucose, Bld: 342 mg/dL — ABNORMAL HIGH (ref 65–99)
Potassium: 3.7 mmol/L (ref 3.5–5.1)
SODIUM: 136 mmol/L (ref 135–145)
Total Bilirubin: 0.6 mg/dL (ref 0.3–1.2)
Total Protein: 6.9 g/dL (ref 6.5–8.1)

## 2017-07-18 MED ORDER — CEPHALEXIN 250 MG PO CAPS
500.0000 mg | ORAL_CAPSULE | Freq: Once | ORAL | Status: AC
  Administered 2017-07-18: 500 mg via ORAL
  Filled 2017-07-18: qty 2

## 2017-07-18 MED ORDER — CEPHALEXIN 500 MG PO CAPS: 500.0000 mg | ORAL_CAPSULE | Freq: Four times a day (QID) | ORAL | 0 refills | Status: DC

## 2017-07-18 NOTE — Discharge Instructions (Signed)
We found you to have a urinary tract infection today.  Please take the antibiotics as prescribed.  We noticed that your blood sugars were a little bit higher than usual.  I think is important that you call your primary care and your pharmacist that you discuss things with in the morning.  It may be that you require a second medication and you will need to  measure your blood sugars at home.

## 2017-07-18 NOTE — ED Notes (Signed)
Attempted to interview patient about her reason for visit, more concerned with charging her cellphone.

## 2017-07-18 NOTE — ED Triage Notes (Addendum)
Pt here via [facility] for falling every time she gets up x 3 weeks.  States pain to L ribs from fall.  cbg 399, 140/78, hr 20, 16 rr, 99 RA.  Per EMS, pt shakes only when being watched.  Pt was able to ambulated down driveway to ambulance without difficulty, per [facility].  Pt states she called EMS b/c she fell and was unable to get up for 1 hour. AO x 4.

## 2017-07-18 NOTE — ED Notes (Signed)
Patient observed leaning out of reclining chair to retrieve purse and remove cell phone.  Currently on phone.  Patient moved without difficulty.

## 2017-07-18 NOTE — ED Provider Notes (Addendum)
[facility] EMERGENCY DEPARTMENT Provider Note   CSN: [id number] Arrival date & time: 07/18/17  1625     History   Chief Complaint Chief Complaint  Patient presents with  . Fall    HPI Andrea Glenn is a 57 y.o. female.  HPI   Patient is a 57 year old female presenting with a compilation of many different symptoms.  It is difficult to assess that patient's primary complaint is today.  She states that she sometimes feels unsteady.  Sometimes has lower extremity swelling.  She reports that this is been going on for quite some time.  1 week got into in-depth conversation, it sounds like is been happening since February.  Patient states that occasionally when she leans over than stand up she will fall out.  Today patient had some type of fall, she does not remember what, or how long it was.  Patient also has volitional shaking of bilateral extremities.  When patient is discussing something intensely she will not shake.  However when she is talking about shaking, or when she is asked to do something, she will occasionally do a coarse tremor bilaterally upper extremity.  She says "see I am shaking".  But then will move on to talk about other things, the shaking will stop.  Patient had almost daily check ins with her primary care and her pharmacist this month.  Patient reports she is been unable to work since February due to all of the symptoms.    Past Medical History:  Diagnosis Date  . Diabetes mellitus without complication ([place])   . Edema of both legs   . Hypertension   . Neuropathy     Patient Active Problem List   Diagnosis Date Noted  . CKD (chronic kidney disease) 07/08/2017  . Anemia of chronic disease 06/21/2017  . Primary hypertension 02/03/2017  . Hematuria 01/20/2017  . Dysuria 01/20/2017  . Lower extremity edema 12/30/2016  . Pancreatitis 10/08/2016  . Hyperlipidemia associated with type 2 diabetes mellitus ([place]) 10/26/2013  . Restless leg  syndrome 03/03/2010  . Uncontrolled type 2 diabetes mellitus with peripheral neuropathy ([place]) 06/16/2006  . Tobacco use disorder 06/16/2006    Past Surgical History:  Procedure Laterality Date  . BACK SURGERY    . CESAREAN SECTION    . FOOT SURGERY    . SHOULDER SURGERY       OB History   None      Home Medications    Prior to Admission medications   Medication Sig Start Date End Date Taking? Authorizing Provider  acetaminophen (TYLENOL) 500 MG tablet Take 1,000 mg by mouth every 6 (six) hours as needed for headache (pain).   Yes [provider]  Dulaglutide (TRULICITY) 0.01 VC/9.4WH SOPN Inject 0.75 mg into the skin once a week. Patient taking differently: Inject 0.75 mg into the skin every Tuesday.  05/19/17  Yes Hensel, Jamal Collin, MD  OVER THE COUNTER MEDICATION Place 1 drop into both eyes 4 (four) times daily as needed (dry eyes/irritation). Over the counter lubricating eye drop   Yes [provider]  gabapentin (NEURONTIN) 300 MG capsule Take 600 mg by mouth three times daily Patient taking differently: Take 600 mg by mouth 3 (three) times daily.  07/14/17   Zenia Resides, MD  hydrochlorothiazide (HYDRODIURIL) 12.5 MG tablet Take 1 tablet (12.5 mg total) by mouth daily. 06/28/17   Weston Bing, DO  nicotine (NICODERM CQ - DOSED IN MG/24 HR) 7 mg/24hr patch Place  1 patch (7 mg total) onto the skin daily. Patient not taking: Reported on 07/18/2017 05/19/17   Zenia Resides, MD  TURMERIC PO Take 1 capsule by mouth 2 (two) times daily.    [provider]    Family History Family History  Problem Relation Age of Onset  . Kidney failure Mother   . Diabetes Mother   . Thyroid disease Mother   . Hypertension Mother   . Heart failure Mother   . Kidney failure Father   . Cancer Sister   . Stroke Brother   . Cancer Other     Social History Social History   Tobacco Use  . Smoking status: Current Every Day Smoker    Packs/day: 0.10     Years: 20.00    Pack years: 2.00    Types: Cigarettes    Start date: 38  . Smokeless tobacco: Never Used  . Tobacco comment: working on quitting - 2 per day - 2.26.19  Substance Use Topics  . Alcohol use: No  . Drug use: No     Allergies   Atorvastatin; Amitriptyline; and Metformin and related   Review of Systems Review of Systems  Constitutional: Positive for fatigue.  Respiratory: Positive for cough.   Cardiovascular: Positive for leg swelling.  Gastrointestinal: Positive for abdominal pain.  Genitourinary: Positive for dysuria.  Neurological: Positive for dizziness, tremors and weakness.     Physical Exam Updated Vital Signs BP (!) 129/59 (BP Location: Right Arm)   Pulse 85   Temp 98 F (36.7 C) (Oral)   Resp 14   SpO2 100%   Physical Exam  Constitutional: She is oriented to person, place, and time. She appears well-developed and well-nourished.  HENT:  Head: Normocephalic and atraumatic.  Eyes: Right eye exhibits no discharge. Left eye exhibits no discharge.  Cardiovascular: Normal rate, regular rhythm and normal heart sounds.  No murmur heard. Pulmonary/Chest: Effort normal and breath sounds normal. She has no wheezes. She has no rales.  Abdominal: Soft. She exhibits no distension. There is no tenderness.  Musculoskeletal:  Mild edema L>R  Neurological: She is oriented to person, place, and time.  Patient has abnormal colored contacts in.  Finger to nose unable to be assessed because of patient's "tremor", but appears normally able to access objects in front of her, no dizziness when moving around. Spry movements in bed  Smile is equal tongue is midline patient appears to be moving normally in the bed when not under direct assessment.  Skin: Skin is warm and dry. She is not diaphoretic.  Psychiatric:  Labile mood.  Nursing note and vitals reviewed.    ED Treatments / Results  Labs (all labs ordered are listed, but only abnormal results are  displayed) Labs Reviewed  CBC - Abnormal; Notable for the following components:      Result Value   RBC 3.82 (*)    Hemoglobin 10.4 (*)    HCT 33.1 (*)    All other components within normal limits  URINALYSIS, ROUTINE W REFLEX MICROSCOPIC - Abnormal; Notable for the following components:   Color, Urine AMBER (*)    APPearance CLOUDY (*)    Glucose, UA >=500 (*)    Hgb urine dipstick LARGE (*)    Protein, ur >=300 (*)    Leukocytes, UA SMALL (*)    Bacteria, UA MANY (*)    Squamous Epithelial / LPF 0-5 (*)    All other components within normal limits  COMPREHENSIVE METABOLIC PANEL -  Abnormal; Notable for the following components:   Glucose, Bld 342 (*)    BUN 28 (*)    Creatinine, Ser 1.60 (*)    Albumin 2.8 (*)    ALT 8 (*)    GFR calc non Af Amer 34 (*)    GFR calc Af Amer 40 (*)    All other components within normal limits  URINE CULTURE    EKG EKG Interpretation  Date/Time:  Monday July 18 2017 16:45:31 EDT Ventricular Rate:  86 PR Interval:  152 QRS Duration: 70 QT Interval:  396 QTC Calculation: 473 R Axis:   81 Text Interpretation:  Normal sinus rhythm Normal ECG Normal sinus rhythm Confirmed by Zenovia Jarred [phone]) on 07/18/2017 9:58:55 PM   Radiology Dg Chest 2 View  Result Date: 07/18/2017 CLINICAL DATA:  57 year old female with chest pain. EXAM: CHEST - 2 VIEW COMPARISON:  Chest radiograph dated 07/12/2017 FINDINGS: The lungs are clear. There is no pleural effusion or pneumothorax. The cardiac silhouette is within normal limits. Stable slight leftward deviation of the upper thoracic trachea may be related to a thyroid nodule or enlargement of the right thyroid gland. Degenerative changes of the shoulders. No acute osseous pathology. IMPRESSION: No active cardiopulmonary disease. Electronically Signed   By: Anner Crete M.D.   On: 07/18/2017 23:19    Procedures Procedures (including critical care time)  Medications Ordered in ED Medications    cephALEXin (KEFLEX) capsule 500 mg (has no administration in time range)     Initial Impression / Assessment and Plan / ED Course  I have reviewed the triage vital signs and the nursing notes.  Pertinent labs & imaging results that were available during my care of the patient were reviewed by me and considered in my medical decision making (see chart for details).     Patient is a 57 year old female presenting with a compilation of many different symptoms.  It is difficult to assess that patient's primary complaint is today.  She states that she sometimes feels unsteady.  Sometimes has lower extremity swelling.  She reports that this is been going on for quite some time.  1 week got into in-depth conversation, it sounds like is been happening since February.  Patient states that occasionally when she leans over than stand up she will fall out.  Today patient had some type of fall, she does not remember what, or how long it was.  Patient also has volitional shaking of bilateral extremities.  When patient is discussing something intensely she will not shake.  However when she is talking about shaking, or when she is asked to do something, she will occasionally do a coarse tremor bilaterally upper extremity.  She says "see I am shaking".  But then will move on to talk about other things, the shaking will stop.  Patient had almost daily check ins with her primary care and her pharmacist this month.  Patient reports she is been unable to work since February due to all of the symptoms.  10:35 PM It is difficult to assess patient because she has so many different complaints.  Patient's pan positive on symptoms, and her exam changes throughout my visit with her.  Patient's vital signs are reassuring.  Her labs are also reassuring.  Creatinine mildly has been increasing over the last month.  We will continue to have her follow-up with her primary care physician and pharmacist who she has daily  interactions with.  It really appears that the most the symptoms  have been going on for greater than 2 months.  Patient is eating and drinking normally here, texting comfortably.    11:19 PM Patient drank entire ginger ale.  Was able to abulate to the bathroom.  Nurse reports that halfway through ambulation she bent at the waist leaning over touching her toes saying "I am about to fall over".  But then was able to continue walking.  Patient has been recently increasing her gabapentin.  We recommended that she decrease her dose back to her normal dose of gabapentin.  (She was already planning to)   11:49 PM   Patient's blood sugars have been elevated for the last week (looking at past labs).  Patient is found to have a urinary tract infection today.  The two are likely related.  She takes weekly Trulicity.  It may be that she needs a second agent.  Patient's deeply involved with Dr. Valentina Lucks, pharmacy.  Will start on antibiotics for the urinary tract infection.  We will have her call her primary care office tomorrow morning.  Patient ate once sandwhcih, and a Diet Coke.  And ask for a second sandwhich .  Discussed return precautions.  Patient will follow up with her primary care in the morning.  Patient is eating drinking normal vital signs and ambulatory at the time of discharge      Final Clinical Impressions(s) / ED Diagnoses   Final diagnoses:  None    ED Discharge Orders    None       Macarthur Critchley, MD 07/18/17 2351    Macarthur Critchley, MD 07/18/17 2359

## 2017-07-18 NOTE — ED Notes (Signed)
Patient shaking arms, when I asked patient to keep arm still for blood pressure, patient was able to control movement without difficulty.

## 2017-07-18 NOTE — ED Notes (Signed)
ED Provider at bedside. 

## 2017-07-18 NOTE — Telephone Encounter (Signed)
Noted and agree. 

## 2017-07-18 NOTE — Telephone Encounter (Signed)
Contacted in follow-up of the report of worsening symptoms following increase in Gabapentin dosing.  Patient reported stumbling and difficulty moving with higher dose of gabapentin.  She stopped this medication completely.  She states her pain is ~ 3/10 with use of tylenol - she is unsure of the dose.   Discussed using up to 3000mg  daily total as a max dose.  She is comfortable with her pain management plan currently.   She states her leg edema is improved.  She plans to see Dr. Yisroel Ramming later in the week.     Note: If a change in neuropathy pain regimen is made later this week.  Consideration for change to duloxetine or pregabalin could be reasonable options.

## 2017-07-18 NOTE — ED Notes (Signed)
Assisted patient into chair in hallway.  Patient slumped over in wheelchair.  Sternal rub applied and patient sat up straight.  Patient appears to be able to control movement.

## 2017-07-18 NOTE — ED Notes (Signed)
Pt ambulatory to bathroom. Intermittent shaking of upper extremities but stops when holding hands of staff. While walking she bent over at the waist and said she was going to fall.

## 2017-07-22 ENCOUNTER — Encounter (HOSPITAL_COMMUNITY): Payer: Self-pay | Admitting: *Deleted

## 2017-07-22 ENCOUNTER — Telehealth: Payer: Self-pay | Admitting: Family Medicine

## 2017-07-22 ENCOUNTER — Emergency Department (HOSPITAL_COMMUNITY): Payer: Self-pay

## 2017-07-22 ENCOUNTER — Inpatient Hospital Stay (HOSPITAL_COMMUNITY)
Admission: EM | Admit: 2017-07-22 | Discharge: 2017-07-25 | DRG: 683 | Disposition: A | Discharge status: Home / Self Care | Payer: Self-pay | Attending: Family Medicine | Admitting: Family Medicine

## 2017-07-22 ENCOUNTER — Ambulatory Visit: Payer: Medicaid Other | Admitting: Family Medicine

## 2017-07-22 DIAGNOSIS — B9620 Unspecified Escherichia coli [E. coli] as the cause of diseases classified elsewhere: Secondary | ICD-10-CM | POA: Diagnosis present

## 2017-07-22 DIAGNOSIS — E1122 Type 2 diabetes mellitus with diabetic chronic kidney disease: Secondary | ICD-10-CM | POA: Diagnosis present

## 2017-07-22 DIAGNOSIS — M25579 Pain in unspecified ankle and joints of unspecified foot: Secondary | ICD-10-CM

## 2017-07-22 DIAGNOSIS — I952 Hypotension due to drugs: Secondary | ICD-10-CM | POA: Diagnosis present

## 2017-07-22 DIAGNOSIS — R112 Nausea with vomiting, unspecified: Secondary | ICD-10-CM

## 2017-07-22 DIAGNOSIS — R296 Repeated falls: Secondary | ICD-10-CM | POA: Diagnosis present

## 2017-07-22 DIAGNOSIS — I130 Hypertensive heart and chronic kidney disease with heart failure and stage 1 through stage 4 chronic kidney disease, or unspecified chronic kidney disease: Secondary | ICD-10-CM | POA: Diagnosis present

## 2017-07-22 DIAGNOSIS — N189 Chronic kidney disease, unspecified: Secondary | ICD-10-CM | POA: Diagnosis present

## 2017-07-22 DIAGNOSIS — N182 Chronic kidney disease, stage 2 (mild): Secondary | ICD-10-CM | POA: Diagnosis present

## 2017-07-22 DIAGNOSIS — T502X5A Adverse effect of carbonic-anhydrase inhibitors, benzothiadiazides and other diuretics, initial encounter: Secondary | ICD-10-CM | POA: Diagnosis present

## 2017-07-22 DIAGNOSIS — N390 Urinary tract infection, site not specified: Secondary | ICD-10-CM

## 2017-07-22 DIAGNOSIS — R55 Syncope and collapse: Secondary | ICD-10-CM | POA: Diagnosis present

## 2017-07-22 DIAGNOSIS — Z23 Encounter for immunization: Secondary | ICD-10-CM

## 2017-07-22 DIAGNOSIS — E1165 Type 2 diabetes mellitus with hyperglycemia: Secondary | ICD-10-CM | POA: Diagnosis present

## 2017-07-22 DIAGNOSIS — IMO0002 Reserved for concepts with insufficient information to code with codable children: Secondary | ICD-10-CM | POA: Diagnosis present

## 2017-07-22 DIAGNOSIS — M25572 Pain in left ankle and joints of left foot: Secondary | ICD-10-CM | POA: Diagnosis present

## 2017-07-22 DIAGNOSIS — M25472 Effusion, left ankle: Secondary | ICD-10-CM | POA: Diagnosis present

## 2017-07-22 DIAGNOSIS — F17210 Nicotine dependence, cigarettes, uncomplicated: Secondary | ICD-10-CM | POA: Diagnosis present

## 2017-07-22 DIAGNOSIS — I5030 Unspecified diastolic (congestive) heart failure: Secondary | ICD-10-CM | POA: Diagnosis present

## 2017-07-22 DIAGNOSIS — N184 Chronic kidney disease, stage 4 (severe): Secondary | ICD-10-CM | POA: Diagnosis present

## 2017-07-22 DIAGNOSIS — Z79899 Other long term (current) drug therapy: Secondary | ICD-10-CM

## 2017-07-22 DIAGNOSIS — N3001 Acute cystitis with hematuria: Secondary | ICD-10-CM | POA: Diagnosis present

## 2017-07-22 DIAGNOSIS — E1142 Type 2 diabetes mellitus with diabetic polyneuropathy: Secondary | ICD-10-CM | POA: Diagnosis present

## 2017-07-22 DIAGNOSIS — E1169 Type 2 diabetes mellitus with other specified complication: Secondary | ICD-10-CM | POA: Diagnosis present

## 2017-07-22 DIAGNOSIS — Z7984 Long term (current) use of oral hypoglycemic drugs: Secondary | ICD-10-CM

## 2017-07-22 DIAGNOSIS — N179 Acute kidney failure, unspecified: Principal | ICD-10-CM | POA: Diagnosis present

## 2017-07-22 DIAGNOSIS — Z888 Allergy status to other drugs, medicaments and biological substances status: Secondary | ICD-10-CM

## 2017-07-22 DIAGNOSIS — N183 Chronic kidney disease, stage 3 (moderate): Secondary | ICD-10-CM

## 2017-07-22 DIAGNOSIS — F444 Conversion disorder with motor symptom or deficit: Secondary | ICD-10-CM | POA: Diagnosis present

## 2017-07-22 DIAGNOSIS — F17200 Nicotine dependence, unspecified, uncomplicated: Secondary | ICD-10-CM

## 2017-07-22 DIAGNOSIS — G2581 Restless legs syndrome: Secondary | ICD-10-CM | POA: Diagnosis present

## 2017-07-22 DIAGNOSIS — Z9114 Patient's other noncompliance with medication regimen: Secondary | ICD-10-CM

## 2017-07-22 DIAGNOSIS — R64 Cachexia: Secondary | ICD-10-CM | POA: Diagnosis present

## 2017-07-22 DIAGNOSIS — D638 Anemia in other chronic diseases classified elsewhere: Secondary | ICD-10-CM | POA: Diagnosis present

## 2017-07-22 DIAGNOSIS — E860 Dehydration: Secondary | ICD-10-CM | POA: Diagnosis present

## 2017-07-22 DIAGNOSIS — Z6821 Body mass index (BMI) 21.0-21.9, adult: Secondary | ICD-10-CM

## 2017-07-22 DIAGNOSIS — E785 Hyperlipidemia, unspecified: Secondary | ICD-10-CM | POA: Diagnosis present

## 2017-07-22 DIAGNOSIS — E1143 Type 2 diabetes mellitus with diabetic autonomic (poly)neuropathy: Secondary | ICD-10-CM | POA: Diagnosis present

## 2017-07-22 DIAGNOSIS — R1110 Vomiting, unspecified: Secondary | ICD-10-CM

## 2017-07-22 DIAGNOSIS — K5900 Constipation, unspecified: Secondary | ICD-10-CM | POA: Diagnosis present

## 2017-07-22 DIAGNOSIS — R531 Weakness: Secondary | ICD-10-CM

## 2017-07-22 HISTORY — DX: Repeated falls: R29.6

## 2017-07-22 LAB — URINALYSIS, ROUTINE W REFLEX MICROSCOPIC
Bilirubin Urine: NEGATIVE
Glucose, UA: 150 mg/dL — AB
Hgb urine dipstick: NEGATIVE
Ketones, ur: 5 mg/dL — AB
Leukocytes, UA: NEGATIVE
NITRITE: NEGATIVE
PH: 7 (ref 5.0–8.0)
Protein, ur: >=300 mg/dL — AB
SPECIFIC GRAVITY, URINE: 1.022 (ref 1.005–1.030)

## 2017-07-22 LAB — URINE CULTURE: Culture: >=100000 — AB

## 2017-07-22 LAB — COMPREHENSIVE METABOLIC PANEL
ALT: 8 U/L — ABNORMAL LOW (ref 14–54)
ANION GAP: 7 (ref 5–15)
AST: 20 U/L (ref 15–41)
Albumin: 2.9 g/dL — ABNORMAL LOW (ref 3.5–5.0)
Alkaline Phosphatase: 105 U/L (ref 38–126)
BUN: 33 mg/dL — ABNORMAL HIGH (ref 6–20)
CO2: 31 mmol/L (ref 22–32)
Calcium: 9.5 mg/dL (ref 8.9–10.3)
Chloride: 104 mmol/L (ref 101–111)
Creatinine, Ser: 1.62 mg/dL — ABNORMAL HIGH (ref 0.44–1.00)
GFR calc non Af Amer: 34 mL/min — ABNORMAL LOW (ref >60)
GFR, EST AFRICAN AMERICAN: 39 mL/min — AB (ref >60)
Glucose, Bld: 174 mg/dL — ABNORMAL HIGH (ref 65–99)
POTASSIUM: 3.8 mmol/L (ref 3.5–5.1)
SODIUM: 142 mmol/L (ref 135–145)
TOTAL PROTEIN: 7.1 g/dL (ref 6.5–8.1)
Total Bilirubin: 0.7 mg/dL (ref 0.3–1.2)

## 2017-07-22 LAB — CBC WITH DIFFERENTIAL/PLATELET
Basophils Absolute: 0.1 10*3/uL (ref 0.0–0.1)
Basophils Relative: 1 %
EOS ABS: 0.1 10*3/uL (ref 0.0–0.7)
EOS PCT: 1 %
HCT: 32.6 % — ABNORMAL LOW (ref 36.0–46.0)
HEMOGLOBIN: 10.2 g/dL — AB (ref 12.0–15.0)
LYMPHS ABS: 3.6 10*3/uL (ref 0.7–4.0)
Lymphocytes Relative: 36 %
MCH: 27.6 pg (ref 26.0–34.0)
MCHC: 31.3 g/dL (ref 30.0–36.0)
MCV: 88.1 fL (ref 78.0–100.0)
MONO ABS: 0.4 10*3/uL (ref 0.1–1.0)
MONOS PCT: 4 %
Neutro Abs: 5.8 10*3/uL (ref 1.7–7.7)
Neutrophils Relative %: 58 %
Platelets: 253 10*3/uL (ref 150–400)
RBC: 3.7 MIL/uL — ABNORMAL LOW (ref 3.87–5.11)
RDW: 14.8 % (ref 11.5–15.5)
WBC: 10 10*3/uL (ref 4.0–10.5)

## 2017-07-22 LAB — CK: CK TOTAL: 92 U/L (ref 38–234)

## 2017-07-22 LAB — I-STAT TROPONIN, ED: Troponin i, poc: 0.02 ng/mL (ref 0.00–0.08)

## 2017-07-22 LAB — TSH: TSH: 0.391 u[IU]/mL (ref 0.350–4.500)

## 2017-07-22 LAB — RAPID URINE DRUG SCREEN, HOSP PERFORMED
AMPHETAMINES: NOT DETECTED
BARBITURATES: NOT DETECTED
BENZODIAZEPINES: NOT DETECTED
Cocaine: NOT DETECTED
Opiates: NOT DETECTED
Tetrahydrocannabinol: NOT DETECTED

## 2017-07-22 LAB — MAGNESIUM: MAGNESIUM: 1.9 mg/dL (ref 1.7–2.4)

## 2017-07-22 LAB — LIPASE, BLOOD: Lipase: 30 U/L (ref 11–51)

## 2017-07-22 LAB — PHOSPHORUS: PHOSPHORUS: 4.1 mg/dL (ref 2.5–4.6)

## 2017-07-22 MED ORDER — ONDANSETRON HCL 4 MG/2ML IJ SOLN
4.0000 mg | Freq: Four times a day (QID) | INTRAMUSCULAR | Status: DC | PRN
  Administered 2017-07-24 – 2017-07-25 (×3): 4 mg via INTRAVENOUS
  Filled 2017-07-22 (×3): qty 2

## 2017-07-22 MED ORDER — ONDANSETRON HCL 4 MG PO TABS: 4.0000 mg | ORAL_TABLET | Freq: Four times a day (QID) | ORAL | Status: DC | PRN

## 2017-07-22 MED ORDER — INSULIN ASPART 100 UNIT/ML ~~LOC~~ SOLN
0.0000 [IU] | Freq: Three times a day (TID) | SUBCUTANEOUS | Status: DC
  Administered 2017-07-23: 1 [IU] via SUBCUTANEOUS
  Administered 2017-07-23: 2 [IU] via SUBCUTANEOUS
  Administered 2017-07-24 – 2017-07-25 (×2): 1 [IU] via SUBCUTANEOUS

## 2017-07-22 MED ORDER — HYDROCODONE-ACETAMINOPHEN 5-325 MG PO TABS
1.0000 | ORAL_TABLET | ORAL | Status: DC | PRN
  Administered 2017-07-23 – 2017-07-24 (×2): 1 via ORAL
  Administered 2017-07-24: 2 via ORAL
  Administered 2017-07-24: 1 via ORAL
  Administered 2017-07-25: 2 via ORAL
  Filled 2017-07-22 (×2): qty 1
  Filled 2017-07-22 (×2): qty 2
  Filled 2017-07-22: qty 1

## 2017-07-22 MED ORDER — INSULIN ASPART 100 UNIT/ML ~~LOC~~ SOLN
0.0000 [IU] | Freq: Every day | SUBCUTANEOUS | Status: DC
  Administered 2017-07-23 (×2): 2 [IU] via SUBCUTANEOUS

## 2017-07-22 MED ORDER — POLYETHYLENE GLYCOL 3350 17 G PO PACK
17.0000 g | PACK | Freq: Every day | ORAL | Status: DC | PRN
  Administered 2017-07-23: 17 g via ORAL
  Filled 2017-07-22: qty 1

## 2017-07-22 MED ORDER — ENOXAPARIN SODIUM 30 MG/0.3ML ~~LOC~~ SOLN
30.0000 mg | Freq: Every day | SUBCUTANEOUS | Status: DC
  Administered 2017-07-23 (×2): 30 mg via SUBCUTANEOUS
  Filled 2017-07-22 (×2): qty 0.3

## 2017-07-22 MED ORDER — ACETAMINOPHEN 325 MG PO TABS
650.0000 mg | ORAL_TABLET | Freq: Four times a day (QID) | ORAL | Status: DC | PRN
  Administered 2017-07-23: 650 mg via ORAL
  Filled 2017-07-22: qty 2

## 2017-07-22 MED ORDER — BISACODYL 10 MG RE SUPP: 10.0000 mg | Freq: Every day | RECTAL | Status: DC | PRN

## 2017-07-22 MED ORDER — ACETAMINOPHEN 650 MG RE SUPP: 650.0000 mg | Freq: Four times a day (QID) | RECTAL | Status: DC | PRN

## 2017-07-22 MED ORDER — SODIUM CHLORIDE 0.9 % IV SOLN
INTRAVENOUS | Status: AC
  Administered 2017-07-23: via INTRAVENOUS

## 2017-07-22 MED ORDER — SODIUM CHLORIDE 0.9 % IV SOLN
1.0000 g | Freq: Once | INTRAVENOUS | Status: AC
  Administered 2017-07-22: 1 g via INTRAVENOUS
  Filled 2017-07-22: qty 10

## 2017-07-22 MED ORDER — SODIUM CHLORIDE 0.9 % IV BOLUS
500.0000 mL | Freq: Once | INTRAVENOUS | Status: AC
  Administered 2017-07-22: 500 mL via INTRAVENOUS

## 2017-07-22 NOTE — ED Notes (Signed)
Patient transported to CT 

## 2017-07-22 NOTE — ED Notes (Signed)
Pt is aware a urine sample is needed but unable to provide one. Pt was told to call when she could go.

## 2017-07-22 NOTE — ED Notes (Signed)
ED TO INPATIENT HANDOFF REPORT  Name/Age/Gender Andrea Glenn 57 y.o. female  Code Status Code Status History    Date Active Date Inactive Code Status Order ID Comments User Context   10/08/2016 0139 10/10/2016 1811 Full Code [id number]  Rogue Bussing, MD ED    Advance Directive Documentation     Most Recent Value  Type of Advance Directive  Healthcare Power of Attorney  Pre-existing out of facility DNR order (yellow form or pink MOST form)  -  "MOST" Form in Place?  -      Home/SNF/Other Home  Chief Complaint tremors  Level of Care/Admitting Diagnosis ED Disposition    ED Disposition Condition [place]: [place] [100102]  Level of Care: Telemetry [5]  Admit to tele based on following criteria: Monitor for Ischemic changes  Admit to tele based on following criteria: Eval of Syncope  Diagnosis: Dehydration [276.51.ICD-9-CM]  Admitting Physician: Toy Baker [[id number]]  Attending Physician: Toy Baker [[id number]]  PT Class (Do Not Modify): Observation [104]  PT Acc Code (Do Not Modify): Observation [10022]       Medical History Past Medical History:  Diagnosis Date  . Diabetes mellitus without complication ([place])   . Edema of both legs   . Hypertension   . Neuropathy     Allergies Allergies  Allergen Reactions  . Atorvastatin Other (See Comments)    Suicidal thoughts  . Amitriptyline Other (See Comments)    Depression  . Metformin And Related Diarrhea    IV Location/Drains/Wounds Patient Lines/Drains/Airways Status   Active Line/Drains/Airways    None          Labs/Imaging Results for orders placed or performed during the hospital encounter of 07/22/17 (from the past 48 hour(s))  CBC with Differential     Status: Abnormal   Collection Time: 07/22/17  5:45 PM  Result Value Ref Range   WBC 10.0 4.0 - 10.5 K/uL   RBC 3.70 (L) 3.87 - 5.11 MIL/uL   Hemoglobin 10.2 (L) 12.0 - 15.0 g/dL    HCT 32.6 (L) 36.0 - 46.0 %   MCV 88.1 78.0 - 100.0 fL   MCH 27.6 26.0 - 34.0 pg   MCHC 31.3 30.0 - 36.0 g/dL   RDW 14.8 11.5 - 15.5 %   Platelets 253 150 - 400 K/uL   Neutrophils Relative % 58 %   Neutro Abs 5.8 1.7 - 7.7 K/uL   Lymphocytes Relative 36 %   Lymphs Abs 3.6 0.7 - 4.0 K/uL   Monocytes Relative 4 %   Monocytes Absolute 0.4 0.1 - 1.0 K/uL   Eosinophils Relative 1 %   Eosinophils Absolute 0.1 0.0 - 0.7 K/uL   Basophils Relative 1 %   Basophils Absolute 0.1 0.0 - 0.1 K/uL    Comment: Performed at [facility], [place] [address], [place],  [address]  Comprehensive metabolic panel     Status: Abnormal   Collection Time: 07/22/17  5:45 PM  Result Value Ref Range   Sodium 142 135 - 145 mmol/L   Potassium 3.8 3.5 - 5.1 mmol/L   Chloride 104 101 - 111 mmol/L   CO2 31 22 - 32 mmol/L   Glucose, Bld 174 (H) 65 - 99 mg/dL   BUN 33 (H) 6 - 20 mg/dL   Creatinine, Ser 1.62 (H) 0.44 - 1.00 mg/dL   Calcium 9.5 8.9 - 10.3 mg/dL   Total Protein 7.1 6.5 - 8.1 g/dL  Albumin 2.9 (L) 3.5 - 5.0 g/dL   AST 20 15 - 41 U/L   ALT 8 (L) 14 - 54 U/L   Alkaline Phosphatase 105 38 - 126 U/L   Total Bilirubin 0.7 0.3 - 1.2 mg/dL   GFR calc non Af Amer 34 (L) >60 mL/min   GFR calc Af Amer 39 (L) >60 mL/min    Comment: (NOTE) The eGFR has been calculated using the CKD EPI equation. This calculation has not been validated in all clinical situations. eGFR's persistently <60 mL/min signify possible Chronic Kidney Disease.    Anion gap 7 5 - 15    Comment: Performed at [facility], [place] [address] [place], [place] [address]  Lipase, blood     Status: None   Collection Time: 07/22/17  5:45 PM  Result Value Ref Range   Lipase 30 11 - 51 U/L    Comment: Performed at [facility], [place] [address], [place], [place] [address]  CK     Status: None   Collection Time: 07/22/17  5:45 PM  Result Value Ref Range   Total CK 92 38 - 234 U/L     Comment: Performed at [facility], [place] [address] [place], [place] [address]  Magnesium     Status: None   Collection Time: 07/22/17  5:45 PM  Result Value Ref Range   Magnesium 1.9 1.7 - 2.4 mg/dL    Comment: Performed at [facility], [place] [address], [place], [place] [address]  Phosphorus     Status: None   Collection Time: 07/22/17  5:45 PM  Result Value Ref Range   Phosphorus 4.1 2.5 - 4.6 mg/dL    Comment: Performed at [facility], [place] [address], [place], [place] [address]  TSH     Status: None   Collection Time: 07/22/17  5:45 PM  Result Value Ref Range   TSH 0.391 0.350 - 4.500 uIU/mL    Comment: Performed by a 3rd Generation assay with a functional sensitivity of <=0.01 uIU/mL. Performed at [facility], [place] [address] [place], [place] [address]   I-Stat Troponin, ED (not at [facility])     Status: None   Collection Time: 07/22/17  5:55 PM  Result Value Ref Range   Troponin i, poc 0.02 0.00 - 0.08 ng/mL   Comment 3            Comment: Due to the release kinetics of cTnI, a negative result within the first hours of the onset of symptoms does not rule out myocardial infarction with certainty. If myocardial infarction is still suspected, repeat the test at appropriate intervals.   Urinalysis, Routine w reflex microscopic     Status: Abnormal   Collection Time: 07/22/17  8:48 PM  Result Value Ref Range   Color, Urine YELLOW YELLOW   APPearance HAZY (A) CLEAR   Specific Gravity, Urine 1.022 1.005 - 1.030   pH 7.0 5.0 - 8.0   Glucose, UA 150 (A) NEGATIVE mg/dL   Hgb urine dipstick NEGATIVE NEGATIVE   Bilirubin Urine NEGATIVE NEGATIVE   Ketones, ur 5 (A) NEGATIVE mg/dL   Protein, ur >=300 (A) NEGATIVE mg/dL   Nitrite NEGATIVE NEGATIVE   Leukocytes, UA NEGATIVE NEGATIVE   RBC / HPF 6-30 0 - 5 RBC/hpf   WBC, UA 0-5 0 - 5 WBC/hpf   Bacteria, UA RARE (A) NONE SEEN   Squamous Epithelial / LPF 0-5  (A) NONE  SEEN   Mucus PRESENT    Hyaline Casts, UA PRESENT    Granular Casts, UA PRESENT     Comment: Performed at [facility], [place] [address], [place], [place] [address]  Urine rapid drug screen (hosp performed)     Status: None   Collection Time: 07/22/17  8:48 PM  Result Value Ref Range   Opiates NONE DETECTED NONE DETECTED   Cocaine NONE DETECTED NONE DETECTED   Benzodiazepines NONE DETECTED NONE DETECTED   Amphetamines NONE DETECTED NONE DETECTED   Tetrahydrocannabinol NONE DETECTED NONE DETECTED   Barbiturates NONE DETECTED NONE DETECTED    Comment: (NOTE) DRUG SCREEN FOR MEDICAL PURPOSES ONLY.  IF CONFIRMATION IS NEEDED FOR ANY PURPOSE, NOTIFY LAB WITHIN 5 DAYS. LOWEST DETECTABLE LIMITS FOR URINE DRUG SCREEN Drug Class                     Cutoff (ng/mL) Amphetamine and metabolites    1000 Barbiturate and metabolites    200 Benzodiazepine                 892 Tricyclics and metabolites     300 Opiates and metabolites        300 Cocaine and metabolites        300 THC                            50 Performed at [facility], [place] [address] [place],  [address]    Ct Head Wo Contrast  Result Date: 07/22/2017 CLINICAL DATA:  Generalized weakness with increasing tremors. Acute headache. EXAM: CT HEAD WITHOUT CONTRAST TECHNIQUE: Contiguous axial images were obtained from the base of the skull through the vertex without intravenous contrast. COMPARISON:  None. FINDINGS: BRAIN: The ventricles and sulci are normal. No intraparenchymal hemorrhage, mass effect nor midline shift. No acute large vascular territory infarcts. Grey-white matter distinction is maintained. The basal ganglia are unremarkable. No abnormal extra-axial fluid collections. Basal cisterns are not effaced and midline. The brainstem and cerebellar hemispheres are without acute abnormalities. VASCULAR: Unremarkable. SKULL/SOFT TISSUES: No skull fracture. No significant soft  tissue swelling. ORBITS/SINUSES: The included ocular globes and orbital contents are normal.The mastoid air cells are clear. The included paranasal sinuses are well-aerated. OTHER: None. IMPRESSION: Normal appearing noncontrast head CT. Electronically Signed   By: Ashley Royalty M.D.   On: 07/22/2017 19:50    Pending Labs Unresulted Labs (From admission, onward)   Start     Ordered   07/23/17 0500  Vitamin B12  (Anemia Panel (PNL))  Tomorrow morning,   R     07/22/17 2156   07/23/17 0500  Folate  (Anemia Panel (PNL))  Tomorrow morning,   R     07/22/17 2156   07/23/17 0500  Iron and TIBC  (Anemia Panel (PNL))  Tomorrow morning,   R     07/22/17 2156   07/23/17 0500  Ferritin  (Anemia Panel (PNL))  Tomorrow morning,   R     07/22/17 2156   07/23/17 0500  Reticulocytes  (Anemia Panel (PNL))  Tomorrow morning,   R     07/22/17 2156   07/23/17 0500  Protein electrophoresis, serum  Tomorrow morning,   R     07/22/17 2215   07/22/17 2216  Sodium, urine, random  Once,   R     07/22/17 2215   07/22/17 2216  Creatinine, urine, random  Once,  R     07/22/17 2215   07/22/17 2158  Troponin I (q 6hr x 3)  Now then every 6 hours,   R     07/22/17 2157   07/22/17 2025  T4, free  Once,   R     07/22/17 2024   07/22/17 2025  T3  Once,   R     07/22/17 2024   07/22/17 1706  Blood culture (routine x 2)  BLOOD CULTURE X 2,   STAT     07/22/17 1706   Unscheduled  Occult blood card to lab, stool RN will collect  As needed,   R    Question:  Specimen to be collected by?  Answer:  RN will collect   07/22/17 2214   Signed and Held  Magnesium  Tomorrow morning,   R    Comments:  Call MD if <1.5    Signed and Held   Signed and Held  Phosphorus  Tomorrow morning,   R     Signed and Held   Signed and Held  Comprehensive metabolic panel  Once,   R    Comments:  Cal MD for K<3.5 or >5.0    Signed and Held   Signed and Held  CBC  Once,   R    Comments:  Call for hg <8.0    Signed and Held       Vitals/Pain Today's Vitals   07/22/17 2115 07/22/17 2130 07/22/17 2145 07/22/17 2200  BP:  100/88  100/83  Pulse: 91 89 90 88  Resp: '20 10 11 14  ' Temp:      TempSrc:      SpO2: 94% 100% 100% 100%  PainSc:        Isolation Precautions No active isolations  Medications Medications  sodium chloride 0.9 % bolus 500 mL (0 mLs Intravenous Stopped 07/22/17 1940)  cefTRIAXone (ROCEPHIN) 1 g in sodium chloride 0.9 % 100 mL IVPB (0 g Intravenous Stopped 07/22/17 1840)    Mobility walks

## 2017-07-22 NOTE — H&P (Signed)
Andrea Glenn RSW:[id number] DOB: 1961-01-19 DOA: 07/22/2017     PCP: Riverton Bing, DO   Outpatient Specialists:  Endocrinology    Patient arrived to ER on 07/22/17 at 1451  Patient coming from:  home Lives alone,     Chief Complaint:  Chief Complaint  Patient presents with  . Weakness    HPI: Andrea Glenn is a 57 y.o. female with medical history significant of uncontrolled type 2 Dm with peripheral neuropathy and HTN, diastolic CHF. Pancreatitis in 2018    Presented with  Unsteady gate and tremor which she attributed to discontinuing her Neurontin she was taken for hx of neuropathy. thought to be possibly treatment induced neuropathy to rapid improvement of DM control.  Reports episodes of syncope. Reports when she falls down she passes out.  Reports progressive fatigue, increased tremor falls. In ER waiting room patient slid off on the floor from the wheelchair stating she felt better like that.   Has had daily vomiting. Reports some constipation. Worsening leg edema since February 21 she thought it was due to eating TV dinners.  She was started on Lasix but she did not take but 2 pills because she felt confused.  She continues to smoke.  No fever or chills.  Reports episode of shortness of breath while she was feeling weak and had trouble ambulating today.  Has lon standing hypertension well controlled. reprts headache worse after taking her BP meds.   She was seen in ER 3 days ago for the same. Noted to have UTI , urine culture positive for E.coli and STREPTOCOCCUS GALLOLYTICUS She was prescribed Keflex  Have taken 3 doses.    Reports never had a colonoscopy. Last echo was in February 2019 showing grade 2 diastolic dysfunction.   While in ER: Was found to have slight rise in Cr  Significant initial  Findings: Abnormal Labs Reviewed  CBC WITH DIFFERENTIAL/PLATELET - Abnormal; Notable for the following components:      Result Value   RBC 3.70 (*)    Hemoglobin  10.2 (*)    HCT 32.6 (*)    All other components within normal limits  COMPREHENSIVE METABOLIC PANEL - Abnormal; Notable for the following components:   Glucose, Bld 174 (*)    BUN 33 (*)    Creatinine, Ser 1.62 (*)    Albumin 2.9 (*)    ALT 8 (*)    GFR calc non Af Amer 34 (*)    GFR calc Af Amer 39 (*)    All other components within normal limits      Na 142 K 3.8  Cr Up from baseline see below Lab Results  Component Value Date   CREATININE 1.62 (H) 07/22/2017   CREATININE 1.60 (H) 07/18/2017   CREATININE 1.46 (H) 07/12/2017      WBC  10  HG/HCT  stable,       Component Value Date/Time   HGB 10.2 (L) 07/22/2017 1745   HGB 10.1 (L) 06/16/2017 1050   HCT 32.6 (L) 07/22/2017 1745   HCT 31.3 (L) 06/16/2017 1050       Troponin (Point of Care Test) Recent Labs    07/22/17 1755  TROPIPOC 0.02       BNP (last 3 results) Recent Labs    07/12/17 1550  BNP 174.8*    ProBNP (last 3 results) No results for input(s): PROBNP in the last 8760 hours.  Lactic Acid, Venous No results found for: LATICACIDVEN  UA ordered   CT HEAD  NON acute  CXR -  NON acute on 07/18/2017    ECG:  Personally reviewed by me showing: HR : 92 Rhythm:  NSR,   no evidence of ischemic changes [id number]    ED Triage Vitals  Enc Vitals Group     BP 07/22/17 1503 (!) 96/47     Pulse Rate 07/22/17 1503 92     Resp 07/22/17 1503 18     Temp 07/22/17 1503 97.8 F (36.6 C)     Temp Source 07/22/17 1503 Oral     SpO2 07/22/17 1503 100 %     Weight --      Height --      Head Circumference --      Peak Flow --      Pain Score 07/22/17 1940 0     Pain Loc --      Pain Edu? --      Excl. in [place]? --   TMAX(24)@     on arrival  ED Triage Vitals  Enc Vitals Group     BP 07/22/17 1503 (!) 96/47     Pulse Rate 07/22/17 1503 92     Resp 07/22/17 1503 18     Temp 07/22/17 1503 97.8 F (36.6 C)     Temp Source 07/22/17 1503 Oral     SpO2 07/22/17 1503 100 %     Weight --       Height --      Head Circumference --      Peak Flow --      Pain Score 07/22/17 1940 0     Pain Loc --      Pain Edu? --      Excl. in [place]? --      Latest  Blood pressure (!) 136/110, pulse 93, temperature 97.8 F (36.6 C), temperature source Oral, resp. rate 17, SpO2 99 %.   Following Medications were ordered in ER: Medications  sodium chloride 0.9 % bolus 500 mL (0 mLs Intravenous Stopped 07/22/17 1940)  cefTRIAXone (ROCEPHIN) 1 g in sodium chloride 0.9 % 100 mL IVPB (0 g Intravenous Stopped 07/22/17 1840)       Hospitalist was called for admission for dehydration   Review of Systems:    Pertinent positives include: falls, Bilateral lower extremity swelling nausea, constipation.   Constitutional:  No weight loss, night sweats, Fevers, chills, fatigue, weight loss  HEENT:  No headaches, Difficulty swallowing,Tooth/dental problems,Sore throat,  No sneezing, itching, ear ache, nasal congestion, post nasal drip,  Cardio-vascular:  No chest pain, Orthopnea, PND, anasarca, dizziness, palpitations.no  GI:  No heartburn, indigestion, abdominal pain, nausea, vomiting, diarrhea, change in bowel habits, loss of appetite, melena, blood in stool, hematemesis Resp:  no shortness of breath at rest. No dyspnea on exertion, No excess mucus, no productive cough, No non-productive cough, No coughing up of blood.No change in color of mucus.No wheezing. Skin:  no rash or lesions. No jaundice GU:  no dysuria, change in color of urine, no urgency or frequency. No straining to urinate.  No flank pain.  Musculoskeletal:  No joint pain or no joint swelling. No decreased range of motion. No back pain.  Psych:  No change in mood or affect. No depression or anxiety. No memory loss.  Neuro: no localizing neurological complaints, no tingling, no weakness, no double vision, no gait abnormality, no slurred speech, no confusion  As per HPI otherwise 10 point review of systems negative.  Past  Medical History:   Past Medical History:  Diagnosis Date  . Diabetes mellitus without complication ([place])   . Edema of both legs   . Hypertension   . Neuropathy       Past Surgical History:  Procedure Laterality Date  . BACK SURGERY    . CESAREAN SECTION    . FOOT SURGERY    . SHOULDER SURGERY      Social History:  Ambulatory   walker       reports that she has been smoking cigarettes.  She started smoking about 40 years ago. She has a 2.00 pack-year smoking history. She has never used smokeless tobacco. She reports that she does not drink alcohol or use drugs.   Family History:   Family History  Problem Relation Age of Onset  . Kidney failure Mother   . Diabetes Mother   . Thyroid disease Mother   . Hypertension Mother   . Heart failure Mother   . Kidney failure Father   . Cancer Sister   . Stroke Brother   . Cancer Other     Allergies: Allergies  Allergen Reactions  . Atorvastatin Other (See Comments)    Suicidal thoughts  . Amitriptyline Other (See Comments)    Depression  . Metformin And Related Diarrhea     Prior to Admission medications   Medication Sig Start Date End Date Taking? Authorizing Provider  cephALEXin (KEFLEX) 500 MG capsule Take 1 capsule (500 mg total) by mouth 4 (four) times daily. 07/18/17  Yes Mackuen, Courteney Lyn, MD  hydrochlorothiazide (HYDRODIURIL) 12.5 MG tablet Take 1 tablet (12.5 mg total) by mouth daily. 06/28/17  Yes Donora Bing, DO  acetaminophen (TYLENOL) 500 MG tablet Take 1,000 mg by mouth every 6 (six) hours as needed for headache (pain).    [provider]  Dulaglutide (TRULICITY) 9.48 NI/6.2VO SOPN Inject 0.75 mg into the skin once a week. Patient taking differently: Inject 0.75 mg into the skin every Tuesday.  05/19/17   Zenia Resides, MD  gabapentin (NEURONTIN) 300 MG capsule Take 600 mg by mouth three times daily Patient not taking: Reported on 07/22/2017 07/14/17   Zenia Resides, MD  nicotine  (NICODERM CQ - DOSED IN MG/24 HR) 7 mg/24hr patch Place 1 patch (7 mg total) onto the skin daily. Patient not taking: Reported on 07/18/2017 05/19/17   Zenia Resides, MD  OVER THE COUNTER MEDICATION Place 1 drop into both eyes 4 (four) times daily as needed (dry eyes/irritation). Over the counter lubricating eye drop    [provider]   Physical Exam: Blood pressure (!) 136/110, pulse 93, temperature 97.8 F (36.6 C), temperature source Oral, resp. rate 17, SpO2 99 %. 1. General:  in No Acute distress  Chronically ill  -appearing 2. Psychological: Alert and   Oriented 3. Head/ENT:     Dry Mucous Membranes                          Head Non traumatic, neck supple                          Poor Dentition 4. SKIN:  decreased Skin turgor,  Skin clean Dry and intact no rash 5. Heart: Regular rate and rhythm no  Murmur, no Rub or gallop 6. Lungs:  no wheezes or crackles   7. Abdomen: Soft,  non-tender, Non distended bowel sounds present 8.  Lower extremities: no clubbing, cyanosis, no edema 9. Neurologically Grossly intact, moving all 4 extremities equally  10. MSK: Normal range of motion   LABS:     Recent Labs  Lab 07/18/17 1734 07/22/17 1745  WBC 10.2 10.0  NEUTROABS  --  5.8  HGB 10.4* 10.2*  HCT 33.1* 32.6*  MCV 86.6 88.1  PLT 253 646   Basic Metabolic Panel: Recent Labs  Lab 07/18/17 1734 07/22/17 1745  NA 136 142  K 3.7 3.8  CL 101 104  CO2 25 31  GLUCOSE 342* 174*  BUN 28* 33*  CREATININE 1.60* 1.62*  CALCIUM 9.3 9.5      Recent Labs  Lab 07/18/17 1734 07/22/17 1745  AST 16 20  ALT 8* 8*  ALKPHOS 121 105  BILITOT 0.6 0.7  PROT 6.9 7.1  ALBUMIN 2.8* 2.9*   Recent Labs  Lab 07/22/17 1745  LIPASE 30   No results for input(s): AMMONIA in the last 168 hours.    HbA1C: No results for input(s): HGBA1C in the last 72 hours. CBG: No results for input(s): GLUCAP in the last 168 hours.    Urine analysis:    Component Value Date/Time    COLORURINE AMBER (A) 07/18/2017 2301   APPEARANCEUR CLOUDY (A) 07/18/2017 2301   LABSPEC 1.020 07/18/2017 2301   PHURINE 5.0 07/18/2017 2301   GLUCOSEU >=500 (A) 07/18/2017 2301   HGBUR LARGE (A) 07/18/2017 2301   BILIRUBINUR NEGATIVE 07/18/2017 2301   BILIRUBINUR negative 01/20/2017 1645   KETONESUR NEGATIVE 07/18/2017 2301   PROTEINUR >=300 (A) 07/18/2017 2301   UROBILINOGEN 1.0 01/20/2017 1645   UROBILINOGEN 0.2 07/28/2013 0008   NITRITE NEGATIVE 07/18/2017 2301   LEUKOCYTESUR SMALL (A) 07/18/2017 2301      Cultures:    Component Value Date/Time   SDES URINE, CLEAN CATCH 07/19/2017 0009   SPECREQUEST  07/19/2017 0009    NONE Performed at [facility] [address], [place], [place] [id number]    CULT (A) 07/19/2017 0009    >=100,000 COLONIES/mL ESCHERICHIA COLI >=100,000 COLONIES/mL STREPTOCOCCUS GALLOLYTICUS    REPTSTATUS 07/22/2017 FINAL 07/19/2017 0009     Radiological Exams on Admission: Ct Head Wo Contrast  Result Date: 07/22/2017 CLINICAL DATA:  Generalized weakness with increasing tremors. Acute headache. EXAM: CT HEAD WITHOUT CONTRAST TECHNIQUE: Contiguous axial images were obtained from the base of the skull through the vertex without intravenous contrast. COMPARISON:  None. FINDINGS: BRAIN: The ventricles and sulci are normal. No intraparenchymal hemorrhage, mass effect nor midline shift. No acute large vascular territory infarcts. Grey-white matter distinction is maintained. The basal ganglia are unremarkable. No abnormal extra-axial fluid collections. Basal cisterns are not effaced and midline. The brainstem and cerebellar hemispheres are without acute abnormalities. VASCULAR: Unremarkable. SKULL/SOFT TISSUES: No skull fracture. No significant soft tissue swelling. ORBITS/SINUSES: The included ocular globes and orbital contents are normal.The mastoid air cells are clear. The included paranasal sinuses are well-aerated. OTHER: None. IMPRESSION: Normal appearing  noncontrast head CT. Electronically Signed   By: Ashley Royalty M.D.   On: 07/22/2017 19:50    Chart has been reviewed    Assessment/Plan  57 y.o. female with medical history significant of uncontrolled type 2 Dm with peripheral neuropathy and HTN  Admitted for  Positional orthostasis recurrent falls. Possible dehydration  Present on Admission: Syncope -  given risk factor will admit rehydrate obtain CE, monitor on tele and obtain carotid dopplers, patient have had recent echo.   . Uncontrolled type 2 diabetes mellitus  with peripheral neuropathy ([place])  Hold home medications order SSI . Tobacco use disorder - nicotine patch ordered   . CKD (chronic kidney disease) - cr slightly up from baseline, protein uria noted. Given hx of DM 2 would benefit from ACE initiation once stable.  . Anemia of chronic disease -  -Obtain anemia panel - Hemoccult stool 3 -  TSH WNL - If evidence of iron deficiency anemia or Hemoccult positive stools will need further GI workup   .  lower UTI Await result of Urine culture partially treated. Cont rocephin for 1 more day . Dehydration - rehydrate hold HCTZ check orthostatics Constipation obtain KUB  Tremor not noted during exam - would benefit from neurology follow up Other plan as per orders.  DVT prophylaxis:   Lovenox     Code Status:  FULL CODE  as per patient    Family Communication:   Family  not at  Bedside    Disposition Plan:    To home once workup is complete and patient is stable                        Would benefit from PT/OT eval prior to DC   ordered                       Nutrition    consulted                          Consults called: none    Admission status:   obs   Level of care     tele       Toy Baker 07/22/2017, 10:23 PM    [facility]  Pager [phone]   after 2 AM please page floor coverage PA If 7AM-7PM, please contact the day team taking care of the patient  [email or web address]  Password [email or web address]

## 2017-07-22 NOTE — Telephone Encounter (Signed)
Patient wants to be called to discuss her medication problems.Andrea Glenn, CMA

## 2017-07-22 NOTE — ED Provider Notes (Signed)
[facility] Provider Note   CSN: [id number] Arrival date & time: 07/22/17  1451     History   Chief Complaint Chief Complaint  Patient presents with  . Weakness    HPI Andrea Glenn is a 57 y.o. female with a history of tobacco use disorder, uncontrolled DM Type II with peripheral neuropathy, restless leg syndrome, HLD, pancreatitis, HTN, anemia of chronic disease, and CKD who presents to the emergency department with generalized weakness.  She reports she had an appointment with Dr. Yisroel Ramming this afternoon. She was getting ready for the appointment and walking down her hallway with her walker when her body began to tremble and she slid down into the floor.  She denies hitting her head or LOC. She was able to crawl to the couch and then out to her front porch because her daughter's father was going to drive her to the appointment, but she didn't think she could slide down the stairs so she called EMS.  She states that at times that she will have trembling of one arm or leg that will resolve within 5-7 minutes.  She also endorses 1 daily episode of NBNB emesis over the last 3 days and LUQ pain that radiates to the right mid-back over the last few days. She also endorses intermittent headaches over the last 2 weeks that seem to improve after taking her blood pressure medication.   She endorses a "15lb weight gain in each leg in Feb. 2019. States she was started on Lasix with no improvement then switched to hydrochlorothiazide, and the swelling improved.   No fever, chills, dyspnea, rash, diarrhea,   She has been out of work for the last month due to her symptoms. The patient lives alone. The patient's grandchildren report they are getting more concerned due to her more frequent falls.   PCP Dr. Yisroel Ramming   Weakness  Associated symptoms include vomiting and headaches. Pertinent negatives include no shortness of breath, no chest pain and no  confusion.    Past Medical History:  Diagnosis Date  . Diabetes mellitus without complication ([place])   . Edema of both legs   . Hypertension   . Neuropathy     Patient Active Problem List   Diagnosis Date Noted  . Acute lower UTI 07/22/2017  . Frequent falls 07/22/2017  . Dehydration 07/22/2017  . Syncope 07/22/2017  . CKD (chronic kidney disease) 07/08/2017  . Anemia of chronic disease 06/21/2017  . Primary hypertension 02/03/2017  . Hematuria 01/20/2017  . Dysuria 01/20/2017  . Lower extremity edema 12/30/2016  . Pancreatitis 10/08/2016  . Hyperlipidemia associated with type 2 diabetes mellitus ([place]) 10/26/2013  . Restless leg syndrome 03/03/2010  . Uncontrolled type 2 diabetes mellitus with peripheral neuropathy ([place]) 06/16/2006  . Tobacco use disorder 06/16/2006    Past Surgical History:  Procedure Laterality Date  . BACK SURGERY    . CESAREAN SECTION    . FOOT SURGERY    . SHOULDER SURGERY       OB History   None      Home Medications    Prior to Admission medications   Medication Sig Start Date End Date Taking? Authorizing Provider  cephALEXin (KEFLEX) 500 MG capsule Take 1 capsule (500 mg total) by mouth 4 (four) times daily. 07/18/17  Yes Mackuen, Courteney Lyn, MD  hydrochlorothiazide (HYDRODIURIL) 12.5 MG tablet Take 1 tablet (12.5 mg total) by mouth daily. 06/28/17  Yes Hertford Bing, DO  acetaminophen (TYLENOL)  500 MG tablet Take 1,000 mg by mouth every 6 (six) hours as needed for headache (pain).    [provider]  Dulaglutide (TRULICITY) 6.28 BT/5.1VO SOPN Inject 0.75 mg into the skin once a week. Patient taking differently: Inject 0.75 mg into the skin every Tuesday.  05/19/17   Zenia Resides, MD  gabapentin (NEURONTIN) 300 MG capsule Take 600 mg by mouth three times daily Patient not taking: Reported on 07/22/2017 07/14/17   Zenia Resides, MD  nicotine (NICODERM CQ - DOSED IN MG/24 HR) 7 mg/24hr patch Place 1 patch (7 mg total) onto  the skin daily. Patient not taking: Reported on 07/18/2017 05/19/17   Zenia Resides, MD  OVER THE COUNTER MEDICATION Place 1 drop into both eyes 4 (four) times daily as needed (dry eyes/irritation). Over the counter lubricating eye drop    [provider]    Family History Family History  Problem Relation Age of Onset  . Kidney failure Mother   . Diabetes Mother   . Thyroid disease Mother   . Hypertension Mother   . Heart failure Mother   . Kidney failure Father   . Cancer Sister   . Stroke Brother   . Cancer Other     Social History Social History   Tobacco Use  . Smoking status: Current Every Day Smoker    Packs/day: 0.10    Years: 20.00    Pack years: 2.00    Types: Cigarettes    Start date: 41  . Smokeless tobacco: Never Used  . Tobacco comment: working on quitting - 2 per day - 2.26.19  Substance Use Topics  . Alcohol use: No  . Drug use: No     Allergies   Atorvastatin; Amitriptyline; and Metformin and related   Review of Systems Review of Systems  Constitutional: Negative for activity change, chills and fever.  Respiratory: Negative for shortness of breath.   Cardiovascular: Negative for chest pain.  Gastrointestinal: Positive for abdominal pain and vomiting.  Genitourinary: Negative for dysuria.  Musculoskeletal: Positive for back pain.  Skin: Negative for rash.  Allergic/Immunologic: Negative for immunocompromised state.  Neurological: Positive for tremors, weakness and headaches.  Psychiatric/Behavioral: Negative for confusion.   Physical Exam Updated Vital Signs BP (!) 170/85 (BP Location: Left Arm)   Pulse 92   Temp 98.5 F (36.9 C) (Oral)   Resp 16   SpO2 100%   Physical Exam  Constitutional: She is oriented to person, place, and time. No distress.  Cachectic appearing middle-aged female.  HENT:  Head: Normocephalic.  Eyes: Conjunctivae are normal.  Neck: Normal range of motion. Neck supple.  Cardiovascular: Normal rate  and regular rhythm. Exam reveals no gallop and no friction rub.  No murmur heard. Pulmonary/Chest: Effort normal and breath sounds normal. No stridor. No respiratory distress. She has no wheezes. She has no rales. She exhibits no tenderness.  Abdominal: Soft. Bowel sounds are normal. She exhibits no distension and no mass. There is no tenderness. There is no rebound and no guarding. No hernia.  Musculoskeletal: She exhibits edema. She exhibits no deformity.  Skin of the bilateral lower extremities appears dry.  Mild bilateral lower extremity edema, L>R.   Neurological: She is alert and oriented to person, place, and time.  5 out of 5 strength of the bilateral upper and lower extremities against resistance.  Sensation is intact throughout.  Patient had an episode of "trembling" during my examination. Noted to be shaking of the entire right upper  extremity for 2-3 minutes followed by left upper extremity. Patient was holding onto the bedrail when it began. No shaking of the bilateral arms simultaneously. No noted shaking of the legs. No muscle fasciculations noted. Did not appear to be kinetic or postural as the patient was able to previously extend the upper limbs horizontally without shaking. Did not appear to occur with voluntary movement.   Skin: Skin is warm. Capillary refill takes less than 2 seconds. No rash noted. She is not diaphoretic.  Psychiatric: Her behavior is normal.  Nursing note and vitals reviewed.  ED Treatments / Results  Labs (all labs ordered are listed, but only abnormal results are displayed) Labs Reviewed  CBC WITH DIFFERENTIAL/PLATELET - Abnormal; Notable for the following components:      Result Value   RBC 3.70 (*)    Hemoglobin 10.2 (*)    HCT 32.6 (*)    All other components within normal limits  COMPREHENSIVE METABOLIC PANEL - Abnormal; Notable for the following components:   Glucose, Bld 174 (*)    BUN 33 (*)    Creatinine, Ser 1.62 (*)    Albumin 2.9 (*)      ALT 8 (*)    GFR calc non Af Amer 34 (*)    GFR calc Af Amer 39 (*)    All other components within normal limits  URINALYSIS, ROUTINE W REFLEX MICROSCOPIC - Abnormal; Notable for the following components:   APPearance HAZY (*)    Glucose, UA 150 (*)    Ketones, ur 5 (*)    Protein, ur >=300 (*)    Bacteria, UA RARE (*)    Squamous Epithelial / LPF 0-5 (*)    All other components within normal limits  CULTURE, BLOOD (ROUTINE X 2)  CULTURE, BLOOD (ROUTINE X 2)  LIPASE, BLOOD  CK  RAPID URINE DRUG SCREEN, HOSP PERFORMED  MAGNESIUM  PHOSPHORUS  TSH  T4, FREE  T3  VITAMIN B12  FOLATE  IRON AND TIBC  FERRITIN  RETICULOCYTES  TROPONIN I  TROPONIN I  TROPONIN I  SODIUM, URINE, RANDOM  CREATININE, URINE, RANDOM  PROTEIN ELECTROPHORESIS, SERUM  MAGNESIUM  PHOSPHORUS  COMPREHENSIVE METABOLIC PANEL  CBC  I-STAT TROPONIN, ED    EKG EKG Interpretation  Date/Time:  Friday July 22 2017 17:18:32 EDT Ventricular Rate:  92 PR Interval:    QRS Duration: 104 QT Interval:  380 QTC Calculation: 471 R Axis:   73 Text Interpretation:  Sinus rhythm Consider left atrial enlargement Nonspecific T abnormalities, lateral leads No significant change since last tracing Confirmed by Duffy Bruce [phone]) on 07/22/2017 6:46:20 PM   Radiology Dg Abd 1 View  Result Date: 07/22/2017 CLINICAL DATA:  Weakness and vomiting. EXAM: ABDOMEN - 1 VIEW COMPARISON:  CT abdomen pelvis dated October 07, 2016. FINDINGS: The bowel gas pattern is normal. No radio-opaque calculi or other significant radiographic abnormality are seen. IMPRESSION: Negative. Electronically Signed   By: Titus Dubin M.D.   On: 07/22/2017 23:11   Ct Head Wo Contrast  Result Date: 07/22/2017 CLINICAL DATA:  Generalized weakness with increasing tremors. Acute headache. EXAM: CT HEAD WITHOUT CONTRAST TECHNIQUE: Contiguous axial images were obtained from the base of the skull through the vertex without intravenous contrast.  COMPARISON:  None. FINDINGS: BRAIN: The ventricles and sulci are normal. No intraparenchymal hemorrhage, mass effect nor midline shift. No acute large vascular territory infarcts. Grey-white matter distinction is maintained. The basal ganglia are unremarkable. No abnormal extra-axial fluid collections. Basal cisterns are not  effaced and midline. The brainstem and cerebellar hemispheres are without acute abnormalities. VASCULAR: Unremarkable. SKULL/SOFT TISSUES: No skull fracture. No significant soft tissue swelling. ORBITS/SINUSES: The included ocular globes and orbital contents are normal.The mastoid air cells are clear. The included paranasal sinuses are well-aerated. OTHER: None. IMPRESSION: Normal appearing noncontrast head CT. Electronically Signed   By: Ashley Royalty M.D.   On: 07/22/2017 19:50    Procedures Procedures (including critical care time)  Medications Ordered in ED Medications  insulin aspart (novoLOG) injection 0-5 Units (has no administration in time range)  insulin aspart (novoLOG) injection 0-9 Units (has no administration in time range)  acetaminophen (TYLENOL) tablet 650 mg (has no administration in time range)    Or  acetaminophen (TYLENOL) suppository 650 mg (has no administration in time range)  HYDROcodone-acetaminophen (NORCO/VICODIN) 5-325 MG per tablet 1-2 tablet (has no administration in time range)  ondansetron (ZOFRAN) tablet 4 mg (has no administration in time range)    Or  ondansetron (ZOFRAN) injection 4 mg (has no administration in time range)  enoxaparin (LOVENOX) injection 30 mg (has no administration in time range)  0.9 %  sodium chloride infusion (has no administration in time range)  polyethylene glycol (MIRALAX / GLYCOLAX) packet 17 g (has no administration in time range)  bisacodyl (DULCOLAX) suppository 10 mg (has no administration in time range)  sodium chloride 0.9 % bolus 500 mL (0 mLs Intravenous Stopped 07/22/17 1940)  cefTRIAXone (ROCEPHIN) 1 g in  sodium chloride 0.9 % 100 mL IVPB (0 g Intravenous Stopped 07/22/17 1840)     Initial Impression / Assessment and Plan / ED Course  I have reviewed the triage vital signs and the nursing notes.  Pertinent labs & imaging results that were available during my care of the patient were reviewed by me and considered in my medical decision making (see chart for details).    57 year old female with a history of tobacco use disorder, uncontrolled DM Type II with peripheral neuropathy, restless leg syndrome, HLD, pancreatitis, HTN, anemia of chronic disease, and CKD.  She is here with generalized weakness, recurrent falls, headache, vomiting, and "tremors".  She was seen on 4/2 and diagnosed with a UTI. Patient missed 2 days of ABX since she was d/ced due to recurrent falls as she has been laying in bed more. Thinks she may have taken 2 doses.  Urine cultures reviewed from 4/2. Ceftriaxone initiated in the ED. Cr 1.62, increased from the patient's baseline of 1.0-1.1 since 2/19. 500 IVC fluid bolus given. Held additional fluids given h/o of questionable lower extremity edema in February.   Tremors noted during evaluation in the ED. They appear atypical; not consistent with kinetic, postural, or rest.  Question psychosomatic etiology.  Medical record reviewed.  PCP was questioning hyperthyroidism given unknown etiology of her symptoms.  TSH and free T4 are normal. CK is normal.   CT head is negative. Medication list reviewed. She does not appear to have any home medications that increase risk of tremor.   CBC is unremarkable.  Glucose 174.  Anion gap is normal.  Troponin is negative.  EKG without acute changes.   UA today with 3+ proteinuria. This has only been present in her urine since 6/18 and has been gradually worsening. Could be secondary to nephrotic syndrome, diabetes mellitus, protein malnutrition in the setting of vomiting, or AKI.   The patient was discussed with Dr. Ellender Hose, attending physician.   Given new AKI and UTI in the setting of increased falls and  generalized weakness, admission for further work-up and evaluation is recommended.  Dr. Roel Cluck, hospitalist will admit. The patient appears reasonably stabilized for admission considering the current resources, flow, and capabilities available in the ED at this time, and I doubt any other [facility] requiring further screening and/or treatment in the ED prior to admission.  Final Clinical Impressions(s) / ED Diagnoses   Final diagnoses:  Generalized weakness  Acute cystitis with hematuria    ED Discharge Orders    None       Blayn Whetsell A, PA-C 07/22/17 2359    Duffy Bruce, MD 07/23/17 0022

## 2017-07-22 NOTE — Telephone Encounter (Signed)
Pt called to inform Dr Yisroel Ramming that she is in the ED and that is also why she did not make her appointment this afternoon. She wants to be contacted ASAP to discuss the complications she is having with her medications. I explained that I would leave a note . Pt was very unhappy and said she would continue to call today as many times as it took to get an answer.

## 2017-07-22 NOTE — ED Triage Notes (Signed)
Per EMS: pt coming from home with c/o generalized weakness, sts can't get up without falling, has increased tremors. Per EMS Pt was supposed to go to an appointment with her pcp today, but was too weak ot make it.

## 2017-07-22 NOTE — ED Notes (Signed)
Pt

## 2017-07-22 NOTE — ED Notes (Signed)
Pt slid in to floor and stated that she could not sit up in a wheel chair. She states that the same thing happened at [facility] the other day. She stated that the floor felt better because she could rest her head. With pt's permission, she was assisted in to the recliner in a flat position with rails up. She verbalized understanding that we need to try to stay off the floor.

## 2017-07-23 ENCOUNTER — Encounter (HOSPITAL_COMMUNITY): Payer: Self-pay

## 2017-07-23 ENCOUNTER — Other Ambulatory Visit: Payer: Self-pay

## 2017-07-23 ENCOUNTER — Observation Stay (HOSPITAL_COMMUNITY): Payer: Self-pay

## 2017-07-23 DIAGNOSIS — R55 Syncope and collapse: Secondary | ICD-10-CM

## 2017-07-23 LAB — COMPREHENSIVE METABOLIC PANEL
ALBUMIN: 2.3 g/dL — AB (ref 3.5–5.0)
ALK PHOS: 88 U/L (ref 38–126)
ALT: 8 U/L — ABNORMAL LOW (ref 14–54)
ANION GAP: 8 (ref 5–15)
AST: 16 U/L (ref 15–41)
BUN: 35 mg/dL — AB (ref 6–20)
CALCIUM: 8.9 mg/dL (ref 8.9–10.3)
CO2: 29 mmol/L (ref 22–32)
Chloride: 107 mmol/L (ref 101–111)
Creatinine, Ser: 1.72 mg/dL — ABNORMAL HIGH (ref 0.44–1.00)
GFR calc Af Amer: 37 mL/min — ABNORMAL LOW (ref >60)
GFR calc non Af Amer: 32 mL/min — ABNORMAL LOW (ref >60)
GLUCOSE: 142 mg/dL — AB (ref 65–99)
Potassium: 3.2 mmol/L — ABNORMAL LOW (ref 3.5–5.1)
SODIUM: 144 mmol/L (ref 135–145)
Total Bilirubin: 0.6 mg/dL (ref 0.3–1.2)
Total Protein: 6 g/dL — ABNORMAL LOW (ref 6.5–8.1)

## 2017-07-23 LAB — TROPONIN I
TROPONIN I: 0.03 ng/mL — AB (ref <0.03)
TROPONIN I: 0.03 ng/mL — AB (ref <0.03)
Troponin I: 0.03 ng/mL (ref <0.03)

## 2017-07-23 LAB — CBC
HEMATOCRIT: 30.9 % — AB (ref 36.0–46.0)
Hemoglobin: 9.4 g/dL — ABNORMAL LOW (ref 12.0–15.0)
MCH: 27 pg (ref 26.0–34.0)
MCHC: 30.4 g/dL (ref 30.0–36.0)
MCV: 88.8 fL (ref 78.0–100.0)
Platelets: 227 10*3/uL (ref 150–400)
RBC: 3.48 MIL/uL — ABNORMAL LOW (ref 3.87–5.11)
RDW: 15 % (ref 11.5–15.5)
WBC: 9.6 10*3/uL (ref 4.0–10.5)

## 2017-07-23 LAB — GLUCOSE, CAPILLARY
GLUCOSE-CAPILLARY: 154 mg/dL — AB (ref 65–99)
GLUCOSE-CAPILLARY: 119 mg/dL — AB (ref 65–99)
Glucose-Capillary: 143 mg/dL — ABNORMAL HIGH (ref 65–99)
Glucose-Capillary: 203 mg/dL — ABNORMAL HIGH (ref 65–99)

## 2017-07-23 LAB — IRON AND TIBC
IRON: 37 ug/dL (ref 28–170)
Saturation Ratios: 17 % (ref 10.4–31.8)
TIBC: 224 ug/dL — ABNORMAL LOW (ref 250–450)
UIBC: 187 ug/dL

## 2017-07-23 LAB — FOLATE: FOLATE: 7.7 ng/mL (ref >5.9)

## 2017-07-23 LAB — VITAMIN B12: Vitamin B-12: 564 pg/mL (ref 180–914)

## 2017-07-23 LAB — T4, FREE: FREE T4: 1.2 ng/dL — AB (ref 0.61–1.12)

## 2017-07-23 LAB — SODIUM, URINE, RANDOM
Sodium, Ur: 41 mmol/L
Sodium, Ur: 100 mmol/L

## 2017-07-23 LAB — RETICULOCYTES
RBC.: 3.48 MIL/uL — AB (ref 3.87–5.11)
RETIC COUNT ABSOLUTE: 73.1 10*3/uL (ref 19.0–186.0)
Retic Ct Pct: 2.1 % (ref 0.4–3.1)

## 2017-07-23 LAB — CREATININE, URINE, RANDOM
CREATININE, URINE: 192.56 mg/dL
Creatinine, Urine: 66.29 mg/dL

## 2017-07-23 LAB — FERRITIN: Ferritin: 327 ng/mL — ABNORMAL HIGH (ref 11–307)

## 2017-07-23 LAB — MAGNESIUM: Magnesium: 1.8 mg/dL (ref 1.7–2.4)

## 2017-07-23 LAB — OSMOLALITY, URINE: OSMOLALITY UR: 408 mosm/kg (ref 300–900)

## 2017-07-23 LAB — PHOSPHORUS: Phosphorus: 4.1 mg/dL (ref 2.5–4.6)

## 2017-07-23 MED ORDER — NICOTINE 7 MG/24HR TD PT24
7.0000 mg | MEDICATED_PATCH | Freq: Every day | TRANSDERMAL | Status: DC
  Administered 2017-07-23 – 2017-07-24 (×3): 7 mg via TRANSDERMAL
  Filled 2017-07-23 (×3): qty 1

## 2017-07-23 MED ORDER — PNEUMOCOCCAL VAC POLYVALENT 25 MCG/0.5ML IJ INJ
0.5000 mL | INJECTION | INTRAMUSCULAR | Status: AC
  Administered 2017-07-25: 0.5 mL via INTRAMUSCULAR
  Filled 2017-07-23 (×2): qty 0.5

## 2017-07-23 MED ORDER — SODIUM CHLORIDE 0.9 % IV SOLN
1.0000 g | INTRAVENOUS | Status: DC
  Administered 2017-07-23 – 2017-07-25 (×3): 1 g via INTRAVENOUS
  Filled 2017-07-23 (×3): qty 1

## 2017-07-23 MED ORDER — SODIUM CHLORIDE 0.9 % IV SOLN: 1.0000 g | Freq: Once | INTRAVENOUS | Status: DC

## 2017-07-23 NOTE — Evaluation (Signed)
Physical Therapy Evaluation Patient Details Name: Andrea Glenn MRN: [id number] DOB: 05/25/1960 Today's Date: 07/23/2017   History of Present Illness  (P) pt admit after a fall at home. Pt has DMII and diabetic neuropathy, with admission 9 months ago with pancreatitis.   Clinical Impression  Pt with DMII and recent falls, pt stating she has not been able to do a lot for last few weeks due to her falls. PT states her "tremors" begin in her L arm and then her body shakes and she falls. She states this only happens when she os on her feet , no other times. No noted weakness or tremors noted during history taking or functional mobility, except for 2 times in the hallway while walking holding the RW she began to "shake" however once I talked her through sitting down in the recliner behind hr she was able to control her descent and the shaking was no longer occurring. Will continu to follow her while on acute care, and at this time recommend HHPT for home assessment and safety upon DC.     Follow Up Recommendations Home health PT    Equipment Recommendations  None recommended by PT    Recommendations for Other Services       Precautions / Restrictions Precautions Precautions: (P) Fall Restrictions Weight Bearing Restrictions: (P) No      Mobility  Bed Mobility Overal bed mobility: Modified Independent             General bed mobility comments: HOB elevated and used rail, however supervision to come to bedside   Transfers Overall transfer level: Needs assistance Equipment used: Rolling walker (2 wheeled) Transfers: Sit to/from Stand Sit to Stand: Min guard         General transfer comment: cues for RW safety and use   Ambulation/Gait Ambulation/Gait assistance: Min guard;+2 safety/equipment(+2 for safety due to pt exhibited some unpredictable tremors and needed to be followed by the recliner for safety . ) Ambulation Distance (Feet): 50 Feet(x2) Assistive device: Rolling  walker (2 wheeled) Gait Pattern/deviations: Step-to pattern Gait velocity: slow and steady   General Gait Details: had to sit on recliner at 7feet due to patient showed some "shaking" and thought she was going to fall, so we talked her through it and she just getnly sat in the recliner. she showed this once more less severe during her ambulation and did not require sitting or assisting..  Stairs            Wheelchair Mobility    Modified Rankin (Stroke Patients Only)       Balance Overall balance assessment: Needs assistance(unpredicatble, unsure of cause ) Sitting-balance support: No upper extremity supported;Feet supported Sitting balance-Leahy Scale: Normal     Standing balance support: Bilateral upper extremity supported;During functional activity Standing balance-Leahy Scale: Fair                               Pertinent Vitals/Pain Pain Assessment: (P) 0-10 Pain Score: (P) 3  Pain Location: (P) feet Pain Descriptors / Indicators: (P) Burning Pain Intervention(s): (P) Limited activity within patient's tolerance;Monitored during session;Repositioned    Home Living Family/patient expects to be discharged to:: (P) Private residence Living Arrangements: (P) Other relatives Available Help at Discharge: (P) Family;Available PRN/intermittently Type of Home: (P) House Home Access: (P) Stairs to enter Entrance Stairs-Rails: (P) Right Entrance Stairs-Number of Steps: (P) 6 Home Layout: (P) One level Home Equipment: (P) Walker -  2 wheels Additional Comments: difficult to get patient to describe home enviroment. States her home is on a hill, however I did ask if she could drive up near the door and she stated, "yes" , however she continued to tell me how hard it was going to be to go up and down that hill. She then states there are 6 steps to enter the home.     Prior Function Level of Independence: (P) Independent with assistive device(s)          Comments: (P) difficult historian. Seems patietn was independent and working up until a month or 2 ago when she started having these "tremors" and falling so she laid around a lot., and now using the RW to walk.      Hand Dominance   Dominant Hand: (P) (ambidextrous per patient)    Extremity/Trunk Assessment   Upper Extremity Assessment Upper Extremity Assessment: (P) Overall WFL for tasks assessed(one episode of tremor left UE during session)    Lower Extremity Assessment Lower Extremity Assessment: (P) Defer to PT evaluation       Communication   Communication: (P) No difficulties  Cognition Arousal/Alertness: (P) Awake/alert Behavior During Therapy: (P) WFL for tasks assessed/performed Overall Cognitive Status: (P) Within Functional Limits for tasks assessed                                        General Comments      Exercises     Assessment/Plan    PT Assessment Patient needs continued PT services  PT Problem List Decreased activity tolerance;Decreased balance;Decreased mobility       PT Treatment Interventions Gait training;DME instruction;Stair training;Therapeutic exercise;Therapeutic activities;Functional mobility training    PT Goals (Current goals can be found in the Care Plan section)  Acute Rehab PT Goals Patient Stated Goal: Pt wants use to help with her shaking episodes  PT Goal Formulation: With patient Time For Goal Achievement: 08/06/17 Potential to Achieve Goals: Good    Frequency Min 3X/week   Barriers to discharge        Co-evaluation               AM-PAC PT "6 Clicks" Daily Activity  Outcome Measure Difficulty turning over in bed (including adjusting bedclothes, sheets and blankets)?: None Difficulty moving from lying on back to sitting on the side of the bed? : None Difficulty sitting down on and standing up from a chair with arms (e.g., wheelchair, bedside commode, etc,.)?: A Little Help needed moving to  and from a bed to chair (including a wheelchair)?: A Little Help needed walking in hospital room?: A Little Help needed climbing 3-5 steps with a railing? : A Lot 6 Click Score: 19    End of Session Equipment Utilized During Treatment: Gait belt Activity Tolerance: Patient tolerated treatment well Patient left: in chair;with chair alarm set;with call bell/phone within reach Nurse Communication: Mobility status(please use gait belt and 2 person assist due to pt unpredictable with "shaking" ) PT Visit Diagnosis: Other abnormalities of gait and mobility (R26.89)    Time: 7425-9563 PT Time Calculation (min) (ACUTE ONLY): 34 min   Charges:   PT Evaluation $PT Eval Low Complexity: 1 Low     PT G CodesClide Dales, PT Pager: [phone] 07/23/2017   Tilden Broz, Gatha Mayer 07/23/2017, 2:13 PM

## 2017-07-23 NOTE — Progress Notes (Signed)
PROGRESS NOTE [facility]   Andrea Glenn   YQM:[id number] DOB: 1960-05-28  DOA: 07/22/2017 PCP: Haena Bing, DO   Brief Narrative:  Andrea Glenn 57 year old female with medical history significant for type 2 diabetes mellitus with peripheral neuropathy, hypertension and diastolic CHF presented to the emergency department complaining of unsteady gait and tremor.  Patient attributed her issues to discontinue Neurontin, however she reports that after starting Neurontin she began to feel weaker.  Patient was seen in the ER 3 days prior to admission and diagnosed with UTI urine cultures were positive for E. coli and Streptococcus gallolyticus.  She was prescribed Keflex which she has been taking only 3 doses.  Patient patient was found to have elevated creatinine and she was admitted for working diagnosis of acute renal failure and tremors.   Subjective: Patient seen and examined, she reported that her tremors are mainly when she is standing.  She feels that her legs keep up and is unable to walk.  This is started about a month ago and has progressively worsened.  She also noted tremors on her left arm mainly  there are on and off.  There is no other associated symptoms with this tremors, she denies loss of consciousness, urinary and fecal incontinence and full body shaking.  Assessment & Plan: Presyncope In setting of dehydration, and partially treated UTI.  Orthostatic hypotension probably from HCTZ Patient never had consciousness, positive orthostasis Continue IV fluid Carotid Dopplers negative, she had an echocardiogram done in 05/2017 which showed normal LVEF with grade 2 diastolic dysfunction Continue to monitor on telemetry   Upper and lower extremity tremors On exam tremors were not present, per physical therapist she started being tremulous on her lower extremity while walking. ?  If intentional.  Given history will obtain EEG to rule out partial seizure  AKI on CKD  stage II Baseline creatinine around 1.3 Continue IV fluid Obtain urine lites and creatinine Will check renal ultrasound Monitor renal function in a.m.  UTI Cultures from 07/19/17 were positive for E. coli and Streptococcus gallolyticus. Patient was noncompliant with medication only took 3 doses of Keflex, partially treated Agree with Rocephin for now  Diabetes mellitus type 2 SSI Monitor CBGs  Hypertension BP stable Holding HCTZ due to orthostatic hypotension.  May need to switch to amlodipine  Ankle pain/swelling Will check x-ray, denies trauma  DVT prophylaxis: Lovenox Code Status: Full code Family Communication: None at bedside Disposition Plan: Home in the next 24-48 hours  Consultants:   None  Procedures:   None  Antimicrobials:  Rocephin 4/5 >>   Objective: Vitals:   07/22/17 2200 07/22/17 2334 07/23/17 0531 07/23/17 0844  BP: 100/83 (!) 170/85 116/60 (!) 156/78  Pulse: 88 92 85   Resp: 14 16 16    Temp:  98.5 F (36.9 C) 98.3 F (36.8 C)   TempSrc:  Oral Oral   SpO2: 100% 100% 99%   Height:    5\' 4"  (1.626 m)    Intake/Output Summary (Last 24 hours) at 07/23/2017 1459 Last data filed at 07/23/2017 0900 Gross per 24 hour  Intake 1613.75 ml  Output -  Net 1613.75 ml   There were no vitals filed for this visit.  Examination:  General exam: NAD HEENT: OP moist and clear Respiratory system: Clear to auscultation. No wheezes,crackle or rhonchi Cardiovascular system: S1 & S2 heard, RRR. No JVD, murmurs, rubs or gallops Gastrointestinal system: Abdomen is nondistended, soft and nontender.  Central nervous system: Alert and  oriented. No focal neurological deficits.  Strength 5/5 Extremities: Left lower ankle swelling, tender on lateral malleoli and dorsal aspect.  Pulses intact right lower extremity normal Skin: No rashes, lesions or ulcers Psychiatry: Very tangential, mood anxious   Data Reviewed: I have personally reviewed following labs and  imaging studies  CBC: Recent Labs  Lab 07/18/17 1734 07/22/17 1745 07/23/17 0523  WBC 10.2 10.0 9.6  NEUTROABS  --  5.8  --   HGB 10.4* 10.2* 9.4*  HCT 33.1* 32.6* 30.9*  MCV 86.6 88.1 88.8  PLT 253 253 371   Basic Metabolic Panel: Recent Labs  Lab 07/18/17 1734 07/22/17 1745 07/23/17 0523  NA 136 142 144  K 3.7 3.8 3.2*  CL 101 104 107  CO2 25 31 29   GLUCOSE 342* 174* 142*  BUN 28* 33* 35*  CREATININE 1.60* 1.62* 1.72*  CALCIUM 9.3 9.5 8.9  MG  --  1.9 1.8  PHOS  --  4.1 4.1   GFR: Estimated Creatinine Clearance: 30.8 mL/min (A) (by C-G formula based on SCr of 1.72 mg/dL (H)). Liver Function Tests: Recent Labs  Lab 07/18/17 1734 07/22/17 1745 07/23/17 0523  AST 16 20 16   ALT 8* 8* 8*  ALKPHOS 121 105 88  BILITOT 0.6 0.7 0.6  PROT 6.9 7.1 6.0*  ALBUMIN 2.8* 2.9* 2.3*   Recent Labs  Lab 07/22/17 1745  LIPASE 30   No results for input(s): AMMONIA in the last 168 hours. Coagulation Profile: No results for input(s): INR, PROTIME in the last 168 hours. Cardiac Enzymes: Recent Labs  Lab 07/22/17 1745 07/22/17 2338 07/23/17 0523 07/23/17 1225  CKTOTAL 92  --   --   --   TROPONINI  --  0.03* 0.03* 0.03*   BNP (last 3 results) No results for input(s): PROBNP in the last 8760 hours. HbA1C: No results for input(s): HGBA1C in the last 72 hours. CBG: Recent Labs  Lab 07/23/17 0715 07/23/17 1144  GLUCAP 154* 143*   Lipid Profile: No results for input(s): CHOL, HDL, LDLCALC, TRIG, CHOLHDL, LDLDIRECT in the last 72 hours. Thyroid Function Tests: Recent Labs    07/22/17 1745 07/22/17 2338  TSH 0.391  --   FREET4  --  1.20*   Anemia Panel: Recent Labs    07/23/17 0523  VITAMINB12 564  FOLATE 7.7  FERRITIN 327*  TIBC 224*  IRON 37  RETICCTPCT 2.1   Sepsis Labs: No results for input(s): PROCALCITON, LATICACIDVEN in the last 168 hours.  Recent Results (from the past 240 hour(s))  Urine culture     Status: Abnormal   Collection Time:  07/19/17 12:09 AM  Result Value Ref Range Status   Specimen Description URINE, CLEAN CATCH  Final   Special Requests   Final    NONE Performed at [facility], [address], [place], [place] [address]    Culture (A)  Final    >=100,000 COLONIES/mL ESCHERICHIA COLI >=100,000 COLONIES/mL STREPTOCOCCUS GALLOLYTICUS    Report Status 07/22/2017 FINAL  Final   Organism ID, Bacteria ESCHERICHIA COLI (A)  Final   Organism ID, Bacteria STREPTOCOCCUS GALLOLYTICUS (A)  Final      Susceptibility   Escherichia coli - MIC*    AMPICILLIN <=2 SENSITIVE Sensitive     CEFAZOLIN <=4 SENSITIVE Sensitive     CEFTRIAXONE <=1 SENSITIVE Sensitive     CIPROFLOXACIN <=0.25 SENSITIVE Sensitive     GENTAMICIN <=1 SENSITIVE Sensitive     IMIPENEM <=0.25 SENSITIVE Sensitive  NITROFURANTOIN <=16 SENSITIVE Sensitive     TRIMETH/SULFA <=20 SENSITIVE Sensitive     AMPICILLIN/SULBACTAM <=2 SENSITIVE Sensitive     PIP/TAZO <=4 SENSITIVE Sensitive     Extended ESBL NEGATIVE Sensitive     * >=100,000 COLONIES/mL ESCHERICHIA COLI   Streptococcus gallolyticus - MIC*    PENICILLIN 0.12 SENSITIVE Sensitive     CEFTRIAXONE <=0.12 SENSITIVE Sensitive     ERYTHROMYCIN <=0.12 SENSITIVE Sensitive     LEVOFLOXACIN 4 INTERMEDIATE Intermediate     VANCOMYCIN 0.25 SENSITIVE Sensitive     * >=100,000 COLONIES/mL STREPTOCOCCUS GALLOLYTICUS  Blood culture (routine x 2)     Status: None (Preliminary result)   Collection Time: 07/22/17  5:45 PM  Result Value Ref Range Status   Specimen Description   Final    BLOOD LEFT ANTECUBITAL Performed at [facility] [address], [place], [place] [address]    Special Requests   Final    BOTTLES DRAWN AEROBIC AND ANAEROBIC Blood Culture results may not be optimal due to an excessive volume of blood received in culture bottles Performed at [facility] [address], [place], [place] [address]    Culture   Final    NO GROWTH < 24  HOURS Performed at [facility] [address] [place], [place] [address]    Report Status PENDING  Incomplete  Blood culture (routine x 2)     Status: None (Preliminary result)   Collection Time: 07/22/17  6:07 PM  Result Value Ref Range Status   Specimen Description   Final    BLOOD LEFT HAND Performed at [facility] [address], [place], [place] [address]    Special Requests   Final    BOTTLES DRAWN AEROBIC AND ANAEROBIC Blood Culture adequate volume Performed at [facility] [address] [place], [place] [address]    Culture   Final    NO GROWTH < 24 HOURS Performed at [facility] [address] [place], [place] [address]    Report Status PENDING  Incomplete      Radiology Studies: Dg Abd 1 View  Result Date: 07/22/2017 CLINICAL DATA:  Weakness and vomiting. EXAM: ABDOMEN - 1 VIEW COMPARISON:  CT abdomen pelvis dated October 07, 2016. FINDINGS: The bowel gas pattern is normal. No radio-opaque calculi or other significant radiographic abnormality are seen. IMPRESSION: Negative. Electronically Signed   By: Titus Dubin M.D.   On: 07/22/2017 23:11   Ct Head Wo Contrast  Result Date: 07/22/2017 CLINICAL DATA:  Generalized weakness with increasing tremors. Acute headache. EXAM: CT HEAD WITHOUT CONTRAST TECHNIQUE: Contiguous axial images were obtained from the base of the skull through the vertex without intravenous contrast. COMPARISON:  None. FINDINGS: BRAIN: The ventricles and sulci are normal. No intraparenchymal hemorrhage, mass effect nor midline shift. No acute large vascular territory infarcts. Grey-white matter distinction is maintained. The basal ganglia are unremarkable. No abnormal extra-axial fluid collections. Basal cisterns are not effaced and midline. The brainstem and cerebellar hemispheres are without acute abnormalities. VASCULAR: Unremarkable. SKULL/SOFT TISSUES: No skull fracture. No significant soft tissue  swelling. ORBITS/SINUSES: The included ocular globes and orbital contents are normal.The mastoid air cells are clear. The included paranasal sinuses are well-aerated. OTHER: None. IMPRESSION: Normal appearing noncontrast head CT. Electronically Signed   By: Ashley Royalty M.D.   On: 07/22/2017 19:50      Scheduled Meds: . enoxaparin (LOVENOX) injection  30 mg Subcutaneous QHS  .  insulin aspart  0-5 Units Subcutaneous QHS  . insulin aspart  0-9 Units Subcutaneous TID WC  . nicotine  7 mg Transdermal Daily  . [START ON 07/24/2017] pneumococcal 23 valent vaccine  0.5 mL Intramuscular Tomorrow-1000   Continuous Infusions: . cefTRIAXone (ROCEPHIN)  IV       LOS: 0 days    Time spent: Total of 25 minutes spent with pt, greater than 50% of which was spent in discussion of  treatment, counseling and coordination of care   Chipper Oman, MD Pager: Text Page via [email or web address]   If 7PM-7AM, please contact night-coverage [email or web address] 07/23/2017, 2:59 PM   Note - This record has been created using [employer]. Chart creation errors have been sought, but may not always have been located. Such creation errors do not reflect on the standard of medical care.

## 2017-07-23 NOTE — Progress Notes (Signed)
Occupational Therapy Evaluation Patient Details Name: Andrea Glenn MRN: [id number] DOB: 11/27/1960 Today's Date: 07/23/2017    History of Present Illness pt admit after a fall at home. Pt has DMII and diabetic neuropathy, with admission 9 months ago with pancreatitis.    Clinical Impression   Patient presents to OT with decreased ADL independence and safety. Will benefit from acute OT to facilitate safe discharge. OT will follow.    Follow Up Recommendations  Home health OT;Supervision/Assistance - 24 hour    Equipment Recommendations  3 in 1 bedside commode    Recommendations for Other Services       Precautions / Restrictions Precautions Precautions: Fall Restrictions Weight Bearing Restrictions: No      Mobility Bed Mobility Overal bed mobility: Modified Independent             General bed mobility comments: sit to supine  Transfers Overall transfer level: Needs assistance Equipment used: Rolling walker (2 wheeled) Transfers: Sit to/from Stand Sit to Stand: Min guard;Min assist         General transfer comment: cues for RW safety and use     Balance                                      ADL either performed or assessed with clinical judgement   ADL Overall ADL's : Needs assistance/impaired Eating/Feeding: Independent   Grooming: Set up;Sitting                   Toilet Transfer: Minimal assistance;BSC;RW   Toileting- Clothing Manipulation and Hygiene: Moderate assistance       Functional mobility during ADLs: Minimal assistance;Rolling walker General ADL Comments: Patient on phone, yelling at family member upon entry. Patient requested to go back to bed due to discomfort in the recliner. Patient declined needing to toilet because nurses put bedpan under her earlier while she was in the recliner. Pad smelled like urine. Patient had one episode of left UE tremor during session upon standing but it only lasted a few  seconds. Back to bed and comfortable with bed alarm set. Patient requested to rest and did not want to perform further activity.     Vision         Perception     Praxis      Pertinent Vitals/Pain Pain Assessment: 0-10 Pain Score: 3  Pain Location: feet Pain Descriptors / Indicators: Burning Pain Intervention(s): Limited activity within patient's tolerance;Monitored during session;Repositioned     Hand Dominance (ambidextrous per patient)   Extremity/Trunk Assessment Upper Extremity Assessment Upper Extremity Assessment: Overall WFL for tasks assessed(one episode of tremor left UE during session)   Lower Extremity Assessment Lower Extremity Assessment: Defer to PT evaluation       Communication Communication Communication: No difficulties   Cognition Arousal/Alertness: Awake/alert Behavior During Therapy: WFL for tasks assessed/performed Overall Cognitive Status: Within Functional Limits for tasks assessed                                     General Comments       Exercises     Shoulder Instructions      Home Living Family/patient expects to be discharged to:: Private residence Living Arrangements: Other relatives Available Help at Discharge: Family;Available PRN/intermittently Type of Home: House Home Access: Stairs to enter Entrance  Stairs-Number of Steps: 6 Entrance Stairs-Rails: Right Home Layout: One level     Bathroom Shower/Tub: Occupational psychologist: Standard     Home Equipment: Environmental consultant - 2 wheels         Prior Functioning/Environment Level of Independence: Independent with assistive device(s)        Comments: difficult historian. Seems patietn was independent and working up until a month or 2 ago when she started having these "tremors" and falling so she laid around a lot., and now using the RW to walk.         OT Problem List: Decreased strength;Decreased activity tolerance;Impaired balance (sitting and/or  standing);Decreased safety awareness;Decreased knowledge of use of DME or AE;Impaired UE functional use      OT Treatment/Interventions: Self-care/ADL training;DME and/or AE instruction;Therapeutic activities;Patient/family education    OT Goals(Current goals can be found in the care plan section) Acute Rehab OT Goals Patient Stated Goal: Pt wants use to help with her shaking episodes  OT Goal Formulation: With patient Time For Goal Achievement: 08/06/17 Potential to Achieve Goals: Good  OT Frequency: Min 2X/week   Barriers to D/C:            Co-evaluation              AM-PAC PT "6 Clicks" Daily Activity     Outcome Measure Help from another person eating meals?: None Help from another person taking care of personal grooming?: A Little Help from another person toileting, which includes using toliet, bedpan, or urinal?: A Little Help from another person bathing (including washing, rinsing, drying)?: A Lot Help from another person to put on and taking off regular upper body clothing?: A Little Help from another person to put on and taking off regular lower body clothing?: A Lot 6 Click Score: 17   End of Session Equipment Utilized During Treatment: Rolling walker  Activity Tolerance: Patient tolerated treatment well Patient left: in bed;with call bell/phone within reach;with bed alarm set  OT Visit Diagnosis: Unsteadiness on feet (R26.81);Repeated falls (R29.6)                Time: 1320-1340 OT Time Calculation (min): 20 min Charges:  OT General Charges $OT Visit: 1 Visit OT Evaluation $OT Eval Low Complexity: 1 Low G-Codes:       Lilie Vezina A Mandel Seiden 07-27-2017, 2:18 PM

## 2017-07-23 NOTE — Progress Notes (Signed)
Carotid artery duplex has been completed. 1-39% ICA stenosis bilaterally.  07/23/17 8:34 AM Andrea Glenn RVT

## 2017-07-23 NOTE — Progress Notes (Signed)
Paged doctor about critical lab result Troponin.

## 2017-07-23 NOTE — Plan of Care (Signed)

## 2017-07-24 ENCOUNTER — Inpatient Hospital Stay (HOSPITAL_COMMUNITY): Payer: Self-pay

## 2017-07-24 LAB — GLUCOSE, CAPILLARY
GLUCOSE-CAPILLARY: 100 mg/dL — AB (ref 65–99)
GLUCOSE-CAPILLARY: 136 mg/dL — AB (ref 65–99)
GLUCOSE-CAPILLARY: 112 mg/dL — AB (ref 65–99)
Glucose-Capillary: 117 mg/dL — ABNORMAL HIGH (ref 65–99)

## 2017-07-24 LAB — BASIC METABOLIC PANEL
ANION GAP: 5 (ref 5–15)
BUN: 23 mg/dL — AB (ref 6–20)
CO2: 29 mmol/L (ref 22–32)
Calcium: 8.9 mg/dL (ref 8.9–10.3)
Chloride: 110 mmol/L (ref 101–111)
Creatinine, Ser: 1.17 mg/dL — ABNORMAL HIGH (ref 0.44–1.00)
GFR calc Af Amer: 58 mL/min — ABNORMAL LOW (ref >60)
GFR, EST NON AFRICAN AMERICAN: 50 mL/min — AB (ref >60)
GLUCOSE: 110 mg/dL — AB (ref 65–99)
POTASSIUM: 4.1 mmol/L (ref 3.5–5.1)
Sodium: 144 mmol/L (ref 135–145)

## 2017-07-24 LAB — MAGNESIUM: Magnesium: 1.6 mg/dL — ABNORMAL LOW (ref 1.7–2.4)

## 2017-07-24 MED ORDER — GLUCERNA SHAKE PO LIQD
237.0000 mL | Freq: Three times a day (TID) | ORAL | Status: DC | PRN
  Filled 2017-07-24: qty 237

## 2017-07-24 MED ORDER — AMLODIPINE BESYLATE 10 MG PO TABS
10.0000 mg | ORAL_TABLET | Freq: Every day | ORAL | Status: DC
  Administered 2017-07-25: 10 mg via ORAL
  Filled 2017-07-24: qty 1

## 2017-07-24 MED ORDER — ENOXAPARIN SODIUM 40 MG/0.4ML ~~LOC~~ SOLN
40.0000 mg | Freq: Every day | SUBCUTANEOUS | Status: DC
  Administered 2017-07-24: 40 mg via SUBCUTANEOUS
  Filled 2017-07-24: qty 0.4

## 2017-07-24 MED ORDER — HYDRALAZINE HCL 20 MG/ML IJ SOLN: 5.0000 mg | Freq: Four times a day (QID) | INTRAMUSCULAR | Status: DC | PRN

## 2017-07-24 MED ORDER — AMLODIPINE BESYLATE 5 MG PO TABS
5.0000 mg | ORAL_TABLET | Freq: Every day | ORAL | Status: DC
  Administered 2017-07-24: 5 mg via ORAL
  Filled 2017-07-24: qty 1

## 2017-07-24 NOTE — Progress Notes (Signed)
Patient's BP 171/92, on call provider notified.

## 2017-07-24 NOTE — Progress Notes (Signed)
Initial Nutrition Assessment  INTERVENTION:   Provide Glucerna Shake po PRN (if patient is unable to eat meals), each supplement provides 220 kcal and 10 grams of protein  NUTRITION DIAGNOSIS:   Inadequate oral intake related to nausea, vomiting as evidenced by per patient/family report.  GOAL:   Patient will meet greater than or equal to 90% of their needs  MONITOR:   PO intake, Supplement acceptance, Labs, Weight trends, I & O's  REASON FOR ASSESSMENT:   Consult Assessment of nutrition requirement/status  ASSESSMENT:   57 year old female with medical history significant for type 2 diabetes mellitus with peripheral neuropathy, hypertension and diastolic CHF presented to the emergency department complaining of unsteady gait and tremor.   Pt currently consuming 100% of meals (averaging 500-600 kcal and 20-35g protein each). Pt reports having episodes of vomiting since 4/2. Continues to have some nausea but may be associated with constipation.  Per chart review, pt has lost 28 lb since 2/21 (19% wt loss x 1.5 months, significant for time frame). However, pt with history of CHF and was placed on Lasix in February. It is unclear if this is fluid loss. Pt did report she gained weight ~15 lb in February from fluid. 123 lb is most likely patient's baseline.   Medications reviewed. Labs reviewed: CBGs: 100-117   NUTRITION - FOCUSED PHYSICAL EXAM:  Nutrition focused physical exam shows no sign of depletion of muscle mass or body fat.  Diet Order:  Diet Carb Modified Fluid consistency: Thin; Room service appropriate? Yes  EDUCATION NEEDS:   Not appropriate for education at this time  Skin:  Skin Assessment: Reviewed RN Assessment  Last BM:  4/3  Height:   Ht Readings from Last 1 Encounters:  07/23/17 5\' 4"  (1.626 m)    Weight:   Wt Readings from Last 1 Encounters:  07/23/17 123 lb 7.3 oz (56 kg)    Ideal Body Weight:  54.5 kg  BMI:  Body mass index is 21.19  kg/m.  Estimated Nutritional Needs:   Kcal:  1400-1600  Protein:  60-70g  Fluid:  1.6L/day  Clayton Bibles, MS, RD, LDN [place] Dietitian Pager: [phone] After Hours Pager: [phone]

## 2017-07-24 NOTE — Progress Notes (Signed)
Paged EEG at 1200 and 1555 . No call back

## 2017-07-24 NOTE — Progress Notes (Signed)
PROGRESS NOTE [facility]   [name]   KGY:[id number] DOB: 1960/12/26  DOA: 07/22/2017 PCP: Chilili Bing, DO   Brief Narrative:  Andrea Glenn 57 year old female with medical history significant for type 2 diabetes mellitus with peripheral neuropathy, hypertension and diastolic CHF presented to the emergency department complaining of unsteady gait and tremor.  Patient attributed her issues to discontinue Neurontin, however she reports that after starting Neurontin she began to feel weaker.  Patient was seen in the ER 3 days prior to admission and diagnosed with UTI urine cultures were positive for E. coli and Streptococcus gallolyticus.  She was prescribed Keflex which she has been taking only 3 doses.  Patient patient was found to have elevated creatinine and she was admitted for working diagnosis of acute renal failure and tremors.   Subjective: Patient seen and examined, she report that shakiness is better comes and go she is able to reproduce the symptoms. Also concern of R ankle swelling for many months, ankle xray negative. BP was elevated overnight.   Assessment & Plan: Presyncope In setting of dehydration, and partially treated UTI.  Orthostatic hypotension probably from HCTZ Patient never had consciousness, positive orthostasis Patient drinking and eating well, d/c IVF Cr back to baseline  Carotid Dopplers negative, she had an echocardiogram done in 05/2017 which showed normal LVEF with grade 2 diastolic dysfunction No events on tele monitor can d/c now   Upper and lower extremity tremors Patient reproduce her tremors with intention, wondering if this is conversion disorder. Patient very tangential. When evaluated by physical therapist she started being tremulous on her lower extremity while walking. Pending EEG to r/o partial seizures.   AKI on CKD stage II Baseline creatinine around 1.3 Cr at baseline, d/c IVF, BP elevated  Urine lytes  - FeNa c/w prerenal  azotemia  [place] pending   UTI Cultures from 07/19/17 were positive for E. coli and Streptococcus gallolyticus. Patient was noncompliant with medication only took 3 doses of Keflex, partially treated Continue Rocephin while inpatient will d/c on Omnicef to complete total of 7 days.   Diabetes mellitus type 2 CBGs stable  Continue SSI   Hypertension BP elevated, due to stopping HCTZ and aggressive IVF  Start Amlodipine   L Ankle pain/swelling No fracture   DVT prophylaxis: Lovenox Code Status: Full code Family Communication: None at bedside Disposition Plan: Home in AM if EEG negative   Consultants:   None  Procedures:   None  Antimicrobials:  Rocephin 4/5 >>   Objective: Vitals:   07/23/17 1700 07/23/17 2047 07/24/17 0545 07/24/17 1307  BP:  (!) 182/86 (!) 171/92 (!) 177/81  Pulse:  65 82 77  Resp:  18 18 16   Temp:  98.1 F (36.7 C) 97.9 F (36.6 C) (!) 97.5 F (36.4 C)  TempSrc:  Oral Oral Oral  SpO2:  98% 100% 100%  Weight: 56 kg (123 lb 7.3 oz)     Height:        Intake/Output Summary (Last 24 hours) at 07/24/2017 1610 Last data filed at 07/24/2017 0600 Gross per 24 hour  Intake 1480 ml  Output -  Net 1480 ml   Filed Weights   07/23/17 1700  Weight: 56 kg (123 lb 7.3 oz)    Examination:  General: Pt is alert, awake, not in acute distress Cardiovascular: RRR, S1/S2 +, no rubs, no gallops Respiratory: CTA bilaterally, no wheezing, no rhonchi Abdominal: Soft, NT, ND, Extremities: L ankle swelling non pitting. Non tender  today. Pulses intact. Reproducible tremor on left LE.  Psych: Tangential and anxious   Data Reviewed: I have personally reviewed following labs and imaging studies  CBC: Recent Labs  Lab 07/18/17 1734 07/22/17 1745 07/23/17 0523  WBC 10.2 10.0 9.6  NEUTROABS  --  5.8  --   HGB 10.4* 10.2* 9.4*  HCT 33.1* 32.6* 30.9*  MCV 86.6 88.1 88.8  PLT 253 253 256   Basic Metabolic Panel: Recent Labs  Lab 07/18/17 1734  07/22/17 1745 07/23/17 0523 07/24/17 0519  NA 136 142 144 144  K 3.7 3.8 3.2* 4.1  CL 101 104 107 110  CO2 25 31 29 29   GLUCOSE 342* 174* 142* 110*  BUN 28* 33* 35* 23*  CREATININE 1.60* 1.62* 1.72* 1.17*  CALCIUM 9.3 9.5 8.9 8.9  MG  --  1.9 1.8 1.6*  PHOS  --  4.1 4.1  --    GFR: Estimated Creatinine Clearance: 45.3 mL/min (A) (by C-G formula based on SCr of 1.17 mg/dL (H)). Liver Function Tests: Recent Labs  Lab 07/18/17 1734 07/22/17 1745 07/23/17 0523  AST 16 20 16   ALT 8* 8* 8*  ALKPHOS 121 105 88  BILITOT 0.6 0.7 0.6  PROT 6.9 7.1 6.0*  ALBUMIN 2.8* 2.9* 2.3*   Recent Labs  Lab 07/22/17 1745  LIPASE 30   No results for input(s): AMMONIA in the last 168 hours. Coagulation Profile: No results for input(s): INR, PROTIME in the last 168 hours. Cardiac Enzymes: Recent Labs  Lab 07/22/17 1745 07/22/17 2338 07/23/17 0523 07/23/17 1225  CKTOTAL 92  --   --   --   TROPONINI  --  0.03* 0.03* 0.03*   BNP (last 3 results) No results for input(s): PROBNP in the last 8760 hours. HbA1C: No results for input(s): HGBA1C in the last 72 hours. CBG: Recent Labs  Lab 07/23/17 1144 07/23/17 1633 07/23/17 2106 07/24/17 0727 07/24/17 1131  GLUCAP 143* 119* 203* 100* 117*   Lipid Profile: No results for input(s): CHOL, HDL, LDLCALC, TRIG, CHOLHDL, LDLDIRECT in the last 72 hours. Thyroid Function Tests: Recent Labs    07/22/17 1745 07/22/17 2338  TSH 0.391  --   FREET4  --  1.20*   Anemia Panel: Recent Labs    07/23/17 0523  VITAMINB12 564  FOLATE 7.7  FERRITIN 327*  TIBC 224*  IRON 37  RETICCTPCT 2.1   Sepsis Labs: No results for input(s): PROCALCITON, LATICACIDVEN in the last 168 hours.  Recent Results (from the past 240 hour(s))  Urine culture     Status: Abnormal   Collection Time: 07/19/17 12:09 AM  Result Value Ref Range Status   Specimen Description URINE, CLEAN CATCH  Final   Special Requests   Final    NONE Performed at [facility], [address] [place], [place] [address]    Culture (A)  Final    >=100,000 COLONIES/mL ESCHERICHIA COLI >=100,000 COLONIES/mL STREPTOCOCCUS GALLOLYTICUS    Report Status 07/22/2017 FINAL  Final   Organism ID, Bacteria ESCHERICHIA COLI (A)  Final   Organism ID, Bacteria STREPTOCOCCUS GALLOLYTICUS (A)  Final      Susceptibility   Escherichia coli - MIC*    AMPICILLIN <=2 SENSITIVE Sensitive     CEFAZOLIN <=4 SENSITIVE Sensitive     CEFTRIAXONE <=1 SENSITIVE Sensitive     CIPROFLOXACIN <=0.25 SENSITIVE Sensitive     GENTAMICIN <=1 SENSITIVE Sensitive     IMIPENEM <=0.25 SENSITIVE Sensitive     NITROFURANTOIN <=16  SENSITIVE Sensitive     TRIMETH/SULFA <=20 SENSITIVE Sensitive     AMPICILLIN/SULBACTAM <=2 SENSITIVE Sensitive     PIP/TAZO <=4 SENSITIVE Sensitive     Extended ESBL NEGATIVE Sensitive     * >=100,000 COLONIES/mL ESCHERICHIA COLI   Streptococcus gallolyticus - MIC*    PENICILLIN 0.12 SENSITIVE Sensitive     CEFTRIAXONE <=0.12 SENSITIVE Sensitive     ERYTHROMYCIN <=0.12 SENSITIVE Sensitive     LEVOFLOXACIN 4 INTERMEDIATE Intermediate     VANCOMYCIN 0.25 SENSITIVE Sensitive     * >=100,000 COLONIES/mL STREPTOCOCCUS GALLOLYTICUS  Blood culture (routine x 2)     Status: None (Preliminary result)   Collection Time: 07/22/17  5:45 PM  Result Value Ref Range Status   Specimen Description   Final    BLOOD LEFT ANTECUBITAL Performed at [facility] [address] [place], [place] [address]    Special Requests   Final    BOTTLES DRAWN AEROBIC AND ANAEROBIC Blood Culture results may not be optimal due to an excessive volume of blood received in culture bottles Performed at [facility] [address] [place], [place] [address]    Culture   Final    NO GROWTH 2 DAYS Performed at [facility] [address], [place], [place] [address]    Report Status PENDING  Incomplete  Blood culture (routine x 2)     Status: None  (Preliminary result)   Collection Time: 07/22/17  6:07 PM  Result Value Ref Range Status   Specimen Description   Final    BLOOD LEFT HAND Performed at [facility] [address] [place], [place] [address]    Special Requests   Final    BOTTLES DRAWN AEROBIC AND ANAEROBIC Blood Culture adequate volume Performed at [facility] [address] [place], [place] [address]    Culture   Final    NO GROWTH 2 DAYS Performed at [facility] [address], [place], [place] [address]    Report Status PENDING  Incomplete     Radiology Studies: Dg Ankle 2 Views Left  Result Date: 07/23/2017 CLINICAL DATA:  Left ankle and foot pain for 2 weeks.  No injury. EXAM: LEFT ANKLE - 2 VIEW COMPARISON:  None. FINDINGS: No fracture.  No bone lesion. The ankle joint is normally spaced and aligned. No arthropathic changes. Soft tissues are unremarkable. IMPRESSION: Negative. Electronically Signed   By: Lajean Manes M.D.   On: 07/23/2017 17:48   Dg Abd 1 View  Result Date: 07/22/2017 CLINICAL DATA:  Weakness and vomiting. EXAM: ABDOMEN - 1 VIEW COMPARISON:  CT abdomen pelvis dated October 07, 2016. FINDINGS: The bowel gas pattern is normal. No radio-opaque calculi or other significant radiographic abnormality are seen. IMPRESSION: Negative. Electronically Signed   By: Titus Dubin M.D.   On: 07/22/2017 23:11   Ct Head Wo Contrast  Result Date: 07/22/2017 CLINICAL DATA:  Generalized weakness with increasing tremors. Acute headache. EXAM: CT HEAD WITHOUT CONTRAST TECHNIQUE: Contiguous axial images were obtained from the base of the skull through the vertex without intravenous contrast. COMPARISON:  None. FINDINGS: BRAIN: The ventricles and sulci are normal. No intraparenchymal hemorrhage, mass effect nor midline shift. No acute large vascular territory infarcts. Grey-white matter distinction is maintained. The basal ganglia are unremarkable. No abnormal extra-axial  fluid collections. Basal cisterns are not effaced and midline. The brainstem and cerebellar hemispheres are without acute abnormalities. VASCULAR: Unremarkable. SKULL/SOFT TISSUES: No  skull fracture. No significant soft tissue swelling. ORBITS/SINUSES: The included ocular globes and orbital contents are normal.The mastoid air cells are clear. The included paranasal sinuses are well-aerated. OTHER: None. IMPRESSION: Normal appearing noncontrast head CT. Electronically Signed   By: Ashley Royalty M.D.   On: 07/22/2017 19:50    Scheduled Meds: . [START ON 07/25/2017] amLODipine  10 mg Oral Daily  . enoxaparin (LOVENOX) injection  40 mg Subcutaneous QHS  . insulin aspart  0-5 Units Subcutaneous QHS  . insulin aspart  0-9 Units Subcutaneous TID WC  . nicotine  7 mg Transdermal Daily  . pneumococcal 23 valent vaccine  0.5 mL Intramuscular Tomorrow-1000   Continuous Infusions: . cefTRIAXone (ROCEPHIN)  IV 1 g (07/23/17 1801)     LOS: 1 day    Time spent: Total of 25 minutes spent with pt, greater than 50% of which was spent in discussion of  treatment, counseling and coordination of care  Chipper Oman, MD Pager: Text Page via [email or web address]   If 7PM-7AM, please contact night-coverage [email or web address] 07/24/2017, 4:10 PM   Note - This record has been created using [employer]. Chart creation errors have been sought, but may not always have been located. Such creation errors do not reflect on the standard of medical care.

## 2017-07-24 NOTE — Progress Notes (Signed)
Educated pt on Pneumovax. Pt refused

## 2017-07-25 ENCOUNTER — Inpatient Hospital Stay (HOSPITAL_COMMUNITY): Payer: Self-pay

## 2017-07-25 ENCOUNTER — Inpatient Hospital Stay (HOSPITAL_COMMUNITY)
Admit: 2017-07-25 | Discharge: 2017-07-25 | Disposition: A | Discharge status: Home / Self Care | Payer: Self-pay | Attending: Family Medicine | Admitting: Family Medicine

## 2017-07-25 DIAGNOSIS — E1142 Type 2 diabetes mellitus with diabetic polyneuropathy: Secondary | ICD-10-CM

## 2017-07-25 DIAGNOSIS — M25572 Pain in left ankle and joints of left foot: Secondary | ICD-10-CM

## 2017-07-25 DIAGNOSIS — R251 Tremor, unspecified: Secondary | ICD-10-CM | POA: Insufficient documentation

## 2017-07-25 DIAGNOSIS — E1165 Type 2 diabetes mellitus with hyperglycemia: Secondary | ICD-10-CM

## 2017-07-25 DIAGNOSIS — N179 Acute kidney failure, unspecified: Principal | ICD-10-CM

## 2017-07-25 DIAGNOSIS — D638 Anemia in other chronic diseases classified elsewhere: Secondary | ICD-10-CM

## 2017-07-25 DIAGNOSIS — N3001 Acute cystitis with hematuria: Secondary | ICD-10-CM

## 2017-07-25 LAB — PROTEIN ELECTROPHORESIS, SERUM
A/G Ratio: 0.7 (ref 0.7–1.7)
Albumin ELP: 2.3 g/dL — ABNORMAL LOW (ref 2.9–4.4)
Alpha-1-Globulin: 0.2 g/dL (ref 0.0–0.4)
Alpha-2-Globulin: 0.8 g/dL (ref 0.4–1.0)
Beta Globulin: 0.8 g/dL (ref 0.7–1.3)
GLOBULIN, TOTAL: 3.1 g/dL (ref 2.2–3.9)
Gamma Globulin: 1.3 g/dL (ref 0.4–1.8)
M-Spike, %: 0.3 g/dL — ABNORMAL HIGH
TOTAL PROTEIN ELP: 5.4 g/dL — AB (ref 6.0–8.5)

## 2017-07-25 LAB — BASIC METABOLIC PANEL
ANION GAP: 8 (ref 5–15)
BUN: 19 mg/dL (ref 6–20)
CALCIUM: 9.3 mg/dL (ref 8.9–10.3)
CO2: 28 mmol/L (ref 22–32)
Chloride: 106 mmol/L (ref 101–111)
Creatinine, Ser: 1.24 mg/dL — ABNORMAL HIGH (ref 0.44–1.00)
GFR, EST AFRICAN AMERICAN: 54 mL/min — AB (ref >60)
GFR, EST NON AFRICAN AMERICAN: 47 mL/min — AB (ref >60)
Glucose, Bld: 128 mg/dL — ABNORMAL HIGH (ref 65–99)
POTASSIUM: 3.9 mmol/L (ref 3.5–5.1)
SODIUM: 142 mmol/L (ref 135–145)

## 2017-07-25 LAB — GLUCOSE, CAPILLARY
GLUCOSE-CAPILLARY: 99 mg/dL (ref 65–99)
GLUCOSE-CAPILLARY: 141 mg/dL — AB (ref 65–99)
Glucose-Capillary: 100 mg/dL — ABNORMAL HIGH (ref 65–99)

## 2017-07-25 LAB — MAGNESIUM: MAGNESIUM: 1.7 mg/dL (ref 1.7–2.4)

## 2017-07-25 LAB — T3: T3 TOTAL: 85 ng/dL (ref 71–180)

## 2017-07-25 MED ORDER — POLYETHYLENE GLYCOL 3350 17 G PO PACK
17.0000 g | PACK | Freq: Every day | ORAL | Status: DC
  Administered 2017-07-25: 17 g via ORAL
  Filled 2017-07-25: qty 1

## 2017-07-25 MED ORDER — BISACODYL 5 MG PO TBEC: 5.0000 mg | DELAYED_RELEASE_TABLET | Freq: Every day | ORAL | 0 refills | Status: DC | PRN

## 2017-07-25 MED ORDER — POLYSACCHARIDE IRON COMPLEX 150 MG PO CAPS: 150.0000 mg | ORAL_CAPSULE | Freq: Every day | ORAL | 0 refills | Status: DC

## 2017-07-25 MED ORDER — CEFDINIR 300 MG PO CAPS: 300.0000 mg | ORAL_CAPSULE | Freq: Two times a day (BID) | ORAL | 0 refills | Status: AC

## 2017-07-25 MED ORDER — AMLODIPINE BESYLATE 10 MG PO TABS: 10.0000 mg | ORAL_TABLET | Freq: Every day | ORAL | 0 refills | Status: DC

## 2017-07-25 MED ORDER — ACETAMINOPHEN 500 MG PO TABS: 1000.0000 mg | ORAL_TABLET | Freq: Four times a day (QID) | ORAL | Status: DC | PRN

## 2017-07-25 NOTE — Procedures (Signed)
ELECTROENCEPHALOGRAM REPORT  Date of Study: 07/25/2017  Patient's Name: Andrea Glenn MRN: [id number] Date of Birth: 12-27-1960  Referring Provider: Dr. Doreatha Lew  Clinical History: This is a 57 year old woman with tremulous lower extremity, r/o seizure  Medications: Tylenol Norvasc Rocephin  Technical Summary: A multichannel digital EEG recording measured by the international 10-20 system with electrodes applied with paste and impedances below 5000 ohms performed in our laboratory with EKG monitoring in an awake and asleep patient.  Hyperventilation and photic stimulation were not performed.  The digital EEG was referentially recorded, reformatted, and digitally filtered in a variety of bipolar and referential montages for optimal display.    Description: The patient is awake and asleep during the recording.  During maximal wakefulness, there is a symmetric, medium voltage 8-8.5 Hz posterior dominant rhythm that attenuates with eye opening.  The record is symmetric.  During drowsiness and sleep, there is an increase in theta slowing of the background, with shifting asymmetry over the bilateral temporal regions.  Vertex waves and symmetric sleep spindles were seen.  Hyperventilation and photic stimulation did not elicit any abnormalities.  There were no epileptiform discharges or electrographic seizures seen.    EKG lead was unremarkable.  Impression: This awake and asleep EEG is normal.    Clinical Correlation: A normal EEG does not exclude a clinical diagnosis of epilepsy. Clinical correlation is advised.   Ellouise Newer, M.D.

## 2017-07-25 NOTE — Discharge Summary (Signed)
Physician Discharge Summary  BRITANIE HARSHMAN  FKC:[id number]  DOB: 24-Jul-1960  DOA: 07/22/2017 PCP: [place] Bing, DO  Admit date: 07/22/2017 Discharge date: 07/25/2017  Admitted From: Home Disposition: Home  Recommendations for Outpatient Follow-up:  1. Follow up with PCP in 1 week 2. Please obtain BMP/CBC in one week monitor hemoglobin and renal function  Home Health: PT/OT Equipment/Devices: Walker  Discharge Condition: Stable CODE STATUS: Full code Diet recommendation: Heart Healthy / Carb Modified   Brief/Interim Summary: For full details see H&P/Progress note, but in brief, LOUISIANA SEARLES is a 57 year old female with medical history significant for type 2 diabetes mellitus with peripheral neuropathy, hypertension and diastolic CHF presented to the emergency department complaining of unsteady gait and tremor.  Patient attributed her issues to discontinue Neurontin, however she reports that after starting Neurontin she began to feel weaker.  Patient was seen in the ER 3 days prior to admission and diagnosed with UTI urine cultures were positive for E. coli and Streptococcus gallolyticus.  She was prescribed Keflex which she has been taking only 3 doses.  Patient patient was found to have elevated creatinine and she was admitted for working diagnosis of acute renal failure and tremors.  Subjective: Patient seen and examined, report feeling slight better.  No acute events overnight.  Remains afebrile.  Discharge Diagnoses/Hospital Course:  Presyncope - Autonomic neuropathy  In setting of dehydration, and partially treated UTI.  Orthostatic hypotension probably from HCTZ, dehydration and autonomic neuropathy.  Patient never had LOC HCTZ has been d/ced  Carotid Dopplers negative, she had an echocardiogram done in 05/2017 which showed normal LVEF with grade 2 diastolic dysfunction. No acute findings on MRI head. EEG negative.  Follow up with PCP. Advised to slowly rise from bed.    Upper and lower extremity tremors - upper tremors resolved  Patient reproduce her tremors with intention, wondering if this is conversion disorder. Patient very tangential. When evaluated by physical therapist she began to be tremulous on her lower extremity while walking which resolved quickly after sitting down. MRI head and EEG negative. Follow up with PCP.   AKI on CKD stage II Dehydration, pre-renal azotemia  Cr at baseline  FeNa c/w prerenal azotemia  [place] c/w with mild chronic kidney diseases   UTI Cultures from 07/19/17 were positive for E. coli and Streptococcus gallolyticus. Patient was noncompliant with medication only took 3 doses of Keflex, partially treated Patient was treated with Rocephin while in hospital, will d/c on Omnicef to complete 7 days.   Diabetes mellitus type 2 CBG's stable during hospital stay  Continue home management with no changes   Hypertension HCTZ was d/ced due to orthostatics  Started on Amlodipine, may benefit from ACE to protect kidneys - defer to PCP if BP stable  Continue amlodipine 10 mg daily, follow up in 1 week with PCP   Anemia of chronic disease Iron supplement  L Ankle pain/swelling No fracture  Podiatry evaluation as OP   Constipation Tap water enema and MiraLax given  Discharge on bisacodyl, patient did not tolerated MiraLax - report nausea.   All other chronic medical condition were stable during the hospitalization.  Patient was seen by physical therapy, recommending HHPT, patient decline due to cost  On the day of the discharge the patient's vitals were stable, and no other acute medical condition were reported by patient. the patient was felt safe to be discharge to home   Discharge Instructions  You were cared for by a hospitalist during  your hospital stay. If you have any questions about your discharge medications or the care you received while you were in the hospital after you are discharged, you can call the unit  and asked to speak with the hospitalist on call if the hospitalist that took care of you is not available. Once you are discharged, your primary care physician will handle any further medical issues. Please note that NO REFILLS for any discharge medications will be authorized once you are discharged, as it is imperative that you return to your primary care physician (or establish a relationship with a primary care physician if you do not have one) for your aftercare needs so that they can reassess your need for medications and monitor your lab values.  Discharge Instructions    Call MD for:  difficulty breathing, headache or visual disturbances   Complete by:  As directed    Call MD for:  extreme fatigue   Complete by:  As directed    Call MD for:  hives   Complete by:  As directed    Call MD for:  persistant dizziness or light-headedness   Complete by:  As directed    Call MD for:  persistant nausea and vomiting   Complete by:  As directed    Call MD for:  redness, tenderness, or signs of infection (pain, swelling, redness, odor or green/yellow discharge around incision site)   Complete by:  As directed    Call MD for:  severe uncontrolled pain   Complete by:  As directed    Call MD for:  temperature >100.4   Complete by:  As directed    Diet - low sodium heart healthy   Complete by:  As directed    Increase activity slowly   Complete by:  As directed      Allergies as of 07/25/2017      Reactions   Atorvastatin Other (See Comments)   Suicidal thoughts   Amitriptyline Other (See Comments)   Depression   Metformin And Related Diarrhea      Medication List    STOP taking these medications   cephALEXin 500 MG capsule Commonly known as:  KEFLEX   gabapentin 300 MG capsule Commonly known as:  NEURONTIN   hydrochlorothiazide 12.5 MG tablet Commonly known as:  HYDRODIURIL     TAKE these medications   acetaminophen 500 MG tablet Commonly known as:  TYLENOL Take 1,000 mg by  mouth every 6 (six) hours as needed for headache (pain).   amLODipine 10 MG tablet Commonly known as:  NORVASC Take 1 tablet (10 mg total) by mouth daily. Start taking on:  07/26/2017   cefdinir 300 MG capsule Commonly known as:  OMNICEF Take 1 capsule (300 mg total) by mouth 2 (two) times daily for 3 days.   Dulaglutide 0.75 MG/0.5ML Sopn Commonly known as:  TRULICITY Inject 9.75 mg into the skin once a week. What changed:  when to take this   nicotine 7 mg/24hr patch Commonly known as:  NICODERM CQ - dosed in mg/24 hr Place 1 patch (7 mg total) onto the skin daily.   OVER THE COUNTER MEDICATION Place 1 drop into both eyes 4 (four) times daily as needed (dry eyes/irritation). Over the counter lubricating eye drop      Follow-up Information    [facility] [place] [facility], DO. Schedule an appointment as soon as possible for a visit in 1 week(s).   Specialty:  Family Medicine Why:  Hospital follow up  Contact information: [address] [place] [address] [phone]          Allergies  Allergen Reactions  . Atorvastatin Other (See Comments)    Suicidal thoughts  . Amitriptyline Other (See Comments)    Depression  . Metformin And Related Diarrhea    Consultations:  None   Procedures/Studies: Dg Chest 2 View  Result Date: 07/18/2017 CLINICAL DATA:  57 year old female with chest pain. EXAM: CHEST - 2 VIEW COMPARISON:  Chest radiograph dated 07/12/2017 FINDINGS: The lungs are clear. There is no pleural effusion or pneumothorax. The cardiac silhouette is within normal limits. Stable slight leftward deviation of the upper thoracic trachea may be related to a thyroid nodule or enlargement of the right thyroid gland. Degenerative changes of the shoulders. No acute osseous pathology. IMPRESSION: No active cardiopulmonary disease. Electronically Signed   By: Anner Crete M.D.   On: 07/18/2017 23:19   Dg Chest 2 View  Result Date: 07/12/2017 CLINICAL DATA:  Shortness of  breath with lower extremity edema EXAM: CHEST - 2 VIEW COMPARISON:  None. FINDINGS: There is no edema or consolidation. Heart size and pulmonary vascularity are normal. No adenopathy. No bone lesions. There is slight upper thoracic leftward deviation of the trachea. IMPRESSION: No edema or consolidation. Slight upper thoracic leftward deviation of the trachea. Question localized thyroid enlargement in this area. Electronically Signed   By: Lowella Grip III M.D.   On: 07/12/2017 16:27   Dg Ankle 2 Views Left  Result Date: 07/23/2017 CLINICAL DATA:  Left ankle and foot pain for 2 weeks.  No injury. EXAM: LEFT ANKLE - 2 VIEW COMPARISON:  None. FINDINGS: No fracture.  No bone lesion. The ankle joint is normally spaced and aligned. No arthropathic changes. Soft tissues are unremarkable. IMPRESSION: Negative. Electronically Signed   By: Lajean Manes M.D.   On: 07/23/2017 17:48   Dg Abd 1 View  Result Date: 07/22/2017 CLINICAL DATA:  Weakness and vomiting. EXAM: ABDOMEN - 1 VIEW COMPARISON:  CT abdomen pelvis dated October 07, 2016. FINDINGS: The bowel gas pattern is normal. No radio-opaque calculi or other significant radiographic abnormality are seen. IMPRESSION: Negative. Electronically Signed   By: Titus Dubin M.D.   On: 07/22/2017 23:11   Ct Head Wo Contrast  Result Date: 07/22/2017 CLINICAL DATA:  Generalized weakness with increasing tremors. Acute headache. EXAM: CT HEAD WITHOUT CONTRAST TECHNIQUE: Contiguous axial images were obtained from the base of the skull through the vertex without intravenous contrast. COMPARISON:  None. FINDINGS: BRAIN: The ventricles and sulci are normal. No intraparenchymal hemorrhage, mass effect nor midline shift. No acute large vascular territory infarcts. Grey-white matter distinction is maintained. The basal ganglia are unremarkable. No abnormal extra-axial fluid collections. Basal cisterns are not effaced and midline. The brainstem and cerebellar hemispheres are  without acute abnormalities. VASCULAR: Unremarkable. SKULL/SOFT TISSUES: No skull fracture. No significant soft tissue swelling. ORBITS/SINUSES: The included ocular globes and orbital contents are normal.The mastoid air cells are clear. The included paranasal sinuses are well-aerated. OTHER: None. IMPRESSION: Normal appearing noncontrast head CT. Electronically Signed   By: Ashley Royalty M.D.   On: 07/22/2017 19:50   Mr Brain Wo Contrast  Result Date: 07/25/2017 CLINICAL DATA:  Recurrent syncope. Evaluate for CVA. Generalized weakness EXAM: MRI HEAD WITHOUT CONTRAST TECHNIQUE: Multiplanar, multiecho pulse sequences of the brain and surrounding structures were obtained without intravenous contrast. COMPARISON:  Head CT three days ago FINDINGS: Brain: No acute infarction, hemorrhage, hydrocephalus, extra-axial collection or mass lesion. Periventricular  FLAIR hyperintensity that is nonspecific but likely chronic small vessel ischemia given patient's vascular risk factors. Vascular: Major flow voids are preserved, including vertebrobasilar. Skull and upper cervical spine: No evidence of marrow lesion. Hypoplastic appearance of the clivus tip with borderline basilar invagination. No cervicomedullary mass-effect or signal abnormality to explain symptoms. Sinuses/Orbits: Negative IMPRESSION: 1. No specific explanation for symptoms. Negative for posterior circulation infarct. 2. Mild chronic small vessel ischemia in the cerebral white matter. Electronically Signed   By: Monte Fantasia M.D.   On: 07/25/2017 15:06   US Renal  Result Date: 07/24/2017 CLINICAL DATA:  Acute renal failure. EXAM: RENAL / URINARY TRACT ULTRASOUND COMPLETE COMPARISON:  CT, 10/07/2016 FINDINGS: Right Kidney: Length: 12.1 cm. Mild increased parenchymal echogenicity. No renal masses, stones or hydronephrosis. Left Kidney: Length: 11.1 cm. Mild increased renal parenchymal echogenicity. No renal masses, stones or hydronephrosis. Bladder: Appears  normal for degree of bladder distention. IMPRESSION: 1. No acute findings.  No hydronephrosis. 2. Mild increased renal parenchymal echogenicity bilaterally consistent with medical renal disease. No masses or stones. Electronically Signed   By: Lajean Manes M.D.   On: 07/24/2017 16:33    Discharge Exam: Vitals:   07/25/17 0530 07/25/17 1442  BP: (!) 170/96   Pulse: 86 86  Resp: 12 20  Temp: 98.6 F (37 C)   SpO2: 100% 99%   Vitals:   07/24/17 1725 07/24/17 2149 07/25/17 0530 07/25/17 1442  BP: (!) 165/81 129/68 (!) 170/96   Pulse: 84 79 86 86  Resp:  14 12 20   Temp:  97.9 F (36.6 C) 98.6 F (37 C)   TempSrc:  Oral Oral   SpO2:  98% 100% 99%  Weight:      Height:        General: NAD  Cardiovascular: RRR, S1/S2 +, no rubs, no gallops Respiratory: CTA bilaterally, no wheezing, no rhonchi Abdominal: Soft, NT, ND, bowel sounds + Extremities: No tremors noted, discoloration on L ankle, mild swelling non tender  The results of significant diagnostics from this hospitalization (including imaging, microbiology, ancillary and laboratory) are listed below for reference.     Microbiology: Recent Results (from the past 240 hour(s))  Urine culture     Status: Abnormal   Collection Time: 07/19/17 12:09 AM  Result Value Ref Range Status   Specimen Description URINE, CLEAN CATCH  Final   Special Requests   Final    NONE Performed at [facility], [address], [place], [place] [address]    Culture (A)  Final    >=100,000 COLONIES/mL ESCHERICHIA COLI >=100,000 COLONIES/mL STREPTOCOCCUS GALLOLYTICUS    Report Status 07/22/2017 FINAL  Final   Organism ID, Bacteria ESCHERICHIA COLI (A)  Final   Organism ID, Bacteria STREPTOCOCCUS GALLOLYTICUS (A)  Final      Susceptibility   Escherichia coli - MIC*    AMPICILLIN <=2 SENSITIVE Sensitive     CEFAZOLIN <=4 SENSITIVE Sensitive     CEFTRIAXONE <=1 SENSITIVE Sensitive     CIPROFLOXACIN <=0.25 SENSITIVE Sensitive      GENTAMICIN <=1 SENSITIVE Sensitive     IMIPENEM <=0.25 SENSITIVE Sensitive     NITROFURANTOIN <=16 SENSITIVE Sensitive     TRIMETH/SULFA <=20 SENSITIVE Sensitive     AMPICILLIN/SULBACTAM <=2 SENSITIVE Sensitive     PIP/TAZO <=4 SENSITIVE Sensitive     Extended ESBL NEGATIVE Sensitive     * >=100,000 COLONIES/mL ESCHERICHIA COLI   Streptococcus gallolyticus - MIC*    PENICILLIN 0.12 SENSITIVE Sensitive  CEFTRIAXONE <=0.12 SENSITIVE Sensitive     ERYTHROMYCIN <=0.12 SENSITIVE Sensitive     LEVOFLOXACIN 4 INTERMEDIATE Intermediate     VANCOMYCIN 0.25 SENSITIVE Sensitive     * >=100,000 COLONIES/mL STREPTOCOCCUS GALLOLYTICUS  Blood culture (routine x 2)     Status: None (Preliminary result)   Collection Time: 07/22/17  5:45 PM  Result Value Ref Range Status   Specimen Description   Final    BLOOD LEFT ANTECUBITAL Performed at [facility] [address], [place], [place] [address]    Special Requests   Final    BOTTLES DRAWN AEROBIC AND ANAEROBIC Blood Culture results may not be optimal due to an excessive volume of blood received in culture bottles Performed at [facility] [address], [place], [place] [address]    Culture   Final    NO GROWTH 3 DAYS Performed at [facility], [place] [address] [place], [place] [address]    Report Status PENDING  Incomplete  Blood culture (routine x 2)     Status: None (Preliminary result)   Collection Time: 07/22/17  6:07 PM  Result Value Ref Range Status   Specimen Description   Final    BLOOD LEFT HAND Performed at [facility] [address], [place], [place] [address]    Special Requests   Final    BOTTLES DRAWN AEROBIC AND ANAEROBIC Blood Culture adequate volume Performed at [facility] [address], [place], [place] [address]    Culture   Final    NO GROWTH 3 DAYS Performed at [facility], [place] [address], [place], [place] [address]     Report Status PENDING  Incomplete     Labs: BNP (last 3 results) Recent Labs    07/12/17 1550  BNP 528.4*   Basic Metabolic Panel: Recent Labs  Lab 07/18/17 1734 07/22/17 1745 07/23/17 0523 07/24/17 0519 07/25/17 0904  NA 136 142 144 144 142  K 3.7 3.8 3.2* 4.1 3.9  CL 101 104 107 110 106  CO2 25 31 29 29 28   GLUCOSE 342* 174* 142* 110* 128*  BUN 28* 33* 35* 23* 19  CREATININE 1.60* 1.62* 1.72* 1.17* 1.24*  CALCIUM 9.3 9.5 8.9 8.9 9.3  MG  --  1.9 1.8 1.6* 1.7  PHOS  --  4.1 4.1  --   --    Liver Function Tests: Recent Labs  Lab 07/18/17 1734 07/22/17 1745 07/23/17 0523  AST 16 20 16   ALT 8* 8* 8*  ALKPHOS 121 105 88  BILITOT 0.6 0.7 0.6  PROT 6.9 7.1 6.0*  ALBUMIN 2.8* 2.9* 2.3*   Recent Labs  Lab 07/22/17 1745  LIPASE 30   No results for input(s): AMMONIA in the last 168 hours. CBC: Recent Labs  Lab 07/18/17 1734 07/22/17 1745 07/23/17 0523  WBC 10.2 10.0 9.6  NEUTROABS  --  5.8  --   HGB 10.4* 10.2* 9.4*  HCT 33.1* 32.6* 30.9*  MCV 86.6 88.1 88.8  PLT 253 253 227   Cardiac Enzymes: Recent Labs  Lab 07/22/17 1745 07/22/17 2338 07/23/17 0523 07/23/17 1225  CKTOTAL 92  --   --   --   TROPONINI  --  0.03* 0.03* 0.03*   BNP: Invalid input(s): POCBNP CBG: Recent Labs  Lab 07/24/17 1657 07/24/17 2158 07/25/17 0747 07/25/17 1306 07/25/17 1635  GLUCAP 136* 112* 99 100* 141*   D-Dimer No results for input(s): DDIMER in the last  72 hours. Hgb A1c No results for input(s): HGBA1C in the last 72 hours. Lipid Profile No results for input(s): CHOL, HDL, LDLCALC, TRIG, CHOLHDL, LDLDIRECT in the last 72 hours. Thyroid function studies Recent Labs    07/22/17 1745  TSH 0.391   Anemia work up Recent Labs    07/23/17 0523  VITAMINB12 564  FOLATE 7.7  FERRITIN 327*  TIBC 224*  IRON 37  RETICCTPCT 2.1   Urinalysis    Component Value Date/Time   COLORURINE YELLOW 07/22/2017 2048   APPEARANCEUR HAZY (A) 07/22/2017 2048   LABSPEC  1.022 07/22/2017 2048   PHURINE 7.0 07/22/2017 2048   GLUCOSEU 150 (A) 07/22/2017 2048   HGBUR NEGATIVE 07/22/2017 2048   BILIRUBINUR NEGATIVE 07/22/2017 2048   BILIRUBINUR negative 01/20/2017 1645   KETONESUR 5 (A) 07/22/2017 2048   PROTEINUR >=300 (A) 07/22/2017 2048   UROBILINOGEN 1.0 01/20/2017 1645   UROBILINOGEN 0.2 07/28/2013 0008   NITRITE NEGATIVE 07/22/2017 2048   LEUKOCYTESUR NEGATIVE 07/22/2017 2048   Sepsis Labs Invalid input(s): PROCALCITONIN,  WBC,  LACTICIDVEN Microbiology Recent Results (from the past 240 hour(s))  Urine culture     Status: Abnormal   Collection Time: 07/19/17 12:09 AM  Result Value Ref Range Status   Specimen Description URINE, CLEAN CATCH  Final   Special Requests   Final    NONE Performed at [facility], [place] [address] [place], [place] [address]    Culture (A)  Final    >=100,000 COLONIES/mL ESCHERICHIA COLI >=100,000 COLONIES/mL STREPTOCOCCUS GALLOLYTICUS    Report Status 07/22/2017 FINAL  Final   Organism ID, Bacteria ESCHERICHIA COLI (A)  Final   Organism ID, Bacteria STREPTOCOCCUS GALLOLYTICUS (A)  Final      Susceptibility   Escherichia coli - MIC*    AMPICILLIN <=2 SENSITIVE Sensitive     CEFAZOLIN <=4 SENSITIVE Sensitive     CEFTRIAXONE <=1 SENSITIVE Sensitive     CIPROFLOXACIN <=0.25 SENSITIVE Sensitive     GENTAMICIN <=1 SENSITIVE Sensitive     IMIPENEM <=0.25 SENSITIVE Sensitive     NITROFURANTOIN <=16 SENSITIVE Sensitive     TRIMETH/SULFA <=20 SENSITIVE Sensitive     AMPICILLIN/SULBACTAM <=2 SENSITIVE Sensitive     PIP/TAZO <=4 SENSITIVE Sensitive     Extended ESBL NEGATIVE Sensitive     * >=100,000 COLONIES/mL ESCHERICHIA COLI   Streptococcus gallolyticus - MIC*    PENICILLIN 0.12 SENSITIVE Sensitive     CEFTRIAXONE <=0.12 SENSITIVE Sensitive     ERYTHROMYCIN <=0.12 SENSITIVE Sensitive     LEVOFLOXACIN 4 INTERMEDIATE Intermediate     VANCOMYCIN 0.25 SENSITIVE Sensitive     * >=100,000 COLONIES/mL  STREPTOCOCCUS GALLOLYTICUS  Blood culture (routine x 2)     Status: None (Preliminary result)   Collection Time: 07/22/17  5:45 PM  Result Value Ref Range Status   Specimen Description   Final    BLOOD LEFT ANTECUBITAL Performed at [facility], [place] [address] [place], [place] [address]    Special Requests   Final    BOTTLES DRAWN AEROBIC AND ANAEROBIC Blood Culture results may not be optimal due to an excessive volume of blood received in culture bottles Performed at [facility] [address], [place], [place] [address]    Culture   Final    NO GROWTH 3 DAYS Performed at [facility], [place] [address] [place],  [address]    Report Status PENDING  Incomplete  Blood culture (routine x 2)  Status: None (Preliminary result)   Collection Time: 07/22/17  6:07 PM  Result Value Ref Range Status   Specimen Description   Final    BLOOD LEFT HAND Performed at [facility] [address], [place], [place] [address]    Special Requests   Final    BOTTLES DRAWN AEROBIC AND ANAEROBIC Blood Culture adequate volume Performed at [facility] [address] [place], [place] [address]    Culture   Final    NO GROWTH 3 DAYS Performed at [facility], [place] [address], [place], [place] [address]    Report Status PENDING  Incomplete     Time coordinating discharge: 40 minutes  SIGNED:  Chipper Oman, MD  [facility] 07/25/2017, 4:52 PM  Pager please text page via  [email or web address]  Note - This record has been created using [employer]. Chart creation errors have been sought, but may not always have been located. Such creation errors do not reflect on the standard of medical care.

## 2017-07-25 NOTE — Progress Notes (Signed)
OT Cancellation Note  Patient Details Name: Andrea Glenn MRN: [id number] DOB: 1961/04/09   Cancelled Treatment:    Reason Eval/Treat Not Completed: Patient at procedure or test/ unavailable  Kari Baars, OT [phone] Payton Mccallum D 07/25/2017, 2:19 PM

## 2017-07-25 NOTE — Care Management Note (Signed)
Case Management Note  Patient Details  Name: Andrea Glenn MRN: [id number] Date of Birth: [birth year]  Subjective/Objective:  PT/OT recc.HHC, also recc 3n1(patient will get on her own). Patient does not have insurance, & works-she states she can't afford HHC-she declines [place]. 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 Has a pcp. No further CM needs.                  Action/Plan:d/c home.   Expected Discharge Date:  (unknown)               Expected Discharge Plan:  Home/Self Care  In-House Referral:     Discharge planning Services  CM Consult  Post Acute Care Choice:    Choice offered to:     DME Arranged:    DME Agency:     HH Arranged:  Patient Refused [place] Agency:     Status of Service:  In process, will continue to follow  If discussed at Long Length of Stay Meetings, dates discussed:    Additional Comments:  Dessa Phi, RN 07/25/2017, 2:17 PM

## 2017-07-25 NOTE — Progress Notes (Signed)
EEG Completed; Results Pending  

## 2017-07-25 NOTE — Progress Notes (Signed)
Physical Therapy Treatment Patient Details Name: Andrea Glenn MRN: [id number] DOB: 1961-03-14 Today's Date: 07/25/2017    History of Present Illness pt admit after a fall at home. Pt has DMII and diabetic neuropathy, with admission 9 months ago with pancreatitis.     PT Comments    Patient not progressing this session due to orthostatic hypotension, nausea with mobility and c/o feeling weak, etc.  Hope she will be able to go home with [facility] services and will need family support initially for mobility.  PT to follow.   Follow Up Recommendations  Home health PT     Equipment Recommendations  None recommended by PT    Recommendations for Other Services       Precautions / Restrictions Precautions Precautions: Fall Precaution Comments: watch BP    Mobility  Bed Mobility Overal bed mobility: Modified Independent                Transfers Overall transfer level: Needs assistance Equipment used: Rolling walker (2 wheeled) Transfers: Sit to/from Stand Sit to Stand: Min assist         General transfer comment: for balance  Ambulation/Gait             General Gait Details: NT due to orthostatic   Stairs            Wheelchair Mobility    Modified Rankin (Stroke Patients Only)       Balance Overall balance assessment: Needs assistance Sitting-balance support: No upper extremity supported;Feet supported Sitting balance-Leahy Scale: Good     Standing balance support: Bilateral upper extremity supported;During functional activity Standing balance-Leahy Scale: Poor Standing balance comment: weak, nauseated, shaky UE support needed while BP measured, then sat due to symptomatic                            Cognition Arousal/Alertness: Awake/alert Behavior During Therapy: WFL for tasks assessed/performed Overall Cognitive Status: Within Functional Limits for tasks assessed                                         Exercises      General Comments General comments (skin integrity, edema, etc.): transport arrived to take pt to MRI after back in bed      Pertinent Vitals/Pain Pain Assessment: Faces Faces Pain Scale: Hurts a little bit Pain Location: feet (neuropathy) Pain Descriptors / Indicators: Burning Pain Intervention(s): Monitored during session    Home Living                      Prior Function            PT Goals (current goals can now be found in the care plan section) Progress towards PT goals: Not progressing toward goals - comment(due to orthostatic hypotension )    Frequency    Min 3X/week      PT Plan Current plan remains appropriate    Co-evaluation              AM-PAC PT "6 Clicks" Daily Activity  Outcome Measure  Difficulty turning over in bed (including adjusting bedclothes, sheets and blankets)?: None Difficulty moving from lying on back to sitting on the side of the bed? : A Little Difficulty sitting down on and standing up from a chair with arms (e.g., wheelchair, bedside commode, etc,.)?:  Unable Help needed moving to and from a bed to chair (including a wheelchair)?: A Lot Help needed walking in hospital room?: Total Help needed climbing 3-5 steps with a railing? : Total 6 Click Score: 12    End of Session Equipment Utilized During Treatment: Gait belt Activity Tolerance: Treatment limited secondary to medical complications (Comment)(orthostatic, diaphoretic, nauseated) Patient left: in bed;with call bell/phone within reach   PT Visit Diagnosis: Other abnormalities of gait and mobility (R26.89);Muscle weakness (generalized) (M62.81)     Time: 1350-1410 PT Time Calculation (min) (ACUTE ONLY): 20 min  Charges:  $Therapeutic Activity: 8-22 mins                    G CodesMagda Glenn, [place] [phone] 07/25/2017    Andrea Glenn 07/25/2017, 2:50 PM

## 2017-07-27 LAB — CULTURE, BLOOD (ROUTINE X 2)
CULTURE: NO GROWTH
Culture: NO GROWTH
Special Requests: ADEQUATE

## 2017-07-29 ENCOUNTER — Encounter: Payer: Self-pay | Admitting: Pharmacist

## 2017-07-29 ENCOUNTER — Ambulatory Visit (INDEPENDENT_AMBULATORY_CARE_PROVIDER_SITE_OTHER): Payer: Self-pay | Admitting: Pharmacist

## 2017-07-29 VITALS — BP 125/70 | HR 88 | Ht 64.0 in | Wt 118.2 lb

## 2017-07-29 DIAGNOSIS — R296 Repeated falls: Secondary | ICD-10-CM

## 2017-07-29 DIAGNOSIS — IMO0002 Reserved for concepts with insufficient information to code with codable children: Secondary | ICD-10-CM

## 2017-07-29 DIAGNOSIS — E1142 Type 2 diabetes mellitus with diabetic polyneuropathy: Secondary | ICD-10-CM

## 2017-07-29 DIAGNOSIS — R29898 Other symptoms and signs involving the musculoskeletal system: Secondary | ICD-10-CM

## 2017-07-29 DIAGNOSIS — E1165 Type 2 diabetes mellitus with hyperglycemia: Secondary | ICD-10-CM

## 2017-07-29 NOTE — Patient Instructions (Addendum)
Thank you for coming into the clinic today!  The Neurology clinic will contact / call you when they are able to see you. Could be today (Friday), Monday, or Tuesday.   No changes in your medications today.   Follow up with Dr. Yisroel Ramming.

## 2017-07-29 NOTE — Progress Notes (Signed)
    S:     Chief Complaint  Patient presents with  . Medication Management    Diabetes     Patient arrives in a wheelchair and in good spirits.  Presents for diabetes evaluation, education, and management follow-up.  Last seen by PCP, Dr. Yisroel Ramming 07/08/2017.  Presents with a CC of generalized fatigue and extreme weakness that have resulted in multiple falls.   Swelling is reported as significantly decreased.  Patient still has complaint of foot and lower leg swelling making it difficult to where shoes. Reports occasional headaches with stressful situations.   Patient reports adherence with medications.  Current diabetes medications include:  Trulicity (dulaglutide) Current hypertension medications include: amlodipine 10mg  daily  Patient denies hypoglycemic events.  Patient reported dietary habits: Eats 2 small meals/day  Patient reported exercise habits:   O:  Physical Exam  Neurological:  Bilateral upper extremity tremor with standing Bilateral leg weakness   Review of Systems  Neurological: Positive for tingling, tremors, loss of consciousness and weakness.   Reports feeling a "shaky feeling in my arms"" prior to losing muscle tone and falling. Only able to stand for a max of 3 minutes.   Lab Results  Component Value Date   HGBA1C 6.6 06/21/2017   Vitals:   07/29/17 0845  BP: 127/70  Pulse: 88  SpO2: 95%    Home fasting CBG: 100-200 Denies low (< 100) of elevated (> 200) readings.    A/P: Diabetes improved control with use of GLP - dulaglutide.  No change today.   Neurologic complaint of weakness, new and worsening. Rapid improvement of blood sugar several months ago may be associated with treatment induced neuropathy.   Worsening symptoms discussed with Dr. Andria Frames and referral for neurologic evaluation planned.  (recently discontinued gabapentin).    Inquired about disability - deferred until after neurology evaluation and with PCP - Dr. Yisroel Ramming.     Written patient instructions provided.  Total time in face to face counseling 25 minutes.   Follow up in Pharmacist Clinic Visit PRN.  Next [facility] visit with PCP - [place].   Patient seen with Hildred Alamin, PharmD Candidate and Bertis Ruddy, PharmD, PGY1 Resident.

## 2017-07-29 NOTE — Progress Notes (Signed)
Patient ID: Andrea Glenn, female   DOB: November 18, 1960, 57 y.o.   MRN: [id number] Reviewed note.  Also saw patient.  I am concerned by her worsening neurologic status.  Our working dx is treatment induced myopathy.  I think that with her worsening, we need to keep our differential open to other possibilities.  We will get neurology to see for evaluation (and further testing at their discretion) to help [place] arrive at a firm diagnosis and plan.  Functionally, PT needs to help.

## 2017-07-29 NOTE — Assessment & Plan Note (Addendum)
Diabetes improved control with use of GLP - dulaglutide.  No change today.   Obtained follow up labs requested following recent ER visit for UTI  CBC and BMET obtained.

## 2017-07-29 NOTE — Assessment & Plan Note (Signed)
Neurologic complaint of weakness, new and worsening. Rapid improvement of blood sugar several months ago may be associated with treatment induced neuropathy.   Worsening symptoms discussed with Dr. Andria Frames and referral for neurologic evaluation planned.  (recently discontinued gabapentin).

## 2017-07-30 LAB — CBC
HEMATOCRIT: 32.3 % — AB (ref 34.0–46.6)
Hemoglobin: 10.1 g/dL — ABNORMAL LOW (ref 11.1–15.9)
MCH: 26.9 pg (ref 26.6–33.0)
MCHC: 31.3 g/dL — ABNORMAL LOW (ref 31.5–35.7)
MCV: 86 fL (ref 79–97)
PLATELETS: 254 10*3/uL (ref 150–379)
RBC: 3.75 x10E6/uL — AB (ref 3.77–5.28)
RDW: 15.2 % (ref 12.3–15.4)
WBC: 8.2 10*3/uL (ref 3.4–10.8)

## 2017-07-30 LAB — BASIC METABOLIC PANEL
BUN / CREAT RATIO: 17 (ref 9–23)
BUN: 26 mg/dL — AB (ref 6–24)
CALCIUM: 9.4 mg/dL (ref 8.7–10.2)
CHLORIDE: 101 mmol/L (ref 96–106)
CO2: 27 mmol/L (ref 20–29)
Creatinine, Ser: 1.56 mg/dL — ABNORMAL HIGH (ref 0.57–1.00)
GFR, EST AFRICAN AMERICAN: 42 mL/min/{1.73_m2} — AB (ref >59)
GFR, EST NON AFRICAN AMERICAN: 36 mL/min/{1.73_m2} — AB (ref >59)
Glucose: 212 mg/dL — ABNORMAL HIGH (ref 65–99)
Potassium: 4.2 mmol/L (ref 3.5–5.2)
Sodium: 140 mmol/L (ref 134–144)

## 2017-08-05 ENCOUNTER — Telehealth: Payer: Self-pay | Admitting: Family Medicine

## 2017-08-05 NOTE — Telephone Encounter (Signed)
Contacted patient regarding recent worsening kidney function.  No answer, left voicemail.  Patient has no scheduled follow-up with me.  Requested patient contact our clinic on Monday to schedule appointment ideally within the next week or 2.  Harriet Butte, [place], PGY-2

## 2017-08-09 ENCOUNTER — Telehealth: Payer: Self-pay

## 2017-08-09 NOTE — Telephone Encounter (Signed)
Patient left message she is returning a call to PCP to update him on status after hospitalization. Call back is [phone].  Danley Danker, RN [facility] RN)

## 2017-08-10 ENCOUNTER — Telehealth: Payer: Self-pay | Admitting: Family Medicine

## 2017-08-10 NOTE — Telephone Encounter (Signed)
Contacted patient regarding follow-up.  Last seen by Dr. Valentina Lucks on 07/29/2017 and found to have worsening kidney function.  Awaiting neurology appointment in June.  Suspect underlying neuropathy is secondary to treatment-induced neuropathy with correction of chronic hyperglycemia.  Patient has been taking Trulicity as instructed along with amlodipine for blood pressure control.  She is using her walker to ambulate and continues to be out of work.  Recommended patient follow-up within the next week or 2.  No appointments available with me in the near future.  Scheduled appointment with Dr. Dallas Schimke on 08/17/2017 at 10:10 AM.  Advised patient to continue medications as instructed.   Harriet Butte, [place], PGY-2

## 2017-08-16 NOTE — Progress Notes (Signed)
[facility] Phone: [phone]   Date of Visit: 08/17/2017   HPI:  Hospital follow-up: Patient was hospitalized from April 5 to April 8 for unsteady gait and tremor   And was found to have AKI. -Per discharge summary her symptoms of presyncope was thought to be due to dehydration in the setting of a partially treated UTI as well as autonomic neuropathy.  MRI head was unremarkable.  EEG was negative.  Carotid Dopplers were negative.  Her upper and lower extremity tremors were thought to be possible conversion disorder.  Her UTI was treated with Rocephin and Omnicef for total of 7 days.  HCTZ was discontinued due to orthostatic blood pressures.   - She was seen in our clinic disease at pharmacy clinic) for the first time after her discharge on 07-29-2017.  At that time it was reported that she still had neurologic complaints and due to this concern, she was referred to neurology.  Per chart review it looks like patient was referred to both neurology offices, was not able to get a new appointment sooner than the end of June. At that time she was also noted to have significant weight loss.  -She continues to have these neurologic symptoms.  She says that they may have improved a little bit.  She reports that when the fingers of her left hand tingle, she knows that she is going to fall.  Therefore she braces herself. she initially has "shakes" reports that one day shortly after the discharge she had 5-6 falls.  Since her symptoms started about a month ago, she has needed a cane or a walker to help her ambulate. -She reports a one-week history of dysuria, urinary frequency.  No fevers or flank pain or abdominal pain.  No hematuria -She was unable to go back to work due to her symptoms -Reports she feels very cold in the clinic room  ROS: See HPI.  Welcome:  PMH: HTN Pancreatitis DM2 HLD CKD Tobacco Use Restless Leg Syndrome   PHYSICAL EXAM: BP 126/64   Pulse 83    Temp 98.1 F (36.7 C) (Oral)   Ht _0  (1.626 m)   Wt 116 lb (52.6 kg)   SpO2 99%   BMI 19.91 kg/m  GEN: NAD, nontoxic in appearance, she intermittently has tremors, reports she feels very cold and HEENT: Atraumatic, normocephalic, neck supple, EOMI, sclera clear  CV: RRR, no murmurs, rubs, or gallops PULM: CTAB, normal effort ABD: Soft, nontender, nondistended, NABS, no organomegaly SKIN: No rash or cyanosis; warm and well-perfused EXTR: No lower extremity edema or calf tenderness PSYCH: Mood and affect euthymic, normal rate and volume of speech NEURO: Awake, alert, cranial nerves II through XII intact, normal speech,  her gait is unsteady.  When she ambulates from her chair to the exam table she has to hold onto the exam table for balance.  She is able to walk without holding onto any furniture or other equipment but her gait is very guarded.  Normal strength in the upper and lower extremities bilaterally and normal sensation to light touch.  She has difficulty with finger-to-nose testing bilaterally due to her tremor which she also attributes to her being cold in the room.  ASSESSMENT/PLAN:  Unsteady gait and tremor:  Unclear etiology however she does have concerning symptoms especially with unintentional weight loss. Differentials include myopathy (normal CK recently), cerebellar ataxia (recent MRI unremarkable), neurosyphilis, vitamin deficiency (normal folate and vitamin B12 recently), orthostatic (less likely with her  presentation), electrolyte disturbance (normal electrolytes on recent lab). Not consistent with vertigo.  It seems that she is unable to see neurology until late June.  Therefore will make a referral to  PM&R for evaluation.  Will obtain the following blood work: TSH, RPR, free T4, ESR, CRP, and BMP.  Discussed with Dr. Tonye Pearson, MD PGY [place]

## 2017-08-17 ENCOUNTER — Other Ambulatory Visit: Payer: Self-pay

## 2017-08-17 ENCOUNTER — Ambulatory Visit (INDEPENDENT_AMBULATORY_CARE_PROVIDER_SITE_OTHER): Payer: Medicaid Other | Admitting: Internal Medicine

## 2017-08-17 ENCOUNTER — Encounter: Payer: Self-pay | Admitting: Internal Medicine

## 2017-08-17 VITALS — BP 126/64 | HR 83 | Temp 98.1°F | Ht 64.0 in | Wt 116.0 lb

## 2017-08-17 DIAGNOSIS — Z87448 Personal history of other diseases of urinary system: Secondary | ICD-10-CM | POA: Diagnosis not present

## 2017-08-17 DIAGNOSIS — R2681 Unsteadiness on feet: Secondary | ICD-10-CM | POA: Diagnosis not present

## 2017-08-17 DIAGNOSIS — R251 Tremor, unspecified: Secondary | ICD-10-CM

## 2017-08-17 LAB — POCT URINALYSIS DIP (MANUAL ENTRY)
Bilirubin, UA: NEGATIVE
Glucose, UA: 100 mg/dL — AB
Leukocytes, UA: NEGATIVE
Nitrite, UA: NEGATIVE
SPEC GRAV UA: 1.02 (ref 1.010–1.025)
Urobilinogen, UA: 0.2 E.U./dL
pH, UA: 6.5 (ref 5.0–8.0)

## 2017-08-17 NOTE — Patient Instructions (Signed)
We will get some blood work today   We made a referral to physical medicine and rehab.

## 2017-08-18 LAB — T4, FREE: FREE T4: 1.11 ng/dL (ref 0.82–1.77)

## 2017-08-18 LAB — BASIC METABOLIC PANEL
BUN / CREAT RATIO: 23 (ref 9–23)
BUN: 29 mg/dL — ABNORMAL HIGH (ref 6–24)
CHLORIDE: 103 mmol/L (ref 96–106)
CO2: 24 mmol/L (ref 20–29)
CREATININE: 1.28 mg/dL — AB (ref 0.57–1.00)
Calcium: 9.9 mg/dL (ref 8.7–10.2)
GFR calc Af Amer: 53 mL/min/{1.73_m2} — ABNORMAL LOW (ref >59)
GFR calc non Af Amer: 46 mL/min/{1.73_m2} — ABNORMAL LOW (ref >59)
GLUCOSE: 215 mg/dL — AB (ref 65–99)
Potassium: 5.4 mmol/L — ABNORMAL HIGH (ref 3.5–5.2)
SODIUM: 138 mmol/L (ref 134–144)

## 2017-08-18 LAB — RPR: RPR Ser Ql: NONREACTIVE

## 2017-08-18 LAB — SEDIMENTATION RATE: SED RATE: 54 mm/h — AB (ref 0–40)

## 2017-08-18 LAB — TSH: TSH: 0.339 u[IU]/mL — AB (ref 0.450–4.500)

## 2017-08-18 LAB — C-REACTIVE PROTEIN: CRP: 0.9 mg/L (ref 0.0–4.9)

## 2017-08-22 ENCOUNTER — Other Ambulatory Visit: Payer: Self-pay

## 2017-08-22 MED ORDER — AMLODIPINE BESYLATE 10 MG PO TABS: 10.0000 mg | ORAL_TABLET | Freq: Every day | ORAL | 3 refills | Status: DC

## 2017-08-23 ENCOUNTER — Telehealth: Payer: Self-pay | Admitting: Internal Medicine

## 2017-08-23 ENCOUNTER — Other Ambulatory Visit: Payer: Self-pay | Admitting: Internal Medicine

## 2017-08-23 DIAGNOSIS — R946 Abnormal results of thyroid function studies: Secondary | ICD-10-CM

## 2017-08-23 NOTE — Telephone Encounter (Signed)
Please inform patient that her blood work is overall stable except her thyroid function study is slightly low. I would like to repeat this. Please ask her to make a lab visit for this. Will place in an orders only encounter.

## 2017-08-23 NOTE — Telephone Encounter (Signed)
Patient informed and appt made for 08-25-17. Donika Butner,CMA

## 2017-08-23 NOTE — Progress Notes (Signed)
Ordered TSH, Free T4 and T3 as patient had slightly low TSH

## 2017-08-25 ENCOUNTER — Other Ambulatory Visit: Payer: Medicaid Other

## 2017-08-25 DIAGNOSIS — R946 Abnormal results of thyroid function studies: Secondary | ICD-10-CM

## 2017-08-26 LAB — T3: T3, Total: 73 ng/dL (ref 71–180)

## 2017-08-26 LAB — T4, FREE: FREE T4: 1.06 ng/dL (ref 0.82–1.77)

## 2017-08-26 LAB — TSH: TSH: 0.166 u[IU]/mL — AB (ref 0.450–4.500)

## 2017-08-29 ENCOUNTER — Telehealth: Payer: Self-pay | Admitting: Internal Medicine

## 2017-08-29 NOTE — Telephone Encounter (Signed)
Attempted to call patient about thyroid function results. Went to [employer]. Left message to call back.

## 2017-08-30 ENCOUNTER — Telehealth: Payer: Self-pay | Admitting: Internal Medicine

## 2017-08-30 DIAGNOSIS — E0590 Thyrotoxicosis, unspecified without thyrotoxic crisis or storm: Secondary | ICD-10-CM

## 2017-08-30 NOTE — Telephone Encounter (Signed)
Called patient regarding her Thyroid function tests. I would like her to be seen by endocrinology. She is okay with this.   She has not heard from PM&R yet. Will send message to [facility]

## 2017-09-07 ENCOUNTER — Ambulatory Visit (INDEPENDENT_AMBULATORY_CARE_PROVIDER_SITE_OTHER): Payer: Medicaid Other | Admitting: Internal Medicine

## 2017-09-07 ENCOUNTER — Encounter: Payer: Self-pay | Admitting: Internal Medicine

## 2017-09-07 ENCOUNTER — Other Ambulatory Visit: Payer: Self-pay

## 2017-09-07 VITALS — BP 100/50 | Temp 98.0°F | Ht 64.0 in | Wt 110.4 lb

## 2017-09-07 DIAGNOSIS — B0229 Other postherpetic nervous system involvement: Secondary | ICD-10-CM | POA: Diagnosis not present

## 2017-09-07 DIAGNOSIS — F4321 Adjustment disorder with depressed mood: Secondary | ICD-10-CM | POA: Diagnosis not present

## 2017-09-07 DIAGNOSIS — E1165 Type 2 diabetes mellitus with hyperglycemia: Secondary | ICD-10-CM | POA: Diagnosis not present

## 2017-09-07 DIAGNOSIS — R208 Other disturbances of skin sensation: Secondary | ICD-10-CM | POA: Insufficient documentation

## 2017-09-07 DIAGNOSIS — R634 Abnormal weight loss: Secondary | ICD-10-CM

## 2017-09-07 DIAGNOSIS — E1142 Type 2 diabetes mellitus with diabetic polyneuropathy: Secondary | ICD-10-CM | POA: Diagnosis not present

## 2017-09-07 DIAGNOSIS — IMO0002 Reserved for concepts with insufficient information to code with codable children: Secondary | ICD-10-CM

## 2017-09-07 DIAGNOSIS — M792 Neuralgia and neuritis, unspecified: Secondary | ICD-10-CM | POA: Insufficient documentation

## 2017-09-07 LAB — POCT GLYCOSYLATED HEMOGLOBIN (HGB A1C): HBA1C, POC (CONTROLLED DIABETIC RANGE): 9.5 % — AB (ref 0.0–7.0)

## 2017-09-07 LAB — GLUCOSE, POCT (MANUAL RESULT ENTRY): POC GLUCOSE: 511 mg/dL — AB (ref 70–99)

## 2017-09-07 MED ORDER — CAPSAICIN 0.025 % EX CREA: TOPICAL_CREAM | Freq: Two times a day (BID) | CUTANEOUS | 0 refills | Status: DC

## 2017-09-07 MED ORDER — GABAPENTIN 100 MG PO CAPS: 100.0000 mg | ORAL_CAPSULE | Freq: Two times a day (BID) | ORAL | 3 refills | Status: DC

## 2017-09-07 MED ORDER — LIDOCAINE 5 % EX OINT: 1.0000 "application " | TOPICAL_OINTMENT | CUTANEOUS | 0 refills | Status: DC | PRN

## 2017-09-07 MED ORDER — KETOROLAC TROMETHAMINE 30 MG/ML IJ SOLN
30.0000 mg | Freq: Once | INTRAMUSCULAR | Status: AC
  Administered 2017-09-07: 30 mg via INTRAMUSCULAR

## 2017-09-07 MED ORDER — KETOROLAC TROMETHAMINE 30 MG/ML IJ SOLN: 30.0000 mg | Freq: Once | INTRAMUSCULAR | Status: DC

## 2017-09-07 NOTE — Patient Instructions (Addendum)
I believe this is post-herpatic neuralgia. I am going to give you medication to see if that helps with pain called gabapentin. Please take this up to twice daily. lease follow up concerning your weight loss next week. I am concerned about it. I am going to get some lab work today. Postherpetic Neuralgia Postherpetic neuralgia (PHN) is nerve pain that occurs after a shingles infection. Shingles is a painful rash that appears on one side of the body, usually on your trunk or face. Shingles is caused by the varicella-zoster virus. This is the same virus that causes chickenpox. In people who have had chickenpox, the virus can resurface years later and cause shingles. You may have PHN if you continue to have pain for 3 months after your shingles rash has gone away. PHN appears in the same area where you had the shingles rash. For most people, PHN goes away within 1 year. Getting a vaccination for shingles can prevent PHN. This vaccine is recommended for people older than 50. It may prevent shingles and may also lower your risk of PHN if you do get shingles. What are the causes? PHN is caused by damage to your nerves from the varicella-zoster virus. This damage makes your nerves overly sensitive. What increases the risk? Aging is the biggest risk factor for developing PHN. Most people who get PHN are older than 40. Other risk factors include:  Having very bad pain before your shingles rash starts.  Having a very bad rash.  Having shingles in the nerve that supplies your face and eye (trigeminal nerve).  What are the signs or symptoms? Pain is the main symptom of PHN. The pain is often very bad and may be described as stabbing, burning, or feeling like an electric shock. The pain may come and go or may be there all the time. Pain may be triggered by light touches on the skin or changes in temperature. You may have itching along with the pain. How is this diagnosed? Your health care provider may diagnose  PHN based on your symptoms and your history of shingles. Lab studies and other diagnostic tests are usually not needed. How is this treated? There is no cure for PHN. Treatment for PHN will focus on pain relief. Over-the-counter pain relievers do not usually relieve PHN pain. You may need to work with a pain specialist. Treatment may include:  Antidepressant medicines to help with pain and improve sleep.  Antiseizure medicines to relieve nerve pain.  Strong pain relievers (opioids).  A numbing patch worn on the skin (lidocaine patch).  Follow these instructions at home: It may take a long time to recover from PHN. Work closely with your health care provider, and have a good support system at home.  Take all medicines as directed by your health care provider.  Wear loose, comfortable clothing.  Cover sensitive areas with a dressing to reduce friction from clothing rubbing on the area.  If cold does not make your pain worse, try applying a cool compress or cooling gel pack to the area.  Talk to your health care provider if you feel depressed or desperate. Living with long-term pain can be depressing.  Contact a health care provider if:  Your medicine is not helping.  You are struggling to manage your pain at home. This information is not intended to replace advice given to you by your health care provider. Make sure you discuss any questions you have with your health care provider. Document Released: 06/26/2002 Document Revised:  09/11/2015 Document Reviewed: 03/27/2013 Elsevier Interactive Patient Education  [employer].

## 2017-09-07 NOTE — Progress Notes (Signed)
[name] Kerrin Mo, MD Phone: [phone]  Reason For Visit: SDA for Rash   Patient was very tearful during her visit because she states that her mother passed away a week and a half ago.  #Rash Patient states she developed a rash on her left side last week.  She states it was irritating and she scratched at it at times.  Since then she is now developed significant pain and burning sensation over that area -from her left abdomen to her back in a dermatomal pattern.  The rash has crusted over patient states that she had irritation gone away.  She denies any history of shingles.  She has  never had the vaccine.   #Weight loss  - Patient has had weight loss over the past 2-3 months which was noted today. She has also been having shaking began in February of this year.  She was seen in the hospital and had a MRI brain done which was negative. Patient is suppose to follow up with neurology. She also has thyriod labs done which were positive for possibly subacute hypothyroidisms. Patient is a current everyday smoke.    Past Medical History Reviewed problem list.  Medications- reviewed and updated No additions to family history Social history- patient is a  smoker  Objective: BP (!) 100/50   Temp 98 F (36.7 C) (Oral)   Ht 5\' 4"  (1.626 m)   Wt 110 lb 6.4 oz (50.1 kg)   SpO2 97%   BMI 18.95 kg/m  Gen: NAD, alert, cooperative with exam, patient with significant shaking during exam  Cardio: regular rate and rhythm, S1S2 heard, no murmurs appreciated Pulm: clear to auscultation bilaterally, no wheezes, rhonchi or rales Skin: Crusted over healing circular lesions consistent with zoster, hyperalgesia noted along the dermatome.   Assessment/Plan: See problem based a/p  Uncontrolled type 2 diabetes mellitus with peripheral neuropathy (HCC) A1C 9.3, CBG of 511 in the clinic  Patient states she has not taken her Trulicity today.  She states she was too shaky  to take it.  She states that her blood sugars are normally well controlled when she takes his medication.  Previous A1c in March was 6.6 Discussed either  getting somebody at home to help her take this injection or coming to the clinic-patient states she would deal with that herself Explained to her that given her A1c is significantly elevated from previous I want her to come back in 1 week to follow-up with me for further medication management  Weight loss 16 pound weight loss over the past  3 months.  Significantly concerning for some serious underlying illness. Notable for possible subacute hyperthyroidism on labs obtained 2 weeks ago, with a referral to endocrinology.  however discussed with endocrinology about the weight loss and they believe that this is unlikely to cause such significant weight loss.   -Endocrinology has recommended that free T3 is obtained rather than the total T3 as well as thyroid-stimulating hormone (TSH, thyrotropin)-receptor antibodies - Consider CT Chest/Abdomen/Pelvis given patient's smoking hx  - Lack of control of diabetes may also be contributing to patient's current weight loss - Referral's including PMR, Endocrinology and neurology are still pending.  - CBC and CMET, A1C, CBG - Never had colonoscopy due to lack of insurance - Asked patient to follow up in 1 week     Postherpetic neuralgia Patient refused gabapentin and Lyrica - insurance will not cover lidocaine cream  Provide with capsicin cream. Discussed lesion should  be completely healed over before use  Provided toradol shot per patient request  Grief Recent death of her mother 1 week ago  Does not want to see counseling today

## 2017-09-08 ENCOUNTER — Telehealth: Payer: Self-pay | Admitting: Internal Medicine

## 2017-09-08 DIAGNOSIS — F4321 Adjustment disorder with depressed mood: Secondary | ICD-10-CM | POA: Insufficient documentation

## 2017-09-08 DIAGNOSIS — B0229 Other postherpetic nervous system involvement: Secondary | ICD-10-CM

## 2017-09-08 HISTORY — DX: Adjustment disorder with depressed mood: F43.21

## 2017-09-08 HISTORY — DX: Other postherpetic nervous system involvement: B02.29

## 2017-09-08 LAB — COMPREHENSIVE METABOLIC PANEL
A/G RATIO: 0.7 — AB (ref 1.2–2.2)
ALT: 6 IU/L (ref 0–32)
AST: 9 IU/L (ref 0–40)
Albumin: 2.7 g/dL — ABNORMAL LOW (ref 3.5–5.5)
Alkaline Phosphatase: 135 IU/L — ABNORMAL HIGH (ref 39–117)
BUN/Creatinine Ratio: 12 (ref 9–23)
BUN: 21 mg/dL (ref 6–24)
Bilirubin Total: <0.2 mg/dL (ref 0.0–1.2)
CALCIUM: 9.3 mg/dL (ref 8.7–10.2)
CO2: 23 mmol/L (ref 20–29)
Chloride: 97 mmol/L (ref 96–106)
Creatinine, Ser: 1.77 mg/dL — ABNORMAL HIGH (ref 0.57–1.00)
GFR calc Af Amer: 36 mL/min/{1.73_m2} — ABNORMAL LOW (ref >59)
GFR, EST NON AFRICAN AMERICAN: 31 mL/min/{1.73_m2} — AB (ref >59)
Globulin, Total: 4.1 g/dL (ref 1.5–4.5)
Glucose: 585 mg/dL (ref 65–99)
POTASSIUM: 5.3 mmol/L — AB (ref 3.5–5.2)
Sodium: 134 mmol/L (ref 134–144)
Total Protein: 6.8 g/dL (ref 6.0–8.5)

## 2017-09-08 LAB — CBC
Hematocrit: 33.5 % — ABNORMAL LOW (ref 34.0–46.6)
Hemoglobin: 10.5 g/dL — ABNORMAL LOW (ref 11.1–15.9)
MCH: 27.5 pg (ref 26.6–33.0)
MCHC: 31.3 g/dL — ABNORMAL LOW (ref 31.5–35.7)
MCV: 88 fL (ref 79–97)
PLATELETS: 297 10*3/uL (ref 150–450)
RBC: 3.82 x10E6/uL (ref 3.77–5.28)
RDW: 13.7 % (ref 12.3–15.4)
WBC: 14.5 10*3/uL — AB (ref 3.4–10.8)

## 2017-09-08 NOTE — Telephone Encounter (Addendum)
Called patient to discuss her results.  Her blood sugars were notably high yesterday during clinic.  She has not yet taken her Trulicity for the week, however if she states she took it when she got home.  She did not check her blood sugars recently but she states that she will and if they were elevated she will follow-up.  Explained to patient that her kidney function showed that she likely has some dehydration.  Told her to drink plenty of water, warned against drinking drinks with sugar in them such as ginger ale.  Denied any significant symptoms since yesterday and stated she still has some of the burning sensation on her left side from the herpes zoster.  She does have an elevated white blood cell count  explained to her if she started having any other symptoms to go to theurgent care for evaluation.  Discussed with patient that I believe she needs further work-up for her weight loss after discussing her results with endocrinology.  Encouraged her to follow-up with me in the clinic next week. She stated that she would make an appointment

## 2017-09-08 NOTE — Assessment & Plan Note (Addendum)
16 pound weight loss over the past  3 months.  Significantly concerning for some serious underlying illness. Notable for possible subacute hyperthyroidism on labs obtained 2 weeks ago, with a referral to endocrinology.  however discussed with endocrinology about the weight loss and they believe that this is unlikely to cause such significant weight loss.   -Endocrinology has recommended that free T3 is obtained rather than the total T3 as well as thyroid-stimulating hormone (TSH, thyrotropin)-receptor antibodies - Consider CT Chest/Abdomen/Pelvis given patient's smoking hx  - Lack of control of diabetes may also be contributing to patient's current weight loss - Referral's including PMR, Endocrinology and neurology are still pending.  - CBC and CMET, A1C, CBG - Never had colonoscopy due to lack of insurance - Asked patient to follow up in 1 week

## 2017-09-08 NOTE — Assessment & Plan Note (Signed)
Recent death of her mother 1 week ago  Does not want to see counseling today

## 2017-09-08 NOTE — Assessment & Plan Note (Signed)
Patient refused gabapentin and Lyrica - insurance will not cover lidocaine cream  Provide with capsicin cream. Discussed lesion should be completely healed over before use  Provided toradol shot per patient request

## 2017-09-08 NOTE — Assessment & Plan Note (Signed)
A1C 9.3, CBG of 511 in the clinic  Patient states she has not taken her Trulicity today.  She states she was too shaky to take it.  She states that her blood sugars are normally well controlled when she takes his medication.  Previous A1c in March was 6.6 Discussed either  getting somebody at home to help her take this injection or coming to the clinic-patient states she would deal with that herself Explained to her that given her A1c is significantly elevated from previous I want her to come back in 1 week to follow-up with me for further medication management

## 2017-09-09 ENCOUNTER — Observation Stay (HOSPITAL_COMMUNITY)
Admission: EM | Admit: 2017-09-09 | Discharge: 2017-09-12 | Disposition: A | Discharge status: Home / Self Care | Payer: Medicaid Other | Attending: Family Medicine | Admitting: Family Medicine

## 2017-09-09 ENCOUNTER — Encounter (HOSPITAL_COMMUNITY): Payer: Self-pay

## 2017-09-09 ENCOUNTER — Other Ambulatory Visit: Payer: Self-pay

## 2017-09-09 DIAGNOSIS — E1140 Type 2 diabetes mellitus with diabetic neuropathy, unspecified: Secondary | ICD-10-CM | POA: Insufficient documentation

## 2017-09-09 DIAGNOSIS — I951 Orthostatic hypotension: Secondary | ICD-10-CM | POA: Diagnosis not present

## 2017-09-09 DIAGNOSIS — Z79899 Other long term (current) drug therapy: Secondary | ICD-10-CM | POA: Insufficient documentation

## 2017-09-09 DIAGNOSIS — I959 Hypotension, unspecified: Secondary | ICD-10-CM | POA: Insufficient documentation

## 2017-09-09 DIAGNOSIS — E1142 Type 2 diabetes mellitus with diabetic polyneuropathy: Secondary | ICD-10-CM | POA: Diagnosis not present

## 2017-09-09 DIAGNOSIS — Z794 Long term (current) use of insulin: Secondary | ICD-10-CM | POA: Insufficient documentation

## 2017-09-09 DIAGNOSIS — R55 Syncope and collapse: Principal | ICD-10-CM | POA: Insufficient documentation

## 2017-09-09 DIAGNOSIS — F17210 Nicotine dependence, cigarettes, uncomplicated: Secondary | ICD-10-CM | POA: Insufficient documentation

## 2017-09-09 DIAGNOSIS — N390 Urinary tract infection, site not specified: Secondary | ICD-10-CM | POA: Insufficient documentation

## 2017-09-09 DIAGNOSIS — E1122 Type 2 diabetes mellitus with diabetic chronic kidney disease: Secondary | ICD-10-CM | POA: Insufficient documentation

## 2017-09-09 DIAGNOSIS — I129 Hypertensive chronic kidney disease with stage 1 through stage 4 chronic kidney disease, or unspecified chronic kidney disease: Secondary | ICD-10-CM | POA: Insufficient documentation

## 2017-09-09 DIAGNOSIS — Z7984 Long term (current) use of oral hypoglycemic drugs: Secondary | ICD-10-CM | POA: Insufficient documentation

## 2017-09-09 DIAGNOSIS — N189 Chronic kidney disease, unspecified: Secondary | ICD-10-CM | POA: Insufficient documentation

## 2017-09-09 LAB — URINALYSIS, MICROSCOPIC (REFLEX)

## 2017-09-09 LAB — COMPREHENSIVE METABOLIC PANEL
ALBUMIN: 2 g/dL — AB (ref 3.5–5.0)
ALK PHOS: 100 U/L (ref 38–126)
ALT: 6 U/L — AB (ref 14–54)
AST: 11 U/L — AB (ref 15–41)
Anion gap: 8 (ref 5–15)
BUN: 24 mg/dL — AB (ref 6–20)
CALCIUM: 9 mg/dL (ref 8.9–10.3)
CHLORIDE: 101 mmol/L (ref 101–111)
CO2: 24 mmol/L (ref 22–32)
CREATININE: 1.54 mg/dL — AB (ref 0.44–1.00)
GFR calc Af Amer: 42 mL/min — ABNORMAL LOW (ref >60)
GFR calc non Af Amer: 36 mL/min — ABNORMAL LOW (ref >60)
GLUCOSE: 433 mg/dL — AB (ref 65–99)
Potassium: 4.6 mmol/L (ref 3.5–5.1)
SODIUM: 133 mmol/L — AB (ref 135–145)
Total Bilirubin: 0.5 mg/dL (ref 0.3–1.2)
Total Protein: 7 g/dL (ref 6.5–8.1)

## 2017-09-09 LAB — GLUCOSE, CAPILLARY
GLUCOSE-CAPILLARY: 170 mg/dL — AB (ref 65–99)
GLUCOSE-CAPILLARY: 165 mg/dL — AB (ref 65–99)

## 2017-09-09 LAB — CBC WITH DIFFERENTIAL/PLATELET
ABS IMMATURE GRANULOCYTES: 0.1 10*3/uL (ref 0.0–0.1)
BASOS ABS: 0.1 10*3/uL (ref 0.0–0.1)
Basophils Relative: 1 %
Eosinophils Absolute: 0.1 10*3/uL (ref 0.0–0.7)
Eosinophils Relative: 1 %
HCT: 31.6 % — ABNORMAL LOW (ref 36.0–46.0)
HEMOGLOBIN: 10 g/dL — AB (ref 12.0–15.0)
IMMATURE GRANULOCYTES: 1 %
LYMPHS PCT: 23 %
Lymphs Abs: 2.9 10*3/uL (ref 0.7–4.0)
MCH: 26.5 pg (ref 26.0–34.0)
MCHC: 31.6 g/dL (ref 30.0–36.0)
MCV: 83.6 fL (ref 78.0–100.0)
MONO ABS: 0.6 10*3/uL (ref 0.1–1.0)
Monocytes Relative: 5 %
NEUTROS ABS: 8.9 10*3/uL — AB (ref 1.7–7.7)
NEUTROS PCT: 69 %
Platelets: 289 10*3/uL (ref 150–400)
RBC: 3.78 MIL/uL — ABNORMAL LOW (ref 3.87–5.11)
RDW: 12.8 % (ref 11.5–15.5)
WBC: 12.7 10*3/uL — AB (ref 4.0–10.5)

## 2017-09-09 LAB — I-STAT TROPONIN, ED: Troponin i, poc: 0 ng/mL (ref 0.00–0.08)

## 2017-09-09 LAB — URINALYSIS, ROUTINE W REFLEX MICROSCOPIC
BILIRUBIN URINE: NEGATIVE
KETONES UR: NEGATIVE mg/dL
Nitrite: POSITIVE — AB
PH: 5.5 (ref 5.0–8.0)
Protein, ur: >300 mg/dL — AB
SPECIFIC GRAVITY, URINE: 1.025 (ref 1.005–1.030)

## 2017-09-09 LAB — CBG MONITORING, ED
GLUCOSE-CAPILLARY: 429 mg/dL — AB (ref 65–99)
Glucose-Capillary: 280 mg/dL — ABNORMAL HIGH (ref 65–99)

## 2017-09-09 MED ORDER — POLYVINYL ALCOHOL 1.4 % OP SOLN
2.0000 [drp] | Freq: Four times a day (QID) | OPHTHALMIC | Status: DC
Start: 2017-09-09 — End: 2017-09-12
  Administered 2017-09-10 – 2017-09-12 (×9): 2 [drp] via OPHTHALMIC
  Filled 2017-09-09: qty 15

## 2017-09-09 MED ORDER — ONDANSETRON HCL 4 MG PO TABS
4.0000 mg | ORAL_TABLET | Freq: Four times a day (QID) | ORAL | Status: DC | PRN
  Administered 2017-09-12: 4 mg via ORAL
  Filled 2017-09-09: qty 1

## 2017-09-09 MED ORDER — SODIUM CHLORIDE 0.9 % IV SOLN
1.0000 g | Freq: Once | INTRAVENOUS | Status: AC
  Administered 2017-09-09: 1 g via INTRAVENOUS
  Filled 2017-09-09: qty 10

## 2017-09-09 MED ORDER — SODIUM CHLORIDE 0.9 % IV BOLUS
1000.0000 mL | Freq: Once | INTRAVENOUS | Status: AC
  Administered 2017-09-09: 1000 mL via INTRAVENOUS

## 2017-09-09 MED ORDER — INSULIN ASPART 100 UNIT/ML ~~LOC~~ SOLN
0.0000 [IU] | Freq: Every day | SUBCUTANEOUS | Status: DC
  Administered 2017-09-10: 3 [IU] via SUBCUTANEOUS

## 2017-09-09 MED ORDER — OXYCODONE HCL 5 MG PO TABS
5.0000 mg | ORAL_TABLET | ORAL | Status: DC | PRN
  Administered 2017-09-09 – 2017-09-12 (×8): 5 mg via ORAL
  Filled 2017-09-09 (×9): qty 1

## 2017-09-09 MED ORDER — CARBOXYMETHYLCELLUL-GLYCERIN 0.5-0.9 % OP SOLN: 2.0000 [drp] | Freq: Four times a day (QID) | OPHTHALMIC | Status: DC

## 2017-09-09 MED ORDER — CEFTRIAXONE SODIUM 1 G IJ SOLR
1.0000 g | Freq: Once | INTRAMUSCULAR | Status: AC
  Administered 2017-09-10: 1 g via INTRAVENOUS
  Filled 2017-09-09: qty 10

## 2017-09-09 MED ORDER — LIDOCAINE 5 % EX OINT
1.0000 "application " | TOPICAL_OINTMENT | Freq: Two times a day (BID) | CUTANEOUS | Status: DC
  Administered 2017-09-09 – 2017-09-12 (×6): 1 via TOPICAL
  Filled 2017-09-09: qty 35.44

## 2017-09-09 MED ORDER — CAPSAICIN 0.025 % EX CREA
TOPICAL_CREAM | Freq: Two times a day (BID) | CUTANEOUS | Status: DC | PRN
  Administered 2017-09-10 – 2017-09-12 (×3): via TOPICAL
  Filled 2017-09-09 (×4): qty 60

## 2017-09-09 MED ORDER — SODIUM CHLORIDE 0.9 % IV BOLUS: 1000.0000 mL | Freq: Once | INTRAVENOUS | Status: DC

## 2017-09-09 MED ORDER — ENOXAPARIN SODIUM 40 MG/0.4ML ~~LOC~~ SOLN
40.0000 mg | SUBCUTANEOUS | Status: DC
  Administered 2017-09-10 – 2017-09-11 (×2): 40 mg via SUBCUTANEOUS
  Filled 2017-09-09 (×2): qty 0.4

## 2017-09-09 MED ORDER — ACETAMINOPHEN 650 MG RE SUPP: 650.0000 mg | Freq: Four times a day (QID) | RECTAL | Status: DC | PRN

## 2017-09-09 MED ORDER — BISACODYL 5 MG PO TBEC: 5.0000 mg | DELAYED_RELEASE_TABLET | Freq: Every day | ORAL | Status: DC | PRN

## 2017-09-09 MED ORDER — INSULIN ASPART 100 UNIT/ML ~~LOC~~ SOLN
0.0000 [IU] | Freq: Three times a day (TID) | SUBCUTANEOUS | Status: DC
  Administered 2017-09-10: 3 [IU] via SUBCUTANEOUS

## 2017-09-09 MED ORDER — INSULIN ASPART 100 UNIT/ML ~~LOC~~ SOLN
10.0000 [IU] | Freq: Once | SUBCUTANEOUS | Status: AC
  Administered 2017-09-09: 10 [IU] via SUBCUTANEOUS
  Filled 2017-09-09: qty 1

## 2017-09-09 MED ORDER — SODIUM CHLORIDE 0.9 % IV SOLN
INTRAVENOUS | Status: DC
  Administered 2017-09-09 – 2017-09-11 (×4): via INTRAVENOUS

## 2017-09-09 MED ORDER — LIDOCAINE 4 % EX CREA
TOPICAL_CREAM | Freq: Once | CUTANEOUS | Status: AC
  Administered 2017-09-09: 1 via TOPICAL
  Filled 2017-09-09: qty 5

## 2017-09-09 MED ORDER — POLYETHYLENE GLYCOL 3350 17 G PO PACK: 17.0000 g | PACK | Freq: Every day | ORAL | Status: DC | PRN

## 2017-09-09 MED ORDER — ACETAMINOPHEN 325 MG PO TABS: 650.0000 mg | ORAL_TABLET | Freq: Four times a day (QID) | ORAL | Status: DC | PRN

## 2017-09-09 MED ORDER — ONDANSETRON HCL 4 MG/2ML IJ SOLN
4.0000 mg | Freq: Four times a day (QID) | INTRAMUSCULAR | Status: DC | PRN
  Administered 2017-09-10: 4 mg via INTRAVENOUS
  Filled 2017-09-09: qty 2

## 2017-09-09 NOTE — H&P (Signed)
[facility] Admission History and Physical Service Pager: [phone]  Patient name: Andrea Glenn Medical record number: [id number] Date of birth: 08-16-1960 Age: 57 y.o. Gender: female  Primary Care Provider: Arvada Bing, DO Consultants: none Code Status: full  Chief Complaint: syncopal episode  Assessment and Plan: Andrea Glenn is a 57 y.o. female presenting with syncopal episode. PMH is significant for T2DM  Syncopal episode- recent admission for same with negative work up except for positive orthostatics. Head CT negative last month as well as normal EEG. Recent carotid doppler and echo without cause for syncope. At last admission early April 2019 some BP meds were stopped. Syncopal episode today likely due to known orthostasis plus UTI/dehydratation from high CBGs. Doubt seizure, history fitting with sudden standing then collapse.   -place in observation, attending Dr. Gwendlyn Deutscher -monitor on telemetry -vitals per floor -repeat orthostatics tomorrow after hydration -hold norvasc, monitor BP closely -PT/OT to see -rehydrate w/ IVF  UTI- UA w/ LE, nitrites, patient symptomatic with urinary frequency denies burning, suprapubic pain, flank pain, fevers, chills. No fever in ED. Leukocytosis to 12.7 in ED, was 14.5 at outpatient visit 2 days ago. -await cultures -continue CTX, switch to orals based on sensitivities -tylenol as needed for fever or pain  T2DM uncontrolled with hyperglycemia- on trulicity at home has not been taking this medication -sensitive SSI, increase based on CBGs -CBGs AC/HS -discuss outpatient medication regimen  No insurance- patient without insurance which is limiting her medication options -c/s CSW   HTN- on norvasc 10 however w/ syncopal episodes and severe orthostasis need to watch BP closely -hold norvasc for now -monitor BP -syncopal work up as above  Elevated Creatinine- Cr 1.54, was 1.77 on outpatient labs  5/22, baseline appears to be around 1.1-1.3, this is likely due to uncontrolled diabetes. -MIVF as above -am BMP  HLD- on problem list, no statin on, has T2DM. Last lipid panel June 2018 -repeat lipid panel  Post-herpetic neuralgia- recent dx of shingles, was rx capsaicin cream -continue capsaicin   FEN/GI: carb modified diet Prophylaxis: lovenox  Disposition: place in observation  History of Present Illness:  Andrea Glenn is a 57 y.o. female presenting with syncopal episode  She reports she has had many syncopal episodes upon standing that no one can figure out the cause from. She was outside today, got up to go inside and felt tingling in her fingers in both hands. She "knew she was going to go down" so she tried to get inside but "fainted and woke up on floor". She cannot recall if she lost urinary continence. She denies biting her tongue. She was home alone. She called EMS and came in.  Urine with LE and nitrites. She denies fevers. Reports constant chills. She reports urinating frequently but no burning. No pain in abdomen or flank.   She reports high sugars, she is on trulicity but missed last dose due to mother passing away. She reports frequent urination. She reports drinking a lot of water. No increased hunger.   Review Of Systems: Per HPI with the following additions:   Review of Systems  Constitutional: Positive for chills and weight loss. Negative for diaphoresis, fever and malaise/fatigue.  HENT: Negative for congestion, sinus pain and sore throat.   Eyes: Negative for blurred vision, double vision, discharge and redness.  Respiratory: Negative for cough, hemoptysis, sputum production, shortness of breath and wheezing.   Cardiovascular: Negative for chest pain and leg swelling.  Gastrointestinal: Negative  for abdominal pain, nausea and vomiting.  Genitourinary: Positive for urgency. Negative for dysuria, flank pain and hematuria.  Musculoskeletal: Positive for falls.  Negative for myalgias.  Skin: Positive for rash (shingles).  Neurological: Positive for dizziness, tingling (upper extremities/fingertips), tremors, loss of consciousness and weakness. Negative for sensory change, speech change, focal weakness, seizures and headaches.  Psychiatric/Behavioral: Negative for depression and substance abuse. The patient is not nervous/anxious.     Patient Active Problem List   Diagnosis Date Noted  . Postherpetic neuralgia 09/08/2017  . Grief 09/08/2017  . Weight loss 09/07/2017  . Tremor   . Acute lower UTI 07/22/2017  . Frequent falls 07/22/2017  . Dehydration 07/22/2017  . Syncope 07/22/2017  . CKD (chronic kidney disease) 07/08/2017  . Anemia of chronic disease 06/21/2017  . Primary hypertension 02/03/2017  . Hematuria 01/20/2017  . Dysuria 01/20/2017  . Lower extremity edema 12/30/2016  . Pancreatitis 10/08/2016  . Hyperlipidemia associated with type 2 diabetes mellitus ([place]) 10/26/2013  . Restless leg syndrome 03/03/2010  . Uncontrolled type 2 diabetes mellitus with peripheral neuropathy ([place]) 06/16/2006  . Tobacco use disorder 06/16/2006    Past Medical History: Past Medical History:  Diagnosis Date  . Diabetes mellitus without complication ([place])   . Edema of both legs   . Hypertension   . Neuropathy     Past Surgical History: Past Surgical History:  Procedure Laterality Date  . BACK SURGERY    . CESAREAN SECTION    . FOOT SURGERY    . SHOULDER SURGERY      Social History: Social History   Tobacco Use  . Smoking status: Current Every Day Smoker    Packs/day: 0.10    Years: 20.00    Pack years: 2.00    Types: Cigarettes    Start date: 47  . Smokeless tobacco: Never Used  . Tobacco comment: working on quitting - 2 per day - 2.26.19  Substance Use Topics  . Alcohol use: No  . Drug use: No   Additional social history: lives with son who works full time  Please also refer to relevant sections of EMR.  Family  History: Family History  Problem Relation Age of Onset  . Kidney failure Mother   . Diabetes Mother   . Thyroid disease Mother   . Hypertension Mother   . Heart failure Mother   . Kidney failure Father   . Cancer Sister   . Stroke Brother   . Cancer Other    Allergies and Medications: Allergies  Allergen Reactions  . Atorvastatin Other (See Comments)    Suicidal thoughts  . Amitriptyline Other (See Comments)    Depression  . Metformin And Related Diarrhea   No current facility-administered medications on file prior to encounter.    Current Outpatient Medications on File Prior to Encounter  Medication Sig Dispense Refill  . acetaminophen (TYLENOL) 500 MG tablet Take 1,000 mg by mouth every 6 (six) hours as needed for headache (pain).    Marland Kitchen amLODipine (NORVASC) 10 MG tablet Take 1 tablet (10 mg total) by mouth daily. 90 tablet 3  . bisacodyl (DULCOLAX) 5 MG EC tablet Take 1 tablet (5 mg total) by mouth daily as needed for moderate constipation. (Patient not taking: Reported on 08/17/2017) 30 tablet 0  . capsaicin (ZOSTRIX) 0.025 % cream Apply topically 2 (two) times daily. 60 g 0  . Dulaglutide (TRULICITY) 9.44 HQ/7.5FF SOPN Inject 0.75 mg into the skin once a week. (Patient taking differently: Inject 0.75 mg  into the skin every Tuesday. )    . iron polysaccharides (NIFEREX) 150 MG capsule Take 1 capsule (150 mg total) by mouth daily. 30 capsule 0  . lidocaine (XYLOCAINE) 5 % ointment Apply 1 application topically as needed. 35.44 g 0  . nicotine (NICODERM CQ - DOSED IN MG/24 HR) 7 mg/24hr patch Place 1 patch (7 mg total) onto the skin daily. (Patient not taking: Reported on 07/18/2017) 28 patch 2  . OVER THE COUNTER MEDICATION Place 1 drop into both eyes 4 (four) times daily as needed (dry eyes/irritation). Over the counter lubricating eye drop      Objective: BP (!) 154/71   Pulse 85   Temp 98.3 F (36.8 C) (Oral)   Resp 19   Ht 5\' 4"  (1.626 m)   Wt 109 lb (49.4 kg)   SpO2  100%   BMI 18.71 kg/m  Exam: General: well appearing in NAD Eyes: color contacts in, EOMI ENTM: MMM Neck: supple, no LAD Cardiovascular: RRR, no MRG Respiratory: CTAB. Normal work of breathing. Gastrointestinal: soft, non-tender, non-distended, +BS MSK: no edema or cyanosis Derm: scattered crusted pustules over left flank, no erythema, no discharge Neuro: no focal deficits. Intermittent tremor in hands bilaterally. Strength 4/5 in upper and lower extremities bilaterally  Psych: mood is anxious, affect appropriate  Labs and Imaging: CBC BMET  Recent Labs  Lab 09/09/17 1343  WBC 12.7*  HGB 10.0*  HCT 31.6*  PLT 289   Recent Labs  Lab 09/09/17 1343  NA 133*  K 4.6  CL 101  CO2 24  BUN 24*  CREATININE 1.54*  GLUCOSE 433*  CALCIUM 9.0     Urinalysis    Component Value Date/Time   COLORURINE YELLOW 09/09/2017 1453   APPEARANCEUR TURBID (A) 09/09/2017 1453   LABSPEC 1.025 09/09/2017 1453   PHURINE 5.5 09/09/2017 1453   GLUCOSEU >=500 (A) 09/09/2017 1453   HGBUR MODERATE (A) 09/09/2017 1453   BILIRUBINUR NEGATIVE 09/09/2017 1453   BILIRUBINUR negative 08/17/2017 0939   KETONESUR NEGATIVE 09/09/2017 1453   PROTEINUR >300 (A) 09/09/2017 1453   UROBILINOGEN 0.2 08/17/2017 0939   UROBILINOGEN 0.2 07/28/2013 0008   NITRITE POSITIVE (A) 09/09/2017 1453   LEUKOCYTESUR MODERATE (A) 09/09/2017 1453      Steve Rattler, DO 09/09/2017, 4:38 PM PGY-2, [place] [facility] pager: [phone], text pages welcome

## 2017-09-09 NOTE — ED Notes (Signed)
Attempted to call report x 1  

## 2017-09-09 NOTE — ED Provider Notes (Signed)
[facility] EMERGENCY DEPARTMENT Provider Note   CSN: [id number] Arrival date & time: 09/09/17  1256     History   Chief Complaint Chief Complaint  Patient presents with  . Loss of Consciousness    HPI Andrea Glenn is a 57 y.o. female.  HPI Andrea Glenn is a 57 y.o. female with history of hypertension, diabetes, presents to emergency department complaining of a syncopal episode.  Patient states that she was sitting on the porch this morning, states she got up to go into the house and when she got to the door she started having tingling in the right fingertips and fainted.  Woke up on the floor.  She is currently complaining of generalized weakness and fatigue.  She is also complaining of pain to the left flank where she was diagnosed with shingles. Pt apparently refused gabapentin and lyrica, and insurance wouldn't cover lidocaine cream.  Patient states that she has been dealing with orthostatic hypotension since February, and believes her blood pressure medications are responsible for it.  She states that she has been worked up for it in the past and that no one can figure out the reason for her syncopal episodes.  Past Medical History:  Diagnosis Date  . Diabetes mellitus without complication ([place])   . Edema of both legs   . Hypertension   . Neuropathy     Patient Active Problem List   Diagnosis Date Noted  . Postherpetic neuralgia 09/08/2017  . Grief 09/08/2017  . Weight loss 09/07/2017  . Tremor   . Acute lower UTI 07/22/2017  . Frequent falls 07/22/2017  . Dehydration 07/22/2017  . Syncope 07/22/2017  . CKD (chronic kidney disease) 07/08/2017  . Anemia of chronic disease 06/21/2017  . Primary hypertension 02/03/2017  . Hematuria 01/20/2017  . Dysuria 01/20/2017  . Lower extremity edema 12/30/2016  . Pancreatitis 10/08/2016  . Hyperlipidemia associated with type 2 diabetes mellitus ([place]) 10/26/2013  . Restless leg syndrome 03/03/2010  .  Uncontrolled type 2 diabetes mellitus with peripheral neuropathy ([place]) 06/16/2006  . Tobacco use disorder 06/16/2006    Past Surgical History:  Procedure Laterality Date  . BACK SURGERY    . CESAREAN SECTION    . FOOT SURGERY    . SHOULDER SURGERY       OB History   None      Home Medications    Prior to Admission medications   Medication Sig Start Date End Date Taking? Authorizing Provider  acetaminophen (TYLENOL) 500 MG tablet Take 1,000 mg by mouth every 6 (six) hours as needed for headache (pain).    [provider]  amLODipine (NORVASC) 10 MG tablet Take 1 tablet (10 mg total) by mouth daily. 08/22/17   Dacoma Bing, DO  bisacodyl (DULCOLAX) 5 MG EC tablet Take 1 tablet (5 mg total) by mouth daily as needed for moderate constipation. Patient not taking: Reported on 08/17/2017 07/25/17   Patrecia Pour, Christean Grief, MD  capsaicin (ZOSTRIX) 0.025 % cream Apply topically 2 (two) times daily. 09/07/17   Mikell, Jeani Sow, MD  Dulaglutide (TRULICITY) 2.12 YQ/8.2NO SOPN Inject 0.75 mg into the skin once a week. Patient taking differently: Inject 0.75 mg into the skin every Tuesday.  05/19/17   Zenia Resides, MD  iron polysaccharides (NIFEREX) 150 MG capsule Take 1 capsule (150 mg total) by mouth daily. 07/25/17   Doreatha Lew, MD  lidocaine (XYLOCAINE) 5 % ointment Apply 1 application topically as needed. 09/07/17  Mikell, Jeani Sow, MD  nicotine (NICODERM CQ - DOSED IN MG/24 HR) 7 mg/24hr patch Place 1 patch (7 mg total) onto the skin daily. Patient not taking: Reported on 07/18/2017 05/19/17   Zenia Resides, MD  OVER THE COUNTER MEDICATION Place 1 drop into both eyes 4 (four) times daily as needed (dry eyes/irritation). Over the counter lubricating eye drop    [provider]    Family History Family History  Problem Relation Age of Onset  . Kidney failure Mother   . Diabetes Mother   . Thyroid disease Mother   . Hypertension Mother   . Heart failure  Mother   . Kidney failure Father   . Cancer Sister   . Stroke Brother   . Cancer Other     Social History Social History   Tobacco Use  . Smoking status: Current Every Day Smoker    Packs/day: 0.10    Years: 20.00    Pack years: 2.00    Types: Cigarettes    Start date: 32  . Smokeless tobacco: Never Used  . Tobacco comment: working on quitting - 2 per day - 2.26.19  Substance Use Topics  . Alcohol use: No  . Drug use: No     Allergies   Atorvastatin; Amitriptyline; and Metformin and related   Review of Systems Review of Systems  Constitutional: Negative for chills and fever.  Respiratory: Negative for cough, chest tightness and shortness of breath.   Cardiovascular: Negative for chest pain, palpitations and leg swelling.  Gastrointestinal: Negative for abdominal pain, diarrhea, nausea and vomiting.  Genitourinary: Negative for dysuria, flank pain, pelvic pain, vaginal bleeding, vaginal discharge and vaginal pain.  Musculoskeletal: Negative for arthralgias, myalgias, neck pain and neck stiffness.  Skin: Negative for rash.  Neurological: Positive for dizziness, syncope, weakness and light-headedness. Negative for headaches.  All other systems reviewed and are negative.    Physical Exam Updated Vital Signs BP 121/73   Pulse 80   Temp 98.3 F (36.8 C) (Oral)   Resp 16   Ht 5\' 4"  (1.626 m)   Wt 49.4 kg (109 lb)   SpO2 100%   BMI 18.71 kg/m   Physical Exam  Constitutional: She is oriented to person, place, and time. She appears well-developed and well-nourished. No distress.  HENT:  Head: Normocephalic.  Eyes: Conjunctivae are normal.  Neck: Neck supple.  Cardiovascular: Normal rate, regular rhythm and normal heart sounds.  Pulmonary/Chest: Effort normal and breath sounds normal. No respiratory distress. She has no wheezes. She has no rales.  Abdominal: Soft. Bowel sounds are normal. She exhibits no distension. There is no tenderness. There is no rebound.    Musculoskeletal: She exhibits no edema.  Neurological: She is alert and oriented to person, place, and time.  Skin: Skin is warm and dry.  Psychiatric: She has a normal mood and affect. Her behavior is normal.  Nursing note and vitals reviewed.    ED Treatments / Results  Labs (all labs ordered are listed, but only abnormal results are displayed) Labs Reviewed  CBC WITH DIFFERENTIAL/PLATELET - Abnormal; Notable for the following components:      Result Value   WBC 12.7 (*)    RBC 3.78 (*)    Hemoglobin 10.0 (*)    HCT 31.6 (*)    Neutro Abs 8.9 (*)    All other components within normal limits  COMPREHENSIVE METABOLIC PANEL - Abnormal; Notable for the following components:   Sodium 133 (*)  Glucose, Bld 433 (*)    BUN 24 (*)    Creatinine, Ser 1.54 (*)    Albumin 2.0 (*)    AST 11 (*)    ALT 6 (*)    GFR calc non Af Amer 36 (*)    GFR calc Af Amer 42 (*)    All other components within normal limits  URINALYSIS, ROUTINE W REFLEX MICROSCOPIC - Abnormal; Notable for the following components:   APPearance TURBID (*)    Glucose, UA >=500 (*)    Hgb urine dipstick MODERATE (*)    Protein, ur >300 (*)    Nitrite POSITIVE (*)    Leukocytes, UA MODERATE (*)    All other components within normal limits  URINALYSIS, MICROSCOPIC (REFLEX) - Abnormal; Notable for the following components:   Bacteria, UA MANY (*)    All other components within normal limits  CBG MONITORING, ED - Abnormal; Notable for the following components:   Glucose-Capillary 429 (*)    All other components within normal limits  CBG MONITORING, ED - Abnormal; Notable for the following components:   Glucose-Capillary 280 (*)    All other components within normal limits  URINE CULTURE  I-STAT TROPONIN, ED    EKG EKG Interpretation  Date/Time:  Friday Sep 09 2017 13:06:12 EDT Ventricular Rate:  83 PR Interval:    QRS Duration: 79 QT Interval:  376 QTC Calculation: 442 R Axis:   66 Text  Interpretation:  Sinus rhythm Minimal ST elevation, anterior leads Early repolarization pattern No significant change since last tracing Confirmed by Orlie Dakin [phone]) on 09/09/2017 1:10:08 PM   Radiology No results found.  Procedures Procedures (including critical care time)  Medications Ordered in ED Medications  sodium chloride 0.9 % bolus 1,000 mL (1,000 mLs Intravenous New Bag/Given 09/09/17 1359)  insulin aspart (novoLOG) injection 10 Units (10 Units Subcutaneous Given 09/09/17 1358)     Initial Impression / Assessment and Plan / ED Course  I have reviewed the triage vital signs and the nursing notes.  Pertinent labs & imaging results that were available during my care of the patient were reviewed by me and considered in my medical decision making (see chart for details).     Pt with episode of syncope. She feels weak, fatigued, dizzy. Will check orthostatics, labs, ecg, urine.    3:54 PM Patient is significantly orthostatic with blood pressure dropping from 209 systolic to 80 upon standing.  IV fluids started.  Her blood sugar is over 400, 10 units of insulin given subq.  Labs otherwise with no significant abnormalities.  Urinalysis shows significant infection.  I will send cultures.  Rocephin ordered.  Given hypotension, generalized weakness, hyperglycemia, UTI, will admit.   Spoke with family practice, will admit.  Vitals:   09/09/17 1311 09/09/17 1330 09/09/17 1400 09/09/17 1445  BP: 121/73 128/74 (!) 148/82 (!) 160/92  Pulse: 80 82 82 91  Resp: 16 12 15 14   Temp: 98.3 F (36.8 C)     TempSrc: Oral     SpO2: 100% 100% 100% 100%  Weight:      Height:         Final Clinical Impressions(s) / ED Diagnoses   Final diagnoses:  Syncope, unspecified syncope type  Urinary tract infection without hematuria, site unspecified  Orthostatic hypotension    ED Discharge Orders    None       Jeannett Senior, PA-C 09/09/17 1559    Orlie Dakin,  MD 09/09/17 [phone]

## 2017-09-09 NOTE — ED Provider Notes (Signed)
Patient reports feeling lightheaded with standing for several days. She admits to occasionally missing her diabetic medication. Other complaints include polyuria and polydipsia. On exam chronically ill-appearing alert Glasgow Coma Score 40mucous membranesdry Lungs clear auscultation heart regular rate and rhythm abdomen nondistended nontender   Andrea Dakin, MD 09/09/17 1545

## 2017-09-09 NOTE — ED Notes (Signed)
Fall risk arm band placed with yellow socks

## 2017-09-09 NOTE — ED Triage Notes (Signed)
Pt brought in by [facility] from home for syncopal episode this am. Pt states she woke up on her back on her front porch, states was down about 30 min. Per EMS pt has hx of diabetes, CBG 558. Pt also has hx of hypotension, per EMS pt initial systolic around 80, after 800cc NS 106/70. Pt also has hx of shingles, was seen by her PCP for same and was given cream for tx. Pt A+Ox4 on arrival to ED, c/o back and chest pain.

## 2017-09-10 DIAGNOSIS — N390 Urinary tract infection, site not specified: Secondary | ICD-10-CM | POA: Diagnosis not present

## 2017-09-10 DIAGNOSIS — R55 Syncope and collapse: Secondary | ICD-10-CM | POA: Diagnosis not present

## 2017-09-10 DIAGNOSIS — I951 Orthostatic hypotension: Secondary | ICD-10-CM | POA: Diagnosis not present

## 2017-09-10 LAB — BASIC METABOLIC PANEL
Anion gap: 6 (ref 5–15)
BUN: 17 mg/dL (ref 6–20)
CALCIUM: 8.8 mg/dL — AB (ref 8.9–10.3)
CO2: 24 mmol/L (ref 22–32)
Chloride: 109 mmol/L (ref 101–111)
Creatinine, Ser: 1.06 mg/dL — ABNORMAL HIGH (ref 0.44–1.00)
GFR calc Af Amer: >60 mL/min (ref >60)
GFR, EST NON AFRICAN AMERICAN: 57 mL/min — AB (ref >60)
GLUCOSE: 233 mg/dL — AB (ref 65–99)
Potassium: 4 mmol/L (ref 3.5–5.1)
Sodium: 139 mmol/L (ref 135–145)

## 2017-09-10 LAB — GLUCOSE, CAPILLARY
GLUCOSE-CAPILLARY: 248 mg/dL — AB (ref 65–99)
GLUCOSE-CAPILLARY: 82 mg/dL (ref 65–99)
GLUCOSE-CAPILLARY: 281 mg/dL — AB (ref 65–99)
Glucose-Capillary: 181 mg/dL — ABNORMAL HIGH (ref 65–99)

## 2017-09-10 LAB — CBC
HCT: 29 % — ABNORMAL LOW (ref 36.0–46.0)
Hemoglobin: 9 g/dL — ABNORMAL LOW (ref 12.0–15.0)
MCH: 26.3 pg (ref 26.0–34.0)
MCHC: 31 g/dL (ref 30.0–36.0)
MCV: 84.8 fL (ref 78.0–100.0)
Platelets: 265 10*3/uL (ref 150–400)
RBC: 3.42 MIL/uL — ABNORMAL LOW (ref 3.87–5.11)
RDW: 12.9 % (ref 11.5–15.5)
WBC: 11.2 10*3/uL — ABNORMAL HIGH (ref 4.0–10.5)

## 2017-09-10 LAB — LIPID PANEL
CHOL/HDL RATIO: 6.7 ratio
Cholesterol: 215 mg/dL — ABNORMAL HIGH (ref 0–200)
HDL: 32 mg/dL — AB (ref >40)
LDL CALC: 131 mg/dL — AB (ref 0–99)
Triglycerides: 260 mg/dL — ABNORMAL HIGH (ref <150)
VLDL: 52 mg/dL — ABNORMAL HIGH (ref 0–40)

## 2017-09-10 MED ORDER — AMLODIPINE BESYLATE 10 MG PO TABS
10.0000 mg | ORAL_TABLET | Freq: Every day | ORAL | Status: DC
  Administered 2017-09-10 – 2017-09-12 (×3): 10 mg via ORAL
  Filled 2017-09-10 (×3): qty 1

## 2017-09-10 MED ORDER — INSULIN ASPART 100 UNIT/ML ~~LOC~~ SOLN
0.0000 [IU] | Freq: Three times a day (TID) | SUBCUTANEOUS | Status: DC
  Administered 2017-09-10: 3 [IU] via SUBCUTANEOUS
  Administered 2017-09-11 (×2): 5 [IU] via SUBCUTANEOUS
  Administered 2017-09-11: 3 [IU] via SUBCUTANEOUS
  Administered 2017-09-12: 8 [IU] via SUBCUTANEOUS

## 2017-09-10 MED ORDER — PROMETHAZINE HCL 25 MG/ML IJ SOLN
25.0000 mg | Freq: Once | INTRAMUSCULAR | Status: AC
  Administered 2017-09-10: 25 mg via INTRAVENOUS
  Filled 2017-09-10: qty 1

## 2017-09-10 MED ORDER — ENSURE ENLIVE PO LIQD
237.0000 mL | Freq: Two times a day (BID) | ORAL | Status: DC
  Administered 2017-09-10 – 2017-09-11 (×3): 237 mL via ORAL

## 2017-09-10 NOTE — Care Management Note (Signed)
Case Management Note  Patient Details  Name: Andrea Glenn MRN: [id number] Date of Birth: 05/05/1960  Subjective/Objective:                 Spoke w patient at bedside. She states she lives w grandson. [facility] She states they help her obtain her trulicity as she is uninsured. She states she is in the process of getting her orange card. Otherwise she pays out of pocket for her Rxs. Watch for Dwight needs at DC.    Action/Plan:   Expected Discharge Date:                  Expected Discharge Plan:  Home/Self Care  In-House Referral:     Discharge planning Services  CM Consult  Post Acute Care Choice:    Choice offered to:     DME Arranged:    DME Agency:     HH Arranged:    HH Agency:     Status of Service:  In process, will continue to follow  If discussed at Long Length of Stay Meetings, dates discussed:    Additional Comments:  Carles Collet, RN 09/10/2017, 1:54 PM

## 2017-09-10 NOTE — Evaluation (Signed)
Physical Therapy Evaluation Patient Details Name: Andrea Glenn MRN: [id number] DOB: 16-Sep-1960 Today's Date: 09/10/2017   History of Present Illness  Pt admitted after syncopal episode at home on her front porch. PMH: DM, smoker, hypotension, possible hypothyroidism, tremors since 2/19. MRI neg and ECHO normal. Orthostatics positive.   Clinical Impression  Pt admitted with above diagnosis. Pt currently with functional limitations due to the deficits listed below (see PT Problem List). Pt reports many falls in past several months as many as 10 in a day she states. She has begun to be scared to mobilize. Stood EOB with RW today with min A but became nauseous and began vomiting and could not tolerate further mobility. BP elevated with changes in position on eval. Will follow acutely and would benefit from post acute rehab at home.  Pt will benefit from skilled PT to increase their independence and safety with mobility to allow discharge to the venue listed below.      Follow Up Recommendations Home health PT;Supervision/Assistance - 24 hour    Equipment Recommendations  None recommended by PT    Recommendations for Other Services       Precautions / Restrictions Precautions Precautions: Fall Restrictions Weight Bearing Restrictions: No      Mobility  Bed Mobility Overal bed mobility: Needs Assistance Bed Mobility: Supine to Sit;Sit to Supine     Supine to sit: Min guard Sit to supine: Min guard   General bed mobility comments: Min Guard A for safety  Transfers Overall transfer level: Needs assistance Equipment used: Rolling walker (2 wheeled) Transfers: Sit to/from Stand Sit to Stand: Min assist         General transfer comment: Min A for [employer] management  Ambulation/Gait Ambulation/Gait assistance: Min assist Ambulation Distance (Feet): 2 Feet Assistive device: Rolling walker (2 wheeled) Gait Pattern/deviations: Trunk flexed;Step-to pattern     General Gait  Details: pt began to vomit in standing so could only manage stepping to [facility] before sitting back down  Stairs            Wheelchair Mobility    Modified Rankin (Stroke Patients Only)       Balance Overall balance assessment: Mild deficits observed, not formally tested                                           Pertinent Vitals/Pain Pain Assessment: Faces Faces Pain Scale: Hurts even more Pain Location: Left side of trunk. "I got the shingles" Pain Descriptors / Indicators: Constant;Discomfort;Grimacing Pain Intervention(s): Monitored during session    Home Living Family/patient expects to be discharged to:: Private residence Living Arrangements: Other relatives Available Help at Discharge: Family;Available PRN/intermittently Type of Home: House Home Access: Stairs to enter Entrance Stairs-Rails: Right Entrance Stairs-Number of Steps: 6 Home Layout: One level Home Equipment: Walker - 2 wheels;Grab bars - tub/shower Additional Comments: Lives with grandson but he works. Has recently been staying with her daughter who has less STE but has low toilets. Her house is on a hill with paved walkway. Passed out >10 times due to syncopal episodes with falls since Feb    Prior Function Level of Independence: Independent with assistive device(s)         Comments: Pt reports she uses a cane and RW for functional mobility. Performing ADLs and family does IADLs     Hand Dominance   Dominant Hand: (ambidextrous  per patient)    Extremity/Trunk Assessment   Upper Extremity Assessment Upper Extremity Assessment: Defer to OT evaluation    Lower Extremity Assessment Lower Extremity Assessment: Generalized weakness    Cervical / Trunk Assessment Cervical / Trunk Assessment: Kyphotic  Communication   Communication: No difficulties  Cognition Arousal/Alertness: Awake/alert Behavior During Therapy: WFL for tasks assessed/performed Overall Cognitive Status:  Within Functional Limits for tasks assessed                                        General Comments General comments (skin integrity, edema, etc.): RN came and assessed pt while she was vomiting and went to get meds. Pt's BP elevated with each position change from systolic of 161'W supine to 181 in standing, likely due to vomiting    Exercises General Exercises - Lower Extremity Ankle Circles/Pumps: AROM;Both;20 reps;Seated Hip Flexion/Marching: AROM;Both;10 reps;Seated   Assessment/Plan    PT Assessment Patient needs continued PT services  PT Problem List Decreased activity tolerance;Decreased balance;Decreased mobility;Decreased knowledge of precautions;Pain       PT Treatment Interventions DME instruction;Gait training;Stair training;Functional mobility training;Therapeutic activities;Therapeutic exercise;Balance training;Neuromuscular re-education;Patient/family education    PT Goals (Current goals can be found in the Care Plan section)  Acute Rehab PT Goals Patient Stated Goal: Stop pain PT Goal Formulation: With patient Time For Goal Achievement: 09/24/17 Potential to Achieve Goals: Good    Frequency Min 3X/week   Barriers to discharge        Co-evaluation PT/OT/SLP Co-Evaluation/Treatment: Yes Reason for Co-Treatment: Complexity of the patient's impairments (multi-system involvement);To address functional/ADL transfers PT goals addressed during session: Mobility/safety with mobility;Balance;Proper use of DME OT goals addressed during session: ADL's and self-care       AM-PAC PT "6 Clicks" Daily Activity  Outcome Measure Difficulty turning over in bed (including adjusting bedclothes, sheets and blankets)?: A Little Difficulty moving from lying on back to sitting on the side of the bed? : A Little Difficulty sitting down on and standing up from a chair with arms (e.g., wheelchair, bedside commode, etc,.)?: Unable Help needed moving to and from a bed  to chair (including a wheelchair)?: A Little Help needed walking in hospital room?: A Lot Help needed climbing 3-5 steps with a railing? : A Lot 6 Click Score: 14    End of Session   Activity Tolerance: Treatment limited secondary to medical complications (Comment)(N and V) Patient left: in bed;with call bell/phone within reach;with bed alarm set Nurse Communication: Mobility status PT Visit Diagnosis: Unsteadiness on feet (R26.81);Repeated falls (R29.6);Muscle weakness (generalized) (M62.81);Difficulty in walking, not elsewhere classified (R26.2);Pain Pain - Right/Left: Left Pain - part of body: (mid back and abdomen)    Time: 9604-5409 PT Time Calculation (min) (ACUTE ONLY): 34 min   Charges:   PT Evaluation $PT Eval Moderate Complexity: 1 Mod     PT G Codes:        Leighton Roach, PT  Acute Rehab Services  [place] 09/10/2017, 2:37 PM

## 2017-09-10 NOTE — Progress Notes (Signed)
Pt had a large amount of brown color emesis.  Administered one time dose of phenergan with good effect.  Idolina Primer, RN

## 2017-09-10 NOTE — Evaluation (Addendum)
Occupational Therapy Evaluation Patient Details Name: Andrea Glenn MRN: [id number] DOB: 08-01-1960 Today's Date: 09/10/2017    History of Present Illness Pt admitted after syncopal episode at home on her front porch. PMH: DM, smoker, hypotension, possible hypothyroidism, tremors since 2/19. MRI neg and ECHO normal. Orthostatics positive.    Clinical Impression   PTA, pt was living with her grandson and was performing BADLs. Pt currently requiring Mod-Max A for LB ADLs and Min A for functional mobility with RW. Pt presenting with decreased functional performance due to pain, generalized weakness, and nausea. With each change in position (from supine to standing), pt's BP elevating. Of note, pt becoming nauseous while standing and vomiting; RN notified. Pt would benefit from further acute OT to facilitate safe dc. Recommend dc home once medically stable per physician.    Follow Up Recommendations  No OT follow up;Supervision/Assistance - 24 hour    Equipment Recommendations  None recommended by OT    Recommendations for Other Services PT consult     Precautions / Restrictions Precautions Precautions: Fall Restrictions Weight Bearing Restrictions: No      Mobility Bed Mobility Overal bed mobility: Needs Assistance Bed Mobility: Supine to Sit;Sit to Supine     Supine to sit: Min guard Sit to supine: Min guard   General bed mobility comments: Min Guard A for safety  Transfers Overall transfer level: Needs assistance Equipment used: Rolling walker (2 wheeled) Transfers: Sit to/from Stand Sit to Stand: Min assist         General transfer comment: Min A for RW management    Balance Overall balance assessment: Mild deficits observed, not formally tested                                         ADL either performed or assessed with clinical judgement   ADL Overall ADL's : Needs assistance/impaired Eating/Feeding: Set up;Sitting   Grooming: Set  up;Supervision/safety;Sitting Grooming Details (indicate cue type and reason): Pt using mouth wash at EOB and rinsing mouth out after vomiting.  Upper Body Bathing: Sitting;Set up;Supervision/ safety   Lower Body Bathing: Moderate assistance;Sit to/from stand   Upper Body Dressing : Minimal assistance;Sitting Upper Body Dressing Details (indicate cue type and reason): Min A to don new down. Min A for assistane in donning left sleeve due to pain. Lower Body Dressing: Maximal assistance;Sit to/from stand   Toilet Transfer: Minimal assistance;Ambulation;RW(Simulated to recliner)           Functional mobility during ADLs: Minimal assistance;Rolling walker(Min A for RW management) General ADL Comments: Pt with limited functional performance due to pain at left side and nausea. Feel pt will progress well with time     Vision Baseline Vision/History: (Blurry vision)       Perception     Praxis      Pertinent Vitals/Pain Pain Assessment: Faces Faces Pain Scale: Hurts even more Pain Location: Left side of trunk. "I got the shingles" Pain Descriptors / Indicators: Constant;Discomfort;Grimacing Pain Intervention(s): Monitored during session;Repositioned     Hand Dominance (ambidextrous per patient)   Extremity/Trunk Assessment Upper Extremity Assessment Upper Extremity Assessment: Generalized weakness   Lower Extremity Assessment Lower Extremity Assessment: Defer to PT evaluation   Cervical / Trunk Assessment Cervical / Trunk Assessment: Kyphotic   Communication Communication Communication: No difficulties   Cognition Arousal/Alertness: Awake/alert Behavior During Therapy: WFL for tasks assessed/performed Overall Cognitive Status: Within  Functional Limits for tasks assessed                                     General Comments  Pt nauseous and vomitting at end of session    Exercises     Shoulder Instructions      Home Living Family/patient expects  to be discharged to:: Private residence Living Arrangements: Other relatives(Grandson) Available Help at Discharge: Family;Available PRN/intermittently(Grandson works) Type of Home: House Home Access: Stairs to enter Technical brewer of Steps: 6 Entrance Stairs-Rails: Right Home Layout: One level     Bathroom Shower/Tub: Occupational psychologist: [place]: Environmental consultant - 2 wheels;Grab bars - tub/shower   Additional Comments: House on a hill with paved walkway. Passed out >10 times due to syncopal episodes with falls since Feb      Prior Functioning/Environment Level of Independence: Independent with assistive device(s)        Comments: Pt reports she uses a cane and RW for functional mobility. Performing ADLs and family does IADLs        OT Problem List: Decreased range of motion;Decreased activity tolerance;Impaired balance (sitting and/or standing);Decreased knowledge of use of DME or AE;Decreased knowledge of precautions;Pain      OT Treatment/Interventions: Self-care/ADL training;Therapeutic exercise;Energy conservation;DME and/or AE instruction;Therapeutic activities;Patient/family education    OT Goals(Current goals can be found in the care plan section) Acute Rehab OT Goals Patient Stated Goal: Stop pain OT Goal Formulation: With patient Time For Goal Achievement: 09/24/17 Potential to Achieve Goals: Good ADL Goals Pt Will Perform Grooming: with supervision;with set-up;standing Pt Will Perform Upper Body Dressing: with set-up;with supervision;sitting Pt Will Perform Lower Body Dressing: with supervision;with set-up;sit to/from stand Pt Will Transfer to Toilet: with set-up;with supervision;ambulating;regular height toilet  OT Frequency: Min 2X/week   Barriers to D/C:            Co-evaluation PT/OT/SLP Co-Evaluation/Treatment: Yes Reason for Co-Treatment: Complexity of the patient's impairments (multi-system involvement);To address  functional/ADL transfers   OT goals addressed during session: ADL's and self-care      AM-PAC PT "6 Clicks" Daily Activity     Outcome Measure Help from another person eating meals?: None Help from another person taking care of personal grooming?: A Little Help from another person toileting, which includes using toliet, bedpan, or urinal?: A Little Help from another person bathing (including washing, rinsing, drying)?: A Little Help from another person to put on and taking off regular upper body clothing?: A Little Help from another person to put on and taking off regular lower body clothing?: A Lot 6 Click Score: 18   End of Session Equipment Utilized During Treatment: Rolling walker Nurse Communication: Mobility status;Precautions  Activity Tolerance: Patient limited by pain Patient left: in bed;with call bell/phone within reach;with nursing/sitter in room  OT Visit Diagnosis: Unsteadiness on feet (R26.81);Other abnormalities of gait and mobility (R26.89);Muscle weakness (generalized) (M62.81);Other symptoms and signs involving cognitive function;Pain Pain - Right/Left: Left Pain - part of body: (Trunk)                Time: 8127-5170 OT Time Calculation (min): 33 min Charges:  OT General Charges $OT Visit: 1 Visit OT Evaluation $OT Eval Moderate Complexity: 1 Mod G-Codes:     Beren Yniguez MSOT, OTR/L Acute Rehab Pager: [phone] Office: [place] 09/10/2017, 1:57 PM

## 2017-09-10 NOTE — Progress Notes (Signed)
Family Medicine Teaching Service Daily Progress Note Intern Pager: [phone]  Patient name: DONDRA RHETT Medical record number: [id number] Date of birth: 1961/01/15 Age: 57 y.o. Gender: female  Primary Care Provider: Gillis Bing, DO Consultants: None Code Status: FULL   Pt Overview and Major Events to Date:  Admit 5/24 CTX (5/24-  )  Assessment and Plan: Andrea Glenn is a 57 y.o. female presenting with syncopal episode. PMH is significant for T2DM, HTN and recent history of shingles.    Syncopal episode- appears recurrent, recent negative workup on admission including EEG, CT and MRI head and brain.  Recent carotid doppler and echo without cause for syncope. Positive for orthostatic hypotension however making this likely cause.  At last admission early April 2019 some BP meds were stopped. Syncopal episode is probably due to known orthostasis plus UTI/dehydratation from high CBGs. Doubt seizure, history fitting with sudden standing then collapse.  Blood pressures have been elevated to 601-093 systolics and restarted her home Norvasc 10 mg daily this AM.  -monitor on tele -will decrease IVF to 75 cc.hr  -repeat orthostatics today -PT/OT to see -vitals per floor  UTI- Leukocytosis improving 12.7>11.2 and has remained afebrile.  UA w/ LE, nitrites, pt symptomatic with urinary frequency denies burning, suprapubic pain, flank pain, fevers, chills.  -await cx -continue CTX, switch to orals based on sensitivities -tylenol as needed for fever or pain  T2DM uncontrolled with hyperglycemia- on trulicity at home has not been taking this medication.  Elevated CBGs of 170-280.  -increase to mod SSI  -CBGs AC/HS -discuss outpatient medication regimen  No insurance- patient without insurance which is limiting her medication options -c/s CSW   HTN- BP this AM 163/73.  At home on Norvasc 10 mg daily, initially held for syncopal episodes and severe orthostasis but now restarted.   -monitor BP  Elevated Creatinine-improving 1.54>1.06 which appears to be her baseline.  -MIVF as above -monitor on daily bmp  HLD- on problem list, no statin on, has T2DM.  Lipid panel on admission with LDL 131, HDL 32, TG 260.    Post-herpetic neuralgia- recent history of shingles, without active lesions -continue capsaicin and lidocaine cream   FEN/GI: carb modified diet Prophylaxis: lovenox   Disposition: inpatient for syncopal workup, dispo pending clinical improvement   Subjective:  Feels tired and endorses nausea but has not vomited.  She feels about the same as she did yesterday.  Denies SOB, leg swelling.  No acute overnight events.  Hypertensive this morning.   Objective: Temp:  [98.3 F (36.8 C)-99.3 F (37.4 C)] 98.4 F (36.9 C) (05/25 0844) Pulse Rate:  [77-91] 77 (05/25 0844) Resp:  [12-22] 20 (05/25 0844) BP: (121-171)/(63-96) 167/84 (05/25 1059) SpO2:  [98 %-100 %] 98 % (05/25 0844) Weight:  [109 lb (49.4 kg)] 109 lb (49.4 kg) (05/24 1830)   Physical Exam: General: asleep but awakens easily for exam, NAD  Eyes: colored contacts, EOMI ENTM: MMM, o/p clear  Neck: supple, no LAD Cardiovascular: RRR, no MRG, 2+ pedal pulses  Respiratory: CTAB. Normal work of breathing. Gastrointestinal: soft, NTND, +BS MSK: no edema or tenderness noted  Derm: scattered dry and crusted pustules over left flank, no drainage  Neuro: Alert, no focal deficits   Laboratory: Recent Labs  Lab 09/07/17 1711 09/09/17 1343 09/10/17 0423  WBC 14.5* 12.7* 11.2*  HGB 10.5* 10.0* 9.0*  HCT 33.5* 31.6* 29.0*  PLT 297 289 265   Recent Labs  Lab 09/07/17 1711 09/09/17 1343 09/10/17  0423  NA 134 133* 139  K 5.3* 4.6 4.0  CL 97 101 109  CO2 23 24 24   BUN 21 24* 17  CREATININE 1.77* 1.54* 1.06*  CALCIUM 9.3 9.0 8.8*  PROT 6.8 7.0  --   BILITOT <0.2 0.5  --   ALKPHOS 135* 100  --   ALT 6 6*  --   AST 9 11*  --   GLUCOSE 585* 433* 233*    Imaging/Diagnostic  Tests: No results found.   Lovenia Kim, MD  PGY-2, [facility]

## 2017-09-11 DIAGNOSIS — N390 Urinary tract infection, site not specified: Secondary | ICD-10-CM | POA: Diagnosis not present

## 2017-09-11 DIAGNOSIS — R55 Syncope and collapse: Secondary | ICD-10-CM | POA: Diagnosis not present

## 2017-09-11 DIAGNOSIS — I951 Orthostatic hypotension: Secondary | ICD-10-CM | POA: Diagnosis not present

## 2017-09-11 LAB — CBC
HEMATOCRIT: 31 % — AB (ref 36.0–46.0)
HEMOGLOBIN: 9.5 g/dL — AB (ref 12.0–15.0)
MCH: 26.2 pg (ref 26.0–34.0)
MCHC: 30.6 g/dL (ref 30.0–36.0)
MCV: 85.6 fL (ref 78.0–100.0)
Platelets: 272 10*3/uL (ref 150–400)
RBC: 3.62 MIL/uL — ABNORMAL LOW (ref 3.87–5.11)
RDW: 13 % (ref 11.5–15.5)
WBC: 11.9 10*3/uL — ABNORMAL HIGH (ref 4.0–10.5)

## 2017-09-11 LAB — BASIC METABOLIC PANEL
ANION GAP: 6 (ref 5–15)
BUN: 16 mg/dL (ref 6–20)
CALCIUM: 8.7 mg/dL — AB (ref 8.9–10.3)
CO2: 26 mmol/L (ref 22–32)
Chloride: 106 mmol/L (ref 101–111)
Creatinine, Ser: 1.15 mg/dL — ABNORMAL HIGH (ref 0.44–1.00)
GFR calc Af Amer: 60 mL/min — ABNORMAL LOW (ref >60)
GFR calc non Af Amer: 51 mL/min — ABNORMAL LOW (ref >60)
GLUCOSE: 185 mg/dL — AB (ref 65–99)
Potassium: 4.6 mmol/L (ref 3.5–5.1)
Sodium: 138 mmol/L (ref 135–145)

## 2017-09-11 LAB — URINE CULTURE

## 2017-09-11 LAB — GLUCOSE, CAPILLARY
GLUCOSE-CAPILLARY: 144 mg/dL — AB (ref 65–99)
Glucose-Capillary: 225 mg/dL — ABNORMAL HIGH (ref 65–99)
Glucose-Capillary: 174 mg/dL — ABNORMAL HIGH (ref 65–99)
Glucose-Capillary: 228 mg/dL — ABNORMAL HIGH (ref 65–99)

## 2017-09-11 MED ORDER — CEPHALEXIN 250 MG PO CAPS
250.0000 mg | ORAL_CAPSULE | Freq: Four times a day (QID) | ORAL | Status: DC
  Administered 2017-09-11 – 2017-09-12 (×5): 250 mg via ORAL
  Filled 2017-09-11 (×6): qty 1

## 2017-09-11 NOTE — Progress Notes (Signed)
Family Medicine Teaching Service Daily Progress Note Intern Pager: [phone]  Patient name: Andrea Glenn Medical record number: [id number] Date of birth: Aug 01, 1960 Age: 57 y.o. Gender: female  Primary Care Provider:  Bing, DO Consultants: None Code Status: FULL   Pt Overview and Major Events to Date:  Admit 5/24 CTX (5/24- 5/25) Keflex (5/26-  )  Assessment and Plan: Andrea Glenn is a 57 y.o. female presenting with syncopal episode. PMH is significant for T2DM, HTN and recent history of shingles.    Syncopal episode- negative workup, likely orthostatic 2/2 hypovolemia.  Blood pressures have been elevated to 563-149 systolics and her home Norvasc 10 mg was restarted 5/25. PT recommends HHPT/OT-no f/u.  HH F2F order placed.  She reports she has not been eating much and is perceived as dramatic on exam - exhibits pain out of proportion stating she hurts everywhere and wants the pain to stop.    -d/c tele this AM  -KVO IVF to encourage po intake  -vitals per floor  UTI- Leukocytosis improving 12.7>11.9 and has remained afebrile.  UA w/ LE, nitrites, pt symptomatic with urinary frequency denies burning, suprapubic pain, flank pain, fevers, chills.  -start Keflex 250 mg Q6 for total 7 days on abx -tylenol as needed for fever or pain  T2DM uncontrolled with hyperglycemia- on trulicity at home has not been taking this medication.  Elevated CBGs of 170-280.  -mod SSI  -CBGs AC/HS -discuss outpatient medication regimen  No insurance- patient without insurance which is limiting her medication options -c/s CSW   HTN- BP this AM 164/91.  At home on Norvasc 10 mg daily, initially held for syncopal episodes and severe orthostasis but now restarted.  -continue home Norvasc  -monitor BP  Elevated Creatinine-improving 1.54>1.15 which appears to be her baseline.  -MIVF as above -monitor on daily bmp  HLD- on problem list, no statin on, has T2DM.  Lipid panel on  admission with LDL 131, HDL 32, TG 260.    Post-herpetic neuralgia- recent history of shingles, without active lesions -continue capsaicin and lidocaine cream   FEN/GI: carb modified diet Prophylaxis: lovenox   Disposition: dispo pending clinical improvement   Subjective:  Endorses nausea but has not vomited.  Several episodes of emesis last night. She reports she is unable to eat much.  Denies headache but feels "terrible".  Objective: Temp:  [98.2 F (36.8 C)-99.1 F (37.3 C)] 98.2 F (36.8 C) (05/26 0804) Pulse Rate:  [76-89] 86 (05/26 0804) Resp:  [16-18] 16 (05/26 0415) BP: (142-186)/(75-91) 164/91 (05/26 0804) SpO2:  [99 %-100 %] 100 % (05/26 0804) Weight:  [119 lb 6.4 oz (54.2 kg)] 119 lb 6.4 oz (54.2 kg) (05/26 0415)   Physical Exam: General: 57 yo female, appears tired, very sensitive to light touch w/ pain out of proportion to exam  Eyes: has colored contacts, EOMI, no conjunctival injection  ENTM: MMM, o/p clear  Neck: supple, no LAD Cardiovascular: RRR, no MRG, 2+ pedal pulses  Respiratory: CTAB. Normal effort  Gastrointestinal: soft, NTND, +BS MSK: no edema Derm: scattered dry and crusted pustules over left flank, no drainage  Neuro: Alert, no focal deficits   Laboratory: Recent Labs  Lab 09/09/17 1343 09/10/17 0423 09/11/17 0354  WBC 12.7* 11.2* 11.9*  HGB 10.0* 9.0* 9.5*  HCT 31.6* 29.0* 31.0*  PLT 289 265 272   Recent Labs  Lab 09/07/17 1711 09/09/17 1343 09/10/17 0423 09/11/17 0354  NA 134 133* 139 138  K 5.3* 4.6 4.0 4.6  CL 97 101 109 106  CO2 23 24 24 26   BUN 21 24* 17 16  CREATININE 1.77* 1.54* 1.06* 1.15*  CALCIUM 9.3 9.0 8.8* 8.7*  PROT 6.8 7.0  --   --   BILITOT <0.2 0.5  --   --   ALKPHOS 135* 100  --   --   ALT 6 6*  --   --   AST 9 11*  --   --   GLUCOSE 585* 433* 233* 185*    Imaging/Diagnostic Tests: No results found.   Lovenia Kim, MD  PGY-2, [facility]

## 2017-09-11 NOTE — Progress Notes (Signed)
Consult 5/26 Visited patient who shared she has shingles.  I had prayer with her for healing and for peace of mind and for those providing care.  She had a friend visiting at the time.  Conard Novak, Chaplain   09/11/17 1700  Clinical Encounter Type  Visited With Patient  Visit Type Initial;Other (Comment) (consult 5/26)  Referral From Physician  Consult/Referral To Chaplain  Spiritual Encounters  Spiritual Needs Prayer  Stress Factors  Patient Stress Factors Health changes  Family Stress Factors None identified

## 2017-09-12 DIAGNOSIS — I951 Orthostatic hypotension: Secondary | ICD-10-CM | POA: Diagnosis not present

## 2017-09-12 DIAGNOSIS — N390 Urinary tract infection, site not specified: Secondary | ICD-10-CM | POA: Diagnosis not present

## 2017-09-12 DIAGNOSIS — R55 Syncope and collapse: Secondary | ICD-10-CM | POA: Diagnosis not present

## 2017-09-12 LAB — CBC
HCT: 27.7 % — ABNORMAL LOW (ref 36.0–46.0)
Hemoglobin: 8.7 g/dL — ABNORMAL LOW (ref 12.0–15.0)
MCH: 26.6 pg (ref 26.0–34.0)
MCHC: 31.4 g/dL (ref 30.0–36.0)
MCV: 84.7 fL (ref 78.0–100.0)
PLATELETS: 242 10*3/uL (ref 150–400)
RBC: 3.27 MIL/uL — ABNORMAL LOW (ref 3.87–5.11)
RDW: 12.9 % (ref 11.5–15.5)
WBC: 11.6 10*3/uL — ABNORMAL HIGH (ref 4.0–10.5)

## 2017-09-12 LAB — BASIC METABOLIC PANEL
Anion gap: 7 (ref 5–15)
BUN: 19 mg/dL (ref 6–20)
CHLORIDE: 104 mmol/L (ref 101–111)
CO2: 29 mmol/L (ref 22–32)
CREATININE: 1.17 mg/dL — AB (ref 0.44–1.00)
Calcium: 9 mg/dL (ref 8.9–10.3)
GFR calc Af Amer: 58 mL/min — ABNORMAL LOW (ref >60)
GFR calc non Af Amer: 50 mL/min — ABNORMAL LOW (ref >60)
GLUCOSE: 196 mg/dL — AB (ref 65–99)
Potassium: 4.8 mmol/L (ref 3.5–5.1)
Sodium: 140 mmol/L (ref 135–145)

## 2017-09-12 LAB — GLUCOSE, CAPILLARY
GLUCOSE-CAPILLARY: 113 mg/dL — AB (ref 65–99)
GLUCOSE-CAPILLARY: 99 mg/dL (ref 65–99)
Glucose-Capillary: 288 mg/dL — ABNORMAL HIGH (ref 65–99)

## 2017-09-12 LAB — HEMOGLOBIN A1C
Hgb A1c MFr Bld: 9.1 % — ABNORMAL HIGH (ref 4.8–5.6)
Mean Plasma Glucose: 214.47 mg/dL

## 2017-09-12 MED ORDER — CAPSAICIN 0.025 % EX CREA: TOPICAL_CREAM | Freq: Two times a day (BID) | CUTANEOUS | 0 refills | Status: DC | PRN

## 2017-09-12 MED ORDER — CEPHALEXIN 250 MG PO CAPS: 500.0000 mg | ORAL_CAPSULE | Freq: Four times a day (QID) | ORAL | 0 refills | Status: AC

## 2017-09-12 MED ORDER — POLYVINYL ALCOHOL 1.4 % OP SOLN: 2.0000 [drp] | Freq: Four times a day (QID) | OPHTHALMIC | 0 refills | Status: DC

## 2017-09-12 NOTE — Progress Notes (Signed)
Occupational Therapy Treatment Patient Details Name: Andrea Glenn MRN: [id number] DOB: 1961/02/21 Today's Date: 09/12/2017    History of present illness Pt admitted after syncopal episode at home on her front porch. PMH: DM, smoker, hypotension, possible hypothyroidism, tremors since 2/19. MRI neg and ECHO normal. Orthostatics positive.    OT comments  Limited activity tolerance due to nausea and dry heaves. Did not vomit. Pt able to mobilize with supervision. RN aware of pt's nausea.  Follow Up Recommendations  No OT follow up;Supervision/Assistance - 24 hour    Equipment Recommendations  (Rollator)    Recommendations for Other Services      Precautions / Restrictions Precautions Precautions: Fall       Mobility Bed Mobility Overal bed mobility: Modified Independent             General bed mobility comments: HOB up  Transfers Overall transfer level: Needs assistance Equipment used: Rolling walker (2 wheeled) Transfers: Sit to/from [facility] Sit to Stand: Min guard Stand pivot transfers: Min guard       General transfer comment: for safety    Balance Overall balance assessment: Needs assistance   Sitting balance-Leahy Scale: Good       Standing balance-Leahy Scale: Fair Standing balance comment: used walker to stabilize as she transferred, but able to stand statically without support                           ADL either performed or assessed with clinical judgement   ADL       Grooming: Wash/dry hands;Sitting;Set up                   Toilet Transfer: Supervision/safety;Stand-pivot;RW;BSC             General ADL Comments: Pt with nausea limiting tolerance of ADL.     Vision       Perception     Praxis      Cognition Arousal/Alertness: Awake/alert Behavior During Therapy: WFL for tasks assessed/performed Overall Cognitive Status: Within Functional Limits for tasks assessed                                           Exercises     Shoulder Instructions       General Comments      Pertinent Vitals/ Pain       Pain Assessment: Faces Faces Pain Scale: Hurts little more Pain Location: back reports shingles Pain Descriptors / Indicators: Constant;Discomfort;Grimacing Pain Intervention(s): Monitored during session;Repositioned  Home Living                                          Prior Functioning/Environment              Frequency  Min 2X/week        Progress Toward Goals  OT Goals(current goals can now be found in the care plan section)  Progress towards OT goals: Progressing toward goals  Acute Rehab OT Goals Patient Stated Goal: stop nausea OT Goal Formulation: With patient Time For Goal Achievement: 09/24/17 Potential to Achieve Goals: Good  Plan Discharge plan remains appropriate    Co-evaluation  AM-PAC PT "6 Clicks" Daily Activity     Outcome Measure   Help from another person eating meals?: None Help from another person taking care of personal grooming?: A Little Help from another person toileting, which includes using toliet, bedpan, or urinal?: A Little Help from another person bathing (including washing, rinsing, drying)?: A Little Help from another person to put on and taking off regular upper body clothing?: None Help from another person to put on and taking off regular lower body clothing?: A Little 6 Click Score: 20    End of Session Equipment Utilized During Treatment: Rolling walker  OT Visit Diagnosis: Unsteadiness on feet (R26.81);Other abnormalities of gait and mobility (R26.89);Muscle weakness (generalized) (M62.81);Other symptoms and signs involving cognitive function;Pain   Activity Tolerance Treatment limited secondary to medical complications (Comment)(nausea, dry heaves)   Patient Left in chair;with call bell/phone within reach;with nursing/sitter in room   Nurse  Communication (pt needing nausea meds)        Time: 4650-3546 OT Time Calculation (min): 22 min  Charges: OT General Charges $OT Visit: 1 Visit OT Treatments $Self Care/Home Management : 8-22 mins  09/12/2017 Nestor Lewandowsky, OTR/L Pager: [phone]   [name] 09/12/2017, 1:03 PM

## 2017-09-12 NOTE — Care Management Note (Signed)
Case Management Note  Patient Details  Name: Andrea Glenn MRN: [id number] Date of Birth: 1960/08/24  Subjective/Objective:                    Action/Plan: Pt discharging home with orders for [facility] services. Pt does not have insurance so [facility] consulted for charity HH. Dan with [facility] to follow.  Pt discharging on eye gtts and Keflex. Pt uses [facility] and CM provided her a coupon for Keflex that brought cost to less than $8. Pt feels she can afford this medication.  Pt without insurance. CM will email financial counseling to make sure they follow up with patient.  Pt states her daughter will provide transport home.   Expected Discharge Date:  09/12/17               Expected Discharge Plan:  Home/Self Care  In-House Referral:     Discharge planning Services  CM Consult, Medication Assistance  Post Acute Care Choice:  Home Health Choice offered to:  Patient  DME Arranged:    DME Agency:     HH Arranged:  PT [place]:  [employer](charity services)  Status of Service:  Completed, signed off  If discussed at [place] of Stay Meetings, dates discussed:    Additional Comments:  Pollie Friar, RN 09/12/2017, 11:41 AM

## 2017-09-12 NOTE — Progress Notes (Signed)
Family Medicine Teaching Service Daily Progress Note Intern Pager: [phone]  Patient name: Andrea Glenn Medical record number: [id number] Date of birth: 1960/07/10 Age: 57 y.o. Gender: female  Primary Care Provider: [employer] Bing, DO Consultants: None Code Status: FULL   Pt Overview and Major Events to Date:  Admit 5/24 CTX (5/24- 5/25)  Keflex (5/26-  )   Assessment and Plan: Andrea Glenn is a 57 y.o. female presenting with syncopal episode. PMH is significant for T2DM, HTN and recent history of shingles.    Syncopal episode- negative workup, likely orthostatic 2/2 hypovolemia.  PT recommends HHPT/OT-no f/u and HH F2F order placed.  -plan for discharge home today  -vitals per floor  UTI- Leukocytosis improving 12.7>11.6 and has remained afebrile. Dysuria is improving. Transitioned to po abx 5/26. -continue Keflex 250 mg Q6 for total 7 days  -f/u ucx (obtained after starting abx) -tylenol prn   Dry eyes.  Pt wears contacts and has been sleeping with them in.  Recommended removal.  -continue lubricating eye drops -will d/c with this for home use   T2DM uncontrolled with hyperglycemia- on trulicity at home but has not been taking this medication.  Elevated CBGs of 170-280. Confirmed with patient that she does have Trulicity at home.  Encouraged medication compliance and outpatient regimen discussed.  -mod SSI  -CBGs AC/HS  No insurance- seen by CM. She is a [facility] patient, in process of getting her orange card but otherwise currently paying out of pocket.  Per note, [facility] does assist with obtaining her Trulicity.  -c/s CSW   HTN- BP this AM 174/77. Asymptomatic.  At home on Norvasc 10 mg daily, initially held for syncopal episodes and severe orthostasis but now restarted.  -continue home Norvasc    Elevated Creatinine-resolved. SCr this AM 1.17 which appears to be her baseline.  -daily bmp  HLD- on problem list, no statin on, has T2DM.  Lipid panel on admission  with LDL 131, HDL 32, TG 260.    Post-herpetic neuralgia- recent history of shingles, without active lesions -continue capsaicin and lidocaine cream   FEN/GI: carb modified diet Prophylaxis: lovenox   Disposition: plan for discharge home today   Subjective:  Patient endorses blurry vision.  No headache or dizziness reported.  She is agreeable to discharge home today with follow up in clinic.   Objective: Temp:  [98.4 F (36.9 C)-98.9 F (37.2 C)] 98.4 F (36.9 C) (05/27 0746) Pulse Rate:  [71-79] 79 (05/27 0746) BP: (125-174)/(69-79) 174/77 (05/27 0746) SpO2:  [95 %-100 %] 95 % (05/27 0746) Weight:  [121 lb 3.2 oz (55 kg)] 121 lb 3.2 oz (55 kg) (05/27 0300)   Physical Exam: General: 57 yo female, sitting up in bed, NAD  Eyes: EOMI, no conjunctival injection, has colored contacts  ENTM: MMM, o/p clear  Neck: supple, no LAD Cardiovascular: RRR, no MRG  Respiratory: CTAB. Normal effort on RA.  Gastrointestinal: soft, NTND, +BS MSK: no edema Derm: scattered dry and crusted pustules over left flank, no drainage  Neuro: Alert, no focal deficits   Laboratory: Recent Labs  Lab 09/10/17 0423 09/11/17 0354 09/12/17 0337  WBC 11.2* 11.9* 11.6*  HGB 9.0* 9.5* 8.7*  HCT 29.0* 31.0* 27.7*  PLT 265 272 242   Recent Labs  Lab 09/07/17 1711  09/09/17 1343 09/10/17 0423 09/11/17 0354 09/12/17 0337  NA 134   < > 133* 139 138 140  K 5.3*  --  4.6 4.0 4.6 4.8  CL 97  --  101 109 106 104  CO2 23  --  24 24 26 29   BUN 21   < > 24* 17 16 19   CREATININE 1.77*  --  1.54* 1.06* 1.15* 1.17*  CALCIUM 9.3  --  9.0 8.8* 8.7* 9.0  PROT 6.8  --  7.0  --   --   --   BILITOT <0.2  --  0.5  --   --   --   ALKPHOS 135*  --  100  --   --   --   ALT 6  --  6*  --   --   --   AST 9  --  11*  --   --   --   GLUCOSE 585*   < > 433* 233* 185* 196*   < > = values in this interval not displayed.    Imaging/Diagnostic Tests: No results found.   Lovenia Kim, MD  PGY-2, [place] [facility]

## 2017-09-13 ENCOUNTER — Telehealth: Payer: Self-pay

## 2017-09-13 NOTE — Telephone Encounter (Signed)
Amber, PT with [facility] requests following verbal orders:  PT 2x/week x 3 weeks, then 1x/week x 2 weeks; Skilled nursing eval for DM management  HH Aide to assist with bathing  Callback is [phone]  Danley Danker, RN [facility] [place])

## 2017-09-14 ENCOUNTER — Ambulatory Visit: Payer: Self-pay | Admitting: Family Medicine

## 2017-09-14 ENCOUNTER — Ambulatory Visit: Payer: Medicaid Other | Admitting: Physical Therapy

## 2017-09-14 NOTE — Discharge Summary (Signed)
[facility] Discharge Summary  Patient name: Andrea Glenn Medical record number: [id number] Date of birth: 12/22/1960 Age: 57 y.o. Gender: female Date of Admission: 09/09/2017  Date of Discharge: 09/12/17 Admitting Physician: Kinnie Feil, MD  Primary Care Provider: [place] Bing, DO Consultants: None  Indication for Hospitalization: syncopal episode   Discharge Diagnoses/Problem List:  Syncope T2DM Orthostatic hypotension HTN UTI HLD Postherpetic neuralgia  Disposition: Home with HHPT  Discharge Condition: Stable, improved   Discharge Exam:  General:57 yo female, sitting up in bed, NAD  Eyes: EOMI, no conjunctival injection, has colored contacts  ENTM:MMM, o/p clear  Neck:supple, no LAD Cardiovascular:RRR, no MRG  Respiratory:CTAB. Normal effort on RA.  Gastrointestinal:soft, NTND, +BS MSK:no edema Derm:scattered dry and crusted pustules over left flank, no drainage  Neuro:Alert, no focal deficits   Brief Hospital Course:  Patient presenting to ED with syncopal episode.  Recent admission for similar presentation revealed negative workup including head CT, EEG, carotid doppler and Echo all without cause for syncope.  Positive finding of orthostatic hypotension which is most likely the underlying etiology.  Home BP med held on admission and patient was monitored on telemetry unit.  Suspect syncope 2/2 orthostasis plus UTI/dehydration from elevated CBGs.  Pt had not been adherent to her home diabetic regimen and this was discussed with her prior to discharge.  She received Ceftriaxone for her UTI and was transitioned to po Keflex.   Home Norvasc was restarted due to elevated blood pressures closer to discharge and will follow up closely as outpatient.  Additionally, on admission with SCr elevated to 1.54 from baseline 1.1.  Improved with fluids and normalized by time of discharge.  Of note, pt with hx of shingles (no active lesions),  now with postherpetic neuralgia.  Capcaisin and topical lidocaine cream was continued for this.  She complained of dry eyes on day of discharge which is most likely 2/2 use of colored contact lenses and sleeping with them in. Patient advised to remove them and use lubricating eye drops.    Issues for Follow Up:  1. F/u hydration, pt advised to drink plenty of fluids as her syncopal episode likely related to hypotension and dehydration.   2. F/u BP - hypertensive to 160-170 sbp range prior to day of discharge and home Norvasc restarted.  3. Elevated CBGs- Pt has Trulicity at home but had not been taking this.  Diabetic regimen discussed prior to discharge and medication compliance encouraged.  Ensure she is taking as prescribed.  4. UTI - discharged on 7 days total of Keflex QID.  Ensure she has been taking this as directed.   Significant Procedures: none  Significant Labs and Imaging:  Recent Labs  Lab 09/10/17 0423 09/11/17 0354 09/12/17 0337  WBC 11.2* 11.9* 11.6*  HGB 9.0* 9.5* 8.7*  HCT 29.0* 31.0* 27.7*  PLT 265 272 242   Recent Labs  Lab 09/07/17 1711 09/09/17 1343 09/10/17 0423 09/11/17 0354 09/12/17 0337  NA 134 133* 139 138 140  K 5.3* 4.6 4.0 4.6 4.8  CL 97 101 109 106 104  CO2 23 24 24 26 29   GLUCOSE 585* 433* 233* 185* 196*  BUN 21 24* 17 16 19   CREATININE 1.77* 1.54* 1.06* 1.15* 1.17*  CALCIUM 9.3 9.0 8.8* 8.7* 9.0  ALKPHOS 135* 100  --   --   --   AST 9 11*  --   --   --   ALT 6 6*  --   --   --  ALBUMIN 2.7* 2.0*  --   --   --    Results/Tests Pending at Time of Discharge: none  Discharge Medications:  Allergies as of 09/12/2017      Reactions   Atorvastatin Other (See Comments)   Suicidal thoughts   Statins Other (See Comments)   This class of meds causes "suicidal thoughts"   Amitriptyline Other (See Comments)   Depression   Metformin And Related Diarrhea   Adhesive [tape] Other (See Comments)   "Sometimes peels off my skin"   Gabapentin Other  (See Comments)   "Gives me the shakes"      Medication List    TAKE these medications   acetaminophen 500 MG tablet Commonly known as:  TYLENOL Take 1,000 mg by mouth every 6 (six) hours as needed (for pain or headaches).   amLODipine 10 MG tablet Commonly known as:  NORVASC Take 1 tablet (10 mg total) by mouth daily.   bisacodyl 5 MG EC tablet Commonly known as:  DULCOLAX Take 1 tablet (5 mg total) by mouth daily as needed for moderate constipation.   capsaicin 0.025 % cream Commonly known as:  ZOSTRIX Apply topically 2 (two) times daily as needed (pain). What changed:    when to take this  reasons to take this   cephALEXin 250 MG capsule Commonly known as:  KEFLEX Take 2 capsules (500 mg total) by mouth 4 (four) times daily for 6 days.   Dulaglutide 0.75 MG/0.5ML Sopn Commonly known as:  TRULICITY Inject 5.17 mg into the skin once a week. What changed:  when to take this   iron polysaccharides 150 MG capsule Commonly known as:  NIFEREX Take 1 capsule (150 mg total) by mouth daily.   lidocaine 5 % ointment Commonly known as:  XYLOCAINE Apply 1 application topically as needed.   LUBRICATING EYE DROPS OP Place 1-2 drops into both eyes 4 (four) times daily as needed (for irritation).   nicotine 7 mg/24hr patch Commonly known as:  NICODERM CQ - dosed in mg/24 hr Place 1 patch (7 mg total) onto the skin daily.   polyvinyl alcohol 1.4 % ophthalmic solution Commonly known as:  LIQUIFILM TEARS Place 2 drops into both eyes 4 (four) times daily.       Discharge Instructions: Please refer to Patient Instructions section of EMR for full details.  Patient was counseled important signs and symptoms that should prompt return to medical care, changes in medications, dietary instructions, activity restrictions, and follow up appointments.   Follow-Up Appointments: Follow-up Information    Nuala Alpha, DO. Go on 09/14/2017.   Specialty:  Family Medicine Why:   Appointment at 2:15 PM.  Please arrive 15 min early.  Contact information: [address] [address] [phone]          Lovenia Kim, MD  PGY-2, [place]

## 2017-09-14 NOTE — Telephone Encounter (Signed)
Verbal orders placed.  Andrea Glenn, Andrea Glenn, PGY-2

## 2017-09-19 ENCOUNTER — Ambulatory Visit: Payer: Self-pay | Admitting: Internal Medicine

## 2017-09-19 ENCOUNTER — Telehealth: Payer: Self-pay

## 2017-09-19 NOTE — Telephone Encounter (Signed)
Faxed order # [id number] received from [facility] for skilled nursing visits. Orders placed in PCP's box for signature. Please return to nurse clinic when complete.  Danley Danker, RN [facility] RN)

## 2017-09-20 NOTE — Telephone Encounter (Signed)
Completed and placed in RN box.  Andrea Glenn, [place], PGY-2

## 2017-09-21 NOTE — Telephone Encounter (Signed)
Order # [id number] faxed to [facility] at [phone].  Danley Danker, RN [facility] RN)

## 2017-09-26 ENCOUNTER — Ambulatory Visit (INDEPENDENT_AMBULATORY_CARE_PROVIDER_SITE_OTHER): Payer: Self-pay | Admitting: Internal Medicine

## 2017-09-26 ENCOUNTER — Other Ambulatory Visit: Payer: Self-pay

## 2017-09-26 ENCOUNTER — Other Ambulatory Visit: Payer: Self-pay | Admitting: Internal Medicine

## 2017-09-26 ENCOUNTER — Encounter: Payer: Self-pay | Admitting: Psychology

## 2017-09-26 ENCOUNTER — Encounter: Payer: Self-pay | Admitting: Internal Medicine

## 2017-09-26 VITALS — BP 86/48 | HR 88 | Temp 97.9°F | Ht 64.0 in | Wt 114.0 lb

## 2017-09-26 DIAGNOSIS — E1142 Type 2 diabetes mellitus with diabetic polyneuropathy: Secondary | ICD-10-CM

## 2017-09-26 DIAGNOSIS — IMO0002 Reserved for concepts with insufficient information to code with codable children: Secondary | ICD-10-CM

## 2017-09-26 DIAGNOSIS — Z794 Long term (current) use of insulin: Secondary | ICD-10-CM

## 2017-09-26 DIAGNOSIS — E1165 Type 2 diabetes mellitus with hyperglycemia: Secondary | ICD-10-CM

## 2017-09-26 DIAGNOSIS — F32A Depression, unspecified: Secondary | ICD-10-CM

## 2017-09-26 DIAGNOSIS — F329 Major depressive disorder, single episode, unspecified: Secondary | ICD-10-CM

## 2017-09-26 DIAGNOSIS — F322 Major depressive disorder, single episode, severe without psychotic features: Secondary | ICD-10-CM

## 2017-09-26 DIAGNOSIS — I959 Hypotension, unspecified: Secondary | ICD-10-CM

## 2017-09-26 DIAGNOSIS — R634 Abnormal weight loss: Secondary | ICD-10-CM

## 2017-09-26 MED ORDER — CAPSAICIN 0.025 % EX CREA: TOPICAL_CREAM | Freq: Two times a day (BID) | CUTANEOUS | 0 refills | Status: DC | PRN

## 2017-09-26 MED ORDER — FLUOXETINE HCL 20 MG PO TABS
20.0000 mg | ORAL_TABLET | Freq: Every day | ORAL | 3 refills | Status: DC
Start: 2017-09-26 — End: 2017-10-07

## 2017-09-26 MED ORDER — DULAGLUTIDE 1.5 MG/0.5ML ~~LOC~~ SOAJ: 1.5000 mg | SUBCUTANEOUS | 3 refills | Status: DC

## 2017-09-26 NOTE — Assessment & Plan Note (Signed)
History of weight loss, though slightly improved from previous -Given patient is a smoker will check CT chest, abdomen, pelvis as stated in previous note to rule out mass  -Apparently has not heard from with endocrinology, will send a note to

## 2017-09-26 NOTE — Assessment & Plan Note (Signed)
Blood pressure low, recently admitted for syncope thought likely to be due to orthostatic hypotension from dehydration.  Apparently blood pressure was elevated in the hospital currently low - will stop Norvasc -Encourage patient to hydrate -Unwilling to get lab work today -Unable to recheck blood pressure given patient left

## 2017-09-26 NOTE — Progress Notes (Addendum)
Dr. Emmaline Life requested a [place].   Presenting Issue:  depression  Report of symptoms:  Can't get out of bed, extreme fatigue, tearfulness  Duration of CURRENT symptoms: approximately one year  Age of onset of first mood disturbance:  Unclear; patient has been treated for depression in the past, but has been feeling especially depressed over the last year.  Impact on function:  Unable to get out of bed; excessive sleeping  Psychiatric History - Diagnoses: depression - Hospitalizations: none per patient - Pharmacotherapy: Prozac and amitryptiline in the past; "neither helped me"  - Outpatient therapy: no  Family history of psychiatric issues:  Extensive: brother has schizophrenia; two nieces have Bipolar Disorder; sister had depression and attempted suicide  Current and history of substance use:  denied  Medical conditions that might explain or contribute to symptoms:  Diabetes, weight loss  PHQ-9:  Dr. Emmaline Life reported that [facility] was very high (c. 25) but flow sheet indicates otherwise, so this needs to be confirmed. Patient left before I could give her appropriate measures. GAD-7:  See above. MDQ (if indicated):  Not given today, but should be given on follow-up to rule out given family history.  Assessment / Plan / Recommendations: this patient is visibly distressed, tearful, and will not make eye contact. The patient reported that her mother recently died, she lost her job, and also lost her home to foreclosure. Her cousin is allowing her to stay with her temporarily. The patient agreed to meet with me next week at 11:00 am on June 17. The patient endorsed some suicidal ideation but no plan. When asked if she was likely to hurt herself today, she stated: "I don't know." The patient then abruptly asked to leave, stating that she was tired. She agreed to let me call her later and stated that she would not be alone at home. I again asked her if she was likely to hurt  herself today, this week or this month. She again replied that she could not tell me. Consultation was immediately obtained with Dr. Emmaline Life, who tried to talk the patient into discussing her mental state with [place] for a bit longer. The patient refused to talk and walked out. Dr. Emmaline Life and I then consulted Dr. Gwendlyn Deutscher for precepting. She advised [place] to contact 911 to request a welfare check on the patient, which I did. Emergency services was provided the address and phone number on file for the patient along with a physical description of her.  Warmhandoff completed.  Addendum: I called the patient to check on her; she stated that she was fine and wanted to go back to bed.

## 2017-09-26 NOTE — Assessment & Plan Note (Signed)
States that she has been compliant with her Trulicity with CBGs in 406 -300s Will increase trulicity to 1.5 mg  Follow up in two weeks

## 2017-09-26 NOTE — Patient Instructions (Addendum)
I am going to increase your Trulicity to 1.5 mg, please take this for your diabetes. I am going to start you on a medication for depression. Please follow up in two weeks.  We are going to due imaging of your chest and abdomen. You do need to get a colonoscopy in the near future as well.

## 2017-09-26 NOTE — Assessment & Plan Note (Addendum)
Severe depression noted today, new diagnosis.  Patient states that she had depression prior to the death of her mother and now it has worsened.  She spends all day laying in bed. -Concerned that this is the cause for her weight loss and her dehydration -PHQ 9 of 24 -Indicated suicidal ideation though not specifically today denied any specific plan  -Was reticent to discuss with behavioral medicine, however behavioral counselor was able to talk to patient-patient was unable to agree to not hurt herself and before any further intervention could take place she left the building.-Behavioral medicine to call the police department and have patient checked on -Started patient on Prozac

## 2017-09-26 NOTE — Progress Notes (Signed)
[name] Kerrin Mo, MD Phone: [phone]  Reason For Visit: Follow up   Currently discharged from the hospital following a syncope work-up.  Was treated for UTI with Keflex.  States she is completed course.  # T2DM  -Indicates that she has been taking her Trulicity every week.   - Has been having blood sugars in the 200-300s, previously   #CHRONIC HTN: Apprently had high blood pressures in the hospital, concern that low blood pressures were due to dehydration - as patient has a AKI on admission. Indicates she is drinking fluids  Current Meds - Norvasc, reports she has not been taking her medication during the past week.  Denies CP, dyspnea, HA,  Indicates having swelling in her feet   # Weight loss  -Patient's weight is slightly up from last visit.  She still feels like she lost weight.     #Depression: Symptoms: Symptoms have been ongoing since January of this year.  Patient states  she is very depressed.  She spends all her time laying in bed she has no motivation.  She does not eat and drink as much because she does not have much appetite. Psychiatric History - Diagnoses: No history  Family history of psychiatric issues: Not discussed  Current and history of substance use: Denies any alcohol use, cocaine, other drug use. Medical conditions that might explain or contribute to symptoms: Notable for possible subacute hyperthyroidism which could be contributing Indicates that she has thought of killing herself in the past, she does not have a specific plan, she denies wanting to do so at the moment  PHQ-9: 24   Past Medical History Reviewed problem list.  Medications- reviewed and updated No additions to family history Social history- patient is a smoker  Objective: BP (!) 86/48   Pulse 88   Temp 97.9 F (36.6 C) (Oral)   Ht 5\' 4"  (1.626 m)   Wt 114 lb (51.7 kg)   SpO2 99%   BMI 19.57 kg/m  Gen: NAD, alert, cooperative with  exam Cardio: regular rate and rhythm, S1S2 heard, no murmurs appreciated Pulm: clear to auscultation bilaterally, no wheezes, rhonchi or rales Skin: dry, intact, no rashes or lesions   Assessment/Plan: See problem based a/p  Uncontrolled type 2 diabetes mellitus with peripheral neuropathy (HCC) States that she has been compliant with her Trulicity with CBGs in 725 -300s Will increase trulicity to 1.5 mg  Follow up in two weeks   Hypotension Blood pressure low, recently admitted for syncope thought likely to be due to orthostatic hypotension from dehydration.  Apparently blood pressure was elevated in the hospital currently low - will stop Norvasc -Encourage patient to hydrate -Unwilling to get lab work today -Unable to recheck blood pressure given patient left  Depression Severe depression noted today, new diagnosis.  Patient states that she had depression prior to the death of her mother and now it has worsened.  She spends all day laying in bed. -Concerned that this is the cause for her weight loss and her dehydration -PHQ 9 of 24 -Indicated suicidal ideation though not specifically today denied any specific plan  -Was reticent to discuss with behavioral medicine, however behavioral counselor was able to talk to patient-patient was unable to agree to not hurt herself and before any further intervention could take place she left the building.-Behavioral medicine to call the police department and have patient checked on -Started patient on Prozac   Weight loss History of weight loss, though  slightly improved from previous -Given patient is a smoker will check CT chest, abdomen, pelvis as stated in previous note to rule out mass  -Apparently has not heard from with endocrinology, will send a note to

## 2017-09-26 NOTE — Assessment & Plan Note (Signed)
°  Assessment / Plan / Recommendations: this patient is visibly distressed, tearful, and will not make eye contact. The patient reported that her mother recently died, she lost her job, and also lost her home to foreclosure. Her cousin is allowing her to stay with her temporarily. The patient agreed to meet with me next week at 11:00 am on June 17. The patient endorsed some suicidal ideation but no plan. When asked if she was likely to hurt herself today, she stated: "I don't know." The patient then abruptly asked to leave, stating that she was tired. She agreed to let me call her later and stated that she would not be alone at home. I again asked her if she was likely to hurt herself today, this week or this month. She again replied that she could not tell me. Consultation was immediately obtained with Dr. Emmaline Life, who tried to talk the patient into discussing her mental state with [place] for a bit longer. The patient refused to talk and walked out. Dr. Emmaline Life and I then consulted Dr. Gwendlyn Deutscher for precepting. She advised [place] to contact 911 to request a welfare check on the patient, which I did. Emergency services was provided the address and phone number on file for the patient along with a physical description of her.

## 2017-09-26 NOTE — Assessment & Plan Note (Deleted)
States that she has been compliant with her Trulicity with CBGs in 567 -300s Will increase trulicity to 1.5 mg  Follow up in two weeks

## 2017-09-27 ENCOUNTER — Telehealth: Payer: Self-pay

## 2017-09-27 ENCOUNTER — Telehealth: Payer: Self-pay | Admitting: Family Medicine

## 2017-09-27 NOTE — Telephone Encounter (Signed)
Pt would like to talk to dr Emmaline Life. She was totally out of whack yesterday and probably said things she shouldn't have said.

## 2017-09-27 NOTE — Telephone Encounter (Signed)
Nikki, RN with [place], called to report patient BP today was 98/60 sitting and 80/60 standing, T 99.7. RN did encourage patient to go to ED but patient refused. Patient stated she was supposed to go to ED yesterday and did not.  Please call back with any orders: [phone]  Danley Danker, RN [facility] RN)

## 2017-09-28 NOTE — Telephone Encounter (Signed)
Called patient.  She states that she was a mess on Monday. She denies wanting to hurt herself at all.  She states she has a lot to live for including her grandchildren.  She is not sure why she acted like that.  She promised me that she was not suicidal and had no intentions of trying to hurt herself within the next week or month.  She is very worried about her health and is going to get her CTs done tomorrow.  She will follow-up next week as discussed.  She still has not gotten the Prozac or Trulicity and is waiting on the health department to give her a call for these prescriptions.

## 2017-09-30 ENCOUNTER — Ambulatory Visit (HOSPITAL_COMMUNITY)
Admission: RE | Admit: 2017-09-30 | Discharge: 2017-09-30 | Disposition: A | Discharge status: Home / Self Care | Payer: Medicaid Other | Source: Ambulatory Visit | Attending: Family Medicine | Admitting: Family Medicine

## 2017-09-30 DIAGNOSIS — R911 Solitary pulmonary nodule: Secondary | ICD-10-CM | POA: Insufficient documentation

## 2017-09-30 DIAGNOSIS — I700 Atherosclerosis of aorta: Secondary | ICD-10-CM | POA: Insufficient documentation

## 2017-09-30 DIAGNOSIS — J439 Emphysema, unspecified: Secondary | ICD-10-CM | POA: Diagnosis not present

## 2017-09-30 DIAGNOSIS — R634 Abnormal weight loss: Secondary | ICD-10-CM | POA: Diagnosis present

## 2017-10-03 ENCOUNTER — Encounter

## 2017-10-03 ENCOUNTER — Ambulatory Visit: Payer: Self-pay | Admitting: Diagnostic Neuroimaging

## 2017-10-04 ENCOUNTER — Telehealth: Payer: Self-pay | Admitting: *Deleted

## 2017-10-04 NOTE — Telephone Encounter (Signed)
LM on nurse line. Need verbal orders for Nursing 2x weekly for 2 weeks and then 1x weekly for 1 week.   They also wanted to let the provider know  1. pt is orthostatic and c/o of dizziness with standing 2. She has a large blister on the top of her foot that is fluid filled. Fle[place], [name], CMA

## 2017-10-05 ENCOUNTER — Telehealth: Payer: Self-pay | Admitting: *Deleted

## 2017-10-05 NOTE — Telephone Encounter (Signed)
PT with [facility] needs verbal orders for the following:  1x weekly for 1 week, and then  2x weekly for 3 weeks.  Please call Amber with verbal orders.  Spyridon Hornstein, [name], CMA

## 2017-10-05 NOTE — Telephone Encounter (Signed)
Verbal orders given to [facility] ** typo in previous message.  Lucila Maine, DO PGY-2, [facility] 10/05/2017 1:46 PM

## 2017-10-05 NOTE — Telephone Encounter (Signed)
Verbal orders given to [employer]. Patient has known orthostasis. She has instructions on how to manage this.  Lucila Maine, DO PGY-2, [facility] 10/05/2017 1:44 PM

## 2017-10-05 NOTE — Telephone Encounter (Signed)
Verbal orders given to [employer].  Lucila Maine, DO PGY-2, [facility] 10/05/2017 1:45 PM

## 2017-10-07 ENCOUNTER — Encounter: Payer: Self-pay | Admitting: Internal Medicine

## 2017-10-07 ENCOUNTER — Other Ambulatory Visit: Payer: Self-pay

## 2017-10-07 ENCOUNTER — Ambulatory Visit (INDEPENDENT_AMBULATORY_CARE_PROVIDER_SITE_OTHER): Payer: Self-pay | Admitting: Internal Medicine

## 2017-10-07 VITALS — BP 102/58 | HR 94 | Temp 97.6°F | Wt 113.0 lb

## 2017-10-07 DIAGNOSIS — B353 Tinea pedis: Secondary | ICD-10-CM

## 2017-10-07 DIAGNOSIS — F322 Major depressive disorder, single episode, severe without psychotic features: Secondary | ICD-10-CM

## 2017-10-07 DIAGNOSIS — R634 Abnormal weight loss: Secondary | ICD-10-CM

## 2017-10-07 MED ORDER — CLOTRIMAZOLE 1 % EX CREA: 1.0000 "application " | TOPICAL_CREAM | Freq: Two times a day (BID) | CUTANEOUS | 0 refills | Status: DC

## 2017-10-07 NOTE — Progress Notes (Deleted)
   [name] Kerrin Mo, MD Phone: [phone]  Reason For Visit:   # *** -   Past Medical History Reviewed problem list.  Medications- reviewed and updated No additions to family history Social history- patient is a *** smoker  Objective: BP (!) 102/58   Pulse 94   Temp 97.6 F (36.4 C) (Oral)   Wt 113 lb (51.3 kg)   SpO2 99%   BMI 19.40 kg/m  Gen: NAD, alert, cooperative with exam HEENT: Normal    Neck: No masses palpated. No lymphadenopathy    Ears: Tympanic membranes intact, normal light reflex, no erythema, no bulging    Eyes: PERRLA, EOMI    Nose: nasal turbinates moist    Throat: moist mucus membranes, no erythema Cardio: regular rate and rhythm, S1S2 heard, no murmurs appreciated Pulm: clear to auscultation bilaterally, no wheezes, rhonchi or rales GI: soft, non-tender, non-distended, bowel sounds present, no hepatomegaly, no splenomegaly GU: external vaginal tissue ***, cervix ***, *** punctate lesions on cervix appreciated, *** discharge from cervical os, *** bleeding, *** cervical motion tenderness, *** abdominal/ adnexal masses Extremities: warm, well perfused, No edema, cyanosis or clubbing;  MSK: Normal gait and station Skin: dry, intact, no rashes or lesions Neuro: Strength and sensation grossly intact   Assessment/Plan: See problem based a/p  No problem-specific Assessment & Plan notes found for this encounter.

## 2017-10-07 NOTE — Progress Notes (Signed)
   [name] Kerrin Mo, MD Phone: [phone]  Reason For Visit: Follow up   # Weight loss Patient has been following with myself and Dr. Dallas Schimke for her weight loss.  Recently CTs were done of her abdomen and chest.  She had a 3 mm pulmonary nodule and will likely need this followed up in 1 year given she is a smoker.  CT also recommended patient have a colonoscopy.   # Depression -Patient continues to struggle with significant depression.  She denies wanting to hurt herself today.  She states she has too much to live for.  She discusses with me her level of her grandson.  She did not take the Prozac for more than 1 day.  As she said it made her feel like she was floating on the ceiling.  She is not interested in any other medication.  She is interested in behavioral medicine and following up weekly  Tinea Pedis -Patient presenting with 2 weeks of bullae noted on her right foot.  She denies any trauma.  She denies any bullae anywhere else.  She is not able to use her shoes as the bullae hurt  Past Medical History Reviewed problem list.  Medications- reviewed and updated No additions to family history Social history- patient is a  smoker  Objective: BP (!) 102/58   Pulse 94   Temp 97.6 F (36.4 C) (Oral)   Wt 113 lb (51.3 kg)   SpO2 99%   BMI 19.40 kg/m  Gen: NAD, alert, cooperative with exam Cardio: regular rate and rhythm, S1S2 heard, no murmurs appreciated Pulm: clear to auscultation bilaterally, no wheezes, rhonchi or rales GI: soft, non-tender, non-distended, bowel sounds present, no hepatomegaly, no splenomegaly Skin: Signs of tinea noted on toes and toenails, 2 cm bullae noted near forefoot   Assessment/Plan: See problem based a/p  Depression Severe depression still present.  Patient denies any suicidal ideation or desire to hurt herself.  She states she has too much to live for. Patient took Prozac for 1 day-states that she is not  interested in medications due to possible side effects Is interested in continuing counseling with behavioral medicine Recommend patient follow-up in 1 week with PCP to continue discussing her depression and possibly set up with behavioral medicine  Weight loss Obtain CT abdomen pelvis and chest negative for signs of acute causes of weight loss. -CT does recommend patient have a colonoscopy, patient is aware -Has a follow-up with endocrinology for possible subacute hyperthyroidism -Recommend patient gets a GI  Referral  as it is possible for her colonoscopy-she wanted to wait on this  Tinea pedis of right foot Consistent with tinea  vesicles per discussion with Dr. Erin Hearing Will treat with Lotrimin cream for 1 month If no improvement consider griseofulvin

## 2017-10-07 NOTE — Patient Instructions (Addendum)
Weight loss could possibly be due to your thyriod, however I am also concerned it could be due to your mood. Make follow up next week to see how your mood is doing   For the foot fungus - topical medication twice daily for the next month.   [facility] Address: [address], [place], [place] [address]  Hours:  Open ? Closes 5PM Phone: [phone]

## 2017-10-10 ENCOUNTER — Encounter: Payer: Self-pay | Admitting: Internal Medicine

## 2017-10-10 DIAGNOSIS — R911 Solitary pulmonary nodule: Secondary | ICD-10-CM

## 2017-10-10 DIAGNOSIS — B353 Tinea pedis: Secondary | ICD-10-CM | POA: Insufficient documentation

## 2017-10-10 HISTORY — DX: Solitary pulmonary nodule: R91.1

## 2017-10-10 NOTE — Assessment & Plan Note (Signed)
Obtain CT abdomen pelvis and chest negative for signs of acute causes of weight loss. -CT does recommend patient have a colonoscopy, patient is aware -Has a follow-up with endocrinology for possible subacute hyperthyroidism -Recommend patient gets a GI  Referral  as it is possible for her colonoscopy-she wanted to wait on this

## 2017-10-10 NOTE — Assessment & Plan Note (Signed)
Consistent with tinea  vesicles per discussion with Dr. Erin Hearing Will treat with Lotrimin cream for 1 month If no improvement consider griseofulvin

## 2017-10-10 NOTE — Assessment & Plan Note (Signed)
Severe depression still present.  Patient denies any suicidal ideation or desire to hurt herself.  She states she has too much to live for. Patient took Prozac for 1 day-states that she is not interested in medications due to possible side effects Is interested in continuing counseling with behavioral medicine Recommend patient follow-up in 1 week with PCP to continue discussing her depression and possibly set up with behavioral medicine

## 2017-10-14 ENCOUNTER — Encounter

## 2017-10-14 ENCOUNTER — Ambulatory Visit: Payer: Self-pay | Admitting: Diagnostic Neuroimaging

## 2017-10-18 ENCOUNTER — Ambulatory Visit (INDEPENDENT_AMBULATORY_CARE_PROVIDER_SITE_OTHER): Payer: Medicaid Other | Admitting: Family Medicine

## 2017-10-18 ENCOUNTER — Encounter: Payer: Self-pay | Admitting: Family Medicine

## 2017-10-18 VITALS — BP 105/60 | HR 85 | Temp 97.9°F | Ht 64.0 in | Wt 112.6 lb

## 2017-10-18 DIAGNOSIS — E1165 Type 2 diabetes mellitus with hyperglycemia: Secondary | ICD-10-CM | POA: Diagnosis not present

## 2017-10-18 DIAGNOSIS — F322 Major depressive disorder, single episode, severe without psychotic features: Secondary | ICD-10-CM

## 2017-10-18 DIAGNOSIS — E1142 Type 2 diabetes mellitus with diabetic polyneuropathy: Secondary | ICD-10-CM | POA: Diagnosis present

## 2017-10-18 DIAGNOSIS — B0229 Other postherpetic nervous system involvement: Secondary | ICD-10-CM | POA: Diagnosis not present

## 2017-10-18 DIAGNOSIS — IMO0002 Reserved for concepts with insufficient information to code with codable children: Secondary | ICD-10-CM

## 2017-10-18 LAB — GLUCOSE, POCT (MANUAL RESULT ENTRY): POC GLUCOSE: 348 mg/dL — AB (ref 70–99)

## 2017-10-18 NOTE — Progress Notes (Signed)
   Subjective   Patient ID: Andrea Glenn    DOB: December 10, 1960, 57 y.o. female   MRN: [id number]  CC: "Follow-up"  HPI: Andrea Glenn is a 57 y.o. female who presents to clinic today for the following:  Diabetes: Patient with long-standing uncontrolled diabetes without insulin use.  Trulicity was recently increased to 1.5 mg weekly the patient was not receiving this medication from the health department through the medication assistance program.  She has not been taking this medication for over 1 month.  She is worried if she takes her diabetic medication, her blood pressure will drop.  Polyneuropathy: Patient continues to have pain in her lower extremities bilaterally.  She has been adherent to keeping her legs raised but does not have compression stockings.  She does not take any medication for her neuropathy.  Depression: Patient continues to have symptoms of depression without thoughts of hurting self or others.  She was reluctant to follow-up with behavioral therapy appointment but did speak over the phone which she thought was somewhat helpful.  She is not interested in seeing behavioral therapy.  She feels that she is receiving help from her church.  ROS: see HPI for pertinent.  [place]: Poorly controlled T2DM, recent pancreatitis, tobacco use disorder, RLS, HLD.Surgical shoulder and foot surgery, c-sec, back surgery. FamilyhistoryCKD, DM, thyroid disease, HTN, HF, stroke,cancer(sister). Smoking status reviewed. Medications reviewed.  Objective   BP 105/60 (BP Location: Right Arm, Patient Position: Sitting, Cuff Size: Normal)   Pulse 85   Temp 97.9 F (36.6 C) (Oral)   Ht 5\' 4"  (1.626 m)   Wt 112 lb 9.6 oz (51.1 kg)   SpO2 99%   BMI 19.33 kg/m  Vitals and nursing note reviewed.  General: well nourished, well developed, NAD with non-toxic appearance HEENT: normocephalic, atraumatic, moist mucous membranes Cardiovascular: regular rate and rhythm without murmurs, rubs, or  gallops Lungs: clear to auscultation bilaterally with normal work of breathing Abdomen: soft, non-tender, non-distended, normoactive bowel sounds Skin: warm, dry, no rashes or lesions, cap refill < 2 seconds Extremities: warm and well perfused, normal tone, trace edema limited to feet up to ankles bilaterally, moderate tenderness to legs bilaterally with palpation Psych: dysthymic mood, flat affect  Assessment & Plan   Uncontrolled type 2 diabetes mellitus with peripheral neuropathy (HCC) Chronic.  Uncontrolled.  CBG 384 in office.  Not adherent to GLP-1 agonist due to inability to obtain medication and fear of hypotension. - Discussed at length with Dr. Valentina Lucks who is in agreement with patient and me regarding Trulicity use of 1 pen this week and 2 pens next week with instructions to follow-up in 2 weeks at which point we may consider giving patient sample - Will need to reach out to health department to determine why patient is unable to receive Trulicity  Depression Chronic.  Likely related to recent death of mother and multiple comorbidities.  No threat to self or others. - Advised patient to consider behavioral therapy medication but not interested at this time  Postherpetic neuralgia Chronic.  Likely related to significant improvement of significant control over the last several months though patient is known uncontrolled. - Advised patient to take Trulicity and follow-up with diabetic plan - May consider gabapentin or alternative pharmacotherapy at next visit  Orders Placed This Encounter  Procedures  . Glucose (CBG)   No orders of the defined types were placed in this encounter.   Harriet Butte, [place], PGY-2 10/18/2017, 5:38 PM

## 2017-10-18 NOTE — Patient Instructions (Addendum)
Thank you for coming in to see [place] today. Please see below to review our plan for today's visit.  Make sure you drink enough fluids each day.  Take 1 pain of your Trulicity this week and 2 pens next week.  I would like to see you in 2 weeks to reassess your diabetes.  We will work on reaching out to the health department to determine why you are not receiving Trulicity.  Please call the clinic at [phone] if your symptoms worsen or you have any concerns. It was our pleasure to serve you.  Harriet Butte, [place], PGY-2

## 2017-10-18 NOTE — Assessment & Plan Note (Signed)
Chronic.  Likely related to significant improvement of significant control over the last several months though patient is known uncontrolled. - Advised patient to take Trulicity and follow-up with diabetic plan - May consider gabapentin or alternative pharmacotherapy at next visit

## 2017-10-18 NOTE — Assessment & Plan Note (Signed)
Chronic.  Likely related to recent death of mother and multiple comorbidities.  No threat to self or others. - Advised patient to consider behavioral therapy medication but not interested at this time

## 2017-10-18 NOTE — Assessment & Plan Note (Signed)
Chronic.  Uncontrolled.  CBG 384 in office.  Not adherent to GLP-1 agonist due to inability to obtain medication and fear of hypotension. - Discussed at length with Dr. Valentina Lucks who is in agreement with patient and me regarding Trulicity use of 1 pen this week and 2 pens next week with instructions to follow-up in 2 weeks at which point we may consider giving patient sample - Will need to reach out to health department to determine why patient is unable to receive Trulicity

## 2017-10-24 ENCOUNTER — Telehealth: Payer: Self-pay

## 2017-10-24 NOTE — Telephone Encounter (Signed)
Princess, PT with [facility], called to report patient BP is low at 90/60, HR 89, R 17, and T 98.7. Patient did complain of some dizziness. Gave education regarding hydration and patient has taken in 16 oz of water. Please advise of anything else you would like done.  [place], RN [facility] RN)

## 2017-10-26 ENCOUNTER — Ambulatory Visit: Payer: Self-pay | Admitting: Endocrinology

## 2017-11-02 ENCOUNTER — Telehealth: Payer: Self-pay | Admitting: Pharmacist

## 2017-11-02 ENCOUNTER — Other Ambulatory Visit: Payer: Self-pay | Admitting: Family Medicine

## 2017-11-02 DIAGNOSIS — IMO0002 Reserved for concepts with insufficient information to code with codable children: Secondary | ICD-10-CM

## 2017-11-02 DIAGNOSIS — E1142 Type 2 diabetes mellitus with diabetic polyneuropathy: Secondary | ICD-10-CM

## 2017-11-02 DIAGNOSIS — E1165 Type 2 diabetes mellitus with hyperglycemia: Principal | ICD-10-CM

## 2017-11-02 MED ORDER — DULAGLUTIDE 1.5 MG/0.5ML ~~LOC~~ SOAJ: 1.5000 mg | SUBCUTANEOUS | 11 refills | Status: DC

## 2017-11-02 MED ORDER — DULAGLUTIDE 1.5 MG/0.5ML ~~LOC~~ SOAJ: 1.5000 mg | SUBCUTANEOUS | 11 refills | Status: AC

## 2017-11-02 NOTE — Telephone Encounter (Signed)
Dawn from [place] contact me and let me know that she can supply patient with lower dose of Trulicity 8.10 until the higher dose is available from [facility] through [place],.   Patient called and shared supply information.  She will plan to pick up and use Trulicity 0.75mg  (two shots at once if enough supply is provided by [facility]).    We discussed higher dose when it arrives in ~ 3 weeks.

## 2017-11-02 NOTE — Telephone Encounter (Signed)
Phone Call returned and patient reports continued swelling in feet and continued tremor.  Patient is home bound and working through [employer]. Patient in need of higher dose of Trulicity.   Took last dose(s) of Trulicity yesterday (took 2X 0.75).   Patient states she does NOT have any additional supply of Trulicity.  When she asked the Health Department she stated they could not provide supply at this time.   I offered to resend new Rx for 1.5mg  dose and then try to follow-up with them RE the paperwork and availability.     Told patient I would call her back when I heard something from the Health Department.  She is aware she may be out of medication next Tuesday.     [facility] follow up with me in 8 days.

## 2017-11-02 NOTE — Telephone Encounter (Signed)
She will be out of her insulin by next Tuesday and was told to let us know when she gets low.  She says health department cannot fill it at her new dosage.  Please call pt to discuss [phone])

## 2017-11-10 ENCOUNTER — Encounter: Payer: Self-pay | Admitting: Pharmacist

## 2017-11-10 ENCOUNTER — Ambulatory Visit (INDEPENDENT_AMBULATORY_CARE_PROVIDER_SITE_OTHER): Payer: Medicaid Other | Admitting: Pharmacist

## 2017-11-10 DIAGNOSIS — E1142 Type 2 diabetes mellitus with diabetic polyneuropathy: Secondary | ICD-10-CM | POA: Diagnosis not present

## 2017-11-10 DIAGNOSIS — IMO0002 Reserved for concepts with insufficient information to code with codable children: Secondary | ICD-10-CM

## 2017-11-10 DIAGNOSIS — E1165 Type 2 diabetes mellitus with hyperglycemia: Secondary | ICD-10-CM | POA: Diagnosis not present

## 2017-11-10 MED ORDER — DULOXETINE HCL 30 MG PO CPEP: 30.0000 mg | ORAL_CAPSULE | Freq: Every day | ORAL | 2 refills | Status: DC

## 2017-11-10 NOTE — Patient Instructions (Addendum)
It was good to see you today!   We are sending in a prescription for a new medication, DULOXETINE (CYMBALTA), to [facility]. This is intended to help your nerve pain and your mood.     Schedule an appointment with Dr. Yisroel Ramming in 2 weeks.

## 2017-11-10 NOTE — Progress Notes (Signed)
   S:     Chief Complaint  Patient presents with  . Medication Management    Diabetes and Peripheral Neuropathy    Patient arrives in moderate spirits, seemingly frustrated with continued struggles with neuropathy and swelling. Presents for diabetes evaluation, education, and management. Patient having a difficult time getting Trulicity 1.5 mg dose [facility]. They were able to supply 0.75 mg dose that she is taking 2 of. She has 2 pens at home for next week. She describes a decreased appetite and depressed mood, noting that she often sleeps "throughout the day". She notes that she very rarely leaves her house as a result of significant neuropathy and edema. She also thinks that Trulicity may be contributing to her decreased appetite.   She endorses persistent neuropathy in her feet/legs as well as all four fingers bilaterally, not in her thumbs. Leg neuropathy is up her calves. She describes the pain as tingling, burning, numbing, and painful. She also endorses significant swelling up her shins. She notes that compression stockings cause too much pain to wear. She notes that she often has a tremor in her arms and legs that can lead to falls. She needs her walker or a cane most days, but is not typically using a wheelchair.  Insurance coverage/medication affordability: No coverage  Patient reports adherence with medications.  Current diabetes medications include: Trulicity 1.5 mg weekly (using two 0.75 mg pens)   O:  Physical Exam  Musculoskeletal: She exhibits edema (3+ bilaterally up to shins).  Vitals reviewed.    Review of Systems  Neurological: Positive for weakness.  All other systems reviewed and are negative.  Lab Results  Component Value Date   HGBA1C 9.1 (H) 09/12/2017   Vitals:   11/10/17 1031  BP: 112/78  Pulse: 90  SpO2: 98%    Lipid Panel     Component Value Date/Time   CHOL 215 (H) 09/10/2017 0423   TRIG 260 (H) 09/10/2017 0423   HDL 32 (L) 09/10/2017 0423   CHOLHDL 6.7 09/10/2017 0423   VLDL 52 (H) 09/10/2017 0423   LDLCALC 131 (H) 09/10/2017 0423    Home fasting SMBG: Lowest 68, highest 258; Averaging 150s-200s.   A/P: Diabetes: Diabetes longstanding currently near control based on patient reported SMBG results. Patient is able to verbalize appropriate hypoglycemia management plan. Patient is adherent with medication when she is able to receive from [facility]. Tight glucose control is not desired d/t significant burden of comorbidities.  -Decreased Trulicity to 8.89 mg weekly, in effort to restore some appetite. Patient will continue to request from MAP. -Extensively discussed pathophysiology of DM, recommended lifestyle interventions, dietary effects on glycemic control -Next A1C anticipated 11/2017.    Peripheral Neuropathy/Mood Depressed mood, frustration, and apathy to engage in enjoyable activities, likely as a result of impact of debilitating neuropathy. Though patient does not endorse improvement, she demonstrates less edema, tremor, and hyperesthesia than in previous appointments -Initiate duloxetine 30 mg daily to target mood and neuropathy improvement Offered counseling support in office today.  Patient states her pastor is coming to her house and she finds this adequate at this time.  Encouraged to reconsider additional counseling as desired in the future.   Written patient instructions provided.  Total time in face to face counseling 60 minutes.   Follow up PCP Clinic Visit in 2 weeks.   Patient seen with Danella Penton, PharmD Candidate and Catie Darnelle Maffucci, PharmD,  PGY2 Pharmacy Resident.

## 2017-11-10 NOTE — Assessment & Plan Note (Addendum)
Diabetes: Diabetes longstanding currently near control based on patient reported SMBG results. Patient is able to verbalize appropriate hypoglycemia management plan. Patient is adherent with medication when she is able to receive from [facility]. Tight glucose control is not desired d/t significant burden of comorbidities.  -Decreased Trulicity to 6.73 mg weekly, in effort to restore some appetite. Patient will continue to request from [facility]. -Extensively discussed pathophysiology of DM, recommended lifestyle interventions, dietary effects on glycemic control -Next A1C anticipated 11/2017.    Peripheral Neuropathy/Mood Depressed mood, frustration, and apathy to engage in enjoyable activities, likely as a result of impact of debilitating neuropathy. Though patient does not endorse improvement, she demonstrates less edema, tremor, and hyperesthesia than in previous appointments -Initiate duloxetine 30 mg daily to target mood and neuropathy improvement Offered counseling support in office today.  Patient states her pastor is coming to her house and she finds this adequate at this time.  Encouraged to reconsider additional counseling as desired in the future.

## 2017-12-05 NOTE — Progress Notes (Deleted)
   Subjective   Patient ID: Andrea Glenn    DOB: May 10, 1960, 57 y.o. female   MRN: [id number]  CC: "***"  HPI: Andrea Glenn is a 57 y.o. female who presents to clinic today for the following:  ***: ***  ***Last seen by me on 10/18/2017.  Followed up with [facility] on 7/25 for diabetes having difficulty obtaining Trulicity 1.5 mg dose [facility].  Given supply for 0.75 mg dose.  She had 2 pens at home for the next week.  Patient reported decreased appetite, depressed mood, sleeping in the daytime and rarely leaves her house due to her significant neuropathy and edema.  Patient believed Trulicity attributed to decreased appetite.  Was instructed to decrease Trulicity to 9.67 mg weekly to restore appetite.  Alden Benjamin continue MIP.  Will need A1c.  Initiated duloxetine 30 mg daily for depressed mood and neuropathy.  Follow-up.  ROS: see HPI for pertinent.  [place]: Poorly controlled T2DM, recent pancreatitis, tobacco use disorder, RLS, HLD.Surgical shoulder and foot surgery, c-sec, back surgery. FamilyhistoryCKD, DM, thyroid disease, HTN, HF, stroke,cancer(sister). Smoking status reviewed. Medications reviewed.  Objective   There were no vitals taken for this visit. Vitals and nursing note reviewed.  General: well nourished, well developed, NAD with non-toxic appearance HEENT: normocephalic, atraumatic, moist mucous membranes Neck: supple, non-tender without lymphadenopathy Cardiovascular: regular rate and rhythm without murmurs, rubs, or gallops Lungs: clear to auscultation bilaterally with normal work of breathing Abdomen: soft, non-tender, non-distended, normoactive bowel sounds Skin: warm, dry, no rashes or lesions, cap refill < 2 seconds Extremities: warm and well perfused, normal tone, no edema  Assessment & Plan   No problem-specific Assessment & Plan notes found for this encounter.  No orders of the defined types were placed in this encounter.  No orders of the defined types were  placed in this encounter.   Harriet Butte, [place], PGY-3 12/05/2017, 11:44 AM

## 2017-12-06 ENCOUNTER — Ambulatory Visit: Payer: Self-pay | Admitting: Endocrinology

## 2017-12-06 ENCOUNTER — Ambulatory Visit: Payer: Medicaid Other | Admitting: Family Medicine

## 2017-12-15 ENCOUNTER — Ambulatory Visit (INDEPENDENT_AMBULATORY_CARE_PROVIDER_SITE_OTHER): Payer: Medicaid Other | Admitting: Family Medicine

## 2017-12-15 ENCOUNTER — Other Ambulatory Visit: Payer: Self-pay

## 2017-12-15 VITALS — BP 160/80 | HR 91 | Temp 98.0°F | Wt 117.0 lb

## 2017-12-15 DIAGNOSIS — B86 Scabies: Secondary | ICD-10-CM | POA: Diagnosis present

## 2017-12-15 MED ORDER — DIPHENHYDRAMINE HCL 50 MG/ML IJ SOLN
50.0000 mg | Freq: Once | INTRAMUSCULAR | Status: AC
  Administered 2017-12-15: 50 mg via INTRAMUSCULAR

## 2017-12-15 MED ORDER — DIPHENHYDRAMINE HCL 50 MG/ML IJ SOLN: 25.0000 mg | Freq: Once | INTRAMUSCULAR | Status: DC

## 2017-12-15 MED ORDER — PERMETHRIN 5 % EX CREA: 1.0000 "application " | TOPICAL_CREAM | Freq: Once | CUTANEOUS | 0 refills | Status: AC

## 2017-12-15 MED ORDER — HYDROXYZINE HCL 25 MG PO TABS: 25.0000 mg | ORAL_TABLET | Freq: Three times a day (TID) | ORAL | 0 refills | Status: DC | PRN

## 2017-12-15 NOTE — Patient Instructions (Addendum)
It was great seeing you today! I am sorry that you have had so much problems with your itching. I believe it is due to scabies. These are very small insects that can bite and cause intense itching. We gave you a dose of benadryl 50mg  in clinic. I will also give you a script for 25mg  three times a day of atarax to help with the itching.   I will give you permethrin cream to treat the scabies. Apply this cream head to toe once, and the apply again in 7 days.

## 2017-12-15 NOTE — Assessment & Plan Note (Signed)
Given multiple exposures in the same household, time course, and appearance of rash will treat treated scabies infection.  Patient so intensely pruritic will give 50 mg IM Benadryl in clinic, will also give Atarax 25 mg 3 times daily for itching, will give permethrin. -Benadryl 50 mg IM -Permethrin ointment, apply once, and apply in 7 days -Atarax 25 mg 3 times daily as needed

## 2017-12-15 NOTE — Progress Notes (Signed)
   HPI 57 year old female who presents with 2 to 3-week history of progressive pruritus.  She states that this itching first started on her arms, it has since progressed to almost her entire body.  She is unable to stop itching even in clinic.  She states that her grandson whom she lives with first developed this itching.  He works in urgent care for [employer].  Apparently he is itching stopped, she is unsure if he gained any treatment.   Patient has tried some Benadryl which has not worked.  She has tried warm baths, tried various lotions, other conservative measures which have not worked.  Does has not noticed any bugs in her house, although she did later relentthat she may have seen them out of the corner of her eye.   CC: Pruritus   ROS:   Review of Systems See HPI for ROS.   CC, SH/smoking status, and VS noted  Objective: BP (!) 160/80   Pulse 91   Temp 98 F (36.7 C) (Oral)   Wt 117 lb (53.1 kg)   SpO2 99%   BMI 20.08 kg/m  Gen: NAD, alert, cooperative, and pleasant.  Middle aged aged African-American female, in mild distress due to pruritus CV: RRR, no murmur Resp: CTAB, no wheezes, non-labored Abd: SNTND, BS present, no guarding or organomegaly Neuro: Alert and oriented, Speech clear, No gross deficits Skin: Noticeable mild to moderate excoriations along length of arms and legs, some erythematous papules noted in finger and toes spaces.  Mild erythema from excoriations, no warmth indicative of burgeoning infection. Attempt was made to photo rash, unfortunately unable to upload epic   Assessment and plan:  Scabies Given multiple exposures in the same household, time course, and appearance of rash will treat treated scabies infection.  Patient so intensely pruritic will give 50 mg IM Benadryl in clinic, will also give Atarax 25 mg 3 times daily for itching, will give permethrin. -Benadryl 50 mg IM -Permethrin ointment, apply once, and apply in 7 days -Atarax 25 mg 3  times daily as needed   No orders of the defined types were placed in this encounter.   Meds ordered this encounter  Medications  . DISCONTD: diphenhydrAMINE (BENADRYL) injection 25 mg  . hydrOXYzine (ATARAX/VISTARIL) 25 MG tablet    Sig: Take 1 tablet (25 mg total) by mouth 3 (three) times daily as needed.    Dispense:  30 tablet    Refill:  0  . permethrin (ELIMITE) 5 % cream    Sig: Apply 1 application topically once for 1 dose. Apply application topically head to toe once. Then apply application head to toe 7 days late    Dispense:  60 g    Refill:  0  . diphenhydrAMINE (BENADRYL) injection 50 mg     Guadalupe Dawn MD PGY-2 Family Medicine Resident  12/15/2017 5:27 PM

## 2017-12-27 ENCOUNTER — Ambulatory Visit (INDEPENDENT_AMBULATORY_CARE_PROVIDER_SITE_OTHER): Payer: Medicaid Other | Admitting: Family Medicine

## 2017-12-27 ENCOUNTER — Other Ambulatory Visit: Payer: Self-pay

## 2017-12-27 ENCOUNTER — Encounter: Payer: Self-pay | Admitting: Family Medicine

## 2017-12-27 VITALS — BP 148/72 | HR 83 | Temp 98.2°F | Ht 64.0 in | Wt 118.6 lb

## 2017-12-27 DIAGNOSIS — E1165 Type 2 diabetes mellitus with hyperglycemia: Secondary | ICD-10-CM | POA: Diagnosis not present

## 2017-12-27 DIAGNOSIS — B86 Scabies: Secondary | ICD-10-CM

## 2017-12-27 DIAGNOSIS — E1142 Type 2 diabetes mellitus with diabetic polyneuropathy: Secondary | ICD-10-CM | POA: Diagnosis not present

## 2017-12-27 DIAGNOSIS — IMO0002 Reserved for concepts with insufficient information to code with codable children: Secondary | ICD-10-CM

## 2017-12-27 LAB — POCT GLYCOSYLATED HEMOGLOBIN (HGB A1C): HBA1C, POC (CONTROLLED DIABETIC RANGE): 7.7 % — AB (ref 0.0–7.0)

## 2017-12-27 MED ORDER — HYDROXYZINE HCL 25 MG PO TABS: 25.0000 mg | ORAL_TABLET | Freq: Three times a day (TID) | ORAL | 0 refills | Status: DC | PRN

## 2017-12-27 MED ORDER — PERMETHRIN 5 % EX CREA: 1.0000 "application " | TOPICAL_CREAM | Freq: Once | CUTANEOUS | 0 refills | Status: AC

## 2017-12-27 NOTE — Progress Notes (Signed)
   Subjective   Patient ID: Andrea Glenn    DOB: November 13, 1960, 58 y.o. female   MRN: [id number]  CC: " Itching"  HPI: Andrea Glenn is a 57 y.o. female who presents to clinic today for the following:  Scabies: Patient here today with continual diffuse pruritus which has not improved since she was seen back on 12/15/2017 and diagnosed with scabies.  She reports good adherence to permethrin cream daily for 7 days and reports taking her last dose today.  Pruritus remains severe and diffuse.  She says that she is changed her bed sheets and wash her close.  She lives at home with her son who does not have a rash.  Patient would "like to get out of here soon as I can send him back into bed."  She has been taking the Atarax but continues to scratch.  She has no pets at home.  ROS: see HPI for pertinent.  [place]: IDT2DM, recent pancreatitis, tobacco use disorder, RLS, HLD.Surgical shoulder and foot surgery, c-sec, back surgery. FamilyhistoryCKD, DM, thyroid disease, HTN, HF, stroke,cancer(sister). Smoking status reviewed. Medications reviewed.  Objective   BP (!) 148/72   Pulse 83   Temp 98.2 F (36.8 C) (Oral)   Ht 5\' 4"  (1.626 m)   Wt 118 lb 9.6 oz (53.8 kg)   SpO2 98%   BMI 20.36 kg/m  Vitals and nursing note reviewed.  General: well nourished, well developed, NAD with non-toxic appearance HEENT: normocephalic, atraumatic, moist mucous membranes, no scleral icterus Neck: supple, non-tender without lymphadenopathy Cardiovascular: regular rate and rhythm without murmurs, rubs, or gallops Lungs: clear to auscultation bilaterally with normal work of breathing Skin: warm, dry, cap refill < 2 seconds, there are numerous papular hyperpigmented papules on all surfaces of body with higher concentration on forearms and back along with umbilicus with diffuse excoriation and scabs at different stages of healing Extremities: warm and well perfused, normal tone, no edema  Assessment & Plan    Uncontrolled type 2 diabetes mellitus with peripheral neuropathy (HCC) Chronic.  Improved control. - Advised patient to continue current treatment and follow-up for diabetes management after improvement of rash  Scabies Acute.  No signs of improvement despite appropriate use of permethrin cream for 1 week.  Rash does seem consistent with scabies. - Plan for additional treatment with permethrin 5% cream daily for 7 days - RTC 1 week for recheck at which point we should consider oral treatment alternative sources  Orders Placed This Encounter  Procedures  . HgB A1c   Meds ordered this encounter  Medications  . hydrOXYzine (ATARAX/VISTARIL) 25 MG tablet    Sig: Take 1 tablet (25 mg total) by mouth 3 (three) times daily as needed.    Dispense:  30 tablet    Refill:  0  . permethrin (ELIMITE) 5 % cream    Sig: Apply 1 application topically once for 1 dose.    Dispense:  60 g    Refill:  0    Harriet Butte, [place], PGY-3 12/27/2017, 5:09 PM

## 2017-12-27 NOTE — Assessment & Plan Note (Signed)
Chronic.  Improved control. - Advised patient to continue current treatment and follow-up for diabetes management after improvement of rash

## 2017-12-27 NOTE — Patient Instructions (Addendum)
Thank you for coming in to see [place] today. Please see below to review our plan for today's visit.  Thoroughly massage cream from head to soles of feet; leave on for 8 to 14 hours before removing (shower or bath). Repeat this regimen daily for 7 days. We will see you again in 1 week.  Please call the clinic at [phone] if your symptoms worsen or you have any concerns. It was our pleasure to serve you.  Harriet Butte, [place], PGY-3

## 2017-12-27 NOTE — Assessment & Plan Note (Signed)
Acute.  No signs of improvement despite appropriate use of permethrin cream for 1 week.  Rash does seem consistent with scabies. - Plan for additional treatment with permethrin 5% cream daily for 7 days - RTC 1 week for recheck at which point we should consider oral treatment alternative sources

## 2018-01-03 ENCOUNTER — Ambulatory Visit (INDEPENDENT_AMBULATORY_CARE_PROVIDER_SITE_OTHER): Payer: Medicaid Other | Admitting: Family Medicine

## 2018-01-03 VITALS — BP 120/80 | HR 83 | Temp 98.4°F | Ht 64.0 in | Wt 116.4 lb

## 2018-01-03 DIAGNOSIS — L299 Pruritus, unspecified: Secondary | ICD-10-CM | POA: Diagnosis not present

## 2018-01-03 MED ORDER — IVERMECTIN 3 MG PO TABS: ORAL_TABLET | ORAL | 0 refills | Status: DC

## 2018-01-03 NOTE — Patient Instructions (Signed)
Thank you for coming in to see [place] today. Please see below to review our plan for today's visit.  We will try taking a stronger medication that you will take by mouth called ivermectin.  You should receive 7 tablets.  Take 3-1/2 tablets during her first dose and take an additional 3-1/2 tablets 1 week after taking her first dose.  I would like you to follow-up in 2 weeks at which point we can reassess.  I will call you if there is any abnormalities to your blood work, otherwise she should receive results in the mail.  Please call the clinic at [phone] if your symptoms worsen or you have any concerns. It was our pleasure to serve you.  Harriet Butte, [place], PGY-3

## 2018-01-03 NOTE — Progress Notes (Signed)
   Subjective   Patient ID: Andrea Glenn    DOB: 08/18/1960, 57 y.o. female   MRN: [id number]  CC: "I am still itching"  HPI: Andrea Glenn is a 57 y.o. female who presents to clinic today for the following:  Rash and pruritus: Patient here today to discuss distant diffuse rash and associated pruritus since early August.  This has been unchanged despite 2 weeks worth of permethrin cream.  Patient reports significant itching and she continually scratches which has primarily been on her back on the shoulders, abdomen, posterior calves, and neck.  She lives with her nephew and grandson.  She does report her grandson having some itching but no associated rash, however her nephew does not have a rash.  She does not share a bed with either of her family members.  She has no pets at the home.  When asked if she cleaned her bed sheets and closed, patient stated "I do not have dirty close."  ROS: see HPI for pertinent.  [place]: IDT2DM, recent pancreatitis, tobacco use disorder, RLS, HLD.Surgical shoulder and foot surgery, c-sec, back surgery. FamilyhistoryCKD, DM, thyroid disease, HTN, HF, stroke,cancer(sister). Smoking status reviewed. Medications reviewed.  Objective   BP 120/80 (BP Location: Right Arm, Patient Position: Sitting, Cuff Size: Normal)   Pulse 83   Temp 98.4 F (36.9 C) (Oral)   Ht 5' 4" (1.626 m)   Wt 116 lb 6.4 oz (52.8 kg)   SpO2 99%   BMI 19.98 kg/m  Vitals and nursing note reviewed.  General: hunched over in chair with arms crossed and continual scratching, well nourished, well developed, NAD with non-toxic appearance HEENT: normocephalic, atraumatic, moist mucous membranes, anicteric Neck: supple, non-tender without lymphadenopathy Cardiovascular: regular rate and rhythm without murmurs, rubs, or gallops Lungs: clear to auscultation bilaterally with normal work of breathing Abdomen: soft, non-tender, non-distended, normoactive bowel sounds Skin: warm, dry, cap  refill < 2 seconds, diffuse excoriations across body with associated scabs at different stages of healing Extremities: warm and well perfused, normal tone, no edema          Assessment & Plan   Pruritus Subacute.  Uncertain etiology.  Still suspect scabies despite failed repeat use of permethrin cream.  Differential also includes bedbugs, lice, fleas.  Patient does have anxious appearance and history of bipolar taking prurigo nodularis a possibility. - Given ivermectin 3.5 tablets followed by additional 3.5 tablets 1 week following - RTC 2 weeks - CMET, CBC with differential, and bilirubin levels ordered however patient was impatient and left office before receiving labs  Orders Placed This Encounter  Procedures  . CMP14+EGFR  . CBC with Differential/Platelet  . Bilirubin, fractionated(tot/dir/indir)   Meds ordered this encounter  Medications  . ivermectin (STROMECTOL) 3 MG TABS tablet    Sig: TAKE 3.5 TABLETS ONE TIME DAILY FOLLOWED BY AN ADDITIONAL 3.5 TABLETS ONE TIME DAILY 1 WEEK AFTER FIRST DOSE.    Dispense:  7 tablet    Refill:  0     , DO [facility], PGY-3 01/03/2018, 5:28 PM 

## 2018-01-03 NOTE — Assessment & Plan Note (Addendum)
Subacute.  Uncertain etiology.  Still suspect scabies despite failed repeat use of permethrin cream.  Differential also includes bedbugs, lice, fleas.  Patient does have anxious appearance and history of bipolar taking prurigo nodularis a possibility. - Given ivermectin 3.5 tablets followed by additional 3.5 tablets 1 week following - RTC 2 weeks - CMET, CBC with differential, and bilirubin levels ordered however patient was impatient and left office before receiving labs

## 2018-01-16 ENCOUNTER — Other Ambulatory Visit: Payer: Self-pay

## 2018-01-16 ENCOUNTER — Ambulatory Visit (INDEPENDENT_AMBULATORY_CARE_PROVIDER_SITE_OTHER): Payer: Medicaid Other | Admitting: Family Medicine

## 2018-01-16 VITALS — BP 118/62 | HR 90 | Temp 98.0°F | Wt 116.4 lb

## 2018-01-16 DIAGNOSIS — L299 Pruritus, unspecified: Secondary | ICD-10-CM | POA: Diagnosis present

## 2018-01-16 DIAGNOSIS — Z23 Encounter for immunization: Secondary | ICD-10-CM | POA: Diagnosis not present

## 2018-01-16 DIAGNOSIS — B86 Scabies: Secondary | ICD-10-CM

## 2018-01-16 MED ORDER — HYDROXYZINE HCL 25 MG PO TABS: 25.0000 mg | ORAL_TABLET | Freq: Three times a day (TID) | ORAL | 0 refills | Status: DC | PRN

## 2018-01-16 MED ORDER — DIPHENHYDRAMINE HCL 25 MG PO CAPS: 25.0000 mg | ORAL_CAPSULE | Freq: Four times a day (QID) | ORAL | 0 refills | Status: DC | PRN

## 2018-01-16 NOTE — Patient Instructions (Signed)
It was a pleasure to see you today! Thank you for choosing [facility] for your primary care. Andrea Glenn was seen for itching. Come back to the clinic if you have any routine needs, and go to the emergency room if you have any life threatening symptoms.   I'm sorry that we do not have any trulicity samples today, you can try calling next week to see if any have come in.   We also suggest having a pest control company search the house to see if it's possible bugs are causing your itching, in the mean time our social worker will see if there are county grants to get that done.   Please bring all your medications to every doctors visit   Sign up for My Chart to have easy access to your labs results, and communication with your Primary care physician.     Please check-out at the front desk before leaving the clinic.     Best,  Dr. Sherene Sires FAMILY MEDICINE RESIDENT - PGY2 01/16/2018 3:07 PM

## 2018-01-17 DIAGNOSIS — Z23 Encounter for immunization: Secondary | ICD-10-CM | POA: Insufficient documentation

## 2018-01-17 NOTE — Assessment & Plan Note (Signed)
Ordered flu shot

## 2018-01-17 NOTE — Progress Notes (Signed)
    Subjective:  Andrea Glenn is a 57 y.o. female who presents to the [facility] today with a chief complaint of still itching.   HPI: Treated multiple times in last 2wks here for scabies with only minimal improvement in condition.  Still denying infestation although now willing to investigate that possibility.  She would like more medicine for the symptoms of itching, she has multiple visible scratch marks (from dry skin, no breaks in skin or bleeding).  Objective:  Physical Exam: BP 118/62   Pulse 90   Temp 98 F (36.7 C) (Oral)   Wt 116 lb 6.4 oz (52.8 kg)   SpO2 97%   BMI 19.98 kg/m   Gen: NAD, resting comfortably CV: RRR with no murmurs appreciated Pulm: NWOB, CTAB with no crackles, wheezes, or rhonchi GI: Normal bowel sounds present. Soft, Nontender, Nondistended. MSK: no edema, cyanosis, or clubbing noted Skin: warm, dry.  Multiple visible lesions now with significant scratching evident on dry skin.  Seems consistent with scabies/bedbugs.  No bleeding, no infection Neuro: grossly normal, moves all extremities Psych: Normal affect and thought content  No results found for this or any previous visit (from the past 72 hour(s)).   Assessment/Plan:  Scabies Patient still with itching despite multiple treatments although she has consistently denied that this might be result of infestation at home  I spoke with [facility], who is contacting SW to see if there are any resources in the community that might do home inspections to help verify if our suspiscions are true.  Patient agreed to this and also thinks she might know a Presenter, broadcasting so she will also look into it and see how much it costs.  Today I am refilling symptomatic itchiness medication  Pruritus Patient still with itching despite multiple treatments although she has consistently denied that this might be result of infestation at home  I spoke with [facility], who is contacting SW to see if there are any resources in the community  that might do home inspections to help verify if our suspiscions are true.  Patient agreed to this and also thinks she might know a Presenter, broadcasting so she will also look into it and see how much it costs.  Today I am refilling symptomatic itchiness medication  Need for immunization against influenza Ordered flu shot   Sherene Sires, [place] - PGY2 01/17/2018 10:35 AM

## 2018-01-17 NOTE — Assessment & Plan Note (Signed)
Patient still with itching despite multiple treatments although she has consistently denied that this might be result of infestation at home  I spoke with [facility], who is contacting SW to see if there are any resources in the community that might do home inspections to help verify if our suspiscions are true.  Patient agreed to this and also thinks she might know a Presenter, broadcasting so she will also look into it and see how much it costs.  Today I am refilling symptomatic itchiness medication

## 2018-01-17 NOTE — Assessment & Plan Note (Signed)
Patient still with itching despite multiple treatments although she has consistently denied that this might be result of infestation at home  I spoke with [facility], who is contacting SW to see if there are any resources in the community that might do home inspections to help verify if our suspiscions are true.  Patient agreed to this and also thinks she might know a Presenter, broadcasting so she will also look into it and see how much it costs.  Today I am refilling symptomatic itchiness medication

## 2018-01-23 ENCOUNTER — Telehealth: Payer: Self-pay | Admitting: *Deleted

## 2018-01-23 NOTE — Telephone Encounter (Signed)
Pt walks in asking if we have samples of Trulicity. At this time we do not.    She states that her last dose of trulicity was Tuesday. She thinks that her CBGs is up because she is sweating. She is on her way to get a form from the IRS to help her get trulicity approved thru MAP.  She has an appt @ 2:45 (with IRS) and refuses to have an MD check her out or let us check her CBG (she is not driving, has a family member out in the car driving her today).  She wonders if we can give her Tyler Aas or samples of something else.  Advised I would need to run by Dr. Valentina Lucks or Dr. Yisroel Ramming first, so she tells me she will come back after she gets the paperwork.  Will forward to [place] and [place], [name], [place]

## 2018-01-23 NOTE — Telephone Encounter (Signed)
Pt returns.  She is feeling much better. Spoke with Dr. Yisroel Ramming.  He will consult with Dr. Valentina Lucks tomorrow and given pt a call.  Pt agreeable to plan.  Best callback is [phone]. [place], [name], CMA

## 2018-01-25 NOTE — Telephone Encounter (Signed)
Patient calling again about samples of Trulicity. She was supposed to have a dose yesterday but does not have yet from medication assistance program. We do not have samples of Trulicity. Will route to PCP to see if we can substitute. We have Novolog, Levemir, Lantus, Ozempic and [place].  Call back to patient is [phone]  Danley Danker, RN [facility])

## 2018-01-25 NOTE — Telephone Encounter (Signed)
Spoke with Andrea Glenn at [place] at [employer]	[place]. Patient IRS form is not there and patient needs to bring it by and they have her medication. Spoke with patient who says she did drop it by. Gave her [name]'s phone number to call and work out. [phone].  Danley Danker, RN [facility] RN)

## 2018-01-26 NOTE — Telephone Encounter (Signed)
Thank you for the update.  Harriet [place], [place], PGY-3

## 2018-03-02 ENCOUNTER — Ambulatory Visit (INDEPENDENT_AMBULATORY_CARE_PROVIDER_SITE_OTHER): Payer: Medicaid Other | Admitting: Psychology

## 2018-03-02 DIAGNOSIS — F323 Major depressive disorder, single episode, severe with psychotic features: Secondary | ICD-10-CM | POA: Diagnosis not present

## 2018-03-02 NOTE — Progress Notes (Signed)
Integrated Behavioral Health Initial Visit  MRN: [id number] Name: Andrea Glenn  Session Start time: 10:30  Session End time: 11:15 Total time: 45 minutes  Type of Service: Integrated Behavioral Health  SUBJECTIVE: Andrea Glenn is a 57 y.o. female referred by self (as best I can tell). She was seen by Larene Beach (prior [employer]) in 6/19 one time for depressive symtpoms.   Report of symptoms: Depressed mood, tearful, sleeps all the time, doesn't get out of bed, pain from DM neuropathy interferes with function as well. Unsteady on her feet. Tremulous.   ASSESSMENT: Mood: Depressed and Affect: Tearful and withdrawn.   Patient is currently experiencing symptoms of  Depression which are exacerbated by physical conditions..  Patient may benefit from and is in agreement to receive further assessment and brief therapeutic interventions to assist with managing Depressive symtpoms.   GOALS: Patient will: 1. Reduce symptoms of: depression 2. Increase knowledge and/or ability of: self-management skills  3. Demonstrate ability to: Improve medication compliance  PLAN: 1. Follow up with behavioral health clinician: 12/3 when she sees Dr. Ouida Sills 2. Patient agrees to take Prozac daily until seen again to see if that makes a difference in her mood.  _______________________________________________________  Duration of CURRENT symptoms:February of this past year.  Impact on function: Function seems to be severely impaired by a combination of physical pain and mental health issues.  Psychiatric History - Diagnoses: Grief and depression listed on the problem list.  - Hospitalizations:  Denies hospitalization for mental health reasons but reports six hospitalizations for other reasons.   - Pharmacotherapy: Cymbalta is on her medication list. She denies taking it or knowing anything about it. She reports taking Prozac (prescribed 10/07/17) "when I'm about ready to spazz out."   She does not take this medication daily. - Outpatient therapy: Did not assess.  Family history of psychiatric issues: Extensive. Reports a brother with schizophrenia, two nieces and a grandson with Bipolar disorder. Lives with a 75 year old grandson who she reports has "mild autism."  Current and history of substance use: Denies use of all substances  Risk of harm to self or others: Reports history of SI but denies today. States she has a lot of "prayer behind her" and that is why she is not suicidal.   INTERVENTION:   Patient was anxious to leave at session end. Additionally, eye sight is very poor per her report. Self-report measures may need to be read to her in the future.

## 2018-03-02 NOTE — Patient Instructions (Signed)
I scheduled a follow-up to see one of our [place] Consultant when you see Dr. Ouida Sills on the 3rd.  We talked about taking your Prozac regularly. I am curious to see if you will notice an improvement in your mood if you take this medicine every day.

## 2018-03-02 NOTE — Assessment & Plan Note (Signed)
Patient is neatly groomed and appropriately dressed.  She does not maintain eye contact at all; she hangs her head the entire visit (discussed). She is cooperative and attentive. Speech is normal in tone, rate and rhythm.  Mood is depressed with a consistent affect.  Thought process is logical and goal directed.  Denied suicidal ideation.  Does not appear to be responding to any internal stimuli. Reports seeing "spirits" that she knows others don't see.  These are scary to her. Able to maintain train of thought and concentrate on the questions.  Insight seems below average.  Given family history, I wonder if she might benefit from another class of medication or perhaps the addition of one. She seems to be medication hesitant and finances are an issue so this would be a challenge to implement. If Prozac makes her worse or has no benefit, would more strongly consider this.  Consultation with Dr. Modesta Messing may be a good step.  Attempted to look at other interventions. She had a significant level of psychomotor retardation. There are multiple barriers to behavioral activation and there did not seem to be much room in the cognitive realm. Focused on rapport building and better adherence to medication as a potential stepping stone. Her goal for coming to today's visit was to hear some "encouraging words."  Patient is sure she is not taking Cymbalta. Updating her med list to reflect that she is taking Prozac and not taking Cymbalta is recommended.    Finances and feeling burdensome to others is a stress for her. She is in the process of appealing her disability.

## 2018-03-21 ENCOUNTER — Encounter: Payer: Self-pay | Admitting: Student in an Organized Health Care Education/Training Program

## 2018-03-21 ENCOUNTER — Ambulatory Visit (INDEPENDENT_AMBULATORY_CARE_PROVIDER_SITE_OTHER): Payer: Medicaid Other | Admitting: Student in an Organized Health Care Education/Training Program

## 2018-03-21 ENCOUNTER — Ambulatory Visit (INDEPENDENT_AMBULATORY_CARE_PROVIDER_SITE_OTHER): Payer: Medicaid Other | Admitting: Licensed Clinical Social Worker

## 2018-03-21 VITALS — BP 140/80 | HR 89 | Temp 98.4°F | Wt 134.8 lb

## 2018-03-21 DIAGNOSIS — D638 Anemia in other chronic diseases classified elsewhere: Secondary | ICD-10-CM

## 2018-03-21 DIAGNOSIS — D6489 Other specified anemias: Secondary | ICD-10-CM

## 2018-03-21 DIAGNOSIS — R600 Localized edema: Secondary | ICD-10-CM | POA: Diagnosis not present

## 2018-03-21 DIAGNOSIS — F329 Major depressive disorder, single episode, unspecified: Secondary | ICD-10-CM

## 2018-03-21 DIAGNOSIS — B86 Scabies: Secondary | ICD-10-CM | POA: Diagnosis not present

## 2018-03-21 DIAGNOSIS — L299 Pruritus, unspecified: Secondary | ICD-10-CM

## 2018-03-21 DIAGNOSIS — F32A Depression, unspecified: Secondary | ICD-10-CM

## 2018-03-21 DIAGNOSIS — N189 Chronic kidney disease, unspecified: Secondary | ICD-10-CM | POA: Diagnosis present

## 2018-03-21 MED ORDER — SERTRALINE HCL 50 MG PO TABS: 50.0000 mg | ORAL_TABLET | Freq: Every day | ORAL | 0 refills | Status: DC

## 2018-03-21 MED ORDER — PERMETHRIN 1 % EX LOTN: 2.0000 "application " | TOPICAL_LOTION | Freq: Once | CUTANEOUS | 1 refills | Status: AC

## 2018-03-21 MED ORDER — MEDICAL COMPRESSION STOCKINGS MISC: 1.0000 | Freq: Every day | 0 refills | Status: DC

## 2018-03-21 MED ORDER — DIPHENHYDRAMINE HCL 25 MG PO CAPS: 25.0000 mg | ORAL_CAPSULE | Freq: Four times a day (QID) | ORAL | 0 refills | Status: DC | PRN

## 2018-03-21 NOTE — Assessment & Plan Note (Signed)
x1 week bilateral 2+ pitting edema, unable to give hx of improvement with elevation, patient having difficulty wearing shoes Weight 12/2017 was 116lbs, this visit 03/2018 134lbs. Last echo in 05/2017 showed eEF 60-65%, G2DD H/o CKDIII Ordered CMP to check kidney function Will follow results and prescribe diuretic pending results Patient has had success with lasix in past Prescribe compression stockings

## 2018-03-21 NOTE — Addendum Note (Signed)
Addended by: Leonia Corona R on: 03/21/2018 10:36 AM   Modules accepted: Orders

## 2018-03-21 NOTE — BH Specialist Note (Signed)
Integrated Behavioral Health Follow Up Visit  MRN: [id number] Name: Andrea Glenn  Number of [place] Clinician visits: 2/6 Session Start time: 9:15  Session End time: 9:40 Total time: 20 minutes  Reason for follow-up: Continue brief intervention to assist patient with managing symptoms of depression, as well as grief .  Report of symptoms: difficulty with assessing symptoms today due to patient being very tearful. Currently taking Prozac for the past 2 months, Medication not on her med list.  [facility] Department to verify 20mg  of Prozac called in on June 10th by previous PCP.   ASSESSMENT: Mood: Anxious and Depressed ; Affect: Depressed and Tearful;Thought process: Coherent;  Denies SI. Patient keeps her head held down, will not look up and not engaged in conversation and rocking back and forth. Will answer in one to two words or nod head when asked questions. Patient does not seem to be making progress towards goal of reducing symptoms. Recent death of family member last week may have contributed to increase in symptoms today.  Patient may benefit from ongoing assessment and brief therapeutic interventions to assist with managing her symptoms however she did not commit to returning.  Assessed for safety measures and reviewed mobile crisis resource.  Consulted with provider today who will assess medication. GOALS:Patient will: 1. Reduce symptoms of: anxiety and depression 2. Increase knowledge and/or ability of: coping skills and self-management skills  3.   Adhere to medication compliance  PLAN:  1.Patient will F/U with [facility] in 1 week, or 2 weeks join visit with PCP 2.Behavioral recommendations: none at this tims 3. Patient will contact 911, go to ED or call Mobile Crisis if needed.  All contact information provided. 4. Referral: [place] (In Clinic) Intervention: Supportive Counseling, Relaxed breathing and emotional  support    Casimer Lanius, [place]   [phone] 10:03 AM

## 2018-03-21 NOTE — Assessment & Plan Note (Signed)
Unclear if patient has a pest problem at home- pt unable to provide clear history but denies seeing any insects Pt believes the itching comes from her being nervous Benadryl and ivermectin has improved symptoms in the past temporarily Prescribing permethrin topical and benadryl at this time

## 2018-03-21 NOTE — Assessment & Plan Note (Addendum)
Seen by Dr. Gwenlyn Saran 3 weeks ago for severe depression, patient had family death on thanksgiving and is tearful during exam Denies SI today Followed up with me and Ms. Moore today Stated she was taking prozec daily and not working- discontinued today (was filling irregularly at health department) Never picked up the prescribed cymbalta in past Prescribed zoloft 50mg  daily today Provided pt with suicide hotline number Provided with depression/suicide resources by Ms. Moore She declines weekly counseling 2/2 transportation issues Is willing to come every other week to see provider and counselor Will need follow up in 1 week by phone call for new medication response since will not come in for appointment Follow up in 2 weeks for therapy management

## 2018-03-21 NOTE — Assessment & Plan Note (Addendum)
Patient states her hands are white Ordered iron panel and CBC

## 2018-03-21 NOTE — Patient Instructions (Signed)
It was a pleasure to see you today!  To summarize our discussion for this visit:  Prescribing medication for your itching- a cream and oral  Checking labwork so we can give you medication for your leg swelling  Checking your medication for depression and setting up for resources  Suicide hotline- [phone]  Some additional health maintenance measures we should update are: . Counseling appointments every week . Check your new depression medication progress in 2 weeks  Call the clinic at [phone] if your symptoms worsen or you have any concerns.  Thank you for allowing me to take part in your care,  Dr. Doristine Mango   Thanks for choosing [facility] for your primary care.

## 2018-03-21 NOTE — Progress Notes (Signed)
Subjective:    Patient ID: Andrea Glenn, female    DOB: 01-26-1961, 57 y.o.   MRN: [id number]   CC: pruritis, LE edema, depression  HPI: Pruritis- Patient states that she is itchy all over her body. She denies any pest infestations. Becomes agitated when suggested that that could be the cause of the itching based on exam findings. She had two appointments for same problem in September. She states that the benadryl and ivermectin did help her at that time but the itching has gotten worse. She states that hot baths do help a little. Effects her daily life.  LE edema- duration 1 week. Has happened before. Patient can't state what made it better in past. She cannot describe if elevating legs makes it better or not. She states she has difficulty wearing shoes.  Depression- patient seen by Dr. Gwenlyn Saran and recommended medication change. Patient states she has been taking prozac daily for the past two months and it is not helping her. She had a death in the family on Thanksgiving day. She is not willing to talk about it further. She states that she is willing to seek counseling but cannot come every week 2/2 transportation issues.   Anemia- patient is concerned that her hands are white. She states that she feels like she is going to fall over when she is walking. Has a history of anemia  Smoking status reviewed   ROS: pertinent noted in the HPI   Past Medical History:  Diagnosis Date  . Diabetes mellitus without complication ([place])   . Edema of both legs   . Grief 09/08/2017  . Hypertension   . Neuropathy     Past Surgical History:  Procedure Laterality Date  . BACK SURGERY    . CESAREAN SECTION    . FOOT SURGERY    . SHOULDER SURGERY      Past medical history, surgical, family, and social history reviewed and updated in the EMR as appropriate.  Objective:  BP 140/80   Pulse 89   Temp 98.4 F (36.9 C)   Wt 134 lb 12.8 oz (61.1 kg)   SpO2 100%   BMI 23.14 kg/m   Vitals and  nursing note reviewed  General: NAD, pleasant, able to participate in exam Cardiac: RRR, S1 S2 present. normal heart sounds, no murmurs. Respiratory: CTAB, normal effort, No wheezes, rales or rhonchi Extremities: no edema or cyanosis. Skin: warm and dry, wide-spread excoriations on trunk and limbs. One lesion on right palm with hyperpigmented papules. Patient cannot see the lesion Neuro: alert, no obvious focal deficits Psych: flat affect, depressed mood. Tearful at times. Makes no eye contact. Minimal verbal responses to questions. Stares at lap for entire encounter. Easily agitated. Occasionally rocks her upper body back and forth.    Assessment & Plan:    Pruritus Unclear if patient has a pest problem at home- pt unable to provide clear history but denies seeing any insects Pt believes the itching comes from her being nervous Benadryl and ivermectin has improved symptoms in the past temporarily Prescribing permethrin topical and benadryl at this time   Lower extremity edema x1 week bilateral 2+ pitting edema, unable to give hx of improvement with elevation, patient having difficulty wearing shoes Weight 12/2017 was 116lbs, this visit 03/2018 134lbs. Last echo in 05/2017 showed eEF 60-65%, G2DD H/o CKDIII Ordered CMP to check kidney function Will follow results and prescribe diuretic pending results Patient has had success with lasix in past Prescribe compression stockings  Depression Seen by Dr. Gwenlyn Saran 3 weeks ago for severe depression, patient had family death on thanksgiving and is tearful during exam Denies SI today Followed up with me and Ms. Moore today Stated she was taking prozec daily and not working- discontinued today (was filling irregularly at health department) Never picked up the prescribed cymbalta in past Prescribed zoloft 50mg  daily today Provided pt with suicide hotline number Provided with depression/suicide resources by Ms. Moore She declines weekly  counseling 2/2 transportation issues Is willing to come every other week to see provider and counselor Will need follow up in 1 week by phone call for new medication response since will not come in for appointment Follow up in 2 weeks for therapy management   Anemia of chronic disease Patient states her hands are white Ordered iron panel and CBC   Doristine Mango, [place] PGY-1

## 2018-03-22 LAB — IRON,TIBC AND FERRITIN PANEL
FERRITIN: 396 ng/mL — AB (ref 15–150)
Iron Saturation: 35 % (ref 15–55)
Iron: 77 ug/dL (ref 27–159)
TIBC: 217 ug/dL — AB (ref 250–450)
UIBC: 140 ug/dL (ref 131–425)

## 2018-03-22 LAB — CBC
Hematocrit: 25 % — ABNORMAL LOW (ref 34.0–46.6)
Hemoglobin: 8 g/dL — ABNORMAL LOW (ref 11.1–15.9)
MCH: 27.2 pg (ref 26.6–33.0)
MCHC: 32 g/dL (ref 31.5–35.7)
MCV: 85 fL (ref 79–97)
Platelets: 210 10*3/uL (ref 150–450)
RBC: 2.94 x10E6/uL — ABNORMAL LOW (ref 3.77–5.28)
RDW: 13.9 % (ref 12.3–15.4)
WBC: 11.8 10*3/uL — ABNORMAL HIGH (ref 3.4–10.8)

## 2018-03-22 LAB — COMPREHENSIVE METABOLIC PANEL
ALK PHOS: 82 IU/L (ref 39–117)
ALT: 5 IU/L (ref 0–32)
AST: 17 IU/L (ref 0–40)
Albumin/Globulin Ratio: 1 — ABNORMAL LOW (ref 1.2–2.2)
Albumin: 2.9 g/dL — ABNORMAL LOW (ref 3.5–5.5)
BUN/Creatinine Ratio: 17 (ref 9–23)
BUN: 26 mg/dL — ABNORMAL HIGH (ref 6–24)
Bilirubin Total: 0.2 mg/dL (ref 0.0–1.2)
CHLORIDE: 111 mmol/L — AB (ref 96–106)
CO2: 20 mmol/L (ref 20–29)
CREATININE: 1.52 mg/dL — AB (ref 0.57–1.00)
Calcium: 8.7 mg/dL (ref 8.7–10.2)
GFR calc Af Amer: 43 mL/min/{1.73_m2} — ABNORMAL LOW (ref >59)
GFR calc non Af Amer: 38 mL/min/{1.73_m2} — ABNORMAL LOW (ref >59)
GLOBULIN, TOTAL: 3 g/dL (ref 1.5–4.5)
Glucose: 135 mg/dL — ABNORMAL HIGH (ref 65–99)
Potassium: 4.7 mmol/L (ref 3.5–5.2)
Sodium: 142 mmol/L (ref 134–144)
Total Protein: 5.9 g/dL — ABNORMAL LOW (ref 6.0–8.5)

## 2018-03-28 ENCOUNTER — Encounter: Payer: Self-pay | Admitting: Student in an Organized Health Care Education/Training Program

## 2018-04-04 ENCOUNTER — Ambulatory Visit (INDEPENDENT_AMBULATORY_CARE_PROVIDER_SITE_OTHER): Payer: Medicaid Other | Admitting: Family Medicine

## 2018-04-04 ENCOUNTER — Encounter: Payer: Self-pay | Admitting: Family Medicine

## 2018-04-04 ENCOUNTER — Other Ambulatory Visit: Payer: Self-pay

## 2018-04-04 VITALS — BP 108/58 | HR 87 | Temp 98.4°F | Wt 137.0 lb

## 2018-04-04 DIAGNOSIS — D649 Anemia, unspecified: Secondary | ICD-10-CM

## 2018-04-04 DIAGNOSIS — Z1159 Encounter for screening for other viral diseases: Secondary | ICD-10-CM

## 2018-04-04 DIAGNOSIS — R600 Localized edema: Secondary | ICD-10-CM | POA: Diagnosis not present

## 2018-04-04 DIAGNOSIS — N179 Acute kidney failure, unspecified: Secondary | ICD-10-CM | POA: Diagnosis not present

## 2018-04-04 DIAGNOSIS — E8809 Other disorders of plasma-protein metabolism, not elsewhere classified: Secondary | ICD-10-CM

## 2018-04-04 DIAGNOSIS — R7309 Other abnormal glucose: Secondary | ICD-10-CM

## 2018-04-04 DIAGNOSIS — E0590 Thyrotoxicosis, unspecified without thyrotoxic crisis or storm: Secondary | ICD-10-CM

## 2018-04-04 LAB — POCT GLYCOSYLATED HEMOGLOBIN (HGB A1C): HBA1C, POC (CONTROLLED DIABETIC RANGE): 6 % (ref 0.0–7.0)

## 2018-04-04 MED ORDER — CAPSAICIN 0.025 % EX CREA: TOPICAL_CREAM | Freq: Two times a day (BID) | CUTANEOUS | 0 refills | Status: DC | PRN

## 2018-04-04 NOTE — Patient Instructions (Signed)
Please in Dermatology clinic for possible biopsy and evaluation  It was wonderful to see you today.  Thank you for choosing [place].   Please call [phone] with any questions about today's appointment.  Please be sure to schedule follow up at the front  desk before you leave today.   Dorris Singh, MD  Family Medicine

## 2018-04-04 NOTE — Progress Notes (Signed)
Patient Name: Andrea Glenn Date of Birth: 1960/09/07 Date of Visit: 04/04/18 PCP: [place] Bing, DO  Chief Complaint: itching   Subjective: Andrea Glenn is a pleasant 57 y.o. year old woman with history of heart failure with preserved ejection fraction, mood disorder, chronic kidney disease, type 2 diabetes, and diabetic peripheral neuropathy presenting today for evaluation of multiple concerns,  Pruritus This problem started suddenly approximately 3 months ago around the end of August.  She has tried a number of medications for this including ivermectin and permethrin.  The underlying skin lesions are linear in nature and appear to be only in areas able to be easily reached.  She has declined biopsy previously.  She says ivermectin is the only thing that is helped for this.  Lower extremity swelling The patient reports this is been ongoing for about 9 months.  Over the past 2 weeks this is worsened.  She has gained a considerable amount of weight since September.  She denies orthopnea, paroxysmal nocturnal dyspnea, chest pain, palpitations, dyspnea on exertion.  She has been taken off her Lasix as it was thought that HCTZ may be more effective.  Anemia The patient has a history of normocytic anemia. She has never had a colonoscopy. Iron studies demonstrate an elevated ferritin with normal saturation.  She reports she has 2 meals a day.  She frequently has crackers for 1 meal.  Her other meals typically a salad with croutons ranch dressing and some Parmesan cheese.  She does not regularly eat meat.  She does not eat any processed foods.  She does not eat any red meat.  She reports a family history of vitamin B12 deficiency.  Social Concerns The patient currently has medication assistance program in [facility] family-planning.  This makes it difficult for her to undergo diagnostic testing and specialty evaluation.  Chronic Kidney Disease Baseline creatinine appears to  be approximately 1-1.3.  She reports a family history of chronic kidney disease.  She would never want to go on dialysis she reports she had 2 sisters on dialysis therapy.She does use BC powder intermittently.    ROS:  ROS As above.   I have reviewed the patient's medical, surgical, family, and social history as appropriate.   Vitals:   04/04/18 1535  BP: (!) 108/58  Pulse: 87  Temp: 98.4 F (36.9 C)  SpO2: 98%   Filed Weights   04/04/18 1535  Weight: 137 lb (62.1 kg)   Filed Weights   04/04/18 1535  Weight: 137 lb (62.1 kg)   HEENT: Sclera anicteric. Dentition is poor. Appears well hydrated. Neck: Supple Cardiac: Regular rate and rhythm. Normal S1/S2. No murmurs, rubs, or gallops appreciated.  JVP measures 8 cm Lungs: Clear bilaterally to ascultation.  Abdomen: Normoactive bowel sounds. No tenderness to deep or light palpation. No rebound or guarding.  Extremities: Warm, well perfused with significant 2+ pitting edema to knee. Skin: Warm, dry Psych: Anxious and intermittently tremoring throughout exam  57 year old woman with history of heart failure with preserved ejection fraction, chronic kidney disease, subclinical hyperthyroidism, and normocytic anemia presenting with variety of concerns.  The patient's biggest concern is her pruritus.  Hepatic and kidney function tests have been relatively within normal limits.  There may be an underlying dermatoses although her excoriations appear to be linear in nature and again are only in areas that she can reach.  Ivermectin did improve her symptoms favoring scabies as a cause.  Will refer to dermatology clinic for  further evaluation for this.  In terms of the patient's anemia, this may be due to underlying hematological malignancy as her white blood cell count was elevated on her prior values.  Differential also includes hemolytic anemia.  Will evaluate folate and vitamin B12 as well.  Concerned that underlying liver and renal disease  may be contributing to anemia of chronic disease.  Her transfusion level is 7 I am concerned that she may require transfusion in the very near future.  I discussed this with the patient at length. Obtained her cell phone number which is [phone].  In terms of the patient's worsening creatinine she has baseline chronic kidney disease but this is acutely worsened.  We discussed eliminated BC powder.  This may be contributing to her lower extremity edema.  She has never been evaluated for nephrotic syndrome.  Will obtain urine protein to creatinine today.  In terms of the patient's lower extremity edema I think this also is a broad differential but most likely this is due to her grade 2 diastolic dysfunction.  Based on her prior echocardiogram she also has some degree of secondary pulmonary arterial hypertension.  She likely does require regular Lasix use.  It is certainly possible that her lower extremity edema and worsening kidney function are due to hypervolemia and need for diuresis.  Will restart Lasix pending electrolytes.  Taking the above symptoms, physical exam signs and laboratory  findings into account, I am not certain a single etiology will be identified to explain the above findings.  Hematologic malignancy certainly could explain the above findings as could nephrotic syndrome.  I do suspect she has a component of heart failure with preserved ejection fraction.  Although she has gained weight, I suspect this is largely fluid and she needs diuresis.  I do think her overall nutritional status is quite poor and this may be contributing to her anemia, hypoalbuminemia and lower extremity edema.  Subclinical hyperthyroidism -     TSH  Lower extremity edema -     Protein / Creatinine Ratio, Urine -     Hepatic Function Panel -     ECHOCARDIOGRAM COMPLETE; Future -     Vitamin B12  Acute kidney injury (HCC) -     Phosphorus -     Basic Metabolic Panel  Normocytic anemia -     Vitamin  B12 -     Folate -     CBC -     Smear Review per Protocol -     Reticulocytes -     HIV antibody (with reflex) -     Lactate Dehydrogenase -     Haptoglobin -     Pathologist smear review  Encounter for hepatitis C screening test for low risk patient -     Hepatitis c antibody (reflex)  Elevated random blood glucose level -     HgB A1c  Hypoalbuminemia -     Lipid Panel  Chronic pain syndrome  -     capsaicin (ZOSTRIX) 0.025 % cream; Apply topically 2 (two) times daily as needed (pain).  Social Concerns, discussed with Kennyth Lose today.  Referred to Department of Social Services for applying to [facility] again.  I suspect the patient does qualify for Medicaid at this time.  Pulmonary Nodule-- noted on prior CT, needs repeat imaging in summer 2020.  Dorris Singh, MD  Family Medicine Teaching Service

## 2018-04-05 ENCOUNTER — Encounter (HOSPITAL_COMMUNITY): Payer: Self-pay | Admitting: Emergency Medicine

## 2018-04-05 ENCOUNTER — Inpatient Hospital Stay (HOSPITAL_COMMUNITY)
Admission: EM | Admit: 2018-04-05 | Discharge: 2018-04-10 | DRG: 683 | Disposition: A | Discharge status: Home / Self Care | Payer: Medicaid Other | Attending: Family Medicine | Admitting: Family Medicine

## 2018-04-05 ENCOUNTER — Telehealth: Payer: Self-pay | Admitting: Family Medicine

## 2018-04-05 ENCOUNTER — Other Ambulatory Visit: Payer: Self-pay

## 2018-04-05 DIAGNOSIS — E7800 Pure hypercholesterolemia, unspecified: Secondary | ICD-10-CM | POA: Diagnosis present

## 2018-04-05 DIAGNOSIS — R0902 Hypoxemia: Secondary | ICD-10-CM | POA: Diagnosis not present

## 2018-04-05 DIAGNOSIS — N2581 Secondary hyperparathyroidism of renal origin: Secondary | ICD-10-CM | POA: Diagnosis present

## 2018-04-05 DIAGNOSIS — N189 Chronic kidney disease, unspecified: Secondary | ICD-10-CM | POA: Insufficient documentation

## 2018-04-05 DIAGNOSIS — I951 Orthostatic hypotension: Secondary | ICD-10-CM | POA: Diagnosis present

## 2018-04-05 DIAGNOSIS — R600 Localized edema: Secondary | ICD-10-CM | POA: Diagnosis not present

## 2018-04-05 DIAGNOSIS — Z888 Allergy status to other drugs, medicaments and biological substances status: Secondary | ICD-10-CM

## 2018-04-05 DIAGNOSIS — E0821 Diabetes mellitus due to underlying condition with diabetic nephropathy: Secondary | ICD-10-CM

## 2018-04-05 DIAGNOSIS — Z7984 Long term (current) use of oral hypoglycemic drugs: Secondary | ICD-10-CM

## 2018-04-05 DIAGNOSIS — N183 Chronic kidney disease, stage 3 (moderate): Secondary | ICD-10-CM | POA: Diagnosis present

## 2018-04-05 DIAGNOSIS — M7989 Other specified soft tissue disorders: Secondary | ICD-10-CM | POA: Diagnosis not present

## 2018-04-05 DIAGNOSIS — I5032 Chronic diastolic (congestive) heart failure: Secondary | ICD-10-CM | POA: Diagnosis present

## 2018-04-05 DIAGNOSIS — L299 Pruritus, unspecified: Secondary | ICD-10-CM | POA: Diagnosis present

## 2018-04-05 DIAGNOSIS — E1142 Type 2 diabetes mellitus with diabetic polyneuropathy: Secondary | ICD-10-CM | POA: Diagnosis present

## 2018-04-05 DIAGNOSIS — I130 Hypertensive heart and chronic kidney disease with heart failure and stage 1 through stage 4 chronic kidney disease, or unspecified chronic kidney disease: Secondary | ICD-10-CM | POA: Diagnosis present

## 2018-04-05 DIAGNOSIS — N049 Nephrotic syndrome with unspecified morphologic changes: Secondary | ICD-10-CM | POA: Diagnosis present

## 2018-04-05 DIAGNOSIS — F329 Major depressive disorder, single episode, unspecified: Secondary | ICD-10-CM | POA: Diagnosis present

## 2018-04-05 DIAGNOSIS — R0602 Shortness of breath: Secondary | ICD-10-CM | POA: Diagnosis not present

## 2018-04-05 DIAGNOSIS — N3000 Acute cystitis without hematuria: Secondary | ICD-10-CM | POA: Diagnosis present

## 2018-04-05 DIAGNOSIS — E1165 Type 2 diabetes mellitus with hyperglycemia: Secondary | ICD-10-CM | POA: Diagnosis present

## 2018-04-05 DIAGNOSIS — E785 Hyperlipidemia, unspecified: Secondary | ICD-10-CM | POA: Diagnosis present

## 2018-04-05 DIAGNOSIS — N179 Acute kidney failure, unspecified: Principal | ICD-10-CM | POA: Diagnosis present

## 2018-04-05 DIAGNOSIS — D649 Anemia, unspecified: Secondary | ICD-10-CM | POA: Diagnosis present

## 2018-04-05 DIAGNOSIS — Z79899 Other long term (current) drug therapy: Secondary | ICD-10-CM | POA: Diagnosis not present

## 2018-04-05 DIAGNOSIS — F17210 Nicotine dependence, cigarettes, uncomplicated: Secondary | ICD-10-CM | POA: Diagnosis present

## 2018-04-05 DIAGNOSIS — E1122 Type 2 diabetes mellitus with diabetic chronic kidney disease: Secondary | ICD-10-CM | POA: Diagnosis present

## 2018-04-05 DIAGNOSIS — D631 Anemia in chronic kidney disease: Secondary | ICD-10-CM | POA: Diagnosis present

## 2018-04-05 HISTORY — DX: Anemia, unspecified: D64.9

## 2018-04-05 LAB — RETICULOCYTES: Retic Ct Pct: 3.7 % — ABNORMAL HIGH (ref 0.6–2.6)

## 2018-04-05 LAB — LACTATE DEHYDROGENASE: LDH: 388 IU/L — ABNORMAL HIGH (ref 119–226)

## 2018-04-05 LAB — CBC WITH DIFFERENTIAL/PLATELET
Abs Immature Granulocytes: 0.02 10*3/uL (ref 0.00–0.07)
BASOS PCT: 1 %
Basophils Absolute: 0.1 10*3/uL (ref 0.0–0.1)
EOS ABS: 0.3 10*3/uL (ref 0.0–0.5)
Eosinophils Relative: 3 %
HCT: 23.8 % — ABNORMAL LOW (ref 36.0–46.0)
Hemoglobin: 7 g/dL — ABNORMAL LOW (ref 12.0–15.0)
Immature Granulocytes: 0 %
Lymphocytes Relative: 37 %
Lymphs Abs: 3.7 10*3/uL (ref 0.7–4.0)
MCH: 27.2 pg (ref 26.0–34.0)
MCHC: 29.4 g/dL — AB (ref 30.0–36.0)
MCV: 92.6 fL (ref 80.0–100.0)
Monocytes Absolute: 0.5 10*3/uL (ref 0.1–1.0)
Monocytes Relative: 5 %
Neutro Abs: 5.4 10*3/uL (ref 1.7–7.7)
Neutrophils Relative %: 54 %
Platelets: 212 10*3/uL (ref 150–400)
RBC: 2.57 MIL/uL — ABNORMAL LOW (ref 3.87–5.11)
RDW: 16.3 % — ABNORMAL HIGH (ref 11.5–15.5)
WBC: 10 10*3/uL (ref 4.0–10.5)
nRBC: 0 % (ref 0.0–0.2)

## 2018-04-05 LAB — IRON AND TIBC
Iron: 62 ug/dL (ref 28–170)
Saturation Ratios: 22 % (ref 10.4–31.8)
TIBC: 280 ug/dL (ref 250–450)
UIBC: 218 ug/dL

## 2018-04-05 LAB — HIV ANTIBODY (ROUTINE TESTING W REFLEX): HIV SCREEN 4TH GENERATION: NONREACTIVE

## 2018-04-05 LAB — HEPATIC FUNCTION PANEL
ALT: 6 IU/L (ref 0–32)
AST: 19 IU/L (ref 0–40)
Albumin: 2.8 g/dL — ABNORMAL LOW (ref 3.5–5.5)
Alkaline Phosphatase: 90 IU/L (ref 39–117)
Bilirubin Total: 0.2 mg/dL (ref 0.0–1.2)
Bilirubin, Direct: 0.06 mg/dL (ref 0.00–0.40)
Total Protein: 5.8 g/dL — ABNORMAL LOW (ref 6.0–8.5)

## 2018-04-05 LAB — CBC
Hematocrit: 20.9 % — ABNORMAL LOW (ref 34.0–46.6)
Hemoglobin: 6.9 g/dL — CL (ref 11.1–15.9)
MCH: 28.5 pg (ref 26.6–33.0)
MCHC: 33 g/dL (ref 31.5–35.7)
MCV: 86 fL (ref 79–97)
Platelets: 209 10*3/uL (ref 150–450)
RBC: 2.42 x10E6/uL — AB (ref 3.77–5.28)
RDW: 14.5 % (ref 12.3–15.4)
WBC: 9.9 10*3/uL (ref 3.4–10.8)

## 2018-04-05 LAB — COMPREHENSIVE METABOLIC PANEL
ALT: 9 U/L (ref 0–44)
AST: 19 U/L (ref 15–41)
Albumin: 2.4 g/dL — ABNORMAL LOW (ref 3.5–5.0)
Alkaline Phosphatase: 69 U/L (ref 38–126)
Anion gap: 6 (ref 5–15)
BUN: 31 mg/dL — ABNORMAL HIGH (ref 6–20)
CO2: 23 mmol/L (ref 22–32)
Calcium: 8.3 mg/dL — ABNORMAL LOW (ref 8.9–10.3)
Chloride: 114 mmol/L — ABNORMAL HIGH (ref 98–111)
Creatinine, Ser: 1.6 mg/dL — ABNORMAL HIGH (ref 0.44–1.00)
GFR calc Af Amer: 41 mL/min — ABNORMAL LOW (ref >60)
GFR, EST NON AFRICAN AMERICAN: 35 mL/min — AB (ref >60)
Glucose, Bld: 133 mg/dL — ABNORMAL HIGH (ref 70–99)
Potassium: 4.2 mmol/L (ref 3.5–5.1)
Sodium: 143 mmol/L (ref 135–145)
Total Bilirubin: 0.6 mg/dL (ref 0.3–1.2)
Total Protein: 6.2 g/dL — ABNORMAL LOW (ref 6.5–8.1)

## 2018-04-05 LAB — LIPID PANEL
Chol/HDL Ratio: 4.9 ratio — ABNORMAL HIGH (ref 0.0–4.4)
Cholesterol, Total: 252 mg/dL — ABNORMAL HIGH (ref 100–199)
HDL: 51 mg/dL (ref >39)
LDL Calculated: 154 mg/dL — ABNORMAL HIGH (ref 0–99)
Triglycerides: 234 mg/dL — ABNORMAL HIGH (ref 0–149)
VLDL Cholesterol Cal: 47 mg/dL — ABNORMAL HIGH (ref 5–40)

## 2018-04-05 LAB — TSH: TSH: 0.766 u[IU]/mL (ref 0.450–4.500)

## 2018-04-05 LAB — BRAIN NATRIURETIC PEPTIDE: B NATRIURETIC PEPTIDE 5: 90.4 pg/mL (ref 0.0–100.0)

## 2018-04-05 LAB — BASIC METABOLIC PANEL
BUN/Creatinine Ratio: 19 (ref 9–23)
BUN: 34 mg/dL — ABNORMAL HIGH (ref 6–24)
CO2: 21 mmol/L (ref 20–29)
Calcium: 8.5 mg/dL — ABNORMAL LOW (ref 8.7–10.2)
Chloride: 109 mmol/L — ABNORMAL HIGH (ref 96–106)
Creatinine, Ser: 1.78 mg/dL — ABNORMAL HIGH (ref 0.57–1.00)
GFR calc Af Amer: 36 mL/min/{1.73_m2} — ABNORMAL LOW (ref >59)
GFR calc non Af Amer: 31 mL/min/{1.73_m2} — ABNORMAL LOW (ref >59)
Glucose: 140 mg/dL — ABNORMAL HIGH (ref 65–99)
Potassium: 4.6 mmol/L (ref 3.5–5.2)
Sodium: 144 mmol/L (ref 134–144)

## 2018-04-05 LAB — VITAMIN B12: Vitamin B-12: 529 pg/mL (ref 232–1245)

## 2018-04-05 LAB — HEPATITIS C ANTIBODY (REFLEX): HCV Ab: <0.1 s/co ratio (ref 0.0–0.9)

## 2018-04-05 LAB — PROTEIN / CREATININE RATIO, URINE
Creatinine, Urine: 88.6 mg/dL
Protein, Ur: 1109.2 mg/dL
Protein/Creat Ratio: 12519 mg/g creat — ABNORMAL HIGH (ref 0–200)

## 2018-04-05 LAB — HAPTOGLOBIN: Haptoglobin: 22 mg/dL — ABNORMAL LOW (ref 33–346)

## 2018-04-05 LAB — HCV COMMENT:

## 2018-04-05 LAB — PREPARE RBC (CROSSMATCH)

## 2018-04-05 LAB — POC OCCULT BLOOD, ED: Fecal Occult Bld: NEGATIVE

## 2018-04-05 LAB — GLUCOSE, CAPILLARY: Glucose-Capillary: 167 mg/dL — ABNORMAL HIGH (ref 70–99)

## 2018-04-05 LAB — FERRITIN: Ferritin: 328 ng/mL — ABNORMAL HIGH (ref 11–307)

## 2018-04-05 LAB — PHOSPHORUS: Phosphorus: 4.1 mg/dL (ref 2.5–4.5)

## 2018-04-05 LAB — FOLATE: Folate: 5 ng/mL (ref >3.0)

## 2018-04-05 MED ORDER — SODIUM CHLORIDE 0.9% IV SOLUTION: Freq: Once | INTRAVENOUS | Status: DC

## 2018-04-05 MED ORDER — DULOXETINE HCL 30 MG PO CPEP: 30.0000 mg | ORAL_CAPSULE | Freq: Every day | ORAL | Status: DC

## 2018-04-05 MED ORDER — SODIUM CHLORIDE 0.9 % IV SOLN: 250.0000 mL | INTRAVENOUS | Status: DC | PRN

## 2018-04-05 MED ORDER — SODIUM CHLORIDE 0.9% FLUSH: 3.0000 mL | INTRAVENOUS | Status: DC | PRN

## 2018-04-05 MED ORDER — ACETAMINOPHEN 650 MG RE SUPP: 650.0000 mg | Freq: Four times a day (QID) | RECTAL | Status: DC | PRN

## 2018-04-05 MED ORDER — POLYETHYLENE GLYCOL 3350 17 G PO PACK: 17.0000 g | PACK | Freq: Every day | ORAL | Status: DC | PRN

## 2018-04-05 MED ORDER — INSULIN ASPART 100 UNIT/ML ~~LOC~~ SOLN
0.0000 [IU] | Freq: Three times a day (TID) | SUBCUTANEOUS | Status: DC
  Administered 2018-04-06: 2 [IU] via SUBCUTANEOUS
  Administered 2018-04-07: 3 [IU] via SUBCUTANEOUS
  Administered 2018-04-08: 2 [IU] via SUBCUTANEOUS
  Administered 2018-04-08: 3 [IU] via SUBCUTANEOUS
  Administered 2018-04-09: 2 [IU] via SUBCUTANEOUS
  Administered 2018-04-09: 1 [IU] via SUBCUTANEOUS
  Administered 2018-04-09: 2 [IU] via SUBCUTANEOUS
  Administered 2018-04-10: 1 [IU] via SUBCUTANEOUS

## 2018-04-05 MED ORDER — SODIUM CHLORIDE 0.9% FLUSH
3.0000 mL | Freq: Two times a day (BID) | INTRAVENOUS | Status: DC
  Administered 2018-04-05 – 2018-04-09 (×6): 3 mL via INTRAVENOUS

## 2018-04-05 MED ORDER — INSULIN ASPART 100 UNIT/ML ~~LOC~~ SOLN: 0.0000 [IU] | Freq: Every day | SUBCUTANEOUS | Status: DC

## 2018-04-05 MED ORDER — ENOXAPARIN SODIUM 40 MG/0.4ML ~~LOC~~ SOLN: 40.0000 mg | SUBCUTANEOUS | Status: DC

## 2018-04-05 MED ORDER — SODIUM CHLORIDE 0.9% FLUSH
3.0000 mL | Freq: Two times a day (BID) | INTRAVENOUS | Status: DC
  Administered 2018-04-06 – 2018-04-10 (×6): 3 mL via INTRAVENOUS

## 2018-04-05 MED ORDER — SERTRALINE HCL 50 MG PO TABS
50.0000 mg | ORAL_TABLET | Freq: Every day | ORAL | Status: DC
  Administered 2018-04-05 – 2018-04-09 (×4): 50 mg via ORAL
  Filled 2018-04-05 (×5): qty 1

## 2018-04-05 MED ORDER — ACETAMINOPHEN 325 MG PO TABS
650.0000 mg | ORAL_TABLET | Freq: Four times a day (QID) | ORAL | Status: DC | PRN
  Administered 2018-04-06: 650 mg via ORAL
  Filled 2018-04-05: qty 2

## 2018-04-05 MED ORDER — CAPSAICIN 0.025 % EX CREA
TOPICAL_CREAM | Freq: Two times a day (BID) | CUTANEOUS | Status: DC | PRN
  Filled 2018-04-05: qty 60

## 2018-04-05 MED ORDER — DIPHENHYDRAMINE HCL 25 MG PO CAPS
25.0000 mg | ORAL_CAPSULE | Freq: Four times a day (QID) | ORAL | Status: DC | PRN
  Administered 2018-04-05 – 2018-04-10 (×5): 25 mg via ORAL
  Filled 2018-04-05 (×5): qty 1

## 2018-04-05 NOTE — ED Triage Notes (Signed)
Patient c/o fatigue and bilateral lower leg swelling. Patient states she went to [facility] for blood work and was called stating she had "low blood count." Patient appears very weak, alert and orientated x 4.

## 2018-04-05 NOTE — Telephone Encounter (Signed)
Attempted to call patient. Patient's hemoglobin 6.9----needs to present to Emergency Room as soon as possible. Left voicemail. Will try again later this AM.  Dorris Singh, MD  [facility] Medicine Teaching Service

## 2018-04-05 NOTE — H&P (Signed)
[facility] Admission History and Physical Service Pager: [phone]  Patient name: Andrea Glenn Medical record number: [id number] Date of birth: 1960-06-25 Age: 57 y.o. Gender: female  Primary Care Provider: [place] Bing, DO Consultants: none Code Status: full  Chief Complaint: fatigue, dyspnea on exertion  Assessment and Plan: Andrea Glenn is a 57 y.o. female presenting with a two day history of increasing fatigue found to have Hgb of 6.9 in clinic yesterday . PMH is significant for chronic anemia, CKD, HFpEF, DM-II w/ peripheral neuropathy, depression, HLD, HTN, pruritus.   Symptomatic Normocytic Anemia - recent iron studies support anemia of chronic inflammation vs mixed cause. Haptoglobin low with increased retic ct rasing suspicion for hemolytic anemia. Patient presented to the family medicine clinic yesterday for an evaluation of pruritus and was found to have a Hgb of 6.9.  Transfusion threshold is 7.0. Two weeks prior to this the patient had a Hgb of 8.0 and for the past year she has had Hgb levels between 9-10. Patient has had increasing fatigue over the past two days.  She denies blood in her stool or vaginal bleeding.  Patient has never had a colonoscopy. FOBT negative. She has had no changes in appetite. On exam patient had pitting edema bilaterally, pale conjuntiva.  Patient has CKD but not severe enough to suggest it could be the cause of her anemia.   Patient received 1 U pRBC in the ED. Other possible etiologies of anemia are blood loss vs malignancy.  - admit to telemetry, inpatient.  Dr. Mingo Amber attending - transfuse 1 u pRBC - f/u CBC post transfusion - continuous cardiac monitoring  AKI on CKD - today patient's creatinine was 1.6 (1.78 yesterday) yesterday patient's baseline Cr is approximatley 1-1.3.  Not oligouric. BUN was 30 Patient reports using BC powder occasionally for headache. patient getting fluids with pRBC. Getting f/u bmp  tomorrow. Can give more fluids if Cr still elevated.  - f/u bmp tomorrow.  Give fluids if needed.   Nephrotic syndrome - patient had Protein: Creatinine ratio was 12,519 yesterday.  Patient also has low albumin (2.4) and elevated cholesterol (252).  On exam she has pitting edema bilaterally in her legs. Differential diagnosis includes the normal causes of nephrotic syndrome ( IgA nephropathy, FSGS, membranous nephropathy). - consider neprho consult in the AM - consider 24hr urine protein  Dysuria - patient complains of dysuria over the past few days.  No increase in urinary frequency. Urinalysis pending  - f/u urinalysis.    HFpEF - BNP was 90.4 today.  most recent echo in February showed 60-65% EF, no wall motion abnormalities, normal systolic function, Grade 2 diastolic dysfuntion.  PA pressure 25mmHg.  - f/u TTE  Non-insulin dependant Diabetes mellitus II w/ peripheral neuropathy - history of poor control, recently improved. patient takes dulaglutide 1.5mg  qWeekly. Her recent was 6.0. it was 7.7 3 months prior to that.  - sliding scale insulin w/ meals and HS - CBG w/ meals and nightly     Depression - patient takes zoloft for depression.  She had a recent death in her family on thanksgiving. She was taking prozac before that, and had a prescription for duloxetine that she never filled.   - continue zoloft 50mg   HLD - lipid panel today shows cholesterol 252, LDL 47,  TAG 234.  LDL has been consistently elevated for 4 years. Patient has allergy for statins indicating 'suicidal thoughts'.  Elevated levels of cholesterol could be result of  nephrotic syndrome.  - do not start statin.    HTN - patient has been prescribed HCTZ for HTN although it is not listed among her home meds.  Today her SBP hs been 130-160. Amlodipine was stopped in response to leg swelling.   - consider restarting HCTZ.  Pruritus - has been occurring since august.  Patient has tried multiple treatments including  ivrmectin and permethrin.  She states the ivermectin really helped reduce her pruritus but it came back after she stopped taking the medicine.  Patient has hyperpigmentation from excoriation on her elbows and shins.  Originally thought to be scabies though patient denies the possibility.  - benadryl 25mg  q6h PRN for itch - capsaicin cream BID PRN  FEN/GI: carb modified diet.  Prophylaxis: lovenox  Disposition: telemetry, inpatient.   History of Present Illness:  Andrea Glenn is a 57 y.o. female presenting with fatigue and a Hgb of 6.9 yesterday in clinic. PMH is significant for chronic anemia, CKD, HFpEF, DM-II w/ peripheral neuropathy, depression, HLD, HTN, pruritus.   Patient went to the family medicine clinic yesterday for evaluation of her pruritus.  She mentioned increasing fatigue and a subsequent Hgb was 6.9.  Dr. Owens Shark called patient and told her to come to the ED for evaluation and treatment of her anemia. Patient came to ED today.  Today she states she feels more tired than the previous day.  She states she has been feeling 'weak' and that her legs have felt swollen and 'heavy'.  She has been on lasix before but states she was switched to HCTZ not that long ago.  She has not had a decrease in appetite, and actually states it has probably increased. She does not eat processed meats.  She endorses occasional dizziness and says that her fingers will feel 'tingly' when she gets lightheaded. Patient fell about 4 days ago but denies LOC or hitting her head.   She has longstanding pins and needles feelings in her legs from diabetic nephropathy and she states this is the reason she no longer drives.   Patient states she has had difficulty sleeping over the past few weeks and attributes it to her history of working third shifts. Patient has a history of unexplained pruritus.  She states ivermectin helped but the pruritus returned when she stopped taking it.   She denies bloody/black stools,  vaginal bleeding, chest pain, abdominal pain, n/v/d.    Review Of Systems: See HPI for pertinent  Review of Systems  Constitutional: Positive for malaise/fatigue. Negative for chills and fever.  HENT: Negative for congestion and sore throat.   Eyes: Positive for blurred vision. Negative for double vision.  Respiratory: Negative for cough and shortness of breath.   Cardiovascular: Positive for leg swelling. Negative for chest pain.  Gastrointestinal: Negative for abdominal pain, blood in stool, melena, nausea and vomiting.  Genitourinary: Negative for dysuria and hematuria.       No vaginal bleeding  Musculoskeletal: Positive for falls and myalgias.  Skin: Positive for itching and rash.  Neurological: Positive for dizziness and weakness. Negative for seizures, loss of consciousness and headaches.    Patient Active Problem List   Diagnosis Date Noted  . Pruritus 01/03/2018  . Scabies 12/15/2017  . Solitary pulmonary nodule 10/10/2017  . Depression 09/26/2017  . Urinary tract infection without hematuria   . Postherpetic neuralgia 09/08/2017  . Tremor   . Frequent falls 07/22/2017  . Dehydration 07/22/2017  . CKD (chronic kidney disease) 07/08/2017  . Anemia  of chronic disease 06/21/2017  . Primary hypertension 02/03/2017  . Hematuria 01/20/2017  . Dysuria 01/20/2017  . Lower extremity edema 12/30/2016  . Pancreatitis 10/08/2016  . Hyperlipidemia associated with type 2 diabetes mellitus ([place]) 10/26/2013  . Restless leg syndrome 03/03/2010  . Uncontrolled type 2 diabetes mellitus with peripheral neuropathy ([place]) 06/16/2006  . Tobacco use disorder 06/16/2006    Past Medical History: Past Medical History:  Diagnosis Date  . Diabetes mellitus without complication ([place])   . Edema of both legs   . Grief 09/08/2017  . Hypertension   . Neuropathy     Past Surgical History: Past Surgical History:  Procedure Laterality Date  . BACK SURGERY    . CESAREAN SECTION    . FOOT  SURGERY    . SHOULDER SURGERY      Social History: Social History   Tobacco Use  . Smoking status: Current Every Day Smoker    Packs/day: 0.10    Years: 20.00    Pack years: 2.00    Types: Cigarettes    Start date: 89  . Smokeless tobacco: Never Used  . Tobacco comment: working on quitting - 2 per day - 2.26.19  Substance Use Topics  . Alcohol use: No  . Drug use: No    Family History: Family History  Problem Relation Age of Onset  . Kidney failure Mother   . Diabetes Mother   . Thyroid disease Mother   . Hypertension Mother   . Heart failure Mother   . Kidney failure Father   . Cancer Sister   . Stroke Brother   . Cancer Other     Allergies and Medications: Allergies  Allergen Reactions  . Atorvastatin Other (See Comments)    Suicidal thoughts  . Statins Other (See Comments)    This class of meds causes "suicidal thoughts"  . Amitriptyline Other (See Comments)    Depression  . Metformin And Related Diarrhea  . Adhesive [Tape] Other (See Comments)    "Sometimes peels off my skin"  . Gabapentin Other (See Comments)    "Gives me the shakes"   No current facility-administered medications on file prior to encounter.    Current Outpatient Medications on File Prior to Encounter  Medication Sig Dispense Refill  . acetaminophen (TYLENOL) 500 MG tablet Take 1,000 mg by mouth every 6 (six) hours as needed (for pain or headaches).     . capsaicin (ZOSTRIX) 0.025 % cream Apply topically 2 (two) times daily as needed (pain). 60 g 0  . diphenhydrAMINE (BENADRYL ALLERGY) 25 mg capsule Take 1 capsule (25 mg total) by mouth every 6 (six) hours as needed. 30 capsule 0  . Dulaglutide 1.5 MG/0.5ML SOPN Inject 1.5 mg into the skin once a week. 4 pen 11  . DULoxetine (CYMBALTA) 30 MG capsule Take 1 capsule (30 mg total) by mouth daily. 30 capsule 2  . FLUoxetine (PROZAC) 20 MG capsule Take 20 mg by mouth daily.    . hydrOXYzine (ATARAX/VISTARIL) 25 MG tablet Take 1 tablet (25  mg total) by mouth 3 (three) times daily as needed. 60 tablet 0  . sertraline (ZOLOFT) 50 MG tablet Take 1 tablet (50 mg total) by mouth daily. 30 tablet 0  . bisacodyl (DULCOLAX) 5 MG EC tablet Take 1 tablet (5 mg total) by mouth daily as needed for moderate constipation. (Patient not taking: Reported on 04/05/2018) 30 tablet 0  . clotrimazole (LOTRIMIN) 1 % cream Apply 1 application topically 2 (two) times daily. (Patient  not taking: Reported on 04/05/2018) 60 g 0  . Elastic Bandages & Supports (MEDICAL COMPRESSION STOCKINGS) MISC 1 Package by Does not apply route daily. (Patient not taking: Reported on 04/05/2018) 1 each 0  . polyvinyl alcohol (LIQUIFILM TEARS) 1.4 % ophthalmic solution Place 2 drops into both eyes 4 (four) times daily. (Patient not taking: Reported on 04/05/2018) 15 mL 0    Objective: BP (!) 155/79   Pulse 87   Temp 98.1 F (36.7 C) (Oral)   Resp 15   Wt 62.1 kg   SpO2 100%   BMI 23.52 kg/m  Exam: General: alert and oriented.  No acute distress.  Eyes: PERRL. EOMI.  Pale conjunctiva.   ENTM: moist oral mucosa.   Cardiovascular: regular rhythm. Normal rate. Aortic/pulmonic systolic murmur. Bilateral pitting edema 1+ up to mid foreleg.   Respiratory: minimal bibasilar crackles.  No wheezing. No increased WOB. No tachypnea.  Gastrointestinal: soft, nontender.  Normal bowel sounds.   MSK: moves extremities spontaneously.   Derm: hyperpigmentation and excoriation marks on elbows, knees and shins bilaterally.  Neuro: CN II-XII grossly intact.   Psych: pleasant affect.  Spontaneous speech.  Good eye contact.    Labs and Imaging: CBC BMET  Recent Labs  Lab 04/05/18 1702  WBC 10.0  HGB 7.0*  HCT 23.8*  PLT 212   Recent Labs  Lab 04/05/18 1702  NA 143  K 4.2  CL 114*  CO2 23  BUN 31*  CREATININE 1.60*  GLUCOSE 133*  CALCIUM 8.3Benay Pike, MD 04/05/2018, 6:45 PM PGY-1, [place] Intern pager: [phone], text pages  welcome  Upper Level Addendum: I have seen and evaluated this patient along with Dr. Jeannine Kitten and reviewed the above note, making necessary revisions in blue.  Harriet Butte, [place], PGY-3

## 2018-04-05 NOTE — ED Provider Notes (Signed)
[facility] EMERGENCY DEPARTMENT Provider Note   CSN: [id number] Arrival date & time: 04/05/18  1627     History   Chief Complaint Chief Complaint  Patient presents with  . Anemia    HPI Andrea Glenn is a 57 y.o. female with past medical history of CKD, type 2 diabetes, diabetic neuropathy, hypertension, running to the emergency department with low hemoglobin level per PCP.  Patient was evaluated by her primary care yesterday who ordered multiple lab tests in the setting of worsening lower extremity swelling x1 week, as well as to follow-up on her chronic normocytic anemia and CKD.  CBC revealed a hemoglobin of 6.9 with a hematocrit of 20.9.  White blood cell count is within normal range.  Platelets within normal range.  Patient states she has been feeling increasingly fatigued for the past week.  States she does not exert herself much because she has chronic history of orthostatic hypotension and feels very dizzy with standing.  She states for the past week she has also been noticing worsening lower extremity swelling.  Per chart review she last echo was done in February of this year, with normal EF and mild diastolic dysfunction.  She reports about 20 pound weight gain within the past couple of weeks.  No cough or new shortness of breath. Denies melena, blood in stool hematuria. Not on anticoagulation. She has never required transfusion, per PCP note her threshold for transfusion is hgb of 7.  The history is provided by the patient and medical records.    Past Medical History:  Diagnosis Date  . Diabetes mellitus without complication ([place])   . Edema of both legs   . Grief 09/08/2017  . Hypertension   . Neuropathy     Patient Active Problem List   Diagnosis Date Noted  . Pruritus 01/03/2018  . Scabies 12/15/2017  . Solitary pulmonary nodule 10/10/2017  . Depression 09/26/2017  . Urinary tract infection without hematuria   . Postherpetic neuralgia  09/08/2017  . Tremor   . Frequent falls 07/22/2017  . Dehydration 07/22/2017  . CKD (chronic kidney disease) 07/08/2017  . Anemia of chronic disease 06/21/2017  . Primary hypertension 02/03/2017  . Hematuria 01/20/2017  . Dysuria 01/20/2017  . Lower extremity edema 12/30/2016  . Pancreatitis 10/08/2016  . Hyperlipidemia associated with type 2 diabetes mellitus ([place]) 10/26/2013  . Restless leg syndrome 03/03/2010  . Uncontrolled type 2 diabetes mellitus with peripheral neuropathy ([place]) 06/16/2006  . Tobacco use disorder 06/16/2006    Past Surgical History:  Procedure Laterality Date  . BACK SURGERY    . CESAREAN SECTION    . FOOT SURGERY    . SHOULDER SURGERY       OB History   No obstetric history on file.      Home Medications    Prior to Admission medications   Medication Sig Start Date End Date Taking? Authorizing Provider  acetaminophen (TYLENOL) 500 MG tablet Take 1,000 mg by mouth every 6 (six) hours as needed (for pain or headaches).    Yes [provider]  capsaicin (ZOSTRIX) 0.025 % cream Apply topically 2 (two) times daily as needed (pain). 04/04/18  Yes Martyn Malay, MD  diphenhydrAMINE (BENADRYL ALLERGY) 25 mg capsule Take 1 capsule (25 mg total) by mouth every 6 (six) hours as needed. 03/21/18  Yes Anderson, Chelsey L, DO  Dulaglutide 1.5 MG/0.5ML SOPN Inject 1.5 mg into the skin once a week. 11/02/17  Yes Hensel, Jamal Collin, MD  DULoxetine (CYMBALTA) 30 MG capsule Take 1 capsule (30 mg total) by mouth daily. 11/10/17  Yes Hensel, Jamal Collin, MD  FLUoxetine (PROZAC) 20 MG capsule Take 20 mg by mouth daily.   Yes [provider]  hydrOXYzine (ATARAX/VISTARIL) 25 MG tablet Take 1 tablet (25 mg total) by mouth 3 (three) times daily as needed. 01/16/18  Yes Bland, Scott, DO  sertraline (ZOLOFT) 50 MG tablet Take 1 tablet (50 mg total) by mouth daily. 03/21/18  Yes Anderson, Chelsey L, DO  bisacodyl (DULCOLAX) 5 MG EC tablet Take 1 tablet (5 mg total)  by mouth daily as needed for moderate constipation. Patient not taking: Reported on 04/05/2018 07/25/17   Patrecia Pour, Christean Grief, MD  clotrimazole (LOTRIMIN) 1 % cream Apply 1 application topically 2 (two) times daily. Patient not taking: Reported on 04/05/2018 10/07/17   Tonette Bihari, MD  Elastic Bandages & Supports (MEDICAL COMPRESSION STOCKINGS) [place] 1 Package by Does not apply route daily. Patient not taking: Reported on 04/05/2018 03/21/18   Doristine Mango L, DO  polyvinyl alcohol (LIQUIFILM TEARS) 1.4 % ophthalmic solution Place 2 drops into both eyes 4 (four) times daily. Patient not taking: Reported on 04/05/2018 09/12/17   Lovenia Kim, MD    Family History Family History  Problem Relation Age of Onset  . Kidney failure Mother   . Diabetes Mother   . Thyroid disease Mother   . Hypertension Mother   . Heart failure Mother   . Kidney failure Father   . Cancer Sister   . Stroke Brother   . Cancer Other     Social History Social History   Tobacco Use  . Smoking status: Current Every Day Smoker    Packs/day: 0.10    Years: 20.00    Pack years: 2.00    Types: Cigarettes    Start date: 54  . Smokeless tobacco: Never Used  . Tobacco comment: working on quitting - 2 per day - 2.26.19  Substance Use Topics  . Alcohol use: No  . Drug use: No     Allergies   Atorvastatin; Statins; Amitriptyline; Metformin and related; Adhesive [tape]; and Gabapentin   Review of Systems Review of Systems  Constitutional: Positive for fatigue.  Cardiovascular: Positive for leg swelling.  Gastrointestinal: Negative for abdominal pain and blood in stool.  Genitourinary: Negative for hematuria.  Hematological: Does not bruise/bleed easily.  All other systems reviewed and are negative.    Physical Exam Updated Vital Signs BP (!) 144/62   Pulse 80   Temp 98.1 F (36.7 C) (Oral)   Resp 16   Wt 62.1 kg   SpO2 99%   BMI 23.52 kg/m   Physical Exam Vitals signs and nursing  note reviewed.  Constitutional:      Appearance: She is well-developed.     Comments: Slightly pale-appearing.  HENT:     Head: Normocephalic and atraumatic.  Eyes:     Comments: Conjunctivae are pale.  Neck:     Musculoskeletal: Normal range of motion.  Cardiovascular:     Rate and Rhythm: Normal rate and regular rhythm.     Pulses: Normal pulses.     Heart sounds: Normal heart sounds.  Pulmonary:     Effort: Pulmonary effort is normal.     Breath sounds: Normal breath sounds.  Abdominal:     General: Bowel sounds are normal.     Palpations: Abdomen is soft. There is no mass.     Tenderness: There is no abdominal tenderness.  There is no guarding.  Musculoskeletal:     Comments: 3+ pretibial pitting edema b/l lower extremities.   Skin:    General: Skin is warm.  Neurological:     Mental Status: She is alert.  Psychiatric:        Mood and Affect: Mood normal.        Behavior: Behavior normal.      ED Treatments / Results  Labs (all labs ordered are listed, but only abnormal results are displayed) Labs Reviewed  COMPREHENSIVE METABOLIC PANEL - Abnormal; Notable for the following components:      Result Value   Chloride 114 (*)    Glucose, Bld 133 (*)    BUN 31 (*)    Creatinine, Ser 1.60 (*)    Calcium 8.3 (*)    Total Protein 6.2 (*)    Albumin 2.4 (*)    GFR calc non Af Amer 35 (*)    GFR calc Af Amer 41 (*)    All other components within normal limits  CBC WITH DIFFERENTIAL/PLATELET - Abnormal; Notable for the following components:   RBC 2.57 (*)    Hemoglobin 7.0 (*)    HCT 23.8 (*)    MCHC 29.4 (*)    RDW 16.3 (*)    All other components within normal limits  FERRITIN - Abnormal; Notable for the following components:   Ferritin 328 (*)    All other components within normal limits  BRAIN NATRIURETIC PEPTIDE  IRON AND TIBC  URINALYSIS, ROUTINE W REFLEX MICROSCOPIC  POC OCCULT BLOOD, ED  TYPE AND SCREEN  PREPARE RBC (CROSSMATCH)  ABO/RH     EKG None  Radiology No results found.  Procedures Procedures (including critical care time)  CRITICAL CARE Performed by: [place] N Willma Obando   Total critical care time: 35 minutes  Critical care time was exclusive of separately billable procedures and treating other patients.  Critical care was necessary to treat or prevent imminent or life-threatening deterioration.  Critical care was time spent personally by me on the following activities: development of treatment plan with patient and/or surrogate as well as nursing, discussions with consultants, evaluation of patient's response to treatment, examination of patient, obtaining history from patient or surrogate, ordering and performing treatments and interventions, ordering and review of laboratory studies, ordering and review of radiographic studies, pulse oximetry and re-evaluation of patient's condition.   Medications Ordered in ED Medications  0.9 %  sodium chloride infusion (Manually program via Guardrails IV Fluids) (has no administration in time range)     Initial Impression / Assessment and Plan / ED Course  I have reviewed the triage vital signs and the nursing notes.  Pertinent labs & imaging results that were available during my care of the patient were reviewed by me and considered in my medical decision making (see chart for details).  Clinical Course as of Apr 05 1920  Wed Apr 05, 2018  1859 Dr. Irish Elders with Family Medicine accepting admission.   [JR]    Clinical Course User Index [JR] Evanthia Maund, [place] N, PA-C    Patient presenting with low hemoglobin from PCP.  Patient endorsing increasing fatigue for the past week, as well as lower extremity swelling with almost 20 pound weight gain.  PCP ordered multiple lab tests yesterday, revealing hemoglobin of 6.9, down from 8 two weeks ago.  Labs also revealed an AKI with creatinine 1.78, per PCP note her baseline is 1.2-3.  On exam, she is pale appearing though  hemodynamically stable.  Abdomen is benign.  Occult is negative.  CBC without leukocytosis, hemoglobin is 7, hematocrit 23.8.  Creatinine 1.6.  BNP ordered to evaluate lower extremity swelling, is within normal limits. Lungs are clear. Echo done in February of this year with normal EF.  Iron studies normal.  1 unit ordered for transfusion.  Patient admitted to family medicine service for symptomatic anemia of unknown origin and AKI.  The patient appears reasonably stabilized for admission considering the current resources, flow, and capabilities available in the ED at this time, and I doubt any other [facility] requiring further screening and/or treatment in the ED prior to admission.  Final Clinical Impressions(s) / ED Diagnoses   Final diagnoses:  Symptomatic anemia  AKI (acute kidney injury) ([place])  Leg swelling    ED Discharge Orders    None       Ashrith Sagan, [place] N, PA-C 04/05/18 [place], [place], DO 04/06/18 [phone]

## 2018-04-05 NOTE — ED Notes (Signed)
Admitting at bedside 

## 2018-04-05 NOTE — ED Notes (Signed)
Ordered dinner tray.  

## 2018-04-05 NOTE — Telephone Encounter (Signed)
Called patient regarding results. Hgb 6.9, creatinine rising. Recommended immediate evaluation in the Emergency Department. Reviewed reasons to call and return to care.   Dorris Singh, MD  Family Medicine Teaching Service

## 2018-04-06 ENCOUNTER — Inpatient Hospital Stay (HOSPITAL_COMMUNITY): Payer: Medicaid Other

## 2018-04-06 ENCOUNTER — Other Ambulatory Visit: Payer: Self-pay

## 2018-04-06 ENCOUNTER — Encounter (HOSPITAL_COMMUNITY): Payer: Self-pay | Admitting: *Deleted

## 2018-04-06 DIAGNOSIS — R0602 Shortness of breath: Secondary | ICD-10-CM

## 2018-04-06 LAB — GLUCOSE, CAPILLARY
GLUCOSE-CAPILLARY: 120 mg/dL — AB (ref 70–99)
Glucose-Capillary: 125 mg/dL — ABNORMAL HIGH (ref 70–99)
Glucose-Capillary: 180 mg/dL — ABNORMAL HIGH (ref 70–99)
Glucose-Capillary: 127 mg/dL — ABNORMAL HIGH (ref 70–99)

## 2018-04-06 LAB — BASIC METABOLIC PANEL
Anion gap: 7 (ref 5–15)
BUN: 33 mg/dL — ABNORMAL HIGH (ref 6–20)
CO2: 21 mmol/L — AB (ref 22–32)
Calcium: 8 mg/dL — ABNORMAL LOW (ref 8.9–10.3)
Chloride: 113 mmol/L — ABNORMAL HIGH (ref 98–111)
Creatinine, Ser: 1.76 mg/dL — ABNORMAL HIGH (ref 0.44–1.00)
GFR calc Af Amer: 36 mL/min — ABNORMAL LOW (ref >60)
GFR calc non Af Amer: 31 mL/min — ABNORMAL LOW (ref >60)
Glucose, Bld: 110 mg/dL — ABNORMAL HIGH (ref 70–99)
Potassium: 4.4 mmol/L (ref 3.5–5.1)
Sodium: 141 mmol/L (ref 135–145)

## 2018-04-06 LAB — URINALYSIS, ROUTINE W REFLEX MICROSCOPIC
Bilirubin Urine: NEGATIVE
GLUCOSE, UA: 150 mg/dL — AB
Hgb urine dipstick: NEGATIVE
Ketones, ur: NEGATIVE mg/dL
NITRITE: POSITIVE — AB
Specific Gravity, Urine: 1.015 (ref 1.005–1.030)
WBC, UA: >50 WBC/hpf — ABNORMAL HIGH (ref 0–5)
pH: 8 (ref 5.0–8.0)

## 2018-04-06 LAB — SODIUM, URINE, RANDOM: Sodium, Ur: 90 mmol/L

## 2018-04-06 LAB — BPAM RBC
Blood Product Expiration Date: 201912242359
ISSUE DATE / TIME: 201912181913
Unit Type and Rh: 600

## 2018-04-06 LAB — CBC WITH DIFFERENTIAL/PLATELET
Abs Immature Granulocytes: 0.04 10*3/uL (ref 0.00–0.07)
Basophils Absolute: 0.1 10*3/uL (ref 0.0–0.1)
Basophils Relative: 1 %
Eosinophils Absolute: 0.3 10*3/uL (ref 0.0–0.5)
Eosinophils Relative: 3 %
HCT: 24.5 % — ABNORMAL LOW (ref 36.0–46.0)
Hemoglobin: 7.6 g/dL — ABNORMAL LOW (ref 12.0–15.0)
IMMATURE GRANULOCYTES: 0 %
Lymphocytes Relative: 40 %
Lymphs Abs: 4 10*3/uL (ref 0.7–4.0)
MCH: 28 pg (ref 26.0–34.0)
MCHC: 31 g/dL (ref 30.0–36.0)
MCV: 90.4 fL (ref 80.0–100.0)
MONOS PCT: 6 %
Monocytes Absolute: 0.6 10*3/uL (ref 0.1–1.0)
NEUTROS PCT: 50 %
Neutro Abs: 4.9 10*3/uL (ref 1.7–7.7)
Platelets: 175 10*3/uL (ref 150–400)
RBC: 2.71 MIL/uL — ABNORMAL LOW (ref 3.87–5.11)
RDW: 15.8 % — ABNORMAL HIGH (ref 11.5–15.5)
WBC: 9.8 10*3/uL (ref 4.0–10.5)
nRBC: 0 % (ref 0.0–0.2)

## 2018-04-06 LAB — TYPE AND SCREEN
ABO/RH(D): AB POS
Antibody Screen: NEGATIVE
Unit division: 0

## 2018-04-06 LAB — ECHOCARDIOGRAM COMPLETE
Height: 64 in
Weight: 2192 oz

## 2018-04-06 LAB — ABO/RH: ABO/RH(D): AB POS

## 2018-04-06 LAB — CREATININE, URINE, RANDOM: Creatinine, Urine: 70.1 mg/dL

## 2018-04-06 MED ORDER — FUROSEMIDE 10 MG/ML IJ SOLN
40.0000 mg | Freq: Two times a day (BID) | INTRAMUSCULAR | Status: DC
  Administered 2018-04-06 – 2018-04-08 (×4): 40 mg via INTRAVENOUS
  Filled 2018-04-06 (×4): qty 4

## 2018-04-06 NOTE — Consult Note (Signed)
Andrea Glenn is an 57 y.o. female.    Chief Complaint: fatigue  HPI: Pt is a 55F with a PMH sig for HTN, HLD, HFpEF, DM (dx 2011, previously poorly controlled) complicated by neuropathy, and CKD Stage III who is now seen in consultation at the request of Dr. Mingo Amber for evaluation and recommendations surrounding newly-diagnosed nephrotic syndrome.    Briefly, pt was seen at the [facility] yesterday to evaluate itching, increased swelling, and fatigue.  These issues seem to be an ongoing problem for her.  Labs were taken which showed a Cr of 1.78 (up from baseline of 1.1-1.3), K 4.6,CO2 21, albumin 2.8, Tchol 252 with LDL 154, and a Hgb of 6.9, Tsat 22%.  In this setting she was advised to come to ED for evaluation.    Since arriving to [facility], she has received 1 u pRBCs.  UP/C was performed which showed ~ 12 g proteinuria.  UA looks dirty.  HIV and Hep C negative.  24 hour urine collection has been ordered by primary team.  In this setting we are asked to see.  Pt reports a h/o swelling that dates back to Feb.  Was placed on HCTZ with little effect and actually had some orthostasis related to it which caused AKI in April and May.  She also noted that her BP became a bit harder to control once the swelling started.  She doesn't consume NSAIDs.  Is not up to date on c-scope, mammogram, or Pap smear.  TSH was WNL 12/17.  She has a pervasive FH of ESRD with her mom, two sisters requiring dialysis.  Father has declined dialysis.  Of note, M-spike of 0.3 noted on SPEP 07/2017.  PMH: Past Medical History:  Diagnosis Date  . Diabetes mellitus without complication ([place])   . Edema of both legs   . Grief 09/08/2017  . Hypertension   . Neuropathy    PSH: Past Surgical History:  Procedure Laterality Date  . BACK SURGERY    . CESAREAN SECTION    . FOOT SURGERY    . SHOULDER SURGERY       Past Medical History:  Diagnosis Date  .  Diabetes mellitus without complication ([place])   . Edema of both legs   . Grief 09/08/2017  . Hypertension   . Neuropathy     Medications:   Scheduled: . sodium chloride   Intravenous Once  . furosemide  40 mg Intravenous Q12H  . insulin aspart  0-5 Units Subcutaneous QHS  . insulin aspart  0-9 Units Subcutaneous TID WC  . sertraline  50 mg Oral QHS  . sodium chloride flush  3 mL Intravenous Q12H  . sodium chloride flush  3 mL Intravenous Q12H    Medications Prior to Admission  Medication Sig Dispense Refill  . acetaminophen (TYLENOL) 500 MG tablet Take 1,000 mg by mouth every 6 (six) hours as needed (for pain or headaches).     . capsaicin (ZOSTRIX) 0.025 % cream Apply topically 2 (two) times daily as needed (pain). 60 g 0  . diphenhydrAMINE (BENADRYL ALLERGY) 25 mg capsule Take 1 capsule (25 mg total) by mouth every 6 (six) hours as needed. 30 capsule 0  . Dulaglutide 1.5 MG/0.5ML SOPN Inject 1.5 mg into the skin once a week. 4 pen 11  . DULoxetine (CYMBALTA) 30 MG capsule Take 1 capsule (30 mg total) by mouth daily. 30 capsule 2  . FLUoxetine (PROZAC)  20 MG capsule Take 20 mg by mouth daily.    . hydrOXYzine (ATARAX/VISTARIL) 25 MG tablet Take 1 tablet (25 mg total) by mouth 3 (three) times daily as needed. 60 tablet 0  . sertraline (ZOLOFT) 50 MG tablet Take 1 tablet (50 mg total) by mouth daily. 30 tablet 0  . bisacodyl (DULCOLAX) 5 MG EC tablet Take 1 tablet (5 mg total) by mouth daily as needed for moderate constipation. (Patient not taking: Reported on 04/05/2018) 30 tablet 0  . clotrimazole (LOTRIMIN) 1 % cream Apply 1 application topically 2 (two) times daily. (Patient not taking: Reported on 04/05/2018) 60 g 0  . Elastic Bandages & Supports (MEDICAL COMPRESSION STOCKINGS) MISC 1 Package by Does not apply route daily. (Patient not taking: Reported on 04/05/2018) 1 each 0  . polyvinyl alcohol (LIQUIFILM TEARS) 1.4 % ophthalmic solution Place 2 drops into both eyes 4 (four) times  daily. (Patient not taking: Reported on 04/05/2018) 15 mL 0    ALLERGIES:   Allergies  Allergen Reactions  . Atorvastatin Other (See Comments)    Suicidal thoughts  . Statins Other (See Comments)    This class of meds causes "suicidal thoughts"  . Amitriptyline Other (See Comments)    Depression  . Metformin And Related Diarrhea  . Adhesive [Tape] Other (See Comments)    "Sometimes peels off my skin"  . Gabapentin Other (See Comments)    "Gives me the shakes"    FAM HX: Family History  Problem Relation Age of Onset  . Kidney failure Mother   . Diabetes Mother   . Thyroid disease Mother   . Hypertension Mother   . Heart failure Mother   . Kidney failure Father   . Cancer Sister   . Stroke Brother   . Cancer Other     Social History:   reports that she has been smoking cigarettes. She started smoking about 40 years ago. She has a 2.00 pack-year smoking history. She has never used smokeless tobacco. She reports that she does not drink alcohol or use drugs.  ROS: ROS: all other systems reviewed and are negative except as per HPI  Blood pressure (!) 159/77, pulse 79, temperature (!) 97.5 F (36.4 C), temperature source Oral, resp. rate 20, height 5\' 4"  (1.626 m), weight 62.1 kg, SpO2 99 %. PHYSICAL EXAM: Physical Exam  GEN older woman, NAD, lying in bed HEENT EOMI PERRL MMM NECK + JVD to angle of mandible PULM clear bilaterally no c/w/r CV RRR soft systolic murmur LUSB no r/g ABD soft, slightly distended, liver edge palpable 3 cm below costal margin EXT 2+ LE edema to mid-thigh NEURO AAO x 3 nonfocal, no asterixis SKIN: no frank rashes, some occasional excoriations where she's been scratching   Results for orders placed or performed during the hospital encounter of 04/05/18 (from the past 48 hour(s))  Comprehensive metabolic panel     Status: Abnormal   Collection Time: 04/05/18  5:02 PM  Result Value Ref Range   Sodium 143 135 - 145 mmol/L   Potassium 4.2 3.5 -  5.1 mmol/L   Chloride 114 (H) 98 - 111 mmol/L   CO2 23 22 - 32 mmol/L   Glucose, Bld 133 (H) 70 - 99 mg/dL   BUN 31 (H) 6 - 20 mg/dL   Creatinine, Ser 1.60 (H) 0.44 - 1.00 mg/dL   Calcium 8.3 (L) 8.9 - 10.3 mg/dL   Total Protein 6.2 (L) 6.5 - 8.1 g/dL   Albumin 2.4 (L) 3.5 -  5.0 g/dL   AST 19 15 - 41 U/L   ALT 9 0 - 44 U/L   Alkaline Phosphatase 69 38 - 126 U/L   Total Bilirubin 0.6 0.3 - 1.2 mg/dL   GFR calc non Af Amer 35 (L) >60 mL/min   GFR calc Af Amer 41 (L) >60 mL/min   Anion gap 6 5 - 15    Comment: Performed at [facility] [address] [place], [place] [address]  CBC with Differential     Status: Abnormal   Collection Time: 04/05/18  5:02 PM  Result Value Ref Range   WBC 10.0 4.0 - 10.5 K/uL   RBC 2.57 (L) 3.87 - 5.11 MIL/uL   Hemoglobin 7.0 (L) 12.0 - 15.0 g/dL   HCT 23.8 (L) 36.0 - 46.0 %   MCV 92.6 80.0 - 100.0 fL   MCH 27.2 26.0 - 34.0 pg   MCHC 29.4 (L) 30.0 - 36.0 g/dL   RDW 16.3 (H) 11.5 - 15.5 %   Platelets 212 150 - 400 K/uL   nRBC 0.0 0.0 - 0.2 %   Neutrophils Relative % 54 %   Neutro Abs 5.4 1.7 - 7.7 K/uL   Lymphocytes Relative 37 %   Lymphs Abs 3.7 0.7 - 4.0 K/uL   Monocytes Relative 5 %   Monocytes Absolute 0.5 0.1 - 1.0 K/uL   Eosinophils Relative 3 %   Eosinophils Absolute 0.3 0.0 - 0.5 K/uL   Basophils Relative 1 %   Basophils Absolute 0.1 0.0 - 0.1 K/uL   Immature Granulocytes 0 %   Abs Immature Granulocytes 0.02 0.00 - 0.07 K/uL    Comment: Performed at [facility], [place] [address] [place], [place] [address]  Type and screen [place]     Status: None   Collection Time: 04/05/18  5:02 PM  Result Value Ref Range   ABO/RH(D) AB POS    Antibody Screen NEG    Sample Expiration 04/08/2018    Unit Number [id number]    Blood Component Type RED CELLS,LR    Unit division 00    Status of Unit ISSUED,FINAL    Transfusion Status OK TO TRANSFUSE    Crossmatch Result      Compatible Performed at [facility], [place] [address], [place], [place] [address]   Brain natriuretic peptide     Status: None   Collection Time: 04/05/18  5:02 PM  Result Value Ref Range   B Natriuretic Peptide 90.4 0.0 - 100.0 pg/mL    Comment: Performed at [facility] [address] [place], [place] [address]  Iron and TIBC     Status: None   Collection Time: 04/05/18  5:02 PM  Result Value Ref Range   Iron 62 28 - 170 ug/dL   TIBC 280 250 - 450 ug/dL   Saturation Ratios 22 10.4 - 31.8 %   UIBC 218 ug/dL    Comment: Performed at [facility], [place] [address], [place], [place] [address]  Ferritin     Status: Abnormal   Collection Time: 04/05/18  5:02 PM  Result Value Ref Range   Ferritin 328 (H) 11 - 307 ng/mL    Comment: Performed at [facility], [place] [address] [place], [place] [address]  Prepare RBC     Status: None   Collection Time: 04/05/18  5:02 PM  Result Value Ref Range   Order Confirmation  ORDER PROCESSED BY BLOOD BANK Performed at [facility], [place] [address] [place], [place] [address]   ABO/Rh     Status: None   Collection Time: 04/05/18  5:02 PM  Result Value Ref Range   ABO/RH(D)      AB POS Performed at [facility] [address] [place], [place] [address]   POC occult blood, ED     Status: None   Collection Time: 04/05/18  5:17 PM  Result Value Ref Range   Fecal Occult Bld NEGATIVE NEGATIVE  Glucose, capillary     Status: Abnormal   Collection Time: 04/05/18 11:03 PM  Result Value Ref Range   Glucose-Capillary 167 (H) 70 - 99 mg/dL  CBC with Differential/Platelet     Status: Abnormal   Collection Time: 04/06/18  6:26 AM  Result Value Ref Range   WBC 9.8 4.0 - 10.5 K/uL   RBC 2.71 (L) 3.87 - 5.11 MIL/uL   Hemoglobin 7.6 (L) 12.0 - 15.0 g/dL   HCT 24.5 (L) 36.0 - 46.0 %   MCV 90.4 80.0 - 100.0 fL   MCH 28.0 26.0 - 34.0 pg   MCHC 31.0 30.0 - 36.0 g/dL   RDW 15.8 (H) 11.5 - 15.5 %   Platelets 175 150 - 400 K/uL   nRBC 0.0 0.0 - 0.2 %    Neutrophils Relative % 50 %   Neutro Abs 4.9 1.7 - 7.7 K/uL   Lymphocytes Relative 40 %   Lymphs Abs 4.0 0.7 - 4.0 K/uL   Monocytes Relative 6 %   Monocytes Absolute 0.6 0.1 - 1.0 K/uL   Eosinophils Relative 3 %   Eosinophils Absolute 0.3 0.0 - 0.5 K/uL   Basophils Relative 1 %   Basophils Absolute 0.1 0.0 - 0.1 K/uL   Immature Granulocytes 0 %   Abs Immature Granulocytes 0.04 0.00 - 0.07 K/uL    Comment: Performed at [facility], [place] [address] [place], [place] [address]  Basic metabolic panel     Status: Abnormal   Collection Time: 04/06/18  6:26 AM  Result Value Ref Range   Sodium 141 135 - 145 mmol/L   Potassium 4.4 3.5 - 5.1 mmol/L   Chloride 113 (H) 98 - 111 mmol/L   CO2 21 (L) 22 - 32 mmol/L   Glucose, Bld 110 (H) 70 - 99 mg/dL   BUN 33 (H) 6 - 20 mg/dL   Creatinine, Ser 1.76 (H) 0.44 - 1.00 mg/dL   Calcium 8.0 (L) 8.9 - 10.3 mg/dL   GFR calc non Af Amer 31 (L) >60 mL/min   GFR calc Af Amer 36 (L) >60 mL/min   Anion gap 7 5 - 15    Comment: Performed at [facility] [address] [place], [place] [address]  Urinalysis, Routine w reflex microscopic     Status: Abnormal   Collection Time: 04/06/18  7:03 AM  Result Value Ref Range   Color, Urine YELLOW YELLOW   APPearance CLOUDY (A) CLEAR   Specific Gravity, Urine 1.015 1.005 - 1.030   pH 8.0 5.0 - 8.0   Glucose, UA 150 (A) NEGATIVE mg/dL   Hgb urine dipstick NEGATIVE NEGATIVE   Bilirubin Urine NEGATIVE NEGATIVE   Ketones, ur NEGATIVE NEGATIVE mg/dL   Protein, ur >=300 (A) NEGATIVE mg/dL   Nitrite POSITIVE (A) NEGATIVE   Leukocytes, UA TRACE (A) NEGATIVE   RBC / HPF 0-5 0 - 5 RBC/hpf   WBC, UA >50 (  H) 0 - 5 WBC/hpf   Bacteria, UA MANY (A) NONE SEEN   Squamous Epithelial / LPF 6-10 0 - 5   Mucus PRESENT    Triple Phosphate Crystal PRESENT     Comment: Performed at [facility], [place] [address] [place], [place] [address]  Glucose, capillary     Status: Abnormal   Collection Time: 04/06/18   7:46 AM  Result Value Ref Range   Glucose-Capillary 120 (H) 70 - 99 mg/dL  Glucose, capillary     Status: Abnormal   Collection Time: 04/06/18 11:39 AM  Result Value Ref Range   Glucose-Capillary 125 (H) 70 - 99 mg/dL    No results found.  Assessment/Plan  1.  Nephrotic syndrome: Pt with a several month history of increasing edema, higher BP s, hypercholesterolemia with markedly increased LDL, hypoalbuminemia.  Cr up from baseline of 1.1-1.3 to 1.78.  Considerations include membranous, FSGS (given FH of ESRD), possible minimal change, possible myeloma/amyloid.  Could also be exceptionally bad DN but has had DM since 2011 and seems a little quick for such a large amount of proteinuria.  [place] in 07/2017 with relatively preserved size of kidneys.  She will need a biopsy.  I have discussed this in detail with her.  She is extremely fearful of dialysis and wants to do everything to prevent it so she is in agreement.  For further workup, I have ordered SPEP and serum free light chains, ANA, C3, and C4 complements.  Biopsy form filled out and is in chart.    2.  Acute on chronic kidney disease: Baseline Cr 1.1-1.3, AKI likely due to relative hypoxia with Hgb 6.9 + underlying nephrotic syndrome.  Plan as above.  3.  HTN/Volume: will diurese with IV Lasix   4.  Anemia: s/p 1 u pRBCs.  SPEP with + M-spike in April.  Hasn't had a colonoscopy.  Labs not indicative of hemolysis.  Tsat 22%.  Per primary  5.  HFpEF- TTE performed and is pending  Madelon Lips 04/06/2018, 3:42 PM

## 2018-04-06 NOTE — Progress Notes (Signed)
Family Medicine Teaching Service Daily Progress Note Intern Pager: [phone]  Patient name: Andrea Glenn Medical record number: [id number] Date of birth: [birth year] Age: 57 y.o. Gender: female  Primary Care Provider: [place] Bing, DO Consultants: none Code Status: full  Pt Overview and Major Events to Date:  12/18 admit for anemia workup  Assessment and Plan: Andrea Glenn is a 57 y.o. female presenting with a two day history of increasing fatigue found to have Hgb of 6.9 in clinic yesterday . PMH is significant for chronic anemia, CKD, HFpEF, DM-II w/ peripheral neuropathy, depression, HLD, HTN, pruritus.   Symptomatic normocytic anemia  concern for renal disease, cardiorenal disease syndrome, cancer  -transfused 1 unit of RBCs in the ED following a hemoglobin of 6.9. on admission hemoglobin 6.9, MCV 86, RDW 14.5, reticulocyte count percent 3.7. Posttransfusion Hgb 7.6.  Iron panel shows iron: 62, TIBC: 280, ferritin 328.  Fecal occult blood negative. -Transfuse for hemoglobin under 7 -renal consult -PT/OT  AKI on CKD - BL 1.0-1.3. Cr 1.6 on admission, Cr 1.76 (12/19), BUN 33 appears fluid overloaded on physical exam (LE edema, crackles on lung auscultation).  Likely an intrarenal issue. -Urine sodium/creatinine pending -f/u Cr -nephrology consult  Nephrotic syndrome - patient had Protein: Creatinine ratio was 12,519 yesterday.  Patient also has low albumin (2.4) and elevated cholesterol (252).  On exam she has pitting edema bilaterally in her legs. Differential diagnosis includes the normal causes of nephrotic syndrome ( IgA nephropathy, FSGS, membranous nephropathy). - nephrology consult - consider 24hr urine protein  Dysuria - patient complains of dysuria over the past few days.  - f/u urinalysis.    HFpEF - BNP was 90.4 (04/05/2018), previous 174 (06/2017).  most recent echo in February showed 60-65% EF, no wall motion abnormalities, normal systolic function, Grade  2 diastolic dysfuntion.  PA pressure 71mmHg.  Fluid overload exam may be secondary to nephrotic syndrome. - f/u TTE  Non-insulin dependant Diabetes mellitus II w/ peripheral neuropathy - Last A1c: 6.0 12/17.  patient takes dulaglutide 1.5mg  qWeekly.  CBG 110-160 since admission. - sliding scale insulin w/ meals and HS - CBG w/ meals and nightly     Depression  - continue zoloft 50mg   HLD - lipid panel today shows cholesterol 252, LDL 47,  TAG 234.  LDL has been consistently elevated for 4 years. Patient has allergy for statins indicating 'suicidal thoughts'.  Elevated levels of cholesterol could be result of nephrotic syndrome.  - do not start statin.    HTN - patient has been prescribed HCTZ for HTN although it is not listed among her home meds.  Today her SBP hs been 130-160. Amlodipine was stopped in response to leg swelling.    SB 130-160, DBP 62-80 in the past 24 hours. - consider restarting HCTZ.  Pruritus  - benadryl 25mg  q6h PRN for itch - capsaicin cream BID PRN  FEN/GI: carb modified diet.  Prophylaxis: lovenox  Disposition: telemetry, inpatient.   Subjective:  Her biggest concern this morning was discomfort in her legs in addition to concern about her kidneys.  She mentioned multiple family members who have experienced ESRD and needed dialysis in the past.  She recounted multiple people who experience significantly suffering before ultimately passing away and ways that she connected with dialysis.    Objective: Temp:  [97.5 F (36.4 C)-98.5 F (36.9 C)] 98.5 F (36.9 C) (12/19 0324) Pulse Rate:  [80-88] 82 (12/19 0324) Resp:  [15-18] 18 (12/19 0324) BP: (133-168)/(51-99) 150/76 (  12/19 0324) SpO2:  [98 %-100 %] 98 % (12/19 0324) Weight:  [62.1 kg] 62.1 kg (12/18 1649)  Physical Exam: General: Alert and cooperative and appears to be in no acute distress HEENT: Neck non-tender without lymphadenopathy, masses or thyromegaly Cardio: Normal A1 and S2, no S3 or  S4. Rhythm is regular. No murmurs or rubs.   Pulm: Breathing comfortably on room air.  Rales noted in all 4 quadrants.  No wheezing. Abdomen: Bowel sounds normal. Abdomen soft and non-tender.  Extremities: 2+ pitting edema extending up to the mid thigh. Neuro: Cranial nerves grossly intact   Laboratory: Recent Labs  Lab 04/04/18 1613 04/04/18 1628 04/05/18 1702  WBC 9.9 10.0 10.0  HGB 6.9* 7.1* 7.0*  HCT 20.9* 22.0* 23.8*  PLT 209 217 212   Recent Labs  Lab 04/04/18 1613 04/05/18 1702  NA 144 143  K 4.6 4.2  CL 109* 114*  CO2 21 23  BUN 34* 31*  CREATININE 1.78* 1.60*  CALCIUM 8.5* 8.3*  PROT 5.8* 6.2*  BILITOT 0.2 0.6  ALKPHOS 90 69  ALT 6 9  AST 19 19  GLUCOSE 140* 133*    Imaging/Diagnostic Tests: No results found.   Matilde Haymaker, MD 04/06/2018, 6:34 AM PGY-1, [facility] Intern pager: [phone], text pages welcome

## 2018-04-06 NOTE — Progress Notes (Signed)
  Echocardiogram 2D Echocardiogram has been performed.  Jannett Celestine 04/06/2018, 9:09 AM

## 2018-04-07 ENCOUNTER — Other Ambulatory Visit: Payer: Self-pay

## 2018-04-07 ENCOUNTER — Inpatient Hospital Stay (HOSPITAL_COMMUNITY): Payer: Medicaid Other

## 2018-04-07 LAB — BASIC METABOLIC PANEL
Anion gap: 6 (ref 5–15)
BUN: 32 mg/dL — AB (ref 6–20)
CO2: 24 mmol/L (ref 22–32)
Calcium: 8.6 mg/dL — ABNORMAL LOW (ref 8.9–10.3)
Chloride: 114 mmol/L — ABNORMAL HIGH (ref 98–111)
Creatinine, Ser: 1.83 mg/dL — ABNORMAL HIGH (ref 0.44–1.00)
GFR calc Af Amer: 35 mL/min — ABNORMAL LOW (ref >60)
GFR calc non Af Amer: 30 mL/min — ABNORMAL LOW (ref >60)
Glucose, Bld: 104 mg/dL — ABNORMAL HIGH (ref 70–99)
POTASSIUM: 4.6 mmol/L (ref 3.5–5.1)
Sodium: 144 mmol/L (ref 135–145)

## 2018-04-07 LAB — CBC
HCT: 26.7 % — ABNORMAL LOW (ref 36.0–46.0)
Hemoglobin: 8.2 g/dL — ABNORMAL LOW (ref 12.0–15.0)
MCH: 27.8 pg (ref 26.0–34.0)
MCHC: 30.7 g/dL (ref 30.0–36.0)
MCV: 90.5 fL (ref 80.0–100.0)
Platelets: 183 10*3/uL (ref 150–400)
RBC: 2.95 MIL/uL — ABNORMAL LOW (ref 3.87–5.11)
RDW: 15.7 % — ABNORMAL HIGH (ref 11.5–15.5)
WBC: 11.1 10*3/uL — ABNORMAL HIGH (ref 4.0–10.5)
nRBC: 0 % (ref 0.0–0.2)

## 2018-04-07 LAB — GLUCOSE, CAPILLARY
GLUCOSE-CAPILLARY: 88 mg/dL (ref 70–99)
Glucose-Capillary: 111 mg/dL — ABNORMAL HIGH (ref 70–99)
Glucose-Capillary: 87 mg/dL (ref 70–99)
Glucose-Capillary: 230 mg/dL — ABNORMAL HIGH (ref 70–99)
Glucose-Capillary: 135 mg/dL — ABNORMAL HIGH (ref 70–99)

## 2018-04-07 LAB — PROTIME-INR
INR: 0.97
Prothrombin Time: 12.8 seconds (ref 11.4–15.2)

## 2018-04-07 MED ORDER — SODIUM CHLORIDE 0.9 % IV SOLN
INTRAVENOUS | Status: AC | PRN
  Administered 2018-04-07: 10 mL/h via INTRAVENOUS

## 2018-04-07 MED ORDER — CEPHALEXIN 500 MG PO CAPS
500.0000 mg | ORAL_CAPSULE | Freq: Two times a day (BID) | ORAL | Status: DC
  Administered 2018-04-07 – 2018-04-10 (×7): 500 mg via ORAL
  Filled 2018-04-07 (×7): qty 1

## 2018-04-07 MED ORDER — MIDAZOLAM HCL 2 MG/2ML IJ SOLN
INTRAMUSCULAR | Status: AC
  Filled 2018-04-07: qty 2

## 2018-04-07 MED ORDER — MIDAZOLAM HCL 2 MG/2ML IJ SOLN
INTRAMUSCULAR | Status: AC | PRN
  Administered 2018-04-07: 1 mg via INTRAVENOUS

## 2018-04-07 MED ORDER — NITROFURANTOIN MONOHYD MACRO 100 MG PO CAPS: 100.0000 mg | ORAL_CAPSULE | Freq: Two times a day (BID) | ORAL | Status: DC

## 2018-04-07 MED ORDER — LIDOCAINE HCL (PF) 1 % IJ SOLN
INTRAMUSCULAR | Status: AC
  Filled 2018-04-07: qty 30

## 2018-04-07 MED ORDER — GELATIN ABSORBABLE 12-7 MM EX MISC
CUTANEOUS | Status: AC
  Filled 2018-04-07: qty 1

## 2018-04-07 MED ORDER — FENTANYL CITRATE (PF) 100 MCG/2ML IJ SOLN
INTRAMUSCULAR | Status: AC
  Filled 2018-04-07: qty 2

## 2018-04-07 MED ORDER — FENTANYL CITRATE (PF) 100 MCG/2ML IJ SOLN
INTRAMUSCULAR | Status: AC | PRN
  Administered 2018-04-07: 50 ug via INTRAVENOUS

## 2018-04-07 NOTE — Procedures (Signed)
Interventional Radiology Procedure Note  Procedure: US Guided Biopsy of right kidney  Complications: None  Estimated Blood Loss: < 10 mL  Findings: 16 G core biopsy of right LP renal cortex performed under US guidance.  2 core samples obtained and sent to Pathology.  Venetia Night. Kathlene Cote, M.D Pager:  [phone]

## 2018-04-07 NOTE — Discharge Summary (Signed)
[facility] Discharge Summary  Patient name: Andrea Glenn Medical record number: [id number] Date of birth: 1960-08-01 Age: 57 y.o. Gender: female Date of Admission: 04/05/2018  Date of Discharge: 04/10/18 Admitting Physician: Alveda Reasons, MD  Primary Care Provider: [place] Bing, DO Consultants: Nephrology    Indication for Hospitalization: Symptomatic anemia requiring transfusion  Discharge Diagnoses/Problem List:  Symptomatic normocytic anemia Nephrotic AKI on CKD Uncomplicated UTI Heart failure with preserved ejection fraction Type 2 diabetes with peripheral neuropathy Depression  hyperlipidemia Pruritus  Disposition: home   Discharge Condition: stable, improved    Discharge Exam:  General: 57 y/o female resting comfortably in bed. No acute distress.  Cardiovascular: RRR no MRG  Lungs: CTAB, no increased work of breathing Abdomen: soft, non-tender, non-distended, normoactive bowel sounds Skin: warm, dry, cap refill < 2 seconds, biopsy site intact without erythema or discharge  Extremities: warm and well perfused, normal tone, trace edema up to knees bilaterally   Brief Hospital Course:  [name] is a 57 year old woman who was encouraged to go to the hospital following outpatient lab work revealing a hemoglobin of 6.9.  She was admitted on 12/18.  On 12/18, she was transfused 1 unit of RBCs with a post transfusion hemoglobin of 7.6.  She is also found to have a significant creatinine elevation in the setting of significant proteinuria and volume overload.  She was also started on Keflex for Nephrology was consulted to help address her nephrotic syndrome.  On 12/20, kidney biopsy was performed without complication and labs were obtained for further workup.   Per Nephrology, may follow up results of renal biopsy at [facility] and patient informed.   At time of discharge home, her hemoglobin was stable at 8.7 and symptoms had  largely resolved.  Appointment for follow-up scheduled at PCP office.   Issues for Follow Up:  1. ASCVD DE 10-year risk 25%.  Patient would benefit from a high intensity statin.  Not started in hospital due to noted allergy.  Please consider statin initiation outpatient.   2. F/u blood pressures - elevated to 160 sbp prior to discharge.  Per patient, her home amlodipine had been held 2/2 orthostatic hypotension.  She states it is normally 120/80. Have held on discharge and PCP to recheck at follow up, resume if appropriate.   Significant Procedures:  Ultrasound-guided renal biopsy 12/20   Significant Labs and Imaging:  Recent Labs  Lab 04/08/18 0530 04/09/18 0418 04/10/18 0442  WBC 12.0* 11.6* 12.1*  HGB 8.0* 8.4* 8.7*  HCT 25.4* 26.8* 28.1*  PLT 187 188 189   Recent Labs  Lab 04/04/18 1613  04/05/18 1702 04/06/18 0626 04/07/18 0710 04/08/18 0530 04/09/18 0418 04/10/18 0442  NA 144   < > 143 141 144 138 138 141  K 4.6  --  4.2 4.4 4.6 4.2 4.4 4.3  CL 109*  --  114* 113* 114* 107 106 107  CO2 21  --  23 21* 24 23 24 25   GLUCOSE 140*   < > 133* 110* 104* 234* 144* 132*  BUN 34*   < > 31* 33* 32* 38* 38* 42*  CREATININE 1.78*  --  1.60* 1.76* 1.83* 1.97* 1.98* 2.06*  CALCIUM 8.5*  --  8.3* 8.0* 8.6* 8.1* 8.5* 8.6*  PHOS 4.1  --   --   --   --   --   --  3.7  ALKPHOS 90  --  69  --   --   --   --   --  AST 19  --  19  --   --   --   --   --   ALT 6  --  9  --   --   --   --   --   ALBUMIN 2.8*  --  2.4*  --   --   --   --  2.0*   < > = values in this interval not displayed.   Results/Tests Pending at Time of Discharge:  SPEP Renal biopsy  Discharge Medications:  Allergies as of 04/10/2018      Reactions   Atorvastatin Other (See Comments)   Suicidal thoughts   Statins Other (See Comments)   This class of meds causes "suicidal thoughts"   Amitriptyline Other (See Comments)   Depression   Metformin And Related Diarrhea   Adhesive [tape] Other (See Comments)    "Sometimes peels off my skin"   Gabapentin Other (See Comments)   "Gives me the shakes"      Medication List    TAKE these medications   acetaminophen 500 MG tablet Commonly known as:  TYLENOL Take 1,000 mg by mouth every 6 (six) hours as needed (for pain or headaches).   bisacodyl 5 MG EC tablet Commonly known as:  DULCOLAX Take 1 tablet (5 mg total) by mouth daily as needed for moderate constipation.   capsaicin 0.025 % cream Commonly known as:  ZOSTRIX Apply topically 2 (two) times daily as needed (pain).   cephALEXin 500 MG capsule Commonly known as:  KEFLEX Take 1 capsule (500 mg total) by mouth every 12 (twelve) hours for 3 days.   clotrimazole 1 % cream Commonly known as:  LOTRIMIN Apply 1 application topically 2 (two) times daily.   diphenhydrAMINE 25 mg capsule Commonly known as:  BENADRYL ALLERGY Take 1 capsule (25 mg total) by mouth every 6 (six) hours as needed.   Dulaglutide 1.5 MG/0.5ML Sopn Inject 1.5 mg into the skin once a week.   DULoxetine 30 MG capsule Commonly known as:  CYMBALTA Take 1 capsule (30 mg total) by mouth daily.   FLUoxetine 20 MG capsule Commonly known as:  PROZAC Take 20 mg by mouth daily.   furosemide 40 MG tablet Commonly known as:  LASIX Take 1 tablet (40 mg total) by mouth 2 (two) times daily.   hydrOXYzine 25 MG tablet Commonly known as:  ATARAX/VISTARIL Take 1 tablet (25 mg total) by mouth 3 (three) times daily as needed.   Medical Compression Stockings Misc 1 Package by Does not apply route daily.   polyvinyl alcohol 1.4 % ophthalmic solution Commonly known as:  LIQUIFILM TEARS Place 2 drops into both eyes 4 (four) times daily.   sertraline 50 MG tablet Commonly known as:  ZOLOFT Take 1 tablet (50 mg total) by mouth daily.      Discharge Instructions: Please refer to Patient Instructions section of EMR for full details.  Patient was counseled important signs and symptoms that should prompt return to medical  care, changes in medications, dietary instructions, activity restrictions, and follow up appointments.   Follow-Up Appointments: Follow-up [place]. Go on 04/13/2018.   Specialty:  Family Medicine Why:  Appointment at 8:50 AM.  Please arrive 15 min prior to your scheduled time.  Contact information: [address] [phone] [place]         Lovenia Kim, MD 04/10/2018, 12:21 PM PGY-3, [place]

## 2018-04-07 NOTE — Progress Notes (Addendum)
Family Medicine Teaching Service Daily Progress Note Intern Pager: [phone]  Patient name: Andrea Glenn Medical record number: [id number] Date of birth: 1960-07-30 Age: 57 y.o. Gender: female  Primary Care Provider: [place] Bing, DO Consultants: none Code Status: full  Pt Overview and Major Events to Date:  12/18 admit for anemia workup  Assessment and Plan: Andrea Glenn is a 57 y.o. female presenting with a two day history of increasing fatigue found to have Hgb of 6.9 in clinic yesterday . PMH is significant for chronic anemia, CKD, HFpEF, DM-II w/ peripheral neuropathy, depression, HLD, HTN, pruritus.   Symptomatic normocytic anemia  concern for renal disease, cardiorenal disease syndrome, cancer.  Admission labs: hemoglobin 6.9, MCV 86, RDW 14.5, reticulocyte count percent 3.7. Posttransfusion Hgb 7.6.  Iron panel shows iron: 62, TIBC: 280, ferritin 328.  Fecal occult blood negative. -Transfuse for hemoglobin under 7 -PT/OT  Nephrotic syndrome - patient had Protein: Creatinine ratio was 12,519 yesterday.  Patient also has low albumin (2.4) and elevated cholesterol (252).  Nephrology consulted on 12/19 recommending renal biopsy and continued work-up for glomerular nephropathy use.  Multiple etiologies and differential currently. -Nephrology following, appreciate recommendations -renal biopsy  AKI on CKD - BL 1.0-1.3. Cr 1.6 on admission, Cr 1.76 (12/19), BUN 33 appears fluid overloaded on physical exam (LE edema, crackles on lung auscultation).  Likely an intrarenal issue. -Urine sodium/creatinine pending -f/u Cr -nephrology consult  Uncomplicated UTI-UA shows ketones with positive leukocyte.  Patient complaining of dysuria.  Past cultures with nitrofurantoin sensitive E. coli. -Keflex 5 days  HFpEF -TTE performed 12/19 shows no change from previous on 06/15/2017.  BNP on admission is lower than previous.  Low suspicion that heart failure is contributing to current  fluid overload picture -Continue to monitor physical exam  Non-insulin dependant Diabetes mellitus II w/ peripheral neuropathy - Last A1c: 6.0 12/17.  patient takes dulaglutide 1.5mg  qWeekly.  CBG 87-180 (12/20). - sliding scale insulin w/ meals and HS - CBG w/ meals and nightly     Depression  - continue zoloft 50mg   HLD - lipid panel today shows cholesterol 252, LDL 47,  TAG 234.  LDL has been consistently elevated for 4 years. ASCVD 25.6% 10 year risk (high risk). - start high intensity statin - Atorvastatin 40 mg  HTN -  Amlodipine was stopped in response to leg swelling.    SB 140-170, DBP 70s in the past 24 hours(12/20). - consider restarting HCTZ.  Pruritus  - benadryl 25mg  q6h PRN for itch - capsaicin cream BID PRN  FEN/GI: carb modified diet.  Prophylaxis: lovenox  Disposition:  DC home pending work-up for anemia/renal disease  Subjective:  No new complaints this morning.  Denies chest pain/stomach pain.  Reports that the swelling in her legs has been decreasing.  She had a good conversation with nephrology and is aware that we want to take a renal biopsy.  Objective: Temp:  [97.5 F (36.4 C)-98.6 F (37 C)] 98.2 F (36.8 C) (12/20 0337) Pulse Rate:  [78-84] 78 (12/20 0337) Resp:  [18-20] 20 (12/20 0337) BP: (145-174)/(71-77) 174/77 (12/20 0337) SpO2:  [95 %-99 %] 97 % (12/20 0337) Weight:  [60.1 kg] 60.1 kg (12/19 2016)  Physical Exam: General: Alert and cooperative and appears to be in no acute distress Cardio: Normal A1 and S2, no S3 or S4. Rhythm is regular. 2/6 systolic murmur.  Pulm: Diffuse rales on auscultation.  No wheezing, stridor.. Normal respiratory effort on room air Abdomen: Bowel sounds normal.  Abdomen soft and non-tender.  Extremities: 1+ LE edema. Warm/ well perfused.  Strong radial pulse. Neuro: Cranial nerves grossly intact  Laboratory: Recent Labs  Lab 04/04/18 1628 04/05/18 1702 04/06/18 0626  WBC 10.0 10.0 9.8  HGB 7.1*  7.0* 7.6*  HCT 22.0* 23.8* 24.5*  PLT 217 212 175   Recent Labs  Lab 04/04/18 1613 04/05/18 1702 04/06/18 0626  NA 144 143 141  K 4.6 4.2 4.4  CL 109* 114* 113*  CO2 21 23 21*  BUN 34* 31* 33*  CREATININE 1.78* 1.60* 1.76*  CALCIUM 8.5* 8.3* 8.0*  PROT 5.8* 6.2*  --   BILITOT 0.2 0.6  --   ALKPHOS 90 69  --   ALT 6 9  --   AST 19 19  --   GLUCOSE 140* 133* 110*    Imaging/Diagnostic Tests: No results found.   Matilde Haymaker, MD 04/07/2018, 6:38 AM PGY-1, [place] Intern pager: [phone], text pages welcome

## 2018-04-07 NOTE — Progress Notes (Signed)
Family Medicine Teaching Service Daily Progress Note Intern Pager: [phone]  Patient name: Andrea Glenn Medical record number: [id number] Date of birth: Dec 29, 1960 Age: 57 y.o. Gender: female  Primary Care Provider: [place] Bing, DO Consultants: nephrology Code Status: full  Pt Overview and Major Events to Date:  12/18 admit for anemia workup 12/20 renal biopsy of right kidney obtained  Assessment and Plan: Andrea Glenn is a 57 y.o. female presenting with a two day history of increasing fatigue found to have Hgb of 6.9 in clinic yesterday . PMH is significant for chronic anemia, CKD, HFpEF, DM-II w/ peripheral neuropathy, depression, HLD, HTN, pruritus.   Symptomatic normocytic anemia:  Acute on chronic.  Hgb 6.9 on admission with MCV 86 and RDW 14.  5, retic ct 3.7%.  S/p x1 pRBC with posttransfusion hgb 7.6.  Transfusion threshold remains 7.0.  Iron panel supports any of chronic inflammation which may be related to underlying renal disease.  Hemolysis less likely due to no increase in bilirubin.  SPEP in April with M spike.  No history of colorectal cancer screening with negative FOBT.  Malignancy still remains a possibility.  Pending extensive renal work-up. - Nephrology consulted, appreciate recommendations - Avoid blood thinners - Will ultimately need colonoscopy to rule out colorectal cancer - PT/OT consulted, appreciate recommendations - Telemetry  AKI on CKDIIIa  Nephrotic syndrome: Creatinine remains largely unchanged at 1.97.  BL 1.0-1.3.  UOP inaccurate due to malfunctioning pure wick.  Patient reported to have good urine output. UP/C elevated 12,519 on admission.  Albumin low at 2.4 with cholesterol at 252.  S/p renal biopsy of right kidney.  Likely contributing comorbidities include uncontrolled diabetes and hypertension.  Differential remains broad including membranous, FSGS, minimal-change, myeloma/amyloid. - Nephrology consulted, appreciate recommendations -  Awaiting pathology results from renal biopsy - Continue trending renal function and avoid nephrotoxic agents - Monitor I&O's  Acute cystitis: UA shows ketones with positive leukocyte.  Patient complaining of dysuria on admission.  Past cultures with nitrofurantoin sensitive E. coli. - Keflex 500 mg BID, day 2 of 7  HFpEF: TTE performed 12/19 shows no change from previous on 06/15/2017.  BNP on admission is lower than previous.  Unlikely HF is contributing to AKI.  Low concern for cardiorenal syndrome. - Continue to monitor I&O's  Non-insulin dependant Diabetes mellitus II w/ peripheral neuropathy:  Last A1c 6.0 12/17.  Patient takes dulaglutide 1.5mg  qWeekly.  CBG been well controlled. - Sliding scale insulin w/ meals and HS - CBG w/ meals and nightly     Depression:  Chronic.  Well-controlled on SSRI. - Continue zoloft 50mg   HLD: Chronic.  On high intensity statin.  Lipid panel today shows cholesterol 252, LDL 47,  TAG 234.  LDL has been consistently elevated for 4 years. ASCVD 25.6% 10 year risk (high risk). - Continue Lipitor 40 mg daily  HTN:   Amlodipine was stopped in response to leg swelling.    Andrea Glenn remain 381-829 and diastolic 93-71. - Continue holding HCTZ but may benefit from restarting in the near future  Pruritus  Chronic.  Had improvement with ivermectin and permethrin cream.  Initially suspicious for scabies.  Still uncertain what the source is. - Continue Benadryl 25mg  q6h PRN for itch - Continue capsaicin cream BID PRN  FEN/GI: carb modified diet Prophylaxis: SCDs s/p renal biopsy 04/07/2018  Disposition:  Anticipate discharge home once work-up for anemia and renal disease is complete.  Anticipate discharge within the next 24-72 hours.  Subjective:  Patient says she is doing well this morning.  She was able to tolerate a chicken salad yesterday evening.  Patient says she is hungry this morning.  She has no complaints and says she is urinating  without difficulty.  She has no pain at the renal biopsy site.  Objective: Temp:  [98.1 F (36.7 C)-98.6 F (37 C)] 98.4 F (36.9 C) (12/21 0400) Pulse Rate:  [70-87] 76 (12/21 0400) Resp:  [11-20] 18 (12/21 0400) BP: (135-193)/(74-90) 168/74 (12/21 0400) SpO2:  [97 %-100 %] 100 % (12/21 0400)  Physical Exam: General: well nourished, well developed, NAD with non-toxic appearance HEENT: normocephalic, atraumatic, moist mucous membranes Cardiovascular: regular rate and rhythm without murmurs, rubs, or gallops Lungs: clear to auscultation bilaterally with normal work of breathing Abdomen: soft, non-tender, non-distended, normoactive bowel sounds GU: clear yellow urine at 250 mL, no suprapubic tenderness Skin: warm, dry, diffuse excoriations and scabs, cap refill < 2 seconds, biopsy site intact without erythema or discharge Extremities: warm and well perfused, normal tone, 1+ pitting edema up to knees bilaterally  Laboratory: Recent Labs  Lab 04/06/18 0626 04/07/18 0710 04/08/18 0530  WBC 9.8 11.1* 12.0*  HGB 7.6* 8.2* 8.0*  HCT 24.5* 26.7* 25.4*  PLT 175 183 187   Recent Labs  Lab 04/04/18 1613  04/05/18 1702 04/06/18 0626 04/07/18 0710 04/08/18 0530  NA 144   < > 143 141 144 138  K 4.6  --  4.2 4.4 4.6 4.2  CL 109*  --  114* 113* 114* 107  CO2 21  --  23 21* 24 23  BUN 34*   < > 31* 33* 32* 38*  CREATININE 1.78*  --  1.60* 1.76* 1.83* 1.97*  CALCIUM 8.5*  --  8.3* 8.0* 8.6* 8.1*  PROT 5.8*  --  6.2*  --   --   --   BILITOT 0.2  --  0.6  --   --   --   ALKPHOS 90  --  69  --   --   --   ALT 6  --  9  --   --   --   AST 19  --  19  --   --   --   GLUCOSE 140*   < > 133* 110* 104* 234*   < > = values in this interval not displayed.    Imaging/Diagnostic Tests: ULTRASOUND GUIDED CORE BIOPSY OF RIGHT KIDNEY (04/07/2018) PROCEDURE: The procedure, risks, benefits, and alternatives were explained to the patient. Questions regarding the procedure were encouraged and  answered. The patient understands and consents to the procedure. Ultrasound was utilized in imaging and localizing both kidneys. The right was chosen for biopsy. The right flank region was prepped with chlorhexidine in a sterile fashion, and a sterile drape was applied covering the operative field. A sterile gown and sterile gloves were used for the procedure. Local anesthesia was provided with 1% Lidocaine. A 15 gauge trocar needle was advanced to the margin of the lower pole cortex of the right kidney. Under ultrasound guidance, 2 separate 16 gauge core biopsy samples were obtained of lower pole cortex. Samples were submitted in saline. Additional ultrasound was performed. COMPLICATIONS: None immediate. FINDINGS: The lower pole of the right kidney was better visualized than the left due to less shadowing from overlying ribs. Solid tissue was obtained with biopsy. There were no immediate complications. IMPRESSION: Ultrasound-guided core biopsy performed of lower pole cortex of the right kidney.    Yisroel Ramming,  [place] Congress, DO 04/08/2018, 6:49 AM PGY-3, [place] Intern pager: [phone], text pages welcome

## 2018-04-07 NOTE — Consult Note (Signed)
Chief Complaint: Patient was seen in consultation today for random renal biopsy Chief Complaint  Patient presents with  . Anemia   at the request of Dr Laurena Bering   Supervising Physician: Aletta Edouard  Patient Status: [facility] - In-pt  History of Present Illness: Andrea Glenn is a 57 y.o. female   HTN; HLD; DM; CKD 3 Nephrotic syndrome Itching; edema; fatigue- weeks to months Proteinuria  Seen and eval by Nephrology Scheduled now for random renal bx  Past Medical History:  Diagnosis Date  . Diabetes mellitus without complication ([place])   . Edema of both legs   . Grief 09/08/2017  . Hypertension   . Neuropathy     Past Surgical History:  Procedure Laterality Date  . BACK SURGERY    . CESAREAN SECTION    . FOOT SURGERY    . SHOULDER SURGERY      Allergies: Atorvastatin; Statins; Amitriptyline; Metformin and related; Adhesive [tape]; and Gabapentin  Medications: Prior to Admission medications   Medication Sig Start Date End Date Taking? Authorizing Provider  acetaminophen (TYLENOL) 500 MG tablet Take 1,000 mg by mouth every 6 (six) hours as needed (for pain or headaches).    Yes [provider]  capsaicin (ZOSTRIX) 0.025 % cream Apply topically 2 (two) times daily as needed (pain). 04/04/18  Yes Martyn Malay, MD  diphenhydrAMINE (BENADRYL ALLERGY) 25 mg capsule Take 1 capsule (25 mg total) by mouth every 6 (six) hours as needed. 03/21/18  Yes Anderson, Chelsey L, DO  Dulaglutide 1.5 MG/0.5ML SOPN Inject 1.5 mg into the skin once a week. 11/02/17  Yes Hensel, Jamal Collin, MD  DULoxetine (CYMBALTA) 30 MG capsule Take 1 capsule (30 mg total) by mouth daily. 11/10/17  Yes Hensel, Jamal Collin, MD  FLUoxetine (PROZAC) 20 MG capsule Take 20 mg by mouth daily.   Yes [provider]  hydrOXYzine (ATARAX/VISTARIL) 25 MG tablet Take 1 tablet (25 mg total) by mouth 3 (three) times daily as needed. 01/16/18  Yes Bland, Scott, DO  sertraline (ZOLOFT) 50 MG tablet  Take 1 tablet (50 mg total) by mouth daily. 03/21/18  Yes Anderson, Chelsey L, DO  bisacodyl (DULCOLAX) 5 MG EC tablet Take 1 tablet (5 mg total) by mouth daily as needed for moderate constipation. Patient not taking: Reported on 04/05/2018 07/25/17   Patrecia Pour, Christean Grief, MD  clotrimazole (LOTRIMIN) 1 % cream Apply 1 application topically 2 (two) times daily. Patient not taking: Reported on 04/05/2018 10/07/17   Tonette Bihari, MD  Elastic Bandages & Supports (MEDICAL COMPRESSION STOCKINGS) [place] 1 Package by Does not apply route daily. Patient not taking: Reported on 04/05/2018 03/21/18   Doristine Mango L, DO  polyvinyl alcohol (LIQUIFILM TEARS) 1.4 % ophthalmic solution Place 2 drops into both eyes 4 (four) times daily. Patient not taking: Reported on 04/05/2018 09/12/17   Lovenia Kim, MD     Family History  Problem Relation Age of Onset  . Kidney failure Mother   . Diabetes Mother   . Thyroid disease Mother   . Hypertension Mother   . Heart failure Mother   . Kidney failure Father   . Cancer Sister   . Stroke Brother   . Cancer Other     Social History   Socioeconomic History  . Marital status: Single    Spouse name: Not on file  . Number of children: Not on file  . Years of education: Not on file  . Highest education level: Not on file  Occupational History  . Not on file  Social Needs  . Financial resource strain: Not on file  . Food insecurity:    Worry: Not on file    Inability: Not on file  . Transportation needs:    Medical: Not on file    Non-medical: Not on file  Tobacco Use  . Smoking status: Current Every Day Smoker    Packs/day: 0.10    Years: 20.00    Pack years: 2.00    Types: Cigarettes    Start date: 60  . Smokeless tobacco: Never Used  . Tobacco comment: working on quitting - 2 per day - 2.26.19  Substance and Sexual Activity  . Alcohol use: No  . Drug use: No  . Sexual activity: Not on file  Lifestyle  . Physical activity:    Days per  week: Not on file    Minutes per session: Not on file  . Stress: Not on file  Relationships  . Social connections:    Talks on phone: Not on file    Gets together: Not on file    Attends religious service: Not on file    Active member of club or organization: Not on file    Attends meetings of clubs or organizations: Not on file    Relationship status: Not on file  Other Topics Concern  . Not on file  Social History Narrative  . Not on file    Review of Systems: A 12 point ROS discussed and pertinent positives are indicated in the HPI above.  All other systems are negative.  Review of Systems  Constitutional: Positive for fatigue. Negative for activity change and fever.  Respiratory: Negative for shortness of breath.   Cardiovascular: Negative for chest pain.  Gastrointestinal: Negative for abdominal pain.  Neurological: Positive for weakness.  Psychiatric/Behavioral: Negative for behavioral problems and confusion.    Vital Signs: BP (!) 174/77 (BP Location: Right Arm)   Pulse 78   Temp 98.2 F (36.8 C) (Oral)   Resp 20   Ht 5\' 4"  (1.626 m)   Wt 132 lb 7.9 oz (60.1 kg)   SpO2 97%   BMI 22.74 kg/m   Physical Exam Vitals signs reviewed.  Cardiovascular:     Rate and Rhythm: Normal rate and regular rhythm.  Pulmonary:     Effort: Pulmonary effort is normal.     Breath sounds: Normal breath sounds.  Abdominal:     General: Bowel sounds are normal.     Palpations: Abdomen is soft.  Musculoskeletal: Normal range of motion.  Skin:    General: Skin is warm and dry.  Neurological:     General: No focal deficit present.     Mental Status: She is oriented to person, place, and time.  Psychiatric:        Mood and Affect: Mood normal.        Behavior: Behavior normal.        Thought Content: Thought content normal.        Judgment: Judgment normal.     Imaging: No results found.  Labs:  CBC: Recent Labs    04/04/18 1628 04/05/18 1702 04/06/18 0626  04/07/18 0710  WBC 10.0 10.0 9.8 11.1*  HGB 7.1* 7.0* 7.6* 8.2*  HCT 22.0* 23.8* 24.5* 26.7*  PLT 217 212 175 183    COAGS: No results for input(s): INR, APTT in the last 8760 hours.  BMP: Recent Labs    03/21/18 0951 04/04/18 1613 04/05/18 1702 04/06/18  0626  NA 142 144 143 141  K 4.7 4.6 4.2 4.4  CL 111* 109* 114* 113*  CO2 20 21 23  21*  GLUCOSE 135* 140* 133* 110*  BUN 26* 34* 31* 33*  CALCIUM 8.7 8.5* 8.3* 8.0*  CREATININE 1.52* 1.78* 1.60* 1.76*  GFRNONAA 38* 31* 35* 31*  GFRAA 43* 36* 41* 36*    LIVER FUNCTION TESTS: Recent Labs    09/09/17 1343 03/21/18 0951 04/04/18 1613 04/05/18 1702  BILITOT 0.5 0.2 0.2 0.6  AST 11* 17 19 19   ALT 6* 5 6 9   ALKPHOS 100 82 90 69  PROT 7.0 5.9* 5.8* 6.2*  ALBUMIN 2.0* 2.9* 2.8* 2.4*    TUMOR MARKERS: No results for input(s): AFPTM, CEA, CA199, CHROMGRNA in the last 8760 hours.  Assessment and Plan:  Nephrotic syndrome Proteinuria Neph requesting random renal bx Risks and benefits discussed with the patient including, but not limited to bleeding, infection, damage to adjacent structures or low yield requiring additional tests.  All of the patient's questions were answered, patient is agreeable to proceed. Consent signed and in chart.   Thank you for this interesting consult.  I greatly enjoyed meeting ADISYN RUSCITTI and look forward to participating in their care.  A copy of this report was sent to the requesting provider on this date.  Electronically Signed: Lavonia Drafts, PA-C 04/07/2018, 7:48 AM   I spent a total of 20 Minutes    in face to face in clinical consultation, greater than 50% of which was counseling/coordinating care for random renal bx

## 2018-04-07 NOTE — Progress Notes (Signed)
Subjective:  S/p biopsy it went well.  No UOP recorded- she says the Purewick had malfunction and that she actually made a lot of urine and swelling is better   - kidney function about the same  Objective Vital signs in last 24 hours: Vitals:   04/07/18 1150 04/07/18 1150 04/07/18 1157 04/07/18 1235  BP: (!) 156/75 (!) 156/75 137/80 (!) 148/76  Pulse: 70 71 72 70  Resp: 11 11 12 18   Temp:      TempSrc:      SpO2: 100% 100% 98% 97%  Weight:      Height:       Weight change: -2.043 kg  Intake/Output Summary (Last 24 hours) at 04/07/2018 1238 Last data filed at 04/07/2018 0900 Gross per 24 hour  Intake 243 ml  Output -  Net 243 ml    Assessment/Plan  1.  Nephrotic syndrome: Pt with a several month history of increasing edema, higher BP s, hypercholesterolemia with markedly increased LDL, hypoalbuminemia.  Cr up from baseline of 1.1-1.3 to 1.78.  Considerations include membranous, FSGS (given FH of ESRD), possible minimal change, possible myeloma/amyloid.  Could also be exceptionally bad DN but has had DM since 2011 and seems a little quick for such a large amount of proteinuria.  [place] in 07/2017 with relatively preserved size of kidneys.  S/P biopsy- given the holiday I think is highly unlikely we will have any results until 12/26.    She remains extremely fearful of dialysis and wants to do everything to prevent.  SPEP and serum free light chains, ANA, C3, and C4 complements still pending.     2.  Acute on chronic kidney disease: Baseline Cr 1.1-1.3, AKI likely due to relative hypoxia with Hgb 6.9 + underlying nephrotic syndrome.    3.  HTN/Volume: will diurese with IV Lasix - clinically seems to have improved- continue IV lasix.  Think BP will come down as volume improves- amlodipine stopped  4.  Anemia: s/p 1 u pRBCs.  SPEP with + M-spike in April.  Hasn't had a colonoscopy.  Labs not indicative of hemolysis.  Tsat 22%.  Per primary  5.  HFpEF- TTE performed - EF 55-60   Phuoc Huy  A Florene Brill    Labs: Basic Metabolic Panel: Recent Labs  Lab 04/04/18 1613 04/05/18 1702 04/06/18 0626 04/07/18 0710  NA 144 143 141 144  K 4.6 4.2 4.4 4.6  CL 109* 114* 113* 114*  CO2 21 23 21* 24  GLUCOSE 140* 133* 110* 104*  BUN 34* 31* 33* 32*  CREATININE 1.78* 1.60* 1.76* 1.83*  CALCIUM 8.5* 8.3* 8.0* 8.6*  PHOS 4.1  --   --   --    Liver Function Tests: Recent Labs  Lab 04/04/18 1613 04/05/18 1702  AST 19 19  ALT 6 9  ALKPHOS 90 69  BILITOT 0.2 0.6  PROT 5.8* 6.2*  ALBUMIN 2.8* 2.4*   No results for input(s): LIPASE, AMYLASE in the last 168 hours. No results for input(s): AMMONIA in the last 168 hours. CBC: Recent Labs  Lab 04/04/18 1613 04/04/18 1628 04/05/18 1702 04/06/18 0626 04/07/18 0710  WBC 9.9 10.0 10.0 9.8 11.1*  NEUTROABS  --  6.3 5.4 4.9  --   HGB 6.9* 7.1* 7.0* 7.6* 8.2*  HCT 20.9* 22.0* 23.8* 24.5* 26.7*  MCV 86 88 92.6 90.4 90.5  PLT 209 217 212 175 183   Cardiac Enzymes: No results for input(s): CKTOTAL, CKMB, CKMBINDEX, TROPONINI in the last 168 hours. CBG:  Recent Labs  Lab 04/06/18 1139 04/06/18 1706 04/06/18 1958 04/07/18 0339 04/07/18 0752  GLUCAP 125* 180* 127* 111* 87    Iron Studies:  Recent Labs    04/05/18 1702  IRON 62  TIBC 280  FERRITIN 328*   Studies/Results: No results found. Medications: Infusions: . sodium chloride      Scheduled Medications: . sodium chloride   Intravenous Once  . cephALEXin  500 mg Oral Q12H  . fentaNYL      . furosemide  40 mg Intravenous Q12H  . insulin aspart  0-5 Units Subcutaneous QHS  . insulin aspart  0-9 Units Subcutaneous TID WC  . lidocaine (PF)      . midazolam      . midazolam      . sertraline  50 mg Oral QHS  . sodium chloride flush  3 mL Intravenous Q12H  . sodium chloride flush  3 mL Intravenous Q12H    have reviewed scheduled and prn medications.  Physical Exam: General: alert, NAD Heart: RRR Lungs: clear Abdomen: soft, non tender Extremities:  pitting edema- she says is better     04/07/2018,12:38 PM  LOS: 2 days

## 2018-04-08 DIAGNOSIS — N049 Nephrotic syndrome with unspecified morphologic changes: Secondary | ICD-10-CM

## 2018-04-08 DIAGNOSIS — R600 Localized edema: Secondary | ICD-10-CM

## 2018-04-08 LAB — GLUCOSE, CAPILLARY
Glucose-Capillary: 206 mg/dL — ABNORMAL HIGH (ref 70–99)
Glucose-Capillary: 100 mg/dL — ABNORMAL HIGH (ref 70–99)
Glucose-Capillary: 188 mg/dL — ABNORMAL HIGH (ref 70–99)
Glucose-Capillary: 97 mg/dL (ref 70–99)

## 2018-04-08 LAB — CBC
HCT: 25.4 % — ABNORMAL LOW (ref 36.0–46.0)
HEMOGLOBIN: 8 g/dL — AB (ref 12.0–15.0)
MCH: 29 pg (ref 26.0–34.0)
MCHC: 31.5 g/dL (ref 30.0–36.0)
MCV: 92 fL (ref 80.0–100.0)
Platelets: 187 10*3/uL (ref 150–400)
RBC: 2.76 MIL/uL — ABNORMAL LOW (ref 3.87–5.11)
RDW: 15.3 % (ref 11.5–15.5)
WBC: 12 10*3/uL — ABNORMAL HIGH (ref 4.0–10.5)
nRBC: 0 % (ref 0.0–0.2)

## 2018-04-08 LAB — BASIC METABOLIC PANEL
Anion gap: 8 (ref 5–15)
BUN: 38 mg/dL — ABNORMAL HIGH (ref 6–20)
CO2: 23 mmol/L (ref 22–32)
Calcium: 8.1 mg/dL — ABNORMAL LOW (ref 8.9–10.3)
Chloride: 107 mmol/L (ref 98–111)
Creatinine, Ser: 1.97 mg/dL — ABNORMAL HIGH (ref 0.44–1.00)
GFR calc Af Amer: 32 mL/min — ABNORMAL LOW (ref >60)
GFR, EST NON AFRICAN AMERICAN: 27 mL/min — AB (ref >60)
GLUCOSE: 234 mg/dL — AB (ref 70–99)
Potassium: 4.2 mmol/L (ref 3.5–5.1)
Sodium: 138 mmol/L (ref 135–145)

## 2018-04-08 LAB — C3 COMPLEMENT: C3 Complement: 106 mg/dL (ref 82–167)

## 2018-04-08 LAB — ANA W/REFLEX IF POSITIVE: Anti Nuclear Antibody(ANA): NEGATIVE

## 2018-04-08 LAB — C4 COMPLEMENT: Complement C4, Body Fluid: 23 mg/dL (ref 14–44)

## 2018-04-08 MED ORDER — FUROSEMIDE 40 MG PO TABS
40.0000 mg | ORAL_TABLET | Freq: Two times a day (BID) | ORAL | Status: DC
  Administered 2018-04-08 – 2018-04-10 (×4): 40 mg via ORAL
  Filled 2018-04-08 (×4): qty 1

## 2018-04-08 NOTE — Progress Notes (Signed)
Subjective:  1450 UOP recorded- weight is not really down   - kidney function a little worse  - repeated everything she told me yesterday  Objective Vital signs in last 24 hours: Vitals:   04/07/18 1235 04/07/18 1700 04/08/18 0400 04/08/18 0749  BP: (!) 148/76 135/78 (!) 168/74 (!) 143/67  Pulse: 70 79 76 72  Resp: 18 19 18 18   Temp:  98.1 F (36.7 C) 98.4 F (36.9 C) 98.7 F (37.1 C)  TempSrc:  Oral Oral Oral  SpO2: 97% 98% 100% 97%  Weight:   62.1 kg   Height:       Weight change: 2 kg  Intake/Output Summary (Last 24 hours) at 04/08/2018 1117 Last data filed at 04/08/2018 1000 Gross per 24 hour  Intake 1580 ml  Output 1250 ml  Net 330 ml    Assessment/Plan  1.  Nephrotic syndrome: Pt with a several month history of increasing edema, higher BP s, hypercholesterolemia with markedly increased LDL, hypoalbuminemia.  Cr up from baseline of 1.1-1.3 to 1.78- now 1.97.  Considerations include membranous, FSGS (given FH of ESRD), possible minimal change, possible myeloma/amyloid.  Could also be exceptionally bad DN-  [place] in 07/2017 with relatively preserved size of kidneys.  S/P biopsy- given the holiday I think is highly unlikely we will have any results until 12/26.    She remains extremely fearful of dialysis and wants to do everything to prevent.  SPEP and serum free light chains, ANA pending -- C3 and C4 WNL  2.  Acute on chronic kidney disease: Baseline Cr 1.1-1.3, AKI likely due to relative hypoxia with Hgb 6.9 + underlying nephrotic syndrome.  trending a little worse - need to follow   3.  HTN/Volume:  diuresed with IV Lasix even though weights dont reflect - clinically seems to have improved- will change to oral lasix 40 mg BID  Think BP will come down as volume improves- amlodipine stopped  4.  Anemia: s/p 1 u pRBCs.  SPEP with + M-spike in April- repeat pending.  Hasn't had a colonoscopy.  Labs not indicative of hemolysis.  Tsat 22%.  Per primary- stable last 24 hours   5.   HFpEF- TTE performed - EF 55-60  Dispo- want to make sure still diureses on PO lasix, that kidney function not continuing to trend worse AND I would like to have her SPEP back as it was maybe positive in the past.  If positive will need heme/onc input prior to discharge    [place]: Basic Metabolic Panel: Recent Labs  Lab 04/04/18 1613  04/06/18 0626 04/07/18 0710 04/08/18 0530  NA 144   < > 141 144 138  K 4.6   < > 4.4 4.6 4.2  CL 109*   < > 113* 114* 107  CO2 21   < > 21* 24 23  GLUCOSE 140*   < > 110* 104* 234*  BUN 34*   < > 33* 32* 38*  CREATININE 1.78*   < > 1.76* 1.83* 1.97*  CALCIUM 8.5*   < > 8.0* 8.6* 8.1*  PHOS 4.1  --   --   --   --    < > = values in this interval not displayed.   Liver Function Tests: Recent Labs  Lab 04/04/18 1613 04/05/18 1702  AST 19 19  ALT 6 9  ALKPHOS 90 69  BILITOT 0.2 0.6  PROT 5.8* 6.2*  ALBUMIN 2.8* 2.4*   No  results for input(s): LIPASE, AMYLASE in the last 168 hours. No results for input(s): AMMONIA in the last 168 hours. CBC: Recent Labs  Lab 04/04/18 1628  04/05/18 1702 04/06/18 0626 04/07/18 0710 04/08/18 0530  WBC 10.0   < > 10.0 9.8 11.1* 12.0*  NEUTROABS 6.3  --  5.4 4.9  --   --   HGB 7.1*   < > 7.0* 7.6* 8.2* 8.0*  HCT 22.0*   < > 23.8* 24.5* 26.7* 25.4*  MCV 88  --  92.6 90.4 90.5 92.0  PLT 217   < > 212 175 183 187   < > = values in this interval not displayed.   Cardiac Enzymes: No results for input(s): CKTOTAL, CKMB, CKMBINDEX, TROPONINI in the last 168 hours. CBG: Recent Labs  Lab 04/07/18 0752 04/07/18 1232 04/07/18 1639 04/07/18 2146 04/08/18 0747  GLUCAP 87 88 230* 135* 206*    Iron Studies:  Recent Labs    04/05/18 1702  IRON 62  TIBC 280  FERRITIN 328*   Studies/Results: US Biopsy (kidney)  Result Date: 04/07/2018 INDICATION: Nephrotic syndrome and need for renal biopsy. EXAM: ULTRASOUND GUIDED CORE BIOPSY OF RIGHT KIDNEY MEDICATIONS: None.  ANESTHESIA/SEDATION: Fentanyl 50 mcg IV; Versed 1.0 mg IV Moderate Sedation Time:  14 minutes. The patient was continuously monitored during the procedure by the interventional radiology nurse under my direct supervision. PROCEDURE: The procedure, risks, benefits, and alternatives were explained to the patient. Questions regarding the procedure were encouraged and answered. The patient understands and consents to the procedure. Ultrasound was utilized in imaging and localizing both kidneys. The right was chosen for biopsy. The right flank region was prepped with chlorhexidine in a sterile fashion, and a sterile drape was applied covering the operative field. A sterile gown and sterile gloves were used for the procedure. Local anesthesia was provided with 1% Lidocaine. A 15 gauge trocar needle was advanced to the margin of the lower pole cortex of the right kidney. Under ultrasound guidance, 2 separate 16 gauge core biopsy samples were obtained of lower pole cortex. Samples were submitted in saline. Additional ultrasound was performed. COMPLICATIONS: None immediate. FINDINGS: The lower pole of the right kidney was better visualized than the left due to less shadowing from overlying ribs. Solid tissue was obtained with biopsy. There were no immediate complications. IMPRESSION: Ultrasound-guided core biopsy performed of lower pole cortex of the right kidney. Electronically Signed   By: Aletta Edouard M.D.   On: 04/07/2018 13:55   Medications: Infusions: . sodium chloride      Scheduled Medications: . sodium chloride   Intravenous Once  . cephALEXin  500 mg Oral Q12H  . furosemide  40 mg Intravenous Q12H  . insulin aspart  0-5 Units Subcutaneous QHS  . insulin aspart  0-9 Units Subcutaneous TID WC  . sertraline  50 mg Oral QHS  . sodium chloride flush  3 mL Intravenous Q12H  . sodium chloride flush  3 mL Intravenous Q12H    have reviewed scheduled and prn medications.  Physical Exam: General: alert,  NAD Heart: RRR Lungs: clear Abdomen: soft, non tender Extremities: pitting edema- she says is better     04/08/2018,11:17 AM  LOS: 3 days

## 2018-04-09 LAB — CBC
HCT: 26.8 % — ABNORMAL LOW (ref 36.0–46.0)
Hemoglobin: 8.4 g/dL — ABNORMAL LOW (ref 12.0–15.0)
MCH: 28 pg (ref 26.0–34.0)
MCHC: 31.3 g/dL (ref 30.0–36.0)
MCV: 89.3 fL (ref 80.0–100.0)
Platelets: 188 10*3/uL (ref 150–400)
RBC: 3 MIL/uL — ABNORMAL LOW (ref 3.87–5.11)
RDW: 14.8 % (ref 11.5–15.5)
WBC: 11.6 10*3/uL — AB (ref 4.0–10.5)
nRBC: 0 % (ref 0.0–0.2)

## 2018-04-09 LAB — BASIC METABOLIC PANEL
Anion gap: 8 (ref 5–15)
BUN: 38 mg/dL — ABNORMAL HIGH (ref 6–20)
CO2: 24 mmol/L (ref 22–32)
Calcium: 8.5 mg/dL — ABNORMAL LOW (ref 8.9–10.3)
Chloride: 106 mmol/L (ref 98–111)
Creatinine, Ser: 1.98 mg/dL — ABNORMAL HIGH (ref 0.44–1.00)
GFR calc non Af Amer: 27 mL/min — ABNORMAL LOW (ref >60)
GFR, EST AFRICAN AMERICAN: 31 mL/min — AB (ref >60)
Glucose, Bld: 144 mg/dL — ABNORMAL HIGH (ref 70–99)
Potassium: 4.4 mmol/L (ref 3.5–5.1)
Sodium: 138 mmol/L (ref 135–145)

## 2018-04-09 LAB — GLUCOSE, CAPILLARY
Glucose-Capillary: 141 mg/dL — ABNORMAL HIGH (ref 70–99)
Glucose-Capillary: 186 mg/dL — ABNORMAL HIGH (ref 70–99)
Glucose-Capillary: 186 mg/dL — ABNORMAL HIGH (ref 70–99)
Glucose-Capillary: 121 mg/dL — ABNORMAL HIGH (ref 70–99)

## 2018-04-09 NOTE — Progress Notes (Signed)
Family Medicine Teaching Service Daily Progress Note Intern Pager: [phone]  Patient name: Andrea Glenn Medical record number: [id number] Date of birth: 1960-05-10 Age: 57 y.o. Gender: female  Primary Care Provider: [place] Bing, DO Consultants: nephrology Code Status: full  Pt Overview and Major Events to Date:  12/18 admit for anemia workup 12/20 renal biopsy of right kidney obtained  Assessment and Plan: Andrea Glenn is a 57 y.o. female presenting with a two day history of increasing fatigue found to have Hgb of 6.9 in clinic yesterday . PMH is significant for chronic anemia, CKD, HFpEF, DM-II w/ peripheral neuropathy, depression, HLD, HTN, pruritus.   AKI on CKD 3 a/nephrotic syndrome Creatinine 1.98 from 1.97.  Nephrology following appreciate recs.  Still awaiting pathology results from renal biopsy.  We will continue to trend renal function.  Differential remains broad but biopsy hopefully will provide answers. -Nephrology consulted, appreciate recs -Follow-up renal biopsy results -Continue trending renal function and avoid nephrotoxic agents -Monitor I's and O's  Symptomatic normocytic anemia Hemoglobin stable 8.4 this a.m. from 8.0 on 12/21.  Status post 1 unit PRBC this admission.  MCV 89.  Patient undergoing extensive renal work-up for AKI on CKD/nephrotic syndrome. -Syndrome work-up as above -Avoid blood thinners -We will need colonoscopy as outpatient -PT/OT -Transfer from telemetry to [facility], stop cardiac monitoring  Acute cystitis: UA shows ketones with positive leukocyte.  Patient complaining of dysuria on admission.  Past cultures with nitrofurantoin sensitive E. coli. - Keflex 500 mg BID, (12/20- 12/26)  HFpEF: TTE performed 12/19 shows no change from previous on 06/15/2017.  BNP on admission is lower than previous.  Unlikely HF is contributing to AKI.  Low concern for cardiorenal syndrome. - Continue to monitor I&O's  Non-insulin dependant  Diabetes mellitus II w/ peripheral neuropathy:  Last A1c 6.0 12/17.  Patient takes dulaglutide 1.5mg  qWeekly.  CBG been well controlled. - Sliding scale insulin w/ meals and HS - CBG w/ meals and nightly     Depression:  Chronic.  Well-controlled on SSRI. - Continue zoloft 50mg   HLD: Chronic.  On high intensity statin.  Lipid panel today shows cholesterol 252, LDL 47,  TAG 234.  LDL has been consistently elevated for 4 years. ASCVD 25.6% 10 year risk (high risk). - Continue Lipitor 40 mg daily  HTN:   Amlodipine was stopped in response to leg swelling.    Systolic 086-578 and diastolic 46-96. - Continue holding HCTZ but may benefit from restarting in the near future  Pruritus  Chronic.  Had improvement with ivermectin and permethrin cream.  Initially suspicious for scabies.  Still uncertain what the source is. - Continue Benadryl 25mg  q6h PRN for itch - Continue capsaicin cream BID PRN  FEN/GI: carb modified diet Prophylaxis: SCDs s/p renal biopsy 04/07/2018  Disposition:  Anticipate discharge home once work-up for anemia and renal disease is complete.  Anticipate discharge within the next 24-72 hours.    Subjective:  Did not like her pancakes this am. No acute distress. Hopeful she can go home soon.  Objective: Temp:  [97.6 F (36.4 C)-98.8 F (37.1 C)] 97.6 F (36.4 C) (12/22 0749) Pulse Rate:  [67-76] 67 (12/22 0749) Resp:  [16-18] 18 (12/22 0749) BP: (154-177)/(72-79) 161/72 (12/22 0749) SpO2:  [99 %-100 %] 100 % (12/22 0749) Weight:  [60.2 kg] 60.2 kg (12/21 2047)  Physical Exam: General: 57 year old female resting comfortably in bed. No acute distress Cardiovascular: rrr w/o m/r/g Lungs: CTAB, no increased work of breathing  Abdomen: soft, non-tender, non-distended, normoactive bowel sounds Skin: warm, dry, diffuse excoriations and scabs, cap refill < 2 seconds, biopsy site intact without erythema or discharge Extremities: warm and well perfused, normal tone,  1+ pitting edema up to knees bilaterally  Laboratory: Recent Labs  Lab 04/07/18 0710 04/08/18 0530 04/09/18 0418  WBC 11.1* 12.0* 11.6*  HGB 8.2* 8.0* 8.4*  HCT 26.7* 25.4* 26.8*  PLT 183 187 188   Recent Labs  Lab 04/04/18 1613 04/05/18 1702  04/07/18 0710 04/08/18 0530 04/09/18 0418  NA 144 143   < > 144 138 138  K 4.6 4.2   < > 4.6 4.2 4.4  CL 109* 114*   < > 114* 107 106  CO2 21 23   < > 24 23 24   BUN 34* 31*   < > 32* 38* 38*  CREATININE 1.78* 1.60*   < > 1.83* 1.97* 1.98*  CALCIUM 8.5* 8.3*   < > 8.6* 8.1* 8.5*  PROT 5.8* 6.2*  --   --   --   --   BILITOT 0.2 0.6  --   --   --   --   ALKPHOS 90 69  --   --   --   --   ALT 6 9  --   --   --   --   AST 19 19  --   --   --   --   GLUCOSE 140* 133*   < > 104* 234* 144*   < > = values in this interval not displayed.    Imaging/Diagnostic Tests: ULTRASOUND GUIDED CORE BIOPSY OF RIGHT KIDNEY (04/07/2018) PROCEDURE: The procedure, risks, benefits, and alternatives were explained to the patient. Questions regarding the procedure were encouraged and answered. The patient understands and consents to the procedure. Ultrasound was utilized in imaging and localizing both kidneys. The right was chosen for biopsy. The right flank region was prepped with chlorhexidine in a sterile fashion, and a sterile drape was applied covering the operative field. A sterile gown and sterile gloves were used for the procedure. Local anesthesia was provided with 1% Lidocaine. A 15 gauge trocar needle was advanced to the margin of the lower pole cortex of the right kidney. Under ultrasound guidance, 2 separate 16 gauge core biopsy samples were obtained of lower pole cortex. Samples were submitted in saline. Additional ultrasound was performed. COMPLICATIONS: None immediate. FINDINGS: The lower pole of the right kidney was better visualized than the left due to less shadowing from overlying ribs. Solid tissue was obtained with biopsy. There were no  immediate complications. IMPRESSION: Ultrasound-guided core biopsy performed of lower pole cortex of the right kidney.    Guadalupe Dawn, MD 04/09/2018, 10:16 AM PGY-2, [place] Intern pager: [phone], text pages welcome

## 2018-04-09 NOTE — Progress Notes (Signed)
Subjective:  At least 1200 UOP recorded- weight is down   - kidney function stable   -   Objective Vital signs in last 24 hours: Vitals:   04/08/18 1707 04/08/18 2047 04/09/18 0532 04/09/18 0749  BP: (!) 168/78 (!) 154/72 (!) 177/79 (!) 161/72  Pulse: 76 72 72 67  Resp: 18 16 18 18   Temp: 98.4 F (36.9 C) 98.8 F (37.1 C) 98 F (36.7 C) 97.6 F (36.4 C)  TempSrc: Oral Oral Oral Oral  SpO2: 99% 100% 100% 100%  Weight:  60.2 kg    Height:       Weight change: -1.908 kg  Intake/Output Summary (Last 24 hours) at 04/09/2018 1108 Last data filed at 04/09/2018 0900 Gross per 24 hour  Intake 1480 ml  Output 1200 ml  Net 280 ml    Assessment/Plan  1.  Nephrotic syndrome: Pt with a several month history of increasing edema, higher BP s, hypercholesterolemia with markedly increased LDL, hypoalbuminemia.  Cr up from baseline of 1.1-1.3 to 1.78- now 1.97.  Considerations include membranous, FSGS (given FH of ESRD), possible minimal change, possible myeloma/amyloid.  Could also be exceptionally bad DN-  [place] in 07/2017 with relatively preserved size of kidneys.  S/P biopsy- given the holiday I think is highly unlikely we will have any results until 12/26.    She remains extremely fearful of dialysis and wants to do everything to prevent.  SPEP and serum free light chains still pending, ANA neg -- C3 and C4 WNL  2.  Acute on chronic kidney disease: Baseline Cr 1.1-1.3, AKI likely due to relative hypoxia with Hgb 6.9 + underlying nephrotic syndrome.  crt stable from yesterday   3.  HTN/Volume:  diuresed with IV Lasix even though weights dont reflect - clinically seems to have improved- have changed to oral lasix 40 mg BID in preparation for discharge.  Think BP will come down as volume improves- amlodipine stopped  4.  Anemia: s/p 1 u pRBCs.  SPEP with + M-spike in April- repeat pending.  Hasn't had a colonoscopy.  Labs not indicative of hemolysis.  Tsat 22%.  Per primary- stable last 48 hours    5.  HFpEF- TTE performed - EF 55-60  Dispo- seems she is diuresing on PO lasix, kidney function stable from yest-  I would like to have her SPEP back as it was positive in the past.  If positive may need heme/onc input prior to discharge - I dont know if would want to start treatment immediately or not and would hate to D/C before is back.  But would stop purewick and get her mobilized    [place]: Basic Metabolic Panel: Recent Labs  Lab 04/04/18 1613  04/07/18 0710 04/08/18 0530 04/09/18 0418  NA 144   < > 144 138 138  K 4.6   < > 4.6 4.2 4.4  CL 109*   < > 114* 107 106  CO2 21   < > 24 23 24   GLUCOSE 140*   < > 104* 234* 144*  BUN 34*   < > 32* 38* 38*  CREATININE 1.78*   < > 1.83* 1.97* 1.98*  CALCIUM 8.5*   < > 8.6* 8.1* 8.5*  PHOS 4.1  --   --   --   --    < > = values in this interval not displayed.   Liver Function Tests: Recent Labs  Lab 04/04/18 1613 04/05/18 1702  AST 19  19  ALT 6 9  ALKPHOS 90 69  BILITOT 0.2 0.6  PROT 5.8* 6.2*  ALBUMIN 2.8* 2.4*   No results for input(s): LIPASE, AMYLASE in the last 168 hours. No results for input(s): AMMONIA in the last 168 hours. CBC: Recent Labs  Lab 04/04/18 1628  04/05/18 1702 04/06/18 0626 04/07/18 0710 04/08/18 0530 04/09/18 0418  WBC 10.0   < > 10.0 9.8 11.1* 12.0* 11.6*  NEUTROABS 6.3  --  5.4 4.9  --   --   --   HGB 7.1*   < > 7.0* 7.6* 8.2* 8.0* 8.4*  HCT 22.0*   < > 23.8* 24.5* 26.7* 25.4* 26.8*  MCV 88  --  92.6 90.4 90.5 92.0 89.3  PLT 217   < > 212 175 183 187 188   < > = values in this interval not displayed.   Cardiac Enzymes: No results for input(s): CKTOTAL, CKMB, CKMBINDEX, TROPONINI in the last 168 hours. CBG: Recent Labs  Lab 04/08/18 0747 04/08/18 1153 04/08/18 1708 04/08/18 2047 04/09/18 0750  GLUCAP 206* 100* 188* 97 141*    Iron Studies:  No results for input(s): IRON, TIBC, TRANSFERRIN, FERRITIN in the last 72 hours. Studies/Results: US Biopsy  (kidney)  Result Date: 04/07/2018 INDICATION: Nephrotic syndrome and need for renal biopsy. EXAM: ULTRASOUND GUIDED CORE BIOPSY OF RIGHT KIDNEY MEDICATIONS: None. ANESTHESIA/SEDATION: Fentanyl 50 mcg IV; Versed 1.0 mg IV Moderate Sedation Time:  14 minutes. The patient was continuously monitored during the procedure by the interventional radiology nurse under my direct supervision. PROCEDURE: The procedure, risks, benefits, and alternatives were explained to the patient. Questions regarding the procedure were encouraged and answered. The patient understands and consents to the procedure. Ultrasound was utilized in imaging and localizing both kidneys. The right was chosen for biopsy. The right flank region was prepped with chlorhexidine in a sterile fashion, and a sterile drape was applied covering the operative field. A sterile gown and sterile gloves were used for the procedure. Local anesthesia was provided with 1% Lidocaine. A 15 gauge trocar needle was advanced to the margin of the lower pole cortex of the right kidney. Under ultrasound guidance, 2 separate 16 gauge core biopsy samples were obtained of lower pole cortex. Samples were submitted in saline. Additional ultrasound was performed. COMPLICATIONS: None immediate. FINDINGS: The lower pole of the right kidney was better visualized than the left due to less shadowing from overlying ribs. Solid tissue was obtained with biopsy. There were no immediate complications. IMPRESSION: Ultrasound-guided core biopsy performed of lower pole cortex of the right kidney. Electronically Signed   By: Aletta Edouard M.D.   On: 04/07/2018 13:55   Medications: Infusions: . sodium chloride      Scheduled Medications: . sodium chloride   Intravenous Once  . cephALEXin  500 mg Oral Q12H  . furosemide  40 mg Oral BID  . insulin aspart  0-5 Units Subcutaneous QHS  . insulin aspart  0-9 Units Subcutaneous TID WC  . sertraline  50 mg Oral QHS  . sodium chloride  flush  3 mL Intravenous Q12H  . sodium chloride flush  3 mL Intravenous Q12H    have reviewed scheduled and prn medications.  Physical Exam: General: alert, NAD Heart: RRR Lungs: clear Abdomen: soft, non tender Extremities: pitting edema- she says is better     04/09/2018,11:08 AM  LOS: 4 days

## 2018-04-09 NOTE — Progress Notes (Signed)
Subjective:   At least 1200 UOP recorded- swelling is better , weight is down  - kidney function about the same  Objective Vital signs in last 24 hours: Vitals:   04/08/18 1707 04/08/18 2047 04/09/18 0532 04/09/18 0749  BP: (!) 168/78 (!) 154/72 (!) 177/79 (!) 161/72  Pulse: 76 72 72 67  Resp: 18 16 18 18   Temp: 98.4 F (36.9 C) 98.8 F (37.1 C) 98 F (36.7 C) 97.6 F (36.4 C)  TempSrc: Oral Oral Oral Oral  SpO2: 99% 100% 100% 100%  Weight:  60.2 kg    Height:       Weight change: -1.908 kg  Intake/Output Summary (Last 24 hours) at 04/09/2018 1058 Last data filed at 04/09/2018 0900 Gross per 24 hour  Intake 1480 ml  Output 1200 ml  Net 280 ml    Assessment/Plan  1.  Nephrotic syndrome: Pt with a several month history of increasing edema, higher BP s, hypercholesterolemia with markedly increased LDL, hypoalbuminemia.  Cr up from baseline of 1.1-1.3 to 1.78.  Considerations include membranous, FSGS (given FH of ESRD), possible minimal change, possible myeloma/amyloid.  Could also be exceptionally bad DN but has had DM since 2011 and seems a little quick for such a large amount of proteinuria.  [place] in 07/2017 with relatively preserved size of kidneys.  S/P biopsy- given the holiday I think is highly unlikely we will have any results until 12/26.    She remains extremely fearful of dialysis and wants to do everything to prevent.  SPEP and serum free light chains, ANA, C3, and C4 complements still pending.     2.  Acute on chronic kidney disease: Baseline Cr 1.1-1.3, AKI likely due to relative hypoxia with Hgb 6.9 + underlying nephrotic syndrome.    3.  HTN/Volume: will diurese with IV Lasix - clinically seems to have improved- continue IV lasix.  Think BP will come down as volume improves- amlodipine stopped  4.  Anemia: s/p 1 u pRBCs.  SPEP with + M-spike in April.  Hasn't had a colonoscopy.  Labs not indicative of hemolysis.  Tsat 22%.  Per primary  5.  HFpEF- TTE performed -  EF 55-60   Rise Traeger A Cashus Halterman    Labs: Basic Metabolic Panel: Recent Labs  Lab 04/04/18 1613  04/07/18 0710 04/08/18 0530 04/09/18 0418  NA 144   < > 144 138 138  K 4.6   < > 4.6 4.2 4.4  CL 109*   < > 114* 107 106  CO2 21   < > 24 23 24   GLUCOSE 140*   < > 104* 234* 144*  BUN 34*   < > 32* 38* 38*  CREATININE 1.78*   < > 1.83* 1.97* 1.98*  CALCIUM 8.5*   < > 8.6* 8.1* 8.5*  PHOS 4.1  --   --   --   --    < > = values in this interval not displayed.   Liver Function Tests: Recent Labs  Lab 04/04/18 1613 04/05/18 1702  AST 19 19  ALT 6 9  ALKPHOS 90 69  BILITOT 0.2 0.6  PROT 5.8* 6.2*  ALBUMIN 2.8* 2.4*   No results for input(s): LIPASE, AMYLASE in the last 168 hours. No results for input(s): AMMONIA in the last 168 hours. CBC: Recent Labs  Lab 04/04/18 1628  04/05/18 1702 04/06/18 0626 04/07/18 0710 04/08/18 0530 04/09/18 0418  WBC 10.0   < > 10.0 9.8 11.1* 12.0* 11.6*  NEUTROABS 6.3  --  5.4 4.9  --   --   --   HGB 7.1*   < > 7.0* 7.6* 8.2* 8.0* 8.4*  HCT 22.0*   < > 23.8* 24.5* 26.7* 25.4* 26.8*  MCV 88  --  92.6 90.4 90.5 92.0 89.3  PLT 217   < > 212 175 183 187 188   < > = values in this interval not displayed.   Cardiac Enzymes: No results for input(s): CKTOTAL, CKMB, CKMBINDEX, TROPONINI in the last 168 hours. CBG: Recent Labs  Lab 04/08/18 0747 04/08/18 1153 04/08/18 1708 04/08/18 2047 04/09/18 0750  GLUCAP 206* 100* 188* 97 141*    Iron Studies:  No results for input(s): IRON, TIBC, TRANSFERRIN, FERRITIN in the last 72 hours. Studies/Results: US Biopsy (kidney)  Result Date: 04/07/2018 INDICATION: Nephrotic syndrome and need for renal biopsy. EXAM: ULTRASOUND GUIDED CORE BIOPSY OF RIGHT KIDNEY MEDICATIONS: None. ANESTHESIA/SEDATION: Fentanyl 50 mcg IV; Versed 1.0 mg IV Moderate Sedation Time:  14 minutes. The patient was continuously monitored during the procedure by the interventional radiology nurse under my direct supervision.  PROCEDURE: The procedure, risks, benefits, and alternatives were explained to the patient. Questions regarding the procedure were encouraged and answered. The patient understands and consents to the procedure. Ultrasound was utilized in imaging and localizing both kidneys. The right was chosen for biopsy. The right flank region was prepped with chlorhexidine in a sterile fashion, and a sterile drape was applied covering the operative field. A sterile gown and sterile gloves were used for the procedure. Local anesthesia was provided with 1% Lidocaine. A 15 gauge trocar needle was advanced to the margin of the lower pole cortex of the right kidney. Under ultrasound guidance, 2 separate 16 gauge core biopsy samples were obtained of lower pole cortex. Samples were submitted in saline. Additional ultrasound was performed. COMPLICATIONS: None immediate. FINDINGS: The lower pole of the right kidney was better visualized than the left due to less shadowing from overlying ribs. Solid tissue was obtained with biopsy. There were no immediate complications. IMPRESSION: Ultrasound-guided core biopsy performed of lower pole cortex of the right kidney. Electronically Signed   By: Aletta Edouard M.D.   On: 04/07/2018 13:55   Medications: Infusions: . sodium chloride      Scheduled Medications: . sodium chloride   Intravenous Once  . cephALEXin  500 mg Oral Q12H  . furosemide  40 mg Oral BID  . insulin aspart  0-5 Units Subcutaneous QHS  . insulin aspart  0-9 Units Subcutaneous TID WC  . sertraline  50 mg Oral QHS  . sodium chloride flush  3 mL Intravenous Q12H  . sodium chloride flush  3 mL Intravenous Q12H    have reviewed scheduled and prn medications.  Physical Exam: General: alert, NAD Heart: RRR Lungs: clear Abdomen: soft, non tender Extremities: pitting edema- she says is better     04/09/2018,10:58 AM  LOS: 4 days

## 2018-04-10 ENCOUNTER — Ambulatory Visit (HOSPITAL_COMMUNITY): Payer: Medicaid Other

## 2018-04-10 LAB — PROTEIN ELECTROPHORESIS, SERUM
A/G Ratio: 0.9 (ref 0.7–1.7)
ALPHA-1-GLOBULIN: 0.2 g/dL (ref 0.0–0.4)
ALPHA-2-GLOBULIN: 0.8 g/dL (ref 0.4–1.0)
Albumin ELP: 2.5 g/dL — ABNORMAL LOW (ref 2.9–4.4)
Beta Globulin: 0.7 g/dL (ref 0.7–1.3)
Gamma Globulin: 1.1 g/dL (ref 0.4–1.8)
Globulin, Total: 2.7 g/dL (ref 2.2–3.9)
M-SPIKE, %: 0.2 g/dL — AB
Total Protein ELP: 5.2 g/dL — ABNORMAL LOW (ref 6.0–8.5)

## 2018-04-10 LAB — PATHOLOGIST SMEAR REVIEW
Basophils Absolute: 0.1 10*3/uL (ref 0.0–0.2)
Basos: 1 %
EOS (ABSOLUTE): 0.3 10*3/uL (ref 0.0–0.4)
Eos: 3 %
HEMOGLOBIN: 7.1 g/dL — AB (ref 11.1–15.9)
Hematocrit: 22 % — ABNORMAL LOW (ref 34.0–46.6)
Immature Grans (Abs): 0 10*3/uL (ref 0.0–0.1)
Immature Granulocytes: 0 %
LYMPHS ABS: 2.8 10*3/uL (ref 0.7–3.1)
Lymphs: 29 %
MCH: 28.3 pg (ref 26.6–33.0)
MCHC: 32.3 g/dL (ref 31.5–35.7)
MCV: 88 fL (ref 79–97)
Monocytes Absolute: 0.5 10*3/uL (ref 0.1–0.9)
Monocytes: 5 %
Neutrophils Absolute: 6.3 10*3/uL (ref 1.4–7.0)
Neutrophils: 62 %
Path Rev PLTs: NORMAL
Path Rev WBC: NORMAL
Platelets: 217 10*3/uL (ref 150–450)
RBC: 2.51 x10E6/uL — CL (ref 3.77–5.28)
RDW: 14.5 % (ref 12.3–15.4)
WBC: 10 10*3/uL (ref 3.4–10.8)

## 2018-04-10 LAB — CBC WITH DIFFERENTIAL/PLATELET
Abs Immature Granulocytes: 0.04 10*3/uL (ref 0.00–0.07)
Basophils Absolute: 0.1 10*3/uL (ref 0.0–0.1)
Basophils Relative: 1 %
Eosinophils Absolute: 0.3 10*3/uL (ref 0.0–0.5)
Eosinophils Relative: 2 %
HCT: 28.1 % — ABNORMAL LOW (ref 36.0–46.0)
Hemoglobin: 8.7 g/dL — ABNORMAL LOW (ref 12.0–15.0)
Immature Granulocytes: 0 %
Lymphocytes Relative: 27 %
Lymphs Abs: 3.3 10*3/uL (ref 0.7–4.0)
MCH: 27.6 pg (ref 26.0–34.0)
MCHC: 31 g/dL (ref 30.0–36.0)
MCV: 89.2 fL (ref 80.0–100.0)
Monocytes Absolute: 0.6 10*3/uL (ref 0.1–1.0)
Monocytes Relative: 5 %
Neutro Abs: 7.8 10*3/uL — ABNORMAL HIGH (ref 1.7–7.7)
Neutrophils Relative %: 65 %
Platelets: 189 10*3/uL (ref 150–400)
RBC: 3.15 MIL/uL — AB (ref 3.87–5.11)
RDW: 15 % (ref 11.5–15.5)
WBC: 12.1 10*3/uL — AB (ref 4.0–10.5)
nRBC: 0 % (ref 0.0–0.2)

## 2018-04-10 LAB — RENAL FUNCTION PANEL
Albumin: 2 g/dL — ABNORMAL LOW (ref 3.5–5.0)
Anion gap: 9 (ref 5–15)
BUN: 42 mg/dL — AB (ref 6–20)
CO2: 25 mmol/L (ref 22–32)
Calcium: 8.6 mg/dL — ABNORMAL LOW (ref 8.9–10.3)
Chloride: 107 mmol/L (ref 98–111)
Creatinine, Ser: 2.06 mg/dL — ABNORMAL HIGH (ref 0.44–1.00)
GFR calc Af Amer: 30 mL/min — ABNORMAL LOW (ref >60)
GFR calc non Af Amer: 26 mL/min — ABNORMAL LOW (ref >60)
GLUCOSE: 132 mg/dL — AB (ref 70–99)
Phosphorus: 3.7 mg/dL (ref 2.5–4.6)
Potassium: 4.3 mmol/L (ref 3.5–5.1)
SODIUM: 141 mmol/L (ref 135–145)

## 2018-04-10 LAB — GLUCOSE, CAPILLARY
Glucose-Capillary: 103 mg/dL — ABNORMAL HIGH (ref 70–99)
Glucose-Capillary: 144 mg/dL — ABNORMAL HIGH (ref 70–99)

## 2018-04-10 MED ORDER — FUROSEMIDE 40 MG PO TABS: 40.0000 mg | ORAL_TABLET | Freq: Two times a day (BID) | ORAL | 0 refills | Status: DC

## 2018-04-10 MED ORDER — CEPHALEXIN 500 MG PO CAPS: 500.0000 mg | ORAL_CAPSULE | Freq: Two times a day (BID) | ORAL | 0 refills | Status: AC

## 2018-04-10 NOTE — Care Management Note (Signed)
Case Management Note Manya Silvas, RN MSN CCM Transitions of Care 28[place] [phone]  Patient Details  Name: Andrea Glenn MRN: [id number] Date of Birth: [birth year]  Subjective/Objective:          symptomatic anemia s/p renal biopsy          Action/Plan: PTA home with family (nephew, grandson). Pt to f/u with [name]-Dr. Daryll Drown. Meds received from [facility]. Pt was able to verify that prescription was received. Pt states that she has transportation. Noted note from financial counselor that patient is pending approval for SSI Medicaid. No other transition of care needs identified at this time.   Expected Discharge Date:  04/10/18               Expected Discharge Plan:  Home/Self Care  In-House Referral:  NA  Discharge planning Services  CM Consult  Post Acute Care Choice:  NA Choice offered to:  NA  DME Arranged:  N/A DME Agency:  NA  HH Arranged:  NA HH Agency:  NA  Status of Service:  Completed, signed off  If discussed at [place] of Stay Meetings, dates discussed:    Additional Comments:  Bartholomew Crews, RN 04/10/2018, 2:46 PM

## 2018-04-10 NOTE — Progress Notes (Signed)
[name] to be D/C'd Home per MD order.  Discussed prescriptions and follow up appointments with the patient. Prescriptions given to patient, medication list explained in detail. Pt verbalized understanding.  Allergies as of 04/10/2018      Reactions   Atorvastatin Other (See Comments)   Suicidal thoughts   Statins Other (See Comments)   This class of meds causes "suicidal thoughts"   Amitriptyline Other (See Comments)   Depression   Metformin And Related Diarrhea   Adhesive [tape] Other (See Comments)   "Sometimes peels off my skin"   Gabapentin Other (See Comments)   "Gives me the shakes"      Medication List    TAKE these medications   acetaminophen 500 MG tablet Commonly known as:  TYLENOL Take 1,000 mg by mouth every 6 (six) hours as needed (for pain or headaches).   bisacodyl 5 MG EC tablet Commonly known as:  DULCOLAX Take 1 tablet (5 mg total) by mouth daily as needed for moderate constipation.   capsaicin 0.025 % cream Commonly known as:  ZOSTRIX Apply topically 2 (two) times daily as needed (pain).   cephALEXin 500 MG capsule Commonly known as:  KEFLEX Take 1 capsule (500 mg total) by mouth every 12 (twelve) hours for 3 days.   clotrimazole 1 % cream Commonly known as:  LOTRIMIN Apply 1 application topically 2 (two) times daily.   diphenhydrAMINE 25 mg capsule Commonly known as:  BENADRYL ALLERGY Take 1 capsule (25 mg total) by mouth every 6 (six) hours as needed.   Dulaglutide 1.5 MG/0.5ML Sopn Inject 1.5 mg into the skin once a week.   DULoxetine 30 MG capsule Commonly known as:  CYMBALTA Take 1 capsule (30 mg total) by mouth daily.   FLUoxetine 20 MG capsule Commonly known as:  PROZAC Take 20 mg by mouth daily.   furosemide 40 MG tablet Commonly known as:  LASIX Take 1 tablet (40 mg total) by mouth 2 (two) times daily.   hydrOXYzine 25 MG tablet Commonly known as:  ATARAX/VISTARIL Take 1 tablet (25 mg total) by mouth 3 (three) times  daily as needed.   Medical Compression Stockings Misc 1 Package by Does not apply route daily.   polyvinyl alcohol 1.4 % ophthalmic solution Commonly known as:  LIQUIFILM TEARS Place 2 drops into both eyes 4 (four) times daily.   sertraline 50 MG tablet Commonly known as:  ZOLOFT Take 1 tablet (50 mg total) by mouth daily.       Vitals:   04/10/18 0516 04/10/18 0927  BP: (!) 190/83 (!) 160/72  Pulse: 77 75  Resp: 18 18  Temp: 98.3 F (36.8 C)   SpO2: 100% 97%    IV catheter discontinued intact. Site without signs and symptoms of complications. Dressing and pressure applied. Pt denies pain at this time.  An After Visit Summary was printed and given to the patient. Patient escorted via [place], and D/C home via private auto.  Orville Govern, RN

## 2018-04-10 NOTE — Progress Notes (Signed)
[facility] ROUNDING NOTE   Subjective:   No complaints this morning.  Blood pressure 160/72 pulse of 75 temperature 98.3.  Urine output 1.2 L 04/09/2018 weight stable 60.2.  Kilograms this is down from 62.1 kg on admission.  Sodium 141 potassium 4.3 chloride 107 CO2 25 BUN 42 creatinine 2.06 glucose 132 phosphorus 3.7 albumin 2.0 WBC 12.1 hemoglobin 8.7 platelets 189  ANA negative C3 106 C4 23 serum electrophoresis pending kappa/lambda light chains pending.  HIV  nonreactive.  Hep C negative   Objective:  Vital signs in last 24 hours:  Temp:  [98.3 F (36.8 C)-98.7 F (37.1 C)] 98.3 F (36.8 C) (12/23 0516) Pulse Rate:  [75-77] 75 (12/23 0927) Resp:  [18] 18 (12/23 0927) BP: (160-190)/(72-88) 160/72 (12/23 0927) SpO2:  [97 %-100 %] 97 % (12/23 0927) Weight:  [60.2 kg] 60.2 kg (12/22 2034)  Weight change: 0.008 kg Filed Weights   04/08/18 0400 04/08/18 2047 04/09/18 2034  Weight: 62.1 kg 60.2 kg 60.2 kg    Intake/Output: I/O last 3 completed shifts: In: 1860 [P.O.:1860] Out: 1200 [Urine:1200]   Intake/Output this shift:  Total I/O In: 300 [P.O.:300] Out: -   CVS- RRR no murmurs rubs gallops JVP not elevated  RS- CTA no wheezes no rales ABD- BS present soft non-distended EXT-2+ lower extremity edema   Basic Metabolic Panel: Recent Labs  Lab 04/04/18 1613  04/06/18 0626 04/07/18 0710 04/08/18 0530 04/09/18 0418 04/10/18 0442  NA 144   < > 141 144 138 138 141  K 4.6   < > 4.4 4.6 4.2 4.4 4.3  CL 109*   < > 113* 114* 107 106 107  CO2 21   < > 21* 24 23 24 25   GLUCOSE 140*   < > 110* 104* 234* 144* 132*  BUN 34*   < > 33* 32* 38* 38* 42*  CREATININE 1.78*   < > 1.76* 1.83* 1.97* 1.98* 2.06*  CALCIUM 8.5*   < > 8.0* 8.6* 8.1* 8.5* 8.6*  PHOS 4.1  --   --   --   --   --  3.7   < > = values in this interval not displayed.    Liver Function Tests: Recent Labs  Lab 04/04/18 1613 04/05/18 1702 04/10/18 0442  AST 19 19  --   ALT 6 9  --    ALKPHOS 90 69  --   BILITOT 0.2 0.6  --   PROT 5.8* 6.2*  --   ALBUMIN 2.8* 2.4* 2.0*   No results for input(s): LIPASE, AMYLASE in the last 168 hours. No results for input(s): AMMONIA in the last 168 hours.  CBC: Recent Labs  Lab 04/04/18 1628  04/05/18 1702 04/06/18 0626 04/07/18 0710 04/08/18 0530 04/09/18 0418 04/10/18 0442  WBC 10.0   < > 10.0 9.8 11.1* 12.0* 11.6* 12.1*  NEUTROABS 6.3  --  5.4 4.9  --   --   --  7.8*  HGB 7.1*   < > 7.0* 7.6* 8.2* 8.0* 8.4* 8.7*  HCT 22.0*   < > 23.8* 24.5* 26.7* 25.4* 26.8* 28.1*  MCV 88   < > 92.6 90.4 90.5 92.0 89.3 89.2  PLT 217   < > 212 175 183 187 188 189   < > = values in this interval not displayed.    Cardiac Enzymes: No results for input(s): CKTOTAL, CKMB, CKMBINDEX, TROPONINI in the last 168 hours.  BNP: Invalid input(s): POCBNP  CBG: Recent Labs  Lab  04/09/18 0750 04/09/18 1132 04/09/18 1622 04/09/18 2033 04/10/18 0753  GLUCAP 141* 186* 186* 121* 103*    Microbiology: Results for orders placed or performed during the hospital encounter of 09/09/17  Urine culture     Status: Abnormal   Collection Time: 09/10/17  7:31 AM  Result Value Ref Range Status   Specimen Description URINE, RANDOM  Final   Special Requests   Final    NONE Performed at [facility], [place] [address], [place], [place] [address]    Culture MULTIPLE SPECIES PRESENT, SUGGEST RECOLLECTION (A)  Final   Report Status 09/11/2017 FINAL  Final    Coagulation Studies: No results for input(s): LABPROT, INR in the last 72 hours.  Urinalysis: No results for input(s): COLORURINE, LABSPEC, PHURINE, GLUCOSEU, HGBUR, BILIRUBINUR, KETONESUR, PROTEINUR, UROBILINOGEN, NITRITE, LEUKOCYTESUR in the last 72 hours.  Invalid input(s): APPERANCEUR    Imaging: No results found.   Medications:   . sodium chloride     . sodium chloride   Intravenous Once  . cephALEXin  500 mg Oral Q12H  . furosemide  40 mg Oral BID  . insulin aspart  0-5  Units Subcutaneous QHS  . insulin aspart  0-9 Units Subcutaneous TID WC  . sertraline  50 mg Oral QHS  . sodium chloride flush  3 mL Intravenous Q12H  . sodium chloride flush  3 mL Intravenous Q12H   sodium chloride, acetaminophen **OR** acetaminophen, capsaicin, diphenhydrAMINE, polyethylene glycol, sodium chloride flush  Assessment/ Plan:   Nephrotic syndrome.  Several months with increasing edema.  Ultrasound-guided renal biopsy performed on 04/07/2018.  Results pending.  Continues to diuresis on Lasix 40 mg bid  Weight has improved.  Creatinine appears to have progressed with diuresis.  No ACE inhibitor.  Continue to avoid nonsteroidal anti-inflammatory drugs Cox 2 inhibitors IV contrast  Anemia last hemoglobin stable iron stores 22% and will initiate darbepoetin.  200 mcg weekly.  Status post transfusion 1 pack red blood cells 04/05/2018  Secondary hyperparathyroidism check PTH  Hypertension/volume.  Continues on oral Lasix 40 mg twice daily.  Diastolic heart failure TTE EF 55 to 60%  Diabetes mellitus.  Appears to be well controlled  Depression continues on Zoloft  Hyperlipidemia continues Lipitor 40 mg daily  History of cystitis completing course of Keflex appears to have a E. coli sensitive to Keflex  Awaiting results of renal biopsy.  These can be [facility] up at [facility].  Will sign off for now.  Please call back if needed [phone] thank you     LOS: [place] @TODAY @9 :40 AM

## 2018-04-12 LAB — KAPPA/LAMBDA LIGHT CHAINS
Kappa free light chain: 113.7 mg/L — ABNORMAL HIGH (ref 3.3–19.4)
Kappa, lambda light chain ratio: 1.48 (ref 0.26–1.65)
Lambda free light chains: 77 mg/L — ABNORMAL HIGH (ref 5.7–26.3)

## 2018-04-13 ENCOUNTER — Ambulatory Visit (INDEPENDENT_AMBULATORY_CARE_PROVIDER_SITE_OTHER): Payer: Medicaid Other | Admitting: Family Medicine

## 2018-04-13 VITALS — BP 142/70 | HR 84 | Temp 98.0°F | Wt 131.2 lb

## 2018-04-13 DIAGNOSIS — D638 Anemia in other chronic diseases classified elsewhere: Secondary | ICD-10-CM

## 2018-04-13 DIAGNOSIS — N049 Nephrotic syndrome with unspecified morphologic changes: Secondary | ICD-10-CM

## 2018-04-13 DIAGNOSIS — N3000 Acute cystitis without hematuria: Secondary | ICD-10-CM | POA: Diagnosis not present

## 2018-04-13 MED ORDER — CAPSAICIN 0.025 % EX CREA: TOPICAL_CREAM | Freq: Two times a day (BID) | CUTANEOUS | 0 refills | Status: AC | PRN

## 2018-04-13 NOTE — Patient Instructions (Addendum)
It was great seeing you today! We have addressed the following issues today  1. I want you to continue taking the lasix two times a day.  2. Finish your antibiotic course for your UTI 3. I will check your electrolytes and hemoglobin 4. Make sure you call [place] to make an appointment to see the kidney doctor. Phone Number: [phone]  If we did any lab work today, and the results require attention, either me or my nurse will get in touch with you. If everything is normal, you will get a letter in mail and a message via . If you don't hear from [place] in two weeks, please give [place] a call. Otherwise, we look forward to seeing you again at your next visit. If you have any questions or concerns before then, please call the clinic at [phone].  Please bring all your medications to every doctors visit  Sign up for My Chart to have easy access to your labs results, and communication with your Primary care physician. Please ask Front Desk for some assistance.   Please check-out at the front desk before leaving the clinic.    Take Care,   Dr. Andy Gauss

## 2018-04-13 NOTE — Assessment & Plan Note (Addendum)
Patient continue to take lasix as instructed. Renal biopsy results are still pending. Patient given number to make appointment with nephrology. LE edema improving. --Order BMP to check K+ --Continue Lasix

## 2018-04-13 NOTE — Assessment & Plan Note (Signed)
Will check CBC today, patient received 1 units while inpatient and is schedule to received ESA on a weekly basis per nephrology. She will follow up with nephrology.

## 2018-04-13 NOTE — Assessment & Plan Note (Signed)
Denies dysuria or other urinary symptoms. Will finish Keflex course today. Follow up prn

## 2018-04-13 NOTE — Progress Notes (Signed)
   Subjective:    Patient ID: Andrea Glenn, female    DOB: 1961-03-19, 57 y.o.   MRN: [id number]   CC: Hospital discharge follow up   HPI: Patient is a 57 yo female who presents today for hospital follow up for anemia and nephrotic syndrome. Patient reports she has been taking her lasix two times a day as instructed in the hospital. Lower extremity swelling has improved but is still present Patient states she has one more keflex pill to take this evening. She is eager to find out the result of her kidney biopsy. She refused to even consider dialysis because multiple family members currently on it. She denies any dizziness or fatigue. Patient knows that she needs to follow up with nephrology. She continues to be off amlodipine.   Smoking status reviewed   ROS: all other systems were reviewed and are negative other than in the HPI   Past Medical History:  Diagnosis Date  . Diabetes mellitus without complication ([place])   . Edema of both legs   . Grief 09/08/2017  . Hypertension   . Neuropathy     Past Surgical History:  Procedure Laterality Date  . BACK SURGERY    . CESAREAN SECTION    . FOOT SURGERY    . SHOULDER SURGERY      Past medical history, surgical, family, and social history reviewed and updated in the EMR as appropriate.  Objective:  BP (!) 142/70   Pulse 84   Temp 98 F (36.7 C) (Oral)   Wt 131 lb 4 oz (59.5 kg)   SpO2 99%   BMI 22.53 kg/m   Vitals and nursing note reviewed  General: NAD, pleasant, able to participate in exam Cardiac: RRR, normal heart sounds, no murmurs. 2+ radial and PT pulses bilaterally Respiratory: CTAB, normal effort, No wheezes, rales or rhonchi Abdomen: soft, nontender, nondistended, no hepatic or splenomegaly, +BS Extremities: +1 bilateral lower extremity edema.  Skin: warm and dry, no rashes noted Neuro: alert and oriented x4, no focal deficits Psych: Normal affect and mood   Assessment & Plan:   Nephrotic syndrome Patient  continue to take lasix as instructed. Renal biopsy results are still pending. Patient given number to make appointment with nephrology. LE edema improving. --Order BMP to check K+ --Continue Lasix  Urinary tract infection without hematuria Denies dysuria or other urinary symptoms. Will finish Keflex course today. Follow up prn  Anemia of chronic disease Will check CBC today, patient received 1 units while inpatient and is schedule to received ESA on a weekly basis per nephrology. She will follow up with nephrology.    Marjie Skiff, MD [place] PGY-3

## 2018-04-14 LAB — CBC WITH DIFFERENTIAL/PLATELET
Basophils Absolute: 0.1 10*3/uL (ref 0.0–0.2)
Basos: 1 %
EOS (ABSOLUTE): 0.2 10*3/uL (ref 0.0–0.4)
Eos: 2 %
Hematocrit: 26.6 % — ABNORMAL LOW (ref 34.0–46.6)
Hemoglobin: 8.7 g/dL — ABNORMAL LOW (ref 11.1–15.9)
IMMATURE GRANULOCYTES: 0 %
Immature Grans (Abs): 0 10*3/uL (ref 0.0–0.1)
Lymphocytes Absolute: 4.2 10*3/uL — ABNORMAL HIGH (ref 0.7–3.1)
Lymphs: 35 %
MCH: 28.9 pg (ref 26.6–33.0)
MCHC: 32.7 g/dL (ref 31.5–35.7)
MCV: 88 fL (ref 79–97)
MONOS ABS: 0.5 10*3/uL (ref 0.1–0.9)
Monocytes: 5 %
NEUTROS PCT: 57 %
Neutrophils Absolute: 6.7 10*3/uL (ref 1.4–7.0)
Platelets: 241 10*3/uL (ref 150–450)
RBC: 3.01 x10E6/uL — ABNORMAL LOW (ref 3.77–5.28)
RDW: 14.2 % (ref 12.3–15.4)
WBC: 11.8 10*3/uL — ABNORMAL HIGH (ref 3.4–10.8)

## 2018-04-14 LAB — BASIC METABOLIC PANEL
BUN/Creatinine Ratio: 26 — ABNORMAL HIGH (ref 9–23)
BUN: 47 mg/dL — ABNORMAL HIGH (ref 6–24)
CO2: 22 mmol/L (ref 20–29)
Calcium: 9.1 mg/dL (ref 8.7–10.2)
Chloride: 106 mmol/L (ref 96–106)
Creatinine, Ser: 1.78 mg/dL — ABNORMAL HIGH (ref 0.57–1.00)
GFR calc Af Amer: 36 mL/min/{1.73_m2} — ABNORMAL LOW (ref >59)
GFR calc non Af Amer: 31 mL/min/{1.73_m2} — ABNORMAL LOW (ref >59)
Glucose: 189 mg/dL — ABNORMAL HIGH (ref 65–99)
Potassium: 4.6 mmol/L (ref 3.5–5.2)
Sodium: 140 mmol/L (ref 134–144)

## 2018-04-24 ENCOUNTER — Telehealth: Payer: Self-pay | Admitting: Family Medicine

## 2018-04-24 NOTE — Telephone Encounter (Signed)
Pt  Called to check status on on her results. ad

## 2018-04-27 ENCOUNTER — Encounter (HOSPITAL_COMMUNITY): Payer: Self-pay | Admitting: Nephrology

## 2018-05-11 ENCOUNTER — Other Ambulatory Visit: Payer: Self-pay

## 2018-05-11 ENCOUNTER — Ambulatory Visit (INDEPENDENT_AMBULATORY_CARE_PROVIDER_SITE_OTHER): Payer: Medicaid Other | Admitting: Family Medicine

## 2018-05-11 VITALS — BP 160/82 | HR 86 | Temp 98.3°F | Ht 64.0 in | Wt 135.2 lb

## 2018-05-11 DIAGNOSIS — L299 Pruritus, unspecified: Secondary | ICD-10-CM

## 2018-05-11 DIAGNOSIS — I10 Essential (primary) hypertension: Secondary | ICD-10-CM | POA: Diagnosis not present

## 2018-05-11 DIAGNOSIS — L989 Disorder of the skin and subcutaneous tissue, unspecified: Secondary | ICD-10-CM | POA: Diagnosis present

## 2018-05-11 MED ORDER — LEVOCETIRIZINE DIHYDROCHLORIDE 5 MG PO TABS: 5.0000 mg | ORAL_TABLET | Freq: Every evening | ORAL | 0 refills | Status: DC

## 2018-05-11 MED ORDER — TRIAMCINOLONE ACETONIDE 0.1 % EX CREA: 1.0000 "application " | TOPICAL_CREAM | Freq: Two times a day (BID) | CUTANEOUS | 0 refills | Status: DC

## 2018-05-11 NOTE — Progress Notes (Signed)
Subjective:     Patient ID: Andrea Glenn, female   DOB: 22-Aug-1960, 58 y.o.   MRN: [id number]  HPI Skin lesion: Since august. All over her body, itches a lot. Treated with Permethrin, Ivermectin and some injection. The rash seems to be spreading despite treatment. She was tested for scabies and the result was negative per patient. Uses Benadryl for itching which helps just a little. HTN: Took med at 2 AM today. Her nephrologist just added another medicine on yesterday she she took today as well.  Current Outpatient Medications on File Prior to Visit  Medication Sig Dispense Refill  . acetaminophen (TYLENOL) 500 MG tablet Take 1,000 mg by mouth every 6 (six) hours as needed (for pain or headaches).     . bisacodyl (DULCOLAX) 5 MG EC tablet Take 1 tablet (5 mg total) by mouth daily as needed for moderate constipation. (Patient not taking: Reported on 04/05/2018) 30 tablet 0  . capsaicin (ZOSTRIX) 0.025 % cream Apply topically 2 (two) times daily as needed (pain). 60 g 0  . clotrimazole (LOTRIMIN) 1 % cream Apply 1 application topically 2 (two) times daily. (Patient not taking: Reported on 04/05/2018) 60 g 0  . diphenhydrAMINE (BENADRYL ALLERGY) 25 mg capsule Take 1 capsule (25 mg total) by mouth every 6 (six) hours as needed. 30 capsule 0  . Dulaglutide 1.5 MG/0.5ML SOPN Inject 1.5 mg into the skin once a week. 4 pen 11  . DULoxetine (CYMBALTA) 30 MG capsule Take 1 capsule (30 mg total) by mouth daily. 30 capsule 2  . Elastic Bandages & Supports (MEDICAL COMPRESSION STOCKINGS) MISC 1 Package by Does not apply route daily. (Patient not taking: Reported on 04/05/2018) 1 each 0  . FLUoxetine (PROZAC) 20 MG capsule Take 20 mg by mouth daily.    . furosemide (LASIX) 40 MG tablet Take 1 tablet (40 mg total) by mouth 2 (two) times daily. 30 tablet 0  . hydrOXYzine (ATARAX/VISTARIL) 25 MG tablet Take 1 tablet (25 mg total) by mouth 3 (three) times daily as needed. 60 tablet 0  . polyvinyl alcohol  (LIQUIFILM TEARS) 1.4 % ophthalmic solution Place 2 drops into both eyes 4 (four) times daily. (Patient not taking: Reported on 04/05/2018) 15 mL 0  . sertraline (ZOLOFT) 50 MG tablet Take 1 tablet (50 mg total) by mouth daily. 30 tablet 0   No current facility-administered medications on file prior to visit.    Past Medical History:  Diagnosis Date  . Diabetes mellitus without complication ([place])   . Edema of both legs   . Grief 09/08/2017  . Hypertension   . Neuropathy    Vitals:   05/11/18 1527  BP: (!) 160/82  Pulse: 86  Temp: 98.3 F (36.8 C)  TempSrc: Oral  SpO2: 98%  Weight: 135 lb 3.2 oz (61.3 kg)  Height: 5\' 4"  (1.626 m)     Review of Systems  Skin: Positive for rash.       Itchy skin  All other systems reviewed and are negative.      Objective:   Physical Exam Vitals signs and nursing note reviewed.  Constitutional:      Appearance: She is not ill-appearing.  Skin:    Comments: Multiple, widespread hyperpigmented round nodular lesions all over her body with some excoriation marks.  Neurological:     Mental Status: She is alert.        Assessment:     Skin rash with itching HTN    Plan:  1. Skin lesion: Differentials include medication induced (less like). We reviewed her med list. Furosemide will cause itching in rare occasions.  Dermatotillomania a possibility,she currently denies anxiety. Given hx of nephrotic syndrome, this could be related to her kidney disease.    I reviewed previous treatment regimens. No response to Scabies treatment, hence less likely.   We will try topical medium potency steroid plus Eucerin cream. Xyzal for itching. D/C Benadryl and Hydroxyzine.  F/U soon if there is no improvement. We can either increase steroid potency then or get skin biopsy.  She agreed with the plan.  2. BP elevated. PCP f/u in 1-2 weeks recommended. She verbalized understanding.

## 2018-05-11 NOTE — Patient Instructions (Addendum)
It was nice seeing you today. I am sorry about your skin rash. We are not clear if this is related to some of the medications you are on or environmental allergy. In some cases of kidney disease, please have severe chronic itching. Please try topical steroid mixed with Eucerin cream for now. Also start taking Xyzal as needed for itching which is the same as Levocetirizine.

## 2018-05-15 ENCOUNTER — Other Ambulatory Visit: Payer: Self-pay | Admitting: *Deleted

## 2018-05-16 MED ORDER — FLUOXETINE HCL 20 MG PO CAPS: 20.0000 mg | ORAL_CAPSULE | Freq: Every day | ORAL | 3 refills | Status: AC

## 2018-06-02 ENCOUNTER — Other Ambulatory Visit: Payer: Self-pay | Admitting: Family Medicine

## 2018-06-07 ENCOUNTER — Ambulatory Visit: Payer: Medicaid Other | Admitting: Family Medicine

## 2018-06-07 NOTE — Progress Notes (Deleted)
   Subjective   Patient ID: Andrea Glenn    DOB: [birth year], 58 y.o. female   MRN: [id number]  CC: "***"  HPI: Andrea Glenn is a 58 y.o. female who presents to clinic today for the following:  ***: ***  ROS: see HPI for pertinent.  [place]: IDDM, recent pancreatitis, tobacco use disorder, RLS, HLD.Surgical shoulder and foot surgery, c-sec, back surgery. FamilyhistoryCKD, DM, thyroid disease, HTN, HF, stroke,cancer(sister). Smoking status reviewed. Medications reviewed.  Objective   There were no vitals taken for this visit. Vitals and nursing note reviewed.  General: well nourished, well developed, NAD with non-toxic appearance HEENT: normocephalic, atraumatic, moist mucous membranes Neck: supple, non-tender without lymphadenopathy Cardiovascular: regular rate and rhythm without murmurs, rubs, or gallops Lungs: clear to auscultation bilaterally with normal work of breathing Abdomen: soft, non-tender, non-distended, normoactive bowel sounds Skin: warm, dry, no rashes or lesions, cap refill < 2 seconds Extremities: warm and well perfused, normal tone, no edema  Assessment & Plan   No problem-specific Assessment & Plan notes found for this encounter.  No orders of the defined types were placed in this encounter.  No orders of the defined types were placed in this encounter.   Harriet [place], [place], PGY-3 06/07/2018, 8:00 AM

## 2018-06-09 ENCOUNTER — Ambulatory Visit (INDEPENDENT_AMBULATORY_CARE_PROVIDER_SITE_OTHER): Payer: Medicaid Other | Admitting: Podiatry

## 2018-06-09 ENCOUNTER — Other Ambulatory Visit: Payer: Self-pay

## 2018-06-09 VITALS — BP 185/95 | HR 85

## 2018-06-09 DIAGNOSIS — M79674 Pain in right toe(s): Secondary | ICD-10-CM

## 2018-06-09 DIAGNOSIS — B351 Tinea unguium: Secondary | ICD-10-CM | POA: Diagnosis not present

## 2018-06-09 DIAGNOSIS — L84 Corns and callosities: Secondary | ICD-10-CM

## 2018-06-09 DIAGNOSIS — E1142 Type 2 diabetes mellitus with diabetic polyneuropathy: Secondary | ICD-10-CM

## 2018-06-09 DIAGNOSIS — M79675 Pain in left toe(s): Secondary | ICD-10-CM | POA: Diagnosis not present

## 2018-06-09 MED ORDER — AMMONIUM LACTATE 12 % EX LOTN: 1.0000 "application " | TOPICAL_LOTION | Freq: Every day | CUTANEOUS | 0 refills | Status: AC

## 2018-06-09 NOTE — Patient Instructions (Signed)
Onychomycosis/Fungal Toenails  WHAT IS IT? An infection that lies within the keratin of your nail plate that is caused by a fungus.  WHY ME? Fungal infections affect all ages, sexes, races, and creeds.  There may be many factors that predispose you to a fungal infection such as age, coexisting medical conditions such as diabetes, or an autoimmune disease; stress, medications, fatigue, genetics, etc.  Bottom line: fungus thrives in a warm, moist environment and your shoes offer such a location.  IS IT CONTAGIOUS? Theoretically, yes.  You do not want to share shoes, nail clippers or files with someone who has fungal toenails.  Walking around barefoot in the same room or sleeping in the same bed is unlikely to transfer the organism.  It is important to realize, however, that fungus can spread easily from one nail to the next on the same foot.  HOW DO WE TREAT THIS?  There are several ways to treat this condition.  Treatment may depend on many factors such as age, medications, pregnancy, liver and kidney conditions, etc.  It is best to ask your doctor which options are available to you.  1. No treatment.   Unlike many other medical concerns, you can live with this condition.  However for many people this can be a painful condition and may lead to ingrown toenails or a bacterial infection.  It is recommended that you keep the nails cut short to help reduce the amount of fungal nail. 2. Topical treatment.  These range from herbal remedies to prescription strength nail lacquers.  About 40-50% effective, topicals require twice daily application for approximately 9 to 12 months or until an entirely new nail has grown out.  The most effective topicals are medical grade medications available through physicians offices. 3. Oral antifungal medications.  With an 80-90% cure rate, the most common oral medication requires 3 to 4 months of therapy and stays in your system for a year as the new nail grows out.  Oral  antifungal medications do require blood work to make sure it is a safe drug for you.  A liver function panel will be performed prior to starting the medication and after the first month of treatment.  It is important to have the blood work performed to avoid any harmful side effects.  In general, this medication safe but blood work is required. 4. Laser Therapy.  This treatment is performed by applying a specialized laser to the affected nail plate.  This therapy is noninvasive, fast, and non-painful.  It is not covered by insurance and is therefore, out of pocket.  The results have been very good with a 80-95% cure rate.  The [facility] is the only practice in the area to offer this therapy. Permanent Nail Avulsion.  Removing the entire nail so that a new nail will not grow back.Corns and Calluses Corns are small areas of thickened skin that occur on the top, sides, or tip of a toe. They contain a cone-shaped core with a point that can press on a nerve below. This causes pain.  Calluses are areas of thickened skin that can occur anywhere on the body, including the hands, fingers, palms, soles of the feet, and heels. Calluses are usually larger than corns. What are the causes? Corns and calluses are caused by rubbing (friction) or pressure, such as from shoes that are too tight or do not fit properly. What increases the risk? Corns are more likely to develop in people who have misshapen   toes (toe deformities), such as hammer toes. Calluses can occur with friction to any area of the skin. They are more likely to develop in people who:  Work with their hands.  Wear shoes that fit poorly, are too tight, or are high-heeled.  Have toe deformities. What are the signs or symptoms? Symptoms of a corn or callus include:  A hard growth on the skin.  Pain or tenderness under the skin.  Redness and swelling.  Increased discomfort while wearing tight-fitting shoes, if your feet are affected. If a  corn or callus becomes infected, symptoms may include:  Redness and swelling that gets worse.  Pain.  Fluid, blood, or pus draining from the corn or callus. How is this diagnosed? Corns and calluses may be diagnosed based on your symptoms, your medical history, and a physical exam. How is this treated? Treatment for corns and calluses may include:  Removing the cause of the friction or pressure. This may involve: ? Changing your shoes. ? Wearing shoe inserts (orthotics) or other protective layers in your shoes, such as a corn pad. ? Wearing gloves.  Applying medicine to the skin (topical medicine) to help soften skin in the hardened, thickened areas.  Removing layers of dead skin with a file to reduce the size of the corn or callus.  Removing the corn or callus with a scalpel or laser.  Taking antibiotic medicines, if your corn or callus is infected.  Having surgery, if a toe deformity is the cause. Follow these instructions at home:   Take over-the-counter and prescription medicines only as told by your health care provider.  If you were prescribed an antibiotic, take it as told by your health care provider. Do not stop taking it even if your condition starts to improve.  Wear shoes that fit well. Avoid wearing high-heeled shoes and shoes that are too tight or too loose.  Wear any padding, protective layers, gloves, or orthotics as told by your health care provider.  Soak your hands or feet and then use a file or pumice stone to soften your corn or callus. Do this as told by your health care provider.  Check your corn or callus every day for symptoms of infection. Contact a health care provider if you:  Notice that your symptoms do not improve with treatment.  Have redness or swelling that gets worse.  Notice that your corn or callus becomes painful.  Have fluid, blood, or pus coming from your corn or callus.  Have new symptoms. Summary  Corns are small areas of  thickened skin that occur on the top, sides, or tip of a toe.  Calluses are areas of thickened skin that can occur anywhere on the body, including the hands, fingers, palms, and soles of the feet. Calluses are usually larger than corns.  Corns and calluses are caused by rubbing (friction) or pressure, such as from shoes that are too tight or do not fit properly.  Treatment may include wearing any padding, protective layers, gloves, or orthotics as told by your health care provider. This information is not intended to replace advice given to you by your health care provider. Make sure you discuss any questions you have with your health care provider. Document Released: 01/10/2004 Document Revised: 02/16/2017 Document Reviewed: 02/16/2017 Elsevier Interactive Patient Education  2019 [employer] Diabetes Mellitus and Foot Care Foot care is an important part of your health, especially when you have diabetes. Diabetes may cause you to have problems because of poor   blood flow (circulation) to your feet and legs, which can cause your skin to:  Become thinner and drier.  Break more easily.  Heal more slowly.  Peel and crack. You may also have nerve damage (neuropathy) in your legs and feet, causing decreased feeling in them. This means that you may not notice minor injuries to your feet that could lead to more serious problems. Noticing and addressing any potential problems early is the best way to prevent future foot problems. How to care for your feet Foot hygiene  Wash your feet daily with warm water and mild soap. Do not use hot water. Then, pat your feet and the areas between your toes until they are completely dry. Do not soak your feet as this can dry your skin.  Trim your toenails straight across. Do not dig under them or around the cuticle. File the edges of your nails with an emery board or nail file.  Apply a moisturizing lotion or petroleum jelly to the skin on your feet and to  dry, brittle toenails. Use lotion that does not contain alcohol and is unscented. Do not apply lotion between your toes. Shoes and socks  Wear clean socks or stockings every day. Make sure they are not too tight. Do not wear knee-high stockings since they may decrease blood flow to your legs.  Wear shoes that fit properly and have enough cushioning. Always look in your shoes before you put them on to be sure there are no objects inside.  To break in new shoes, wear them for just a few hours a day. This prevents injuries on your feet. Wounds, scrapes, corns, and calluses  Check your feet daily for blisters, cuts, bruises, sores, and redness. If you cannot see the bottom of your feet, use a mirror or ask someone for help.  Do not cut corns or calluses or try to remove them with medicine.  If you find a minor scrape, cut, or break in the skin on your feet, keep it and the skin around it clean and dry. You may clean these areas with mild soap and water. Do not clean the area with peroxide, alcohol, or iodine.  If you have a wound, scrape, corn, or callus on your foot, look at it several times a day to make sure it is healing and not infected. Check for: ? Redness, swelling, or pain. ? Fluid or blood. ? Warmth. ? Pus or a bad smell. General instructions  Do not cross your legs. This may decrease blood flow to your feet.  Do not use heating pads or hot water bottles on your feet. They may burn your skin. If you have lost feeling in your feet or legs, you may not know this is happening until it is too late.  Protect your feet from hot and cold by wearing shoes, such as at the beach or on hot pavement.  Schedule a complete foot exam at least once a year (annually) or more often if you have foot problems. If you have foot problems, report any cuts, sores, or bruises to your health care provider immediately. Contact a health care provider if:  You have a medical condition that increases your  risk of infection and you have any cuts, sores, or bruises on your feet.  You have an injury that is not healing.  You have redness on your legs or feet.  You feel burning or tingling in your legs or feet.  You have pain or cramps in   your legs and feet.  Your legs or feet are numb.  Your feet always feel cold.  You have pain around a toenail. Get help right away if:  You have a wound, scrape, corn, or callus on your foot and: ? You have pain, swelling, or redness that gets worse. ? You have fluid or blood coming from the wound, scrape, corn, or callus. ? Your wound, scrape, corn, or callus feels warm to the touch. ? You have pus or a bad smell coming from the wound, scrape, corn, or callus. ? You have a fever. ? You have a red line going up your leg. Summary  Check your feet every day for cuts, sores, red spots, swelling, and blisters.  Moisturize feet and legs daily.  Wear shoes that fit properly and have enough cushioning.  If you have foot problems, report any cuts, sores, or bruises to your health care provider immediately.  Schedule a complete foot exam at least once a year (annually) or more often if you have foot problems. This information is not intended to replace advice given to you by your health care provider. Make sure you discuss any questions you have with your health care provider. Document Released: 04/02/2000 Document Revised: 05/18/2017 Document Reviewed: 05/07/2016 Elsevier Interactive Patient Education  2019 Elsevier Inc.  

## 2018-06-16 ENCOUNTER — Encounter (HOSPITAL_COMMUNITY): Payer: Self-pay

## 2018-06-16 ENCOUNTER — Other Ambulatory Visit: Payer: Self-pay

## 2018-06-16 ENCOUNTER — Observation Stay (HOSPITAL_COMMUNITY)
Admission: EM | Admit: 2018-06-16 | Discharge: 2018-06-17 | Disposition: A | Discharge status: Home / Self Care | Payer: Medicaid Other | Attending: Internal Medicine | Admitting: Internal Medicine

## 2018-06-16 DIAGNOSIS — Z888 Allergy status to other drugs, medicaments and biological substances status: Secondary | ICD-10-CM | POA: Insufficient documentation

## 2018-06-16 DIAGNOSIS — I129 Hypertensive chronic kidney disease with stage 1 through stage 4 chronic kidney disease, or unspecified chronic kidney disease: Secondary | ICD-10-CM | POA: Diagnosis not present

## 2018-06-16 DIAGNOSIS — E1129 Type 2 diabetes mellitus with other diabetic kidney complication: Secondary | ICD-10-CM | POA: Diagnosis present

## 2018-06-16 DIAGNOSIS — Z79899 Other long term (current) drug therapy: Secondary | ICD-10-CM | POA: Insufficient documentation

## 2018-06-16 DIAGNOSIS — E1121 Type 2 diabetes mellitus with diabetic nephropathy: Secondary | ICD-10-CM | POA: Diagnosis not present

## 2018-06-16 DIAGNOSIS — D649 Anemia, unspecified: Secondary | ICD-10-CM | POA: Diagnosis present

## 2018-06-16 DIAGNOSIS — F329 Major depressive disorder, single episode, unspecified: Secondary | ICD-10-CM | POA: Insufficient documentation

## 2018-06-16 DIAGNOSIS — E1122 Type 2 diabetes mellitus with diabetic chronic kidney disease: Secondary | ICD-10-CM | POA: Insufficient documentation

## 2018-06-16 DIAGNOSIS — I10 Essential (primary) hypertension: Secondary | ICD-10-CM | POA: Diagnosis not present

## 2018-06-16 DIAGNOSIS — N184 Chronic kidney disease, stage 4 (severe): Secondary | ICD-10-CM | POA: Insufficient documentation

## 2018-06-16 DIAGNOSIS — D638 Anemia in other chronic diseases classified elsewhere: Secondary | ICD-10-CM | POA: Diagnosis not present

## 2018-06-16 LAB — BASIC METABOLIC PANEL
Anion gap: 4 — ABNORMAL LOW (ref 5–15)
BUN: 29 mg/dL — ABNORMAL HIGH (ref 6–20)
CO2: 22 mmol/L (ref 22–32)
Calcium: 8.4 mg/dL — ABNORMAL LOW (ref 8.9–10.3)
Chloride: 114 mmol/L — ABNORMAL HIGH (ref 98–111)
Creatinine, Ser: 2.06 mg/dL — ABNORMAL HIGH (ref 0.44–1.00)
GFR calc Af Amer: 30 mL/min — ABNORMAL LOW (ref >60)
GFR calc non Af Amer: 26 mL/min — ABNORMAL LOW (ref >60)
Glucose, Bld: 128 mg/dL — ABNORMAL HIGH (ref 70–99)
Potassium: 4.4 mmol/L (ref 3.5–5.1)
Sodium: 140 mmol/L (ref 135–145)

## 2018-06-16 LAB — CBC WITH DIFFERENTIAL/PLATELET
Abs Immature Granulocytes: 0.02 10*3/uL (ref 0.00–0.07)
Basophils Absolute: 0.1 10*3/uL (ref 0.0–0.1)
Basophils Relative: 1 %
Eosinophils Absolute: 0.1 10*3/uL (ref 0.0–0.5)
Eosinophils Relative: 2 %
HCT: 21.7 % — ABNORMAL LOW (ref 36.0–46.0)
Hemoglobin: 6.4 g/dL — CL (ref 12.0–15.0)
Immature Granulocytes: 0 %
Lymphocytes Relative: 32 %
Lymphs Abs: 2.7 10*3/uL (ref 0.7–4.0)
MCH: 28.6 pg (ref 26.0–34.0)
MCHC: 29.5 g/dL — ABNORMAL LOW (ref 30.0–36.0)
MCV: 96.9 fL (ref 80.0–100.0)
Monocytes Absolute: 0.5 10*3/uL (ref 0.1–1.0)
Monocytes Relative: 6 %
Neutro Abs: 5 10*3/uL (ref 1.7–7.7)
Neutrophils Relative %: 59 %
Platelets: 169 10*3/uL (ref 150–400)
RBC: 2.24 MIL/uL — ABNORMAL LOW (ref 3.87–5.11)
RDW: 17.3 % — ABNORMAL HIGH (ref 11.5–15.5)
WBC: 8.4 10*3/uL (ref 4.0–10.5)
nRBC: 0 % (ref 0.0–0.2)

## 2018-06-16 LAB — GLUCOSE, CAPILLARY: Glucose-Capillary: 182 mg/dL — ABNORMAL HIGH (ref 70–99)

## 2018-06-16 LAB — PREPARE RBC (CROSSMATCH)

## 2018-06-16 MED ORDER — FLUOXETINE HCL 20 MG PO CAPS
20.0000 mg | ORAL_CAPSULE | Freq: Every day | ORAL | Status: DC
  Filled 2018-06-16: qty 1

## 2018-06-16 MED ORDER — FUROSEMIDE 10 MG/ML IJ SOLN
20.0000 mg | Freq: Once | INTRAMUSCULAR | Status: AC
  Administered 2018-06-16: 20 mg via INTRAVENOUS
  Filled 2018-06-16: qty 4

## 2018-06-16 MED ORDER — ONDANSETRON HCL 4 MG/2ML IJ SOLN: 4.0000 mg | Freq: Four times a day (QID) | INTRAMUSCULAR | Status: DC | PRN

## 2018-06-16 MED ORDER — LEVOCETIRIZINE DIHYDROCHLORIDE 5 MG PO TABS: 5.0000 mg | ORAL_TABLET | Freq: Every evening | ORAL | Status: DC

## 2018-06-16 MED ORDER — HEPARIN SODIUM (PORCINE) 5000 UNIT/ML IJ SOLN
5000.0000 [IU] | Freq: Three times a day (TID) | INTRAMUSCULAR | Status: DC
  Administered 2018-06-16 – 2018-06-17 (×2): 5000 [IU] via SUBCUTANEOUS
  Filled 2018-06-16 (×2): qty 1

## 2018-06-16 MED ORDER — AMLODIPINE BESYLATE 5 MG PO TABS
2.5000 mg | ORAL_TABLET | Freq: Every day | ORAL | Status: DC
  Administered 2018-06-16: 2.5 mg via ORAL
  Filled 2018-06-16: qty 1

## 2018-06-16 MED ORDER — FUROSEMIDE 40 MG PO TABS
60.0000 mg | ORAL_TABLET | Freq: Two times a day (BID) | ORAL | Status: DC
  Administered 2018-06-16 – 2018-06-17 (×2): 60 mg via ORAL
  Filled 2018-06-16 (×2): qty 1

## 2018-06-16 MED ORDER — HYDRALAZINE HCL 20 MG/ML IJ SOLN
20.0000 mg | Freq: Once | INTRAMUSCULAR | Status: AC
  Administered 2018-06-16: 20 mg via INTRAVENOUS
  Filled 2018-06-16: qty 1

## 2018-06-16 MED ORDER — FUROSEMIDE 40 MG PO TABS: 40.0000 mg | ORAL_TABLET | Freq: Two times a day (BID) | ORAL | Status: DC

## 2018-06-16 MED ORDER — SERTRALINE HCL 50 MG PO TABS
50.0000 mg | ORAL_TABLET | Freq: Every day | ORAL | Status: DC
  Filled 2018-06-16: qty 1

## 2018-06-16 MED ORDER — ACETAMINOPHEN 650 MG RE SUPP: 650.0000 mg | Freq: Four times a day (QID) | RECTAL | Status: DC | PRN

## 2018-06-16 MED ORDER — HYDRALAZINE HCL 20 MG/ML IJ SOLN
5.0000 mg | INTRAMUSCULAR | Status: DC | PRN
  Administered 2018-06-17: 5 mg via INTRAVENOUS
  Filled 2018-06-16: qty 1

## 2018-06-16 MED ORDER — INSULIN ASPART 100 UNIT/ML ~~LOC~~ SOLN: 0.0000 [IU] | Freq: Three times a day (TID) | SUBCUTANEOUS | Status: DC

## 2018-06-16 MED ORDER — ACETAMINOPHEN 325 MG PO TABS
650.0000 mg | ORAL_TABLET | Freq: Four times a day (QID) | ORAL | Status: DC | PRN
  Administered 2018-06-16 – 2018-06-17 (×2): 650 mg via ORAL
  Filled 2018-06-16 (×2): qty 2

## 2018-06-16 MED ORDER — ONDANSETRON HCL 4 MG PO TABS: 4.0000 mg | ORAL_TABLET | Freq: Four times a day (QID) | ORAL | Status: DC | PRN

## 2018-06-16 MED ORDER — TACROLIMUS 1 MG PO CAPS
1.0000 mg | ORAL_CAPSULE | Freq: Two times a day (BID) | ORAL | Status: DC
  Administered 2018-06-16 – 2018-06-17 (×2): 1 mg via ORAL
  Filled 2018-06-16 (×3): qty 1

## 2018-06-16 MED ORDER — LORATADINE 10 MG PO TABS
10.0000 mg | ORAL_TABLET | Freq: Every evening | ORAL | Status: DC
  Administered 2018-06-16: 10 mg via ORAL
  Filled 2018-06-16: qty 1

## 2018-06-16 MED ORDER — SODIUM CHLORIDE 0.9% IV SOLUTION: Freq: Once | INTRAVENOUS | Status: DC

## 2018-06-16 MED ORDER — HYDROXYZINE HCL 25 MG PO TABS: 25.0000 mg | ORAL_TABLET | Freq: Three times a day (TID) | ORAL | Status: DC | PRN

## 2018-06-16 NOTE — ED Provider Notes (Addendum)
Patient accepted at signout from Dr. Wilson Singer.  Patient is seen in the emergency department for symptomatic anemia.  She is referred by her nephrologist for transfusion.  Patient has rare antibody match.  First unit is available but second unit will need to be brought from [place].  Patient reports she still feels very fatigued and weak.  Will plan for admission.  Also, patient's blood pressure is significantly elevated.  She does describe having been off of medications and only restarted yesterday on amlodipine.  Patient did not really consider self to be hypertensive per se and believes that her hypertension started about a year ago when she got started on her diabetic medications.  She does report mild headache.  She does not have visual changes or chest pain.  At this time with complex situation of steadily elevating blood pressures and anemia, will plan for admission for management. Physical Exam  BP (!) 213/87   Pulse 79   Temp 98.3 F (36.8 C)   Resp 18   Ht 5\' 4"  (1.626 m)   Wt 61.2 kg   SpO2 100%   BMI 23.17 kg/m   Physical Exam Patient is alert and nontoxic.  Mental status is clear.  No respiratory distress.  Rectal: No stool in the vault.  No melena. ED Course/Procedures     Procedures Consult: Dr. Hal Hope for admission.  CRITICAL CARE Performed by: Charlesetta Shanks   Total critical care time: 20 minutes  Critical care time was exclusive of separately billable procedures and treating other patients.  Critical care was necessary to treat or prevent imminent or life-threatening deterioration.  Critical care was time spent personally by me on the following activities: development of treatment plan with patient and/or surrogate as well as nursing, discussions with consultants, evaluation of patient's response to treatment, examination of patient, obtaining history from patient or surrogate, ordering and performing treatments and interventions, ordering and review of laboratory  studies, ordering and review of radiographic studies, pulse oximetry and re-evaluation of patient's condition. MDM  Will admit for complex symptomatic anemia and hypertensive urgency.       Charlesetta Shanks, MD 06/16/18 1944    Charlesetta Shanks, MD 06/16/18 Marjo Bicker    Charlesetta Shanks, MD 06/16/18 [phone]

## 2018-06-16 NOTE — H&P (Signed)
History and Physical    FLORELLA Glenn BSJ:[id number] DOB: 11-25-1960 DOA: 06/16/2018  PCP: [place] Bing, DO  Patient coming from: Home.  Chief Complaint: Low hemoglobin.  HPI: Andrea Glenn is a 58 y.o. female with history of chronic kidney disease stage IV with nephrotic syndrome, hypertension, anemia was referred to the ER for blood transfusion by patient's nephrologist after patient's routine blood work showed hemoglobin had decreased from December 2019.  Patient denies seeing any blood in the stools or vomiting blood.  Has been feeling weak at times dizzy.  Gets short of breath also.  Patient blood pressure has been increasing recently and at her nephrologist office it was more than 765 systolic per the patient.  Patient was restarted on amlodipine 2.5 and her Lasix dose was increased also.  ED Course: In the ER labs done showed hemoglobin of 6.4 which was a drop from 8.7  2 months ago.  2 units of PRBC transfusion has been ordered.  IV hydralazine was given for blood pressure.  Review of Systems: As per HPI, rest all negative.   Past Medical History:  Diagnosis Date  . Diabetes mellitus without complication ([place])   . Edema of both legs   . Grief 09/08/2017  . Hypertension   . Neuropathy     Past Surgical History:  Procedure Laterality Date  . BACK SURGERY    . CESAREAN SECTION    . FOOT SURGERY    . SHOULDER SURGERY       reports that she has been smoking cigarettes. She started smoking about 41 years ago. She has a 2.00 pack-year smoking history. She has never used smokeless tobacco. She reports that she does not drink alcohol or use drugs.  Allergies  Allergen Reactions  . Atorvastatin Other (See Comments)    Suicidal thoughts  . Statins Other (See Comments)    This class of meds causes "suicidal thoughts"  . Amitriptyline Other (See Comments)    Depression  . Metformin And Related Diarrhea  . Adhesive [Tape] Other (See Comments)    "Sometimes peels off  my skin"  . Gabapentin Other (See Comments)    "Gives me the shakes"    Family History  Problem Relation Age of Onset  . Kidney failure Mother   . Diabetes Mother   . Thyroid disease Mother   . Hypertension Mother   . Heart failure Mother   . Kidney failure Father   . Cancer Sister   . Stroke Brother   . Cancer Other     Prior to Admission medications   Medication Sig Start Date End Date Taking? Authorizing Provider  acetaminophen (TYLENOL) 500 MG tablet Take 1,000 mg by mouth every 6 (six) hours as needed (for pain or headaches).    Yes [provider]  ammonium lactate (AMLACTIN) 12 % lotion Apply 1 application topically daily. Apply to both feet daily 06/09/18  Yes [place], Stephani Police, DPM  Dulaglutide 1.5 MG/0.5ML SOPN Inject 1.5 mg into the skin once a week. 11/02/17  Yes Hensel, Jamal Collin, MD  FLUoxetine (PROZAC) 20 MG capsule Take 1 capsule (20 mg total) by mouth daily. 05/16/18  Yes [place] Bing, DO  furosemide (LASIX) 40 MG tablet Take 1 tablet (40 mg total) by mouth 2 (two) times daily. 04/10/18  Yes Lovenia Kim, MD  hydrOXYzine (ATARAX/VISTARIL) 25 MG tablet Take 1 tablet (25 mg total) by mouth 3 (three) times daily as needed. Patient taking differently: Take 25 mg by  mouth 3 (three) times daily as needed for itching.  01/16/18  Yes Bland, Scott, DO  levocetirizine (XYZAL) 5 MG tablet TAKE 1 TABLET (5 MG TOTAL) BY MOUTH EVERY EVENING. DO NOT USE WITH BENADRYL 06/02/18  Yes [place] Bing, DO  sertraline (ZOLOFT) 50 MG tablet Take 1 tablet (50 mg total) by mouth daily. 03/21/18  Yes Anderson, Chelsey L, DO  tacrolimus (PROGRAF) 1 MG capsule Take 1 mg by mouth 2 (two) times daily. 05/10/18  Yes [provider]  bisacodyl (DULCOLAX) 5 MG EC tablet Take 1 tablet (5 mg total) by mouth daily as needed for moderate constipation. Patient not taking: Reported on 04/05/2018 07/25/17   Patrecia Pour, Christean Grief, MD  capsaicin (ZOSTRIX) 0.025 % cream Apply topically 2  (two) times daily as needed (pain). Patient not taking: Reported on 06/16/2018 04/13/18   Marjie Skiff, MD  clotrimazole (LOTRIMIN) 1 % cream Apply 1 application topically 2 (two) times daily. Patient not taking: Reported on 04/05/2018 10/07/17   Tonette Bihari, MD  diphenhydrAMINE (BENADRYL ALLERGY) 25 mg capsule Take 1 capsule (25 mg total) by mouth every 6 (six) hours as needed. Patient not taking: Reported on 06/16/2018 03/21/18   Doristine Mango L, DO  DULoxetine (CYMBALTA) 30 MG capsule Take 1 capsule (30 mg total) by mouth daily. Patient not taking: Reported on 06/16/2018 11/10/17   Zenia Resides, MD  Elastic Bandages & Supports (MEDICAL COMPRESSION STOCKINGS) [place] 1 Package by Does not apply route daily. Patient not taking: Reported on 04/05/2018 03/21/18   Doristine Mango L, DO  polyvinyl alcohol (LIQUIFILM TEARS) 1.4 % ophthalmic solution Place 2 drops into both eyes 4 (four) times daily. Patient not taking: Reported on 04/05/2018 09/12/17   Lovenia Kim, MD  triamcinolone cream (KENALOG) 0.1 % Apply 1 application topically 2 (two) times daily. Patient not taking: Reported on 06/16/2018 05/11/18   Kinnie Feil, MD    Physical Exam: Vitals:   06/16/18 2020 06/16/18 2029 06/16/18 2038 06/16/18 2042  BP:      Pulse: 93 94 96 96  Resp: 15 15 20 17   Temp:      TempSrc:      SpO2: 99% 100% 98% 99%  Weight:      Height:          Constitutional: Moderately built and nourished. Vitals:   06/16/18 2020 06/16/18 2029 06/16/18 2038 06/16/18 2042  BP:      Pulse: 93 94 96 96  Resp: 15 15 20 17   Temp:      TempSrc:      SpO2: 99% 100% 98% 99%  Weight:      Height:       Eyes: Anicteric no pallor. ENMT: No discharge from the ears eyes nose or mouth. Neck: No mass or.  No neck rigidity. Respiratory: No rhonchi or crepitations. Cardiovascular: S1-S2 heard. Abdomen: Soft nontender bowel sounds are seen. Musculoskeletal: No edema.  No joint effusion. Skin: No  rash. Neurologic: Alert awake oriented to time place and person.  Moves all extremities. Psychiatric: Appears normal.  Normal affect.   Labs on Admission: I have personally reviewed following labs and imaging studies  CBC: Recent Labs  Lab 06/16/18 1019  WBC 8.4  NEUTROABS 5.0  HGB 6.4*  HCT 21.7*  MCV 96.9  PLT 025   Basic Metabolic Panel: Recent Labs  Lab 06/16/18 1019  NA 140  K 4.4  CL 114*  CO2 22  GLUCOSE 128*  BUN 29*  CREATININE 2.06*  CALCIUM 8.4*   GFR: Estimated Creatinine Clearance: 25.7 mL/min (A) (by C-G formula based on SCr of 2.06 mg/dL (H)). Liver Function Tests: No results for input(s): AST, ALT, ALKPHOS, BILITOT, PROT, ALBUMIN in the last 168 hours. No results for input(s): LIPASE, AMYLASE in the last 168 hours. No results for input(s): AMMONIA in the last 168 hours. Coagulation Profile: No results for input(s): INR, PROTIME in the last 168 hours. Cardiac Enzymes: No results for input(s): CKTOTAL, CKMB, CKMBINDEX, TROPONINI in the last 168 hours. BNP (last 3 results) No results for input(s): PROBNP in the last 8760 hours. HbA1C: No results for input(s): HGBA1C in the last 72 hours. CBG: No results for input(s): GLUCAP in the last 168 hours. Lipid Profile: No results for input(s): CHOL, HDL, LDLCALC, TRIG, CHOLHDL, LDLDIRECT in the last 72 hours. Thyroid Function Tests: No results for input(s): TSH, T4TOTAL, FREET4, T3FREE, THYROIDAB in the last 72 hours. Anemia Panel: No results for input(s): VITAMINB12, FOLATE, FERRITIN, TIBC, IRON, RETICCTPCT in the last 72 hours. Urine analysis:    Component Value Date/Time   COLORURINE YELLOW 04/06/2018 0703   APPEARANCEUR CLOUDY (A) 04/06/2018 0703   LABSPEC 1.015 04/06/2018 0703   PHURINE 8.0 04/06/2018 0703   GLUCOSEU 150 (A) 04/06/2018 0703   HGBUR NEGATIVE 04/06/2018 0703   BILIRUBINUR NEGATIVE 04/06/2018 0703   BILIRUBINUR negative 08/17/2017 0939   KETONESUR NEGATIVE 04/06/2018 0703    PROTEINUR >=300 (A) 04/06/2018 0703   UROBILINOGEN 0.2 08/17/2017 0939   UROBILINOGEN 0.2 07/28/2013 0008   NITRITE POSITIVE (A) 04/06/2018 0703   LEUKOCYTESUR TRACE (A) 04/06/2018 0703   Sepsis Labs: @LABRCNTIP (procalcitonin:4,lacticidven:4) )No results found for this or any previous visit (from the past 240 hour(s)).   Radiological Exams on Admission: No results found.  Assessment/Plan Principal Problem:   Symptomatic anemia Active Problems:   DM (diabetes mellitus), type 2 with renal complications (H[facility])   Essential hypertension    1. Symptomatic anemia -anemia likely from chronic kidney disease.  2 units of packed red blood cell transfusion has been ordered follow CBC. 2. Hypertension uncontrolled has been restarted on amlodipine 2.5 and Lasix dose was increased by patient's nephrologist yesterday.  PRN IV hydralazine.  Follow blood pressure trends. 3. Chronic kidney disease stage IV with nephrotic syndrome being followed by nephrologist.  On Lasix.  Patient also takes Prograf. 4. Diabetes mellitus type 2 we will keep patient on sliding scale coverage while inpatient. 5. Depression on Zoloft and Prozac.   DVT prophylaxis: Heparin. Code Status: Full code. Family Communication: Discussed with patient. Disposition Plan: Home. Consults called: None. Admission status: Observation.   Rise Patience MD [facility] Pager [phone].  If 7PM-7AM, please contact night-coverage [email or web address] Password [facility]  06/16/2018, 8:46 PM

## 2018-06-16 NOTE — ED Provider Notes (Signed)
[facility] DEPT Provider Note   CSN: [id number] Arrival date & time: 06/16/18  3716    History   Chief Complaint Chief Complaint  Patient presents with  . Abnormal Lab    HPI BRIENNA Glenn is a 58 y.o. female.  HPI   1yF with anemia. Hx of the same. She says she had recent nephrology appointment and then called back and told hemoglobin was 6.6. Denies BRBPR or melena. Has been compliant with meds. Says she feels a little more fatigues and cold than usual. LE edema has been improving. No acute respiratory complaints.   Past Medical History:  Diagnosis Date  . Diabetes mellitus without complication (Andrea Glenn)   . Edema of both legs   . Grief 09/08/2017  . Hypertension   . Neuropathy     Patient Active Problem List   Diagnosis Date Noted  . Nephrotic syndrome   . Symptomatic anemia 04/05/2018  . AKI (acute kidney injury) (Andrea Glenn)   . Leg swelling   . Diabetes due to underlying condition w diabetic nephropathy (Andrea Glenn)   . Pruritus 01/03/2018  . Scabies 12/15/2017  . Solitary pulmonary nodule 10/10/2017  . Depression 09/26/2017  . Urinary tract infection without hematuria   . Postherpetic neuralgia 09/08/2017  . Tremor   . Frequent falls 07/22/2017  . Dehydration 07/22/2017  . CKD (chronic kidney disease) 07/08/2017  . Anemia of chronic disease 06/21/2017  . Primary hypertension 02/03/2017  . Hematuria 01/20/2017  . Dysuria 01/20/2017  . Lower extremity edema 12/30/2016  . Pancreatitis 10/08/2016  . Hyperlipidemia associated with type 2 diabetes mellitus (Andrea Glenn) 10/26/2013  . Restless leg syndrome 03/03/2010  . Uncontrolled type 2 diabetes mellitus with peripheral neuropathy (Andrea Glenn) 06/16/2006  . Tobacco use disorder 06/16/2006    Past Surgical History:  Procedure Laterality Date  . BACK SURGERY    . CESAREAN SECTION    . FOOT SURGERY    . SHOULDER SURGERY       OB History   No obstetric history on file.      Home Medications      Prior to Admission medications   Medication Sig Start Date End Date Taking? Authorizing Provider  acetaminophen (TYLENOL) 500 MG tablet Take 1,000 mg by mouth every 6 (six) hours as needed (for pain or headaches).     [provider]  ammonium lactate (AMLACTIN) 12 % lotion Apply 1 application topically daily. Apply to both feet daily 06/09/18   Marzetta Board, DPM  bisacodyl (DULCOLAX) 5 MG EC tablet Take 1 tablet (5 mg total) by mouth daily as needed for moderate constipation. Patient not taking: Reported on 04/05/2018 07/25/17   Patrecia Pour, Christean Grief, MD  capsaicin (ZOSTRIX) 0.025 % cream Apply topically 2 (two) times daily as needed (pain). 04/13/18   Diallo, Earna Coder, MD  clotrimazole (LOTRIMIN) 1 % cream Apply 1 application topically 2 (two) times daily. Patient not taking: Reported on 04/05/2018 10/07/17   Tonette Bihari, MD  diphenhydrAMINE (BENADRYL ALLERGY) 25 mg capsule Take 1 capsule (25 mg total) by mouth every 6 (six) hours as needed. 03/21/18   Anderson, Chelsey L, DO  Dulaglutide 1.5 MG/0.5ML SOPN Inject 1.5 mg into the skin once a week. 11/02/17   Zenia Resides, MD  DULoxetine (CYMBALTA) 30 MG capsule Take 1 capsule (30 mg total) by mouth daily. 11/10/17   Zenia Resides, MD  Elastic Bandages & Supports (MEDICAL COMPRESSION STOCKINGS) [place] 1 Package by Does not apply route daily. Patient not  taking: Reported on 04/05/2018 03/21/18   Doristine Mango L, DO  FLUoxetine (PROZAC) 20 MG capsule Take 1 capsule (20 mg total) by mouth daily. 05/16/18   [place] Bing, DO  furosemide (LASIX) 40 MG tablet Take 1 tablet (40 mg total) by mouth 2 (two) times daily. 04/10/18   Lovenia Kim, MD  hydrOXYzine (ATARAX/VISTARIL) 25 MG tablet Take 1 tablet (25 mg total) by mouth 3 (three) times daily as needed. 01/16/18   Sherene Sires, DO  levocetirizine (XYZAL) 5 MG tablet TAKE 1 TABLET (5 MG TOTAL) BY MOUTH EVERY EVENING. DO NOT USE WITH BENADRYL 06/02/18   [place] Bing,  DO  polyvinyl alcohol (LIQUIFILM TEARS) 1.4 % ophthalmic solution Place 2 drops into both eyes 4 (four) times daily. Patient not taking: Reported on 04/05/2018 09/12/17   Lovenia Kim, MD  sertraline (ZOLOFT) 50 MG tablet Take 1 tablet (50 mg total) by mouth daily. 03/21/18   Anderson, Chelsey L, DO  tacrolimus (PROGRAF) 1 MG capsule Take 1 mg by mouth 2 (two) times daily. 05/10/18   [provider]  triamcinolone cream (KENALOG) 0.1 % Apply 1 application topically 2 (two) times daily. 05/11/18   Kinnie Feil, MD    Family History Family History  Problem Relation Age of Onset  . Kidney failure Mother   . Diabetes Mother   . Thyroid disease Mother   . Hypertension Mother   . Heart failure Mother   . Kidney failure Father   . Cancer Andrea Glenn   . Stroke Andrea Glenn   . Cancer Other     Social History Social History   Tobacco Use  . Smoking status: Current Every Day Smoker    Packs/day: 0.10    Years: 20.00    Pack years: 2.00    Types: Cigarettes    Start date: 72  . Smokeless tobacco: Never Used  . Tobacco comment: working on quitting - 2 per day - 2.26.19  Substance Use Topics  . Alcohol use: No  . Drug use: No     Allergies   Atorvastatin; Statins; Amitriptyline; Metformin and related; Adhesive [tape]; and Gabapentin   Review of Systems Review of Systems  All systems reviewed and negative, other than as noted in HPI.  Physical Exam Updated Vital Signs BP (!) 173/81   Pulse 82   Temp 98.1 F (36.7 C) (Oral)   Resp 15   Ht 5\' 4"  (1.626 m)   Wt 61.2 kg   SpO2 100%   BMI 23.17 kg/m   Physical Exam Vitals signs and nursing note reviewed.  Constitutional:      General: She is not in acute distress.    Appearance: She is well-developed.  HENT:     Head: Normocephalic and atraumatic.  Eyes:     General:        Right eye: No discharge.        Left eye: No discharge.     Conjunctiva/sclera: Conjunctivae normal.  Neck:     Musculoskeletal: Neck  supple.  Cardiovascular:     Rate and Rhythm: Normal rate and regular rhythm.     Heart sounds: Normal heart sounds. No murmur. No friction rub. No gallop.   Pulmonary:     Effort: Pulmonary effort is normal. No respiratory distress.     Breath sounds: Normal breath sounds.  Abdominal:     General: There is no distension.     Palpations: Abdomen is soft.     Tenderness: There is no abdominal  tenderness.  Musculoskeletal:        General: No tenderness.  Skin:    General: Skin is warm and dry.  Neurological:     Mental Status: She is alert.  Psychiatric:        Behavior: Behavior normal.        Thought Content: Thought content normal.      ED Treatments / Results  Labs (all labs ordered are listed, but only abnormal results are displayed) Labs Reviewed  CBC WITH DIFFERENTIAL/PLATELET - Abnormal; Notable for the following components:      Result Value   RBC 2.24 (*)    Hemoglobin 6.4 (*)    HCT 21.7 (*)    MCHC 29.5 (*)    RDW 17.3 (*)    All other components within normal limits  BASIC METABOLIC PANEL - Abnormal; Notable for the following components:   Chloride 114 (*)    Glucose, Bld 128 (*)    BUN 29 (*)    Creatinine, Ser 2.06 (*)    Calcium 8.4 (*)    GFR calc non Af Amer 26 (*)    GFR calc Af Amer 30 (*)    Anion gap 4 (*)    All other components within normal limits  BASIC METABOLIC PANEL - Abnormal; Notable for the following components:   Chloride 115 (*)    BUN 33 (*)    Creatinine, Ser 2.02 (*)    Calcium 8.2 (*)    GFR calc non Af Amer 27 (*)    GFR calc Af Amer 31 (*)    Anion gap 4 (*)    All other components within normal limits  CBC - Abnormal; Notable for the following components:   RBC 3.05 (*)    Hemoglobin 8.7 (*)    HCT 29.0 (*)    RDW 16.8 (*)    Platelets 139 (*)    All other components within normal limits  GLUCOSE, CAPILLARY - Abnormal; Notable for the following components:   Glucose-Capillary 182 (*)    All other components within  normal limits  GLUCOSE, CAPILLARY - Abnormal; Notable for the following components:   Glucose-Capillary 114 (*)    All other components within normal limits  GLUCOSE, CAPILLARY  OCCULT BLOOD X 1 CARD TO LAB, STOOL  TYPE AND SCREEN  PREPARE RBC (CROSSMATCH)    EKG EKG Interpretation  Date/Time:  Friday June 16 2018 20:02:49 EST Ventricular Rate:  79 PR Interval:    QRS Duration: 78 QT Interval:  364 QTC Calculation: 418 R Axis:   64 Text Interpretation:  Sinus rhythm Consider left ventricular hypertrophy Nonspecific T abnormalities, lateral leads No significant change since last tracing Confirmed by Varney Biles [phone]) on 06/18/2018 4:13:52 PM   Radiology No results found.  Procedures Procedures (including critical care time)  CRITICAL CARE Performed by: Virgel Manifold Total critical care time: 35 minutes Critical care time was exclusive of separately billable procedures and treating other patients. Critical care was necessary to treat or prevent imminent or life-threatening deterioration. Critical care was time spent personally by me on the following activities: development of treatment plan with patient and/or surrogate as well as nursing, discussions with consultants, evaluation of patient's response to treatment, examination of patient, obtaining history from patient or surrogate, ordering and performing treatments and interventions, ordering and review of laboratory studies, ordering and review of radiographic studies, pulse oximetry and re-evaluation of patient's condition.   Medications Ordered in ED Medications - No data to display  Initial Impression / Assessment and Plan / ED Course  I have reviewed the triage vital signs and the nursing notes.  Pertinent labs & imaging results that were available during my care of the patient were reviewed by me and considered in my medical decision making (see chart for details).     57yF with acute on chronic anemia.  Mildly symptomatic. HD stable. Will repeat to verify. Likely transfusion of 1u PRBC.  Final Clinical Impressions(s) / ED Diagnoses   Final diagnoses:  Symptomatic anemia    ED Discharge Orders    None       Virgel Manifold, MD 06/21/18 1205

## 2018-06-16 NOTE — ED Notes (Signed)
ED TO INPATIENT HANDOFF REPORT  Name/Age/Gender Andrea Glenn 58 y.o. female  Code Status Code Status History    Date Active Date Inactive Code Status Order ID Comments User Context   04/05/2018 2131 04/10/2018 1837 Full Code [id number]  Benay Pike, MD Inpatient   09/09/2017 1712 09/12/2017 1738 Full Code [id number]  Steve Rattler, DO Inpatient   07/22/2017 2353 07/25/2017 2232 Full Code [id number]  Toy Baker, MD Inpatient   10/08/2016 0139 10/10/2016 1811 Full Code [id number]  Rogue Bussing, MD ED    Advance Directive Documentation     Most Recent Value  Type of Advance Directive  Healthcare Power of Attorney, Living will  Pre-existing out of facility DNR order (yellow form or pink MOST form)  -  "MOST" Form in Place?  -      Home/SNF/Other Home  Chief Complaint low blood count  Level of Care/Admitting Diagnosis ED Disposition    ED Disposition Condition [place]: [facility] [100102]  Level of Care: Telemetry [5]  Admit to tele based on following criteria: Monitor for Ischemic changes  Diagnosis: Symptomatic anemia [2458099]  Admitting Physician: Rise Patience [phone]  Attending Physician: Rise Patience [email or web address]  PT Class (Do Not Modify): Observation [104]  PT Acc Code (Do Not Modify): Observation [10022]       Medical History Past Medical History:  Diagnosis Date  . Diabetes mellitus without complication ([place])   . Edema of both legs   . Grief 09/08/2017  . Hypertension   . Neuropathy     Allergies Allergies  Allergen Reactions  . Atorvastatin Other (See Comments)    Suicidal thoughts  . Statins Other (See Comments)    This class of meds causes "suicidal thoughts"  . Amitriptyline Other (See Comments)    Depression  . Metformin And Related Diarrhea  . Adhesive [Tape] Other (See Comments)    "Sometimes peels off my skin"  . Gabapentin Other (See Comments)    "Gives me the shakes"     IV Location/Drains/Wounds Patient Lines/Drains/Airways Status   Active Line/Drains/Airways    Name:   Placement date:   Placement time:   Site:   Days:   Peripheral IV 06/16/18 Right Antecubital   06/16/18    1022    Antecubital   less than 1   External Urinary Catheter   04/07/18    0930    -   70          Labs/Imaging Results for orders placed or performed during the hospital encounter of 06/16/18 (from the past 48 hour(s))  CBC with Differential     Status: Abnormal   Collection Time: 06/16/18 10:19 AM  Result Value Ref Range   WBC 8.4 4.0 - 10.5 K/uL   RBC 2.24 (L) 3.87 - 5.11 MIL/uL   Hemoglobin 6.4 (LL) 12.0 - 15.0 g/dL    Comment: This critical result has verified and been called to [facility]. RN by Raelyn Ensign on 02 28 2020 at 1040, and has been read back. CRITICAL RESULT VERIFIED   HCT 21.7 (L) 36.0 - 46.0 %   MCV 96.9 80.0 - 100.0 fL   MCH 28.6 26.0 - 34.0 pg   MCHC 29.5 (L) 30.0 - 36.0 g/dL   RDW 17.3 (H) 11.5 - 15.5 %   Platelets 169 150 - 400 K/uL   nRBC 0.0 0.0 - 0.2 %   Neutrophils Relative % 59 %   Neutro  Abs 5.0 1.7 - 7.7 K/uL   Lymphocytes Relative 32 %   Lymphs Abs 2.7 0.7 - 4.0 K/uL   Monocytes Relative 6 %   Monocytes Absolute 0.5 0.1 - 1.0 K/uL   Eosinophils Relative 2 %   Eosinophils Absolute 0.1 0.0 - 0.5 K/uL   Basophils Relative 1 %   Basophils Absolute 0.1 0.0 - 0.1 K/uL   Immature Granulocytes 0 %   Abs Immature Granulocytes 0.02 0.00 - 0.07 K/uL    Comment: Performed at [facility], [place] [address], [place], [place] [address]  Type and screen [place]     Status: None (Preliminary result)   Collection Time: 06/16/18 10:19 AM  Result Value Ref Range   ABO/RH(D) AB POS    Antibody Screen POS    Sample Expiration 06/19/2018    Antibody Identification ANTI JKA (Kidd a)    PT AG Type NEGATIVE FOR KIDD A ANTIGEN    Unit Number [id number]    Blood Component Type RED CELLS,LR    Unit division 00     Status of Unit ISSUED    Donor AG Type NEGATIVE FOR KIDD A ANTIGEN    Transfusion Status OK TO TRANSFUSE    Crossmatch Result COMPATIBLE   Basic metabolic panel     Status: Abnormal   Collection Time: 06/16/18 10:19 AM  Result Value Ref Range   Sodium 140 135 - 145 mmol/L   Potassium 4.4 3.5 - 5.1 mmol/L   Chloride 114 (H) 98 - 111 mmol/L   CO2 22 22 - 32 mmol/L   Glucose, Bld 128 (H) 70 - 99 mg/dL   BUN 29 (H) 6 - 20 mg/dL   Creatinine, Ser 2.06 (H) 0.44 - 1.00 mg/dL   Calcium 8.4 (L) 8.9 - 10.3 mg/dL   GFR calc non Af Amer 26 (L) >60 mL/min   GFR calc Af Amer 30 (L) >60 mL/min   Anion gap 4 (L) 5 - 15    Comment: Performed at [facility], [place] [address] [place], [place] [address]  Prepare RBC     Status: None   Collection Time: 06/16/18 10:43 AM  Result Value Ref Range   Order Confirmation      ORDER PROCESSED BY BLOOD BANK Performed at [facility], [place] [address] [place], [place] [address]    No results found.  Pending Labs Unresulted Labs (From admission, onward)   None      Vitals/Pain Today's Vitals   06/16/18 2020 06/16/18 2029 06/16/18 2038 06/16/18 2042  BP:      Pulse: 93 94 96 96  Resp: 15 15 20 17   Temp:      TempSrc:      SpO2: 99% 100% 98% 99%  Weight:      Height:      PainSc:        Isolation Precautions No active isolations  Medications Medications  0.9 %  sodium chloride infusion (Manually program via Guardrails IV Fluids) (has no administration in time range)  furosemide (LASIX) injection 20 mg (20 mg Intravenous Given 06/16/18 1933)  hydrALAZINE (APRESOLINE) injection 20 mg (20 mg Intravenous Given 06/16/18 1958)    Mobility walks with device

## 2018-06-16 NOTE — ED Notes (Signed)
Dr. Johnney Killian notified of patient increasing BP. Patient denies CP/SOB. Patient resting quietly. First unit of PRBC's almost completed. No word from Blood bank at this time when 2nd unit will be available. Per Verbal order by Dr. Wilson Singer, if 2nd unit is not available by the time the first unit is complete, 2nd unit to be cancelled.

## 2018-06-16 NOTE — ED Notes (Signed)
Blood bank notified this RN that the patient's blood units will be another 2 hours for preparation due to antibody identified. Dr. Wilson Singer made aware. Patient remains stable and VSS. Patient updated on delay.

## 2018-06-16 NOTE — ED Triage Notes (Signed)
Patient was called yesterday and was told her Hgb ws 6.6. Patient was unable to drive.

## 2018-06-16 NOTE — ED Notes (Signed)
Blood bank alerted this RN that the patient has antibodies in her type screen. Blood for transfusion will be delayed until the antibody is identified and an appropriate blood unit can be prepared. Dr. Wilson Singer made aware. Patient appropriate and VSS at this time.

## 2018-06-16 NOTE — ED Provider Notes (Signed)
[facility] DEPT Provider Note   CSN: [id number] Arrival date & time: 06/16/18  7616    History   Chief Complaint Chief Complaint  Patient presents with  . Abnormal Lab    HPI Andrea Glenn is a 58 y.o. female.     HPI   58 year old female with anemia.  History of the same.  She recently saw nephrology for nephrotic syndrome.  They did blood work.  She was notified today that her hemoglobin was 6.6.  On review of systems, she does endorse some mild fatigue and cold intolerance.  No dyspnea.  No dizziness or lightheadedness.  She is not anticoagulated.  Denies any bright red blood per rectum or melena. She has been having LE edema but says this has been better in the past couple weeks.   Past Medical History:  Diagnosis Date  . Diabetes mellitus without complication ([place])   . Edema of both legs   . Grief 09/08/2017  . Hypertension   . Neuropathy     Patient Active Problem List   Diagnosis Date Noted  . Nephrotic syndrome   . Symptomatic anemia 04/05/2018  . AKI (acute kidney injury) ([place])   . Leg swelling   . Diabetes due to underlying condition w diabetic nephropathy ([place])   . Pruritus 01/03/2018  . Scabies 12/15/2017  . Solitary pulmonary nodule 10/10/2017  . Depression 09/26/2017  . Urinary tract infection without hematuria   . Postherpetic neuralgia 09/08/2017  . Tremor   . Frequent falls 07/22/2017  . Dehydration 07/22/2017  . CKD (chronic kidney disease) 07/08/2017  . Anemia of chronic disease 06/21/2017  . Primary hypertension 02/03/2017  . Hematuria 01/20/2017  . Dysuria 01/20/2017  . Lower extremity edema 12/30/2016  . Pancreatitis 10/08/2016  . Hyperlipidemia associated with type 2 diabetes mellitus ([place]) 10/26/2013  . Restless leg syndrome 03/03/2010  . Uncontrolled type 2 diabetes mellitus with peripheral neuropathy ([place]) 06/16/2006  . Tobacco use disorder 06/16/2006    Past Surgical History:  Procedure  Laterality Date  . BACK SURGERY    . CESAREAN SECTION    . FOOT SURGERY    . SHOULDER SURGERY       OB History   No obstetric history on file.      Home Medications    Prior to Admission medications   Medication Sig Start Date End Date Taking? Authorizing Provider  acetaminophen (TYLENOL) 500 MG tablet Take 1,000 mg by mouth every 6 (six) hours as needed (for pain or headaches).    Yes [provider]  ammonium lactate (AMLACTIN) 12 % lotion Apply 1 application topically daily. Apply to both feet daily 06/09/18  Yes [place], Stephani Police, DPM  Dulaglutide 1.5 MG/0.5ML SOPN Inject 1.5 mg into the skin once a week. 11/02/17  Yes Hensel, Jamal Collin, MD  FLUoxetine (PROZAC) 20 MG capsule Take 1 capsule (20 mg total) by mouth daily. 05/16/18  Yes Franklin Bing, DO  furosemide (LASIX) 40 MG tablet Take 1 tablet (40 mg total) by mouth 2 (two) times daily. 04/10/18  Yes Lovenia Kim, MD  hydrOXYzine (ATARAX/VISTARIL) 25 MG tablet Take 1 tablet (25 mg total) by mouth 3 (three) times daily as needed. Patient taking differently: Take 25 mg by mouth 3 (three) times daily as needed for itching.  01/16/18  Yes Bland, Scott, DO  levocetirizine (XYZAL) 5 MG tablet TAKE 1 TABLET (5 MG TOTAL) BY MOUTH EVERY EVENING. DO NOT USE WITH BENADRYL 06/02/18  Yes Harriet Butte  J, DO  sertraline (ZOLOFT) 50 MG tablet Take 1 tablet (50 mg total) by mouth daily. 03/21/18  Yes Anderson, Chelsey L, DO  tacrolimus (PROGRAF) 1 MG capsule Take 1 mg by mouth 2 (two) times daily. 05/10/18  Yes [provider]  bisacodyl (DULCOLAX) 5 MG EC tablet Take 1 tablet (5 mg total) by mouth daily as needed for moderate constipation. Patient not taking: Reported on 04/05/2018 07/25/17   Patrecia Pour, Christean Grief, MD  capsaicin (ZOSTRIX) 0.025 % cream Apply topically 2 (two) times daily as needed (pain). Patient not taking: Reported on 06/16/2018 04/13/18   Marjie Skiff, MD  clotrimazole (LOTRIMIN) 1 % cream Apply 1  application topically 2 (two) times daily. Patient not taking: Reported on 04/05/2018 10/07/17   Tonette Bihari, MD  diphenhydrAMINE (BENADRYL ALLERGY) 25 mg capsule Take 1 capsule (25 mg total) by mouth every 6 (six) hours as needed. Patient not taking: Reported on 06/16/2018 03/21/18   Doristine Mango L, DO  DULoxetine (CYMBALTA) 30 MG capsule Take 1 capsule (30 mg total) by mouth daily. Patient not taking: Reported on 06/16/2018 11/10/17   Zenia Resides, MD  Elastic Bandages & Supports (MEDICAL COMPRESSION STOCKINGS) [place] 1 Package by Does not apply route daily. Patient not taking: Reported on 04/05/2018 03/21/18   Doristine Mango L, DO  polyvinyl alcohol (LIQUIFILM TEARS) 1.4 % ophthalmic solution Place 2 drops into both eyes 4 (four) times daily. Patient not taking: Reported on 04/05/2018 09/12/17   Lovenia Kim, MD  triamcinolone cream (KENALOG) 0.1 % Apply 1 application topically 2 (two) times daily. Patient not taking: Reported on 06/16/2018 05/11/18   Kinnie Feil, MD    Family History Family History  Problem Relation Age of Onset  . Kidney failure Mother   . Diabetes Mother   . Thyroid disease Mother   . Hypertension Mother   . Heart failure Mother   . Kidney failure Father   . Cancer Sister   . Stroke Brother   . Cancer Other     Social History Social History   Tobacco Use  . Smoking status: Current Every Day Smoker    Packs/day: 0.10    Years: 20.00    Pack years: 2.00    Types: Cigarettes    Start date: 86  . Smokeless tobacco: Never Used  . Tobacco comment: working on quitting - 2 per day - 2.26.19  Substance Use Topics  . Alcohol use: No  . Drug use: No     Allergies   Atorvastatin; Statins; Amitriptyline; Metformin and related; Adhesive [tape]; and Gabapentin   Review of Systems Review of Systems All systems reviewed and negative, other than as noted in HPI.   Physical Exam Updated Vital Signs BP (!) 175/82   Pulse 79   Temp 98.1  F (36.7 C) (Oral)   Resp (!) 79   Ht 5\' 4"  (1.626 m)   Wt 61.2 kg   SpO2 100%   BMI 23.17 kg/m   Physical Exam Vitals signs and nursing note reviewed.  Constitutional:      General: She is not in acute distress.    Appearance: She is well-developed.  HENT:     Head: Normocephalic and atraumatic.  Eyes:     General:        Right eye: No discharge.        Left eye: No discharge.     Conjunctiva/sclera: Conjunctivae normal.  Neck:     Musculoskeletal: Neck supple.  Cardiovascular:  Rate and Rhythm: Normal rate and regular rhythm.     Heart sounds: Normal heart sounds. No murmur. No friction rub. No gallop.   Pulmonary:     Effort: Pulmonary effort is normal. No respiratory distress.     Breath sounds: Normal breath sounds.  Abdominal:     General: There is no distension.     Palpations: Abdomen is soft.     Tenderness: There is no abdominal tenderness.  Musculoskeletal:        General: No tenderness.  Skin:    General: Skin is warm and dry.  Neurological:     Mental Status: She is alert.  Psychiatric:        Behavior: Behavior normal.        Thought Content: Thought content normal.      ED Treatments / Results  Labs (all labs ordered are listed, but only abnormal results are displayed) Labs Reviewed  CBC WITH DIFFERENTIAL/PLATELET - Abnormal; Notable for the following components:      Result Value   RBC 2.24 (*)    Hemoglobin 6.4 (*)    HCT 21.7 (*)    MCHC 29.5 (*)    RDW 17.3 (*)    All other components within normal limits  BASIC METABOLIC PANEL - Abnormal; Notable for the following components:   Chloride 114 (*)    Glucose, Bld 128 (*)    BUN 29 (*)    Creatinine, Ser 2.06 (*)    Calcium 8.4 (*)    GFR calc non Af Amer 26 (*)    GFR calc Af Amer 30 (*)    Anion gap 4 (*)    All other components within normal limits  TYPE AND SCREEN  PREPARE RBC (CROSSMATCH)    EKG None  Radiology No results found.  Procedures Procedures (including  critical care time)  Medications Ordered in ED Medications  0.9 %  sodium chloride infusion (Manually program via Guardrails IV Fluids) (has no administration in time range)  furosemide (LASIX) injection 20 mg (has no administration in time range)     Initial Impression / Assessment and Plan / ED Course  I have reviewed the triage vital signs and the nursing notes.  Pertinent labs & imaging results that were available during my care of the patient were reviewed by me and considered in my medical decision making (see chart for details).  58 year old female with anemia.  History of the same.  Mildly symptomatic.  Hemoglobin most recently in the eights.  6.4 today. Pt not interested in being admitted. I do not feel she needs additional w/u at this time, simply transfused. Initially ordered 2u PRBC. Has antibodies though. 1u ready. If cannot obtain second unit by the time the first is done transfusing then I'm fine with her getting one. She has established outpt FU.   Final Clinical Impressions(s) / ED Diagnoses   Final diagnoses:  Symptomatic anemia    ED Discharge Orders    None       Virgel Manifold, MD 06/16/18 [phone]

## 2018-06-17 ENCOUNTER — Encounter: Payer: Self-pay | Admitting: Podiatry

## 2018-06-17 ENCOUNTER — Observation Stay (HOSPITAL_BASED_OUTPATIENT_CLINIC_OR_DEPARTMENT_OTHER): Payer: Medicaid Other

## 2018-06-17 DIAGNOSIS — N184 Chronic kidney disease, stage 4 (severe): Secondary | ICD-10-CM | POA: Diagnosis not present

## 2018-06-17 DIAGNOSIS — D649 Anemia, unspecified: Secondary | ICD-10-CM | POA: Diagnosis not present

## 2018-06-17 DIAGNOSIS — E1122 Type 2 diabetes mellitus with diabetic chronic kidney disease: Secondary | ICD-10-CM | POA: Diagnosis not present

## 2018-06-17 DIAGNOSIS — D638 Anemia in other chronic diseases classified elsewhere: Secondary | ICD-10-CM | POA: Diagnosis not present

## 2018-06-17 DIAGNOSIS — R609 Edema, unspecified: Secondary | ICD-10-CM | POA: Diagnosis not present

## 2018-06-17 DIAGNOSIS — I129 Hypertensive chronic kidney disease with stage 1 through stage 4 chronic kidney disease, or unspecified chronic kidney disease: Secondary | ICD-10-CM | POA: Diagnosis not present

## 2018-06-17 LAB — CBC
HCT: 29 % — ABNORMAL LOW (ref 36.0–46.0)
Hemoglobin: 8.7 g/dL — ABNORMAL LOW (ref 12.0–15.0)
MCH: 28.5 pg (ref 26.0–34.0)
MCHC: 30 g/dL (ref 30.0–36.0)
MCV: 95.1 fL (ref 80.0–100.0)
Platelets: 139 10*3/uL — ABNORMAL LOW (ref 150–400)
RBC: 3.05 MIL/uL — AB (ref 3.87–5.11)
RDW: 16.8 % — ABNORMAL HIGH (ref 11.5–15.5)
WBC: 8.6 10*3/uL (ref 4.0–10.5)
nRBC: 0 % (ref 0.0–0.2)

## 2018-06-17 LAB — BASIC METABOLIC PANEL
ANION GAP: 4 — AB (ref 5–15)
BUN: 33 mg/dL — ABNORMAL HIGH (ref 6–20)
CHLORIDE: 115 mmol/L — AB (ref 98–111)
CO2: 22 mmol/L (ref 22–32)
Calcium: 8.2 mg/dL — ABNORMAL LOW (ref 8.9–10.3)
Creatinine, Ser: 2.02 mg/dL — ABNORMAL HIGH (ref 0.44–1.00)
GFR calc Af Amer: 31 mL/min — ABNORMAL LOW (ref >60)
GFR calc non Af Amer: 27 mL/min — ABNORMAL LOW (ref >60)
Glucose, Bld: 90 mg/dL (ref 70–99)
POTASSIUM: 3.9 mmol/L (ref 3.5–5.1)
Sodium: 141 mmol/L (ref 135–145)

## 2018-06-17 LAB — GLUCOSE, CAPILLARY
Glucose-Capillary: 85 mg/dL (ref 70–99)
Glucose-Capillary: 114 mg/dL — ABNORMAL HIGH (ref 70–99)

## 2018-06-17 LAB — OCCULT BLOOD X 1 CARD TO LAB, STOOL: FECAL OCCULT BLD: NEGATIVE

## 2018-06-17 MED ORDER — AMLODIPINE BESYLATE 10 MG PO TABS
10.0000 mg | ORAL_TABLET | Freq: Every day | ORAL | Status: DC
  Administered 2018-06-17: 10 mg via ORAL
  Filled 2018-06-17: qty 1

## 2018-06-17 MED ORDER — AMLODIPINE BESYLATE 10 MG PO TABS: 10.0000 mg | ORAL_TABLET | Freq: Every day | ORAL | 0 refills | Status: DC

## 2018-06-17 MED ORDER — HYDRALAZINE HCL 25 MG PO TABS: 25.0000 mg | ORAL_TABLET | Freq: Three times a day (TID) | ORAL | 0 refills | Status: AC

## 2018-06-17 NOTE — Progress Notes (Signed)
Bilateral lower extremity venous duplex completed. Preliminary results in Chart review CV Proc. [place] Jaeley Wiker,RVS 06/17/2018, 10:19 AM

## 2018-06-17 NOTE — Progress Notes (Addendum)
Subjective: Andrea Glenn presents today referred by [place] Bing, DO with history of diabetes and cc of painful, discolored, thick toenails which interfere with daily activities.  Pain is aggravated when wearing enclosed shoe gear.   Patient states she is been diabetic for 9 years.  She does relate symptoms of numbness, tingling, and burning in her feet.   She states her blood sugar was 144 mg/dL this morning.  Last A1c 6.2.  She denies any history of foot wounds.  Past Medical History:  Diagnosis Date  . Diabetes mellitus without complication ([place])   . Edema of both legs   . Grief 09/08/2017  . Hypertension   . Neuropathy     Patient Active Problem List   Diagnosis Date Noted  . DM (diabetes mellitus), type 2 with renal complications ([place]) 08/67/6195  . Essential hypertension 06/16/2018  . Nephrotic syndrome   . Symptomatic anemia 04/05/2018  . AKI (acute kidney injury) ([place])   . Leg swelling   . Diabetes due to underlying condition w diabetic nephropathy ([place])   . Pruritus 01/03/2018  . Scabies 12/15/2017  . Solitary pulmonary nodule 10/10/2017  . Depression 09/26/2017  . Urinary tract infection without hematuria   . Postherpetic neuralgia 09/08/2017  . Tremor   . Frequent falls 07/22/2017  . Dehydration 07/22/2017  . CKD (chronic kidney disease) 07/08/2017  . Anemia of chronic disease 06/21/2017  . Primary hypertension 02/03/2017  . Hematuria 01/20/2017  . Dysuria 01/20/2017  . Lower extremity edema 12/30/2016  . Pancreatitis 10/08/2016  . Hyperlipidemia associated with type 2 diabetes mellitus ([place]) 10/26/2013  . Restless leg syndrome 03/03/2010  . Uncontrolled type 2 diabetes mellitus with peripheral neuropathy ([place]) 06/16/2006  . Tobacco use disorder 06/16/2006    Past Surgical History:  Procedure Laterality Date  . BACK SURGERY    . CESAREAN SECTION    . FOOT SURGERY    . SHOULDER SURGERY       Allergies  Allergen Reactions  . Atorvastatin  Other (See Comments)    Suicidal thoughts  . Statins Other (See Comments)    This class of meds causes "suicidal thoughts"  . Amitriptyline Other (See Comments)    Depression  . Metformin And Related Diarrhea  . Adhesive [Tape] Other (See Comments)    "Sometimes peels off my skin"  . Gabapentin Other (See Comments)    "Gives me the shakes"    Social History   Occupational History  . Not on file  Tobacco Use  . Smoking status: Current Every Day Smoker    Packs/day: 0.10    Years: 20.00    Pack years: 2.00    Types: Cigarettes    Start date: 17  . Smokeless tobacco: Never Used  . Tobacco comment: working on quitting - 2 per day - 2.26.19  Substance and Sexual Activity  . Alcohol use: No  . Drug use: No  . Sexual activity: Not on file    Family History  Problem Relation Age of Onset  . Kidney failure Mother   . Diabetes Mother   . Thyroid disease Mother   . Hypertension Mother   . Heart failure Mother   . Kidney failure Father   . Cancer Sister   . Stroke Brother   . Cancer Other     Immunization History  Administered Date(s) Administered  . Influenza,inj,Quad PF,6+ Mos 01/16/2018  . Pneumococcal Polysaccharide-23 07/25/2017   Review of systems: Positive Findings in bold print.  Constitutional:  chills,  fatigue, fever, sweats, weight change Communication: Optometrist, sign Ecologist, hand writing, iPad/Android device Head: headaches, head injury Eyes: changes in vision, eye pain, glaucoma, cataracts, macular degeneration, diplopia, glare,  light sensitivity, eyeglasses or contacts, blindness Ears nose mouth throat: Hard of hearing, ringing in ears, deaf, sign language,  vertigo,   nosebleeds,  rhinitis,  cold sores, snoring, swollen glands Cardiovascular: HTN, edema, arrhythmia, pacemaker in place, defibrillator in place,  chest pain/tightness, chronic anticoagulation, blood clot, heart failure,  edema Peripheral Vascular: leg cramps, varicose veins,  blood clots, lymphedema, Respiratory:  difficulty breathing, denies congestion, SOB, wheezing, cough, emphysema Gastrointestinal: change in appetite or weight, abdominal pain, constipation, diarrhea, nausea, vomiting, vomiting blood, change in bowel habits, abdominal pain, jaundice, rectal bleeding, hemorrhoids, Genitourinary:  nocturia,  pain on urination,  blood in urine, Foley catheter, urinary urgency Musculoskeletal: uses mobility aid,  cramping, stiff joints, painful joints, decreased joint motion, fractures, OA, gout Skin: +changes in toenails, color change, dryness, itching, mole changes,  rash  Neurological: headaches, numbness in feet, paresthesias in feet, burning in feet, fainting,  seizures, change in speech. denies headaches, memory problems/poor historian, cerebral palsy, weakness, paralysis Endocrine: diabetes, hypothyroidism, hyperthyroidism,  goiter, dry mouth, flushing, heat intolerance,  cold intolerance,  excessive thirst, denies polyuria,  nocturia Hematological:  easy bleeding, excessive bleeding, easy bruising, enlarged lymph nodes, on long term blood thinner, history of past transusions Allergy/immunological:  hives, eczema, frequent infections, multiple drug allergies, seasonal allergies, transplant recipient Psychiatric:  anxiety, depression, mood disorder, suicidal ideations, hallucinations   Objective: Vascular Examination: Capillary refill time immediate x 10 digits. Dorsalis pedis 2/4 bilaterally  Posterior tibial pulses 1/4 bilaterally  No digital hair x 10 digits Skin temperature gradient WNL b/l  Dermatological Examination: Skin noted to be dry flaky bilaterally.  Normal turgor and tone bilaterally  Toenails 1-5 b/l discolored, thick, dystrophic with subungual debris and pain with palpation to nailbeds due to thickness of nails.  Hyperkeratotic lesion noted submetatarsal head 1 right foot.  There is tenderness to palpation.  There is no erythema, no edema,  no drainage, no flocculence noted.  Musculoskeletal: Muscle strength 5/5 to all LE muscle groups  Neurological: Sensation intact with 10 gram monofilament Vibratory sensation intact.  Assessment: 1. Painful onychomycosis toenails 1-5 b/l  2. Callus submetatarsal head 1 right foot 3. NIDDM with neuropathy  Plan: 1. Discussed diabetic foot care principles. Literature dispensed on today. 2. For xerosis, prescription written for AmLactin lotion.  She is to apply to her feet once daily.  3. Toenails 1-5 b/l were debrided in length and girth without iatrogenic bleeding. Calluses pared submetatarsal head(s) 1 right foot without incident utilizing sterile scalpel blade. 4. Patient to continue soft, supportive shoe gear. 5. Patient to report any pedal injuries to medical professional immediately. 6. Follow up 3 months.  7. Patient/POA to call should there be a concern in the interim.

## 2018-06-17 NOTE — Discharge Summary (Addendum)
Physician Discharge Summary  JAYMEE TILSON AOZ:[id number] DOB: 06/27/1960 DOA: 06/16/2018  PCP: [place] Bing, DO  Admit date: 06/16/2018 Discharge date: 06/17/2018  Admitted From: Home Disposition:  Home  Discharge Condition:Stable CODE STATUS:FULL Diet recommendation: Heart Healthy  Brief/Interim Summary: HPI: Andrea Glenn is a 58 y.o. female with history of chronic kidney disease stage IV with nephrotic syndrome, hypertension, anemia was referred to the ER for blood transfusion by patient's nephrologist after patient's routine blood work showed hemoglobin had decreased from December 2019.  Patient denies seeing any blood in the stools or vomiting blood.  Has been feeling weak at times dizzy.  Gets short of breath also.  Patient blood pressure has been increasing recently and at her nephrologist office it was more than 962 systolic per the patient.  Her Lasix dose was increased also.  ED Course: In the ER labs done showed hemoglobin of 6.4 which was a drop from 8.7  2 months ago.  2 units of PRBC transfusion has been ordered.  IV hydralazine was given for blood pressure.  Hospital Course:  Patient's hospital course remained stable.  She was transfused with 2 units of PRBC yesterday.  Her hemoglobin this morning is 8.7.  Patient seen and examined the bedside this morning.  She was found to be mildly hypertensive.  We started  her on hydralazine 25 mg 3 times a day.  Her nephrologist has recently increased the dose of Lasix to 40 mg twice a day.  She also has some bilateral lower extremity edema.  Venous duplex was negative for DVTs.  We checked fecal occult blood test and it was negative.  Patient denies any change in the color of her stool.  Her anemia was most likely associated with her chronic kidney disease. She is stable for discharge to home today.  She needs to follow-up with her PCP in a week and do a CBC test for the follow-up.  She also needs to monitor her blood pressure at  home.  She is to follow-up with her nephrologist as an outpatient.  Following problems were addressed during her hospitalization:  1. Symptomatic anemia :Anemia likely from chronic kidney disease.  2 units of packed red blood cell was transfused with HB of 8.7 today.FOBT negative. 2. Hypertension uncontrolled : Continue Lasix 40 mg twice daily at home and we  also started on hydralazine 25 mg 3 times a day.  Monitor blood pressure at home. 3. Chronic kidney disease stage IV with nephrotic syndrome :Being followed by nephrologist.  On Lasix.  Patient also takes Prograf. 4. Diabetes mellitus type 2 :Continue home regimen. 5. Depression: on Zoloft and Prozac.     Discharge Diagnoses:  Principal Problem:   Symptomatic anemia Active Problems:   DM (diabetes mellitus), type 2 with renal complications [facility])   Essential hypertension    Discharge Instructions  Discharge Instructions    Diet - low sodium heart healthy   Complete by:  As directed    Discharge instructions   Complete by:  As directed    1)Please follow-up with your PCP and nephrologist as an outpatient.Check CBC in a week. 2)Monitor your blood pressure at home.Take prescribed medication as instructed.   Increase activity slowly   Complete by:  As directed      Allergies as of 06/17/2018      Reactions   Atorvastatin Other (See Comments)   Suicidal thoughts   Statins Other (See Comments)   This class of meds causes "suicidal thoughts"  Amitriptyline Other (See Comments)   Depression   Metformin And Related Diarrhea   Adhesive [tape] Other (See Comments)   "Sometimes peels off my skin"   Gabapentin Other (See Comments)   "Gives me the shakes"      Medication List    TAKE these medications   acetaminophen 500 MG tablet Commonly known as:  TYLENOL Take 1,000 mg by mouth every 6 (six) hours as needed (for pain or headaches).   ammonium lactate 12 % lotion Commonly known as:  AMLACTIN Apply 1 application  topically daily. Apply to both feet daily   bisacodyl 5 MG EC tablet Commonly known as:  DULCOLAX Take 1 tablet (5 mg total) by mouth daily as needed for moderate constipation.   capsaicin 0.025 % cream Commonly known as:  ZOSTRIX Apply topically 2 (two) times daily as needed (pain).   clotrimazole 1 % cream Commonly known as:  LOTRIMIN Apply 1 application topically 2 (two) times daily.   diphenhydrAMINE 25 mg capsule Commonly known as:  BENADRYL ALLERGY Take 1 capsule (25 mg total) by mouth every 6 (six) hours as needed.   Dulaglutide 1.5 MG/0.5ML Sopn Inject 1.5 mg into the skin once a week.   DULoxetine 30 MG capsule Commonly known as:  CYMBALTA Take 1 capsule (30 mg total) by mouth daily.   FLUoxetine 20 MG capsule Commonly known as:  PROZAC Take 1 capsule (20 mg total) by mouth daily.   furosemide 40 MG tablet Commonly known as:  LASIX Take 1 tablet (40 mg total) by mouth 2 (two) times daily.   hydrALAZINE 25 MG tablet Commonly known as:  APRESOLINE Take 1 tablet (25 mg total) by mouth 3 (three) times daily for 30 days.   hydrOXYzine 25 MG tablet Commonly known as:  ATARAX/VISTARIL Take 1 tablet (25 mg total) by mouth 3 (three) times daily as needed. What changed:  reasons to take this   levocetirizine 5 MG tablet Commonly known as:  XYZAL TAKE 1 TABLET (5 MG TOTAL) BY MOUTH EVERY EVENING. DO NOT USE WITH BENADRYL   Medical Compression Stockings Misc 1 Package by Does not apply route daily.   polyvinyl alcohol 1.4 % ophthalmic solution Commonly known as:  LIQUIFILM TEARS Place 2 drops into both eyes 4 (four) times daily.   sertraline 50 MG tablet Commonly known as:  ZOLOFT Take 1 tablet (50 mg total) by mouth daily.   tacrolimus 1 MG capsule Commonly known as:  PROGRAF Take 1 mg by mouth 2 (two) times daily.   triamcinolone cream 0.1 % Commonly known as:  KENALOG Apply 1 application topically 2 (two) times daily.      Follow-up Information     [place] Bing, DO. Schedule an appointment as soon as possible for a visit in 1 week(s).   Specialty:  Family Medicine Contact information: [address] [place] [address] [phone]          Allergies  Allergen Reactions  . Atorvastatin Other (See Comments)    Suicidal thoughts  . Statins Other (See Comments)    This class of meds causes "suicidal thoughts"  . Amitriptyline Other (See Comments)    Depression  . Metformin And Related Diarrhea  . Adhesive [Tape] Other (See Comments)    "Sometimes peels off my skin"  . Gabapentin Other (See Comments)    "Gives me the shakes"    Consultations:  None   Procedures/Studies: Vas [place] Lower Extremity Venous (dvt)  Result Date: 06/17/2018  Lower  Venous Study Indications: Edema.  Performing Technologist: Toma Copier, D  Examination Guidelines: A complete evaluation includes B-mode imaging, spectral Doppler, color Doppler, and power Doppler as needed of all accessible portions of each vessel. Bilateral testing is considered an integral part of a complete examination. Limited examinations for reoccurring indications may be performed as noted.  Right Venous Findings: +---------+---------------+---------+-----------+----------+-------+          CompressibilityPhasicitySpontaneityPropertiesSummary +---------+---------------+---------+-----------+----------+-------+ CFV      Full           Yes      Yes                          +---------+---------------+---------+-----------+----------+-------+ SFJ      Full                                                 +---------+---------------+---------+-----------+----------+-------+ FV Prox  Full           Yes      Yes                          +---------+---------------+---------+-----------+----------+-------+ FV Mid   Full                                                 +---------+---------------+---------+-----------+----------+-------+ FV DistalFull            Yes      Yes                          +---------+---------------+---------+-----------+----------+-------+ PFV      Full           Yes      Yes                          +---------+---------------+---------+-----------+----------+-------+ POP      Full           Yes      Yes                          +---------+---------------+---------+-----------+----------+-------+ PTV      Full                                                 +---------+---------------+---------+-----------+----------+-------+ PERO     Full                                                 +---------+---------------+---------+-----------+----------+-------+  Left Venous Findings: +---------+---------------+---------+-----------+----------+-------+          CompressibilityPhasicitySpontaneityPropertiesSummary +---------+---------------+---------+-----------+----------+-------+ CFV      Full           Yes      Yes                          +---------+---------------+---------+-----------+----------+-------+  SFJ      Full                                                 +---------+---------------+---------+-----------+----------+-------+ FV Prox  Full           Yes      Yes                          +---------+---------------+---------+-----------+----------+-------+ FV Mid   Full                                                 +---------+---------------+---------+-----------+----------+-------+ FV DistalFull           Yes      Yes                          +---------+---------------+---------+-----------+----------+-------+ PFV      Full           Yes      Yes                          +---------+---------------+---------+-----------+----------+-------+ POP      Full           Yes      Yes                          +---------+---------------+---------+-----------+----------+-------+ PTV      Full                                                  +---------+---------------+---------+-----------+----------+-------+ PERO     Full                                                 +---------+---------------+---------+-----------+----------+-------+    Summary: Right: There is no evidence of deep vein thrombosis in the lower extremity. No cystic structure found in the popliteal fossa. Left: There is no evidence of deep vein thrombosis in the lower extremity. No cystic structure found in the popliteal fossa.  *See table(s) above for measurements and observations.    Preliminary       Subjective: Patient seen and examined the bedside this morning.  Remains comfortable.  Mildly hypertensive.  Denies any specific complaints.  Hemoglobin this morning stable.  Stable for discharge to home today.  Discharge Exam: Vitals:   06/17/18 0747 06/17/18 1021  BP: (!) 174/85 (!) 153/71  Pulse: 77   Resp: 16   Temp: 97.9 F (36.6 C)   SpO2: 98%    Vitals:   06/17/18 0400 06/17/18 0500 06/17/18 0747 06/17/18 1021  BP: (!) 150/72  (!) 174/85 (!) 153/71  Pulse: 72  77   Resp: 16  16   Temp: 97.7 F (36.5 C)  97.9 F (36.6 C)   TempSrc:   Oral   SpO2:  98%   Weight:  66.8 kg    Height:        General: Pt is alert, awake, not in acute distress Cardiovascular: RRR, S1/S2 +, no rubs, no gallops Respiratory: CTA bilaterally, no wheezing, no rhonchi Abdominal: Soft, NT, ND, bowel sounds + Extremities: Bilateral lower extremity  edema, no cyanosis    The results of significant diagnostics from this hospitalization (including imaging, microbiology, ancillary and laboratory) are listed below for reference.     Microbiology: No results found for this or any previous visit (from the past 240 hour(s)).   Labs: BNP (last 3 results) Recent Labs    07/12/17 1550 04/05/18 1702  BNP 174.8* 76.1   Basic Metabolic Panel: Recent Labs  Lab 06/16/18 1019 06/17/18 0545  NA 140 141  K 4.4 3.9  CL 114* 115*  CO2 22 22  GLUCOSE 128* 90   BUN 29* 33*  CREATININE 2.06* 2.02*  CALCIUM 8.4* 8.2*   Liver Function Tests: No results for input(s): AST, ALT, ALKPHOS, BILITOT, PROT, ALBUMIN in the last 168 hours. No results for input(s): LIPASE, AMYLASE in the last 168 hours. No results for input(s): AMMONIA in the last 168 hours. CBC: Recent Labs  Lab 06/16/18 1019 06/17/18 0545  WBC 8.4 8.6  NEUTROABS 5.0  --   HGB 6.4* 8.7*  HCT 21.7* 29.0*  MCV 96.9 95.1  PLT 169 139*   Cardiac Enzymes: No results for input(s): CKTOTAL, CKMB, CKMBINDEX, TROPONINI in the last 168 hours. BNP: Invalid input(s): POCBNP CBG: Recent Labs  Lab 06/16/18 2234 06/17/18 0804 06/17/18 1207  GLUCAP 182* 85 114*   D-Dimer No results for input(s): DDIMER in the last 72 hours. Hgb A1c No results for input(s): HGBA1C in the last 72 hours. Lipid Profile No results for input(s): CHOL, HDL, LDLCALC, TRIG, CHOLHDL, LDLDIRECT in the last 72 hours. Thyroid function studies No results for input(s): TSH, T4TOTAL, T3FREE, THYROIDAB in the last 72 hours.  Invalid input(s): FREET3 Anemia work up No results for input(s): VITAMINB12, FOLATE, FERRITIN, TIBC, IRON, RETICCTPCT in the last 72 hours. Urinalysis    Component Value Date/Time   COLORURINE YELLOW 04/06/2018 0703   APPEARANCEUR CLOUDY (A) 04/06/2018 0703   LABSPEC 1.015 04/06/2018 0703   PHURINE 8.0 04/06/2018 0703   GLUCOSEU 150 (A) 04/06/2018 0703   HGBUR NEGATIVE 04/06/2018 0703   BILIRUBINUR NEGATIVE 04/06/2018 0703   BILIRUBINUR negative 08/17/2017 0939   KETONESUR NEGATIVE 04/06/2018 0703   PROTEINUR >=300 (A) 04/06/2018 0703   UROBILINOGEN 0.2 08/17/2017 0939   UROBILINOGEN 0.2 07/28/2013 0008   NITRITE POSITIVE (A) 04/06/2018 0703   LEUKOCYTESUR TRACE (A) 04/06/2018 0703   Sepsis Labs Invalid input(s): PROCALCITONIN,  WBC,  LACTICIDVEN Microbiology No results found for this or any previous visit (from the past 240 hour(s)).  Please note: You were cared for by a  hospitalist during your hospital stay. Once you are discharged, your primary care physician will handle any further medical issues. Please note that NO REFILLS for any discharge medications will be authorized once you are discharged, as it is imperative that you return to your primary care physician (or establish a relationship with a primary care physician if you do not have one) for your post hospital discharge needs so that they can reassess your need for medications and monitor your lab values.    Time coordinating discharge: 40 minutes  SIGNED:   Shelly Coss, MD  [facility] 06/17/2018, 12:43 PM Pager [phone]  If 7PM-7AM, please contact night-coverage  [email or web address] Password [email or web address]

## 2018-06-19 LAB — BPAM RBC
Blood Product Expiration Date: 202003202359
Blood Product Expiration Date: 202004042359
ISSUE DATE / TIME: 202002281536
ISSUE DATE / TIME: 202002290052
Unit Type and Rh: 6200
Unit Type and Rh: 6200

## 2018-06-19 LAB — TYPE AND SCREEN
ABO/RH(D): AB POS
Antibody Screen: POSITIVE
Donor AG Type: NEGATIVE
Donor AG Type: NEGATIVE
PT AG Type: NEGATIVE
Unit division: 0
Unit division: 0

## 2018-06-28 ENCOUNTER — Other Ambulatory Visit: Payer: Self-pay

## 2018-06-28 ENCOUNTER — Ambulatory Visit (INDEPENDENT_AMBULATORY_CARE_PROVIDER_SITE_OTHER): Payer: Medicaid Other | Admitting: Family Medicine

## 2018-06-28 VITALS — BP 135/80 | HR 88 | Temp 98.3°F | Wt 138.0 lb

## 2018-06-28 DIAGNOSIS — I10 Essential (primary) hypertension: Secondary | ICD-10-CM | POA: Diagnosis not present

## 2018-06-28 DIAGNOSIS — D649 Anemia, unspecified: Secondary | ICD-10-CM | POA: Diagnosis present

## 2018-06-28 NOTE — Assessment & Plan Note (Signed)
Acute.  Resolved status post 2 units pBRC.  She is asymptomatic on my exam.  She does have some signs of fluid overload, however her weight appears to be down recently 10 pounds from 2 weeks ago.  Her blood pressure is better controlled when compared to her history.  No clear signs of source of symptomatic anemia.  No prior history of colorectal cancer screening. - Checking CBC with differential - Ambulatory referral to GI for diagnostic colonoscopy, patient given contact information - Reviewed return precautions, RTC 1 month or sooner if needed

## 2018-06-28 NOTE — Progress Notes (Signed)
   Subjective   Patient ID: Andrea Glenn    DOB: 18-Jun-1960, 58 y.o. female   MRN: [id number]  CC: "Hospital follow-up"  HPI: Andrea Glenn is a 58 y.o. female who presents to clinic today for the following:  Symptomatic anemia: Andrea Glenn is a CKDIV with nephrotic syndrome patient who was recently seen by her nephrologist and was found to have a significant drop in her hemoglobin from 8.7-6.4 over the course of 2 months.  She was subsequently sent to the ED for transfusion and was found to be hypertensive.  She was monitored overnight and transfused with 2 units with adequate response.  She was continued on Lasix and started on hydralazine.  She has since can continue these medications and feels back at her baseline today.  She denies any feelings of syncope or fatigue, chest pain, shortness of breath, abdominal pain, melena or hematochezia, constipation or diarrhea, hematuria, vaginal bleeding.  She is not on any blood thinners.  ROS: see HPI for pertinent.  [place]ar: IDDM, recent pancreatitis, tobacco use disorder, RLS, HLD.Surgical shoulder and foot surgery, c-sec, back surgery. FamilyhistoryCKD, DM, thyroid disease, HTN, HF, stroke,cancer(sister). Smoking status reviewed. Medications reviewed.  Objective   BP 135/80   Pulse 88   Temp 98.3 F (36.8 C)   Wt 138 lb (62.6 kg)   SpO2 99%   BMI 23.69 kg/m  Vitals and nursing note reviewed.  General: well nourished, well developed, NAD with non-toxic appearance HEENT: normocephalic, atraumatic, moist mucous membranes Neck: supple, non-tender without lymphadenopathy, positive hepatojugular reflex Cardiovascular: regular rate and rhythm without murmurs, rubs, or gallops Lungs: clear to auscultation bilaterally with normal work of breathing Abdomen: soft, non-tender, non-distended, normoactive bowel sounds Skin: warm, dry, no rashes or lesions, cap refill < 2 seconds Extremities: warm and well perfused, normal tone, 2+ pitting  edema below knees bilaterally  Assessment & Plan   Symptomatic anemia Acute.  Resolved status post 2 units pBRC.  She is asymptomatic on my exam.  She does have some signs of fluid overload, however her weight appears to be down recently 10 pounds from 2 weeks ago.  Her blood pressure is better controlled when compared to her history.  No clear signs of source of symptomatic anemia.  No prior history of colorectal cancer screening. - Checking CBC with differential - Ambulatory referral to GI for diagnostic colonoscopy, patient given contact information - Reviewed return precautions, RTC 1 month or sooner if needed  Primary hypertension Chronic.  Controlled.  Currently on multiple medications including recently restarting amlodipine by nephrology and hydralazine since hospital discharge. - Continue Lasix 40 mg twice daily, hydralazine 25 mg 3 times daily, and amlodipine 2.5 mg daily - Follow-up with nephrology  Orders Placed This Encounter  Procedures  . CBC With Differential  . Ambulatory referral to Gastroenterology    Referral Priority:   Routine    Referral Type:   Consultation    Referral Reason:   Specialty Services Required    Number of Visits Requested:   1   No orders of the defined types were placed in this encounter.   Harriet Butte, [place], PGY-3 06/28/2018, 3:38 PM

## 2018-06-28 NOTE — Patient Instructions (Signed)
Thank you for coming in to see [place] today. Please see below to review our plan for today's visit.  1.  You should receive a call from the GI doctor within the next month to schedule the appointment for a diagnostic colonoscopy.  I did provide you a form with the [facility] phone number in case you need to reach out to them.  If you experience any symptoms such as passing out, shortness of breath or chest pain, significant fatigue, seek medical attention. 2.  I will call you when I receive your blood results. 3.  We will see you again in 1 month.  Please call the clinic at [phone] if your symptoms worsen or you have any concerns. It was our pleasure to serve you.  Harriet Butte, [place], PGY-3

## 2018-06-28 NOTE — Assessment & Plan Note (Signed)
Chronic.  Controlled.  Currently on multiple medications including recently restarting amlodipine by nephrology and hydralazine since hospital discharge. - Continue Lasix 40 mg twice daily, hydralazine 25 mg 3 times daily, and amlodipine 2.5 mg daily - Follow-up with nephrology

## 2018-06-29 ENCOUNTER — Encounter: Payer: Self-pay | Admitting: Family Medicine

## 2018-06-29 LAB — CBC WITH DIFFERENTIAL
BASOS: 1 %
Basophils Absolute: 0.1 10*3/uL (ref 0.0–0.2)
EOS (ABSOLUTE): 0.1 10*3/uL (ref 0.0–0.4)
Eos: 1 %
Hematocrit: 30.3 % — ABNORMAL LOW (ref 34.0–46.6)
Hemoglobin: 9.6 g/dL — ABNORMAL LOW (ref 11.1–15.9)
Immature Grans (Abs): 0 10*3/uL (ref 0.0–0.1)
Immature Granulocytes: 0 %
LYMPHS: 33 %
Lymphocytes Absolute: 3.2 10*3/uL — ABNORMAL HIGH (ref 0.7–3.1)
MCH: 28.7 pg (ref 26.6–33.0)
MCHC: 31.7 g/dL (ref 31.5–35.7)
MCV: 90 fL (ref 79–97)
Monocytes Absolute: 0.5 10*3/uL (ref 0.1–0.9)
Monocytes: 5 %
Neutrophils Absolute: 5.8 10*3/uL (ref 1.4–7.0)
Neutrophils: 60 %
RBC: 3.35 x10E6/uL — ABNORMAL LOW (ref 3.77–5.28)
RDW: 13.5 % (ref 11.7–15.4)
WBC: 9.7 10*3/uL (ref 3.4–10.8)

## 2018-07-01 ENCOUNTER — Other Ambulatory Visit: Payer: Self-pay | Admitting: Family Medicine

## 2018-07-03 ENCOUNTER — Encounter: Payer: Self-pay | Admitting: Gastroenterology

## 2018-07-31 ENCOUNTER — Ambulatory Visit: Payer: Self-pay | Admitting: Gastroenterology

## 2018-08-02 ENCOUNTER — Ambulatory Visit: Payer: Self-pay | Admitting: Family Medicine

## 2018-09-08 ENCOUNTER — Other Ambulatory Visit: Payer: Self-pay

## 2018-09-08 ENCOUNTER — Encounter: Payer: Self-pay | Admitting: Podiatry

## 2018-09-08 ENCOUNTER — Ambulatory Visit: Payer: Medicaid Other | Admitting: Podiatry

## 2018-09-08 VITALS — Temp 97.9°F

## 2018-09-08 DIAGNOSIS — B351 Tinea unguium: Secondary | ICD-10-CM

## 2018-09-08 DIAGNOSIS — M79674 Pain in right toe(s): Secondary | ICD-10-CM

## 2018-09-08 DIAGNOSIS — M79675 Pain in left toe(s): Secondary | ICD-10-CM

## 2018-09-08 DIAGNOSIS — E1142 Type 2 diabetes mellitus with diabetic polyneuropathy: Secondary | ICD-10-CM

## 2018-09-08 DIAGNOSIS — L84 Corns and callosities: Secondary | ICD-10-CM

## 2018-09-17 NOTE — Progress Notes (Signed)
Subjective:  Andrea Glenn presents to clinic today with cc of  painful, thick, discolored, elongated toenails 1-5 b/l that become tender and cannot cut because of thickness.  Blood sugar this morning was 90 mg/dl per patient.  She continues to have neuropathy symptoms.  Pain is aggravated when wearing enclosed shoe gear.  [place] Bing, DO is her PCP.   She states she likes the AmLactin lotion and it has made a difference in the appearance of her skin.  She voices no new pedal concerns on today's visit.   Current Outpatient Medications:  .  acetaminophen (TYLENOL) 500 MG tablet, Take 1,000 mg by mouth every 6 (six) hours as needed (for pain or headaches). , Disp: , Rfl:  .  amLODipine (NORVASC) 2.5 MG tablet, Take 2.5 mg by mouth daily., Disp: , Rfl:  .  ammonium lactate (AMLACTIN) 12 % lotion, Apply 1 application topically daily. Apply to both feet daily, Disp: 400 g, Rfl: 0 .  bisacodyl (DULCOLAX) 5 MG EC tablet, Take 1 tablet (5 mg total) by mouth daily as needed for moderate constipation., Disp: 30 tablet, Rfl: 0 .  capsaicin (ZOSTRIX) 0.025 % cream, Apply topically 2 (two) times daily as needed (pain)., Disp: 60 g, Rfl: 0 .  clotrimazole (LOTRIMIN) 1 % cream, Apply 1 application topically 2 (two) times daily., Disp: 60 g, Rfl: 0 .  diphenhydrAMINE (BENADRYL ALLERGY) 25 mg capsule, Take 1 capsule (25 mg total) by mouth every 6 (six) hours as needed., Disp: 30 capsule, Rfl: 0 .  Dulaglutide 1.5 MG/0.5ML SOPN, Inject 1.5 mg into the skin once a week., Disp: 4 pen, Rfl: 11 .  DULoxetine (CYMBALTA) 30 MG capsule, Take 1 capsule (30 mg total) by mouth daily., Disp: 30 capsule, Rfl: 2 .  Elastic Bandages & Supports (MEDICAL COMPRESSION STOCKINGS) MISC, 1 Package by Does not apply route daily., Disp: 1 each, Rfl: 0 .  FLUoxetine (PROZAC) 20 MG capsule, Take 1 capsule (20 mg total) by mouth daily., Disp: 90 capsule, Rfl: 3 .  furosemide (LASIX) 40 MG tablet, Take 1 tablet (40 mg total)  by mouth 2 (two) times daily., Disp: 30 tablet, Rfl: 0 .  hydrOXYzine (ATARAX/VISTARIL) 25 MG tablet, Take 1 tablet (25 mg total) by mouth 3 (three) times daily as needed. (Patient taking differently: Take 25 mg by mouth 3 (three) times daily as needed for itching. ), Disp: 60 tablet, Rfl: 0 .  levocetirizine (XYZAL) 5 MG tablet, TAKE 1 TABLET BY MOUTH EVERY EVENING *NOT USE WITH BENADRYL**, Disp: 30 tablet, Rfl: 0 .  polyvinyl alcohol (LIQUIFILM TEARS) 1.4 % ophthalmic solution, Place 2 drops into both eyes 4 (four) times daily., Disp: 15 mL, Rfl: 0 .  sertraline (ZOLOFT) 50 MG tablet, Take 1 tablet (50 mg total) by mouth daily., Disp: 30 tablet, Rfl: 0 .  tacrolimus (PROGRAF) 1 MG capsule, Take 1 mg by mouth 2 (two) times daily., Disp: , Rfl:  .  triamcinolone cream (KENALOG) 0.1 %, Apply 1 application topically 2 (two) times daily., Disp: 453.6 g, Rfl: 0 .  hydrALAZINE (APRESOLINE) 25 MG tablet, Take 1 tablet (25 mg total) by mouth 3 (three) times daily for 30 days., Disp: 90 tablet, Rfl: 0   Allergies  Allergen Reactions  . Atorvastatin Other (See Comments)    Suicidal thoughts  . Statins Other (See Comments)    This class of meds causes "suicidal thoughts"  . Amitriptyline Other (See Comments)    Depression  . Metformin And Related Diarrhea  .  Adhesive [Tape] Other (See Comments)    "Sometimes peels off my skin"  . Gabapentin Other (See Comments)    "Gives me the shakes"     Objective: Vitals:   09/08/18 1301  Temp: 97.9 F (36.6 C)    Physical Examination:  Vascular Examination: Capillary refill time immediate x 10 digits.  DP pulses 2/4 b/l.  PT pulses 1/4 b/l.  Digital hair absent b/l.  No edema noted b/l.  Skin temperature gradient WNL b/l.  Dermatological Examination: Skin with normal turgor, texture and tone b/l.  No open wounds b/l.  No interdigital macerations noted b/l.  Elongated, thick, discolored brittle toenails with subungual debris and pain on  dorsal palpation of nailbeds 1-5 b/l.  Hyperkeratotic lesion submet head 1 right foot with tenderness to palpation. No edema, no erythema, no drainage, no flocculence.  Musculoskeletal Examination: Muscle strength 5/5 to all muscle groups b/l  No pain, crepitus or joint discomfort with active/passive ROM.  Neurological Examination: Sensation intact 5/5 b/l with 10 gram monofilament.  Vibratory sensation intact b/l.  Proprioceptive sensation intact b/l.  Assessment: Mycotic nail infection with pain 1-5 b/l Callus submet head 1 right foot NIDDM with subjective neuropathy  Plan: 1. Toenails 1-5 b/l were debrided in length and girth without iatrogenic laceration. 2. Calluses pared submetatarsal head(s) 1 right foot utilizing sterile scalpel blade without incident. 3. Continue soft, supportive shoe gear daily. 4. Report any pedal injuries to medical professional. 5. Follow up 3 months. 6. Patient/POA to call should there be a question/concern in there interim.

## 2018-09-30 IMAGING — CT CT ABD-PELV W/ CM
2 of 5 series · 16 of 46 positions shown, 18 images · IV contrast (Omni 300)
Comparison: Abdominal CT dated 01/15/2008

CLINICAL DATA: 56-year-old female with abdominal pain and elevated
LFTs.

EXAM:
CT ABDOMEN AND PELVIS WITH CONTRAST
TECHNIQUE: Multidetector CT imaging of the abdomen and pelvis was performed
using the standard protocol following bolus administration of
intravenous contrast.
CONTRAST:  100mL R31EKV-LII IOPAMIDOL (R31EKV-LII) INJECTION 61%

[Series 4: a/p w/ 5mm · axial · 0.74mm/px · z∈[+814,+1158]mm · 13 of 79 slices shown, 15 images]
[im 5/79  soft-tissue]
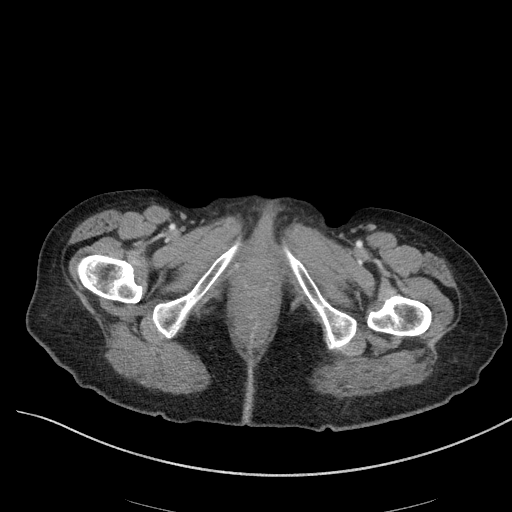
[im 5/79  bone]
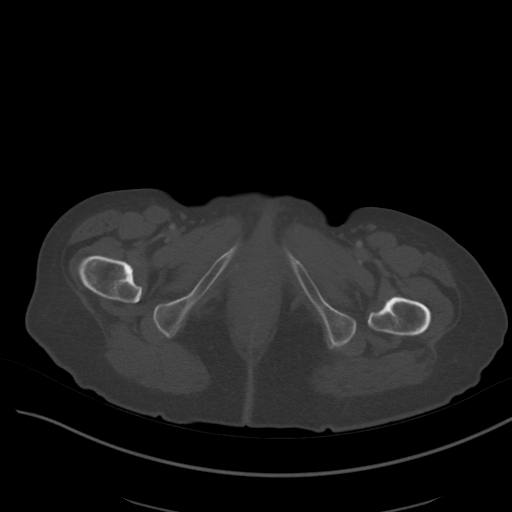
[im 13/79  soft-tissue]
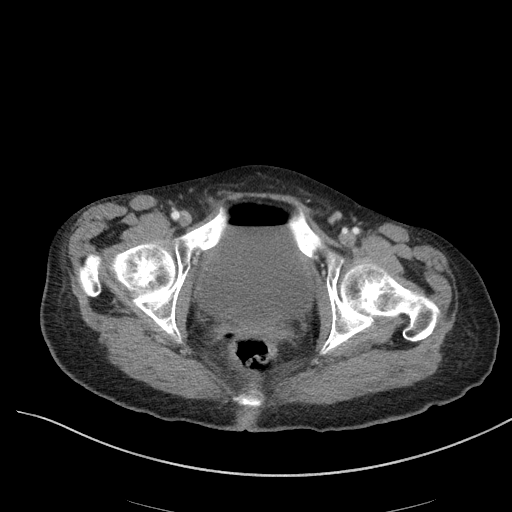
[im 17/79  soft-tissue]
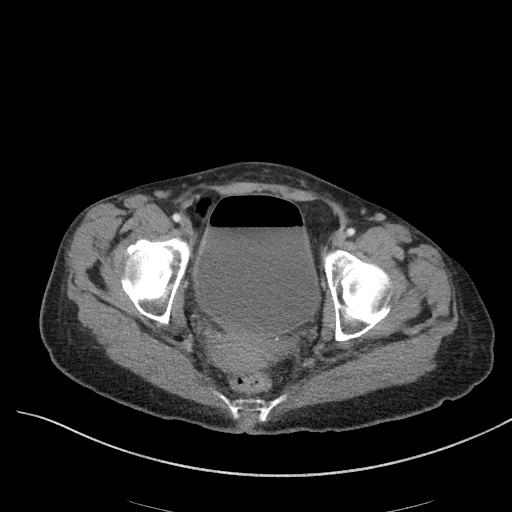
[im 21/79  soft-tissue]
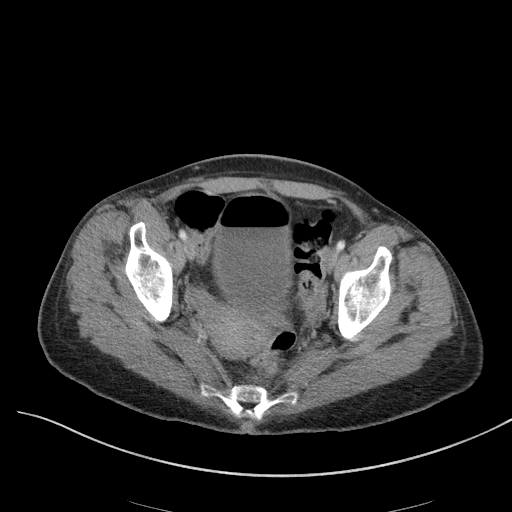
[im 29/79  soft-tissue]
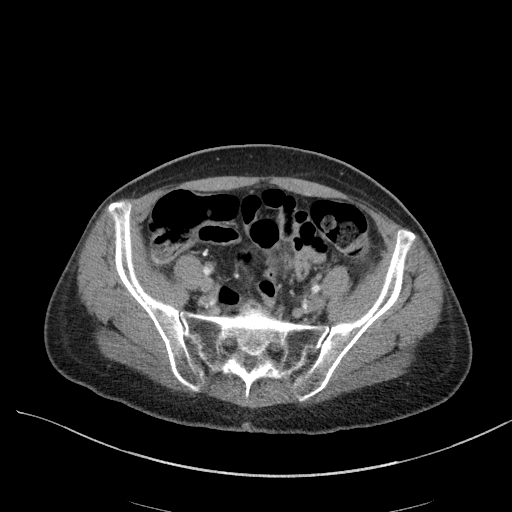
[im 33/79  soft-tissue]
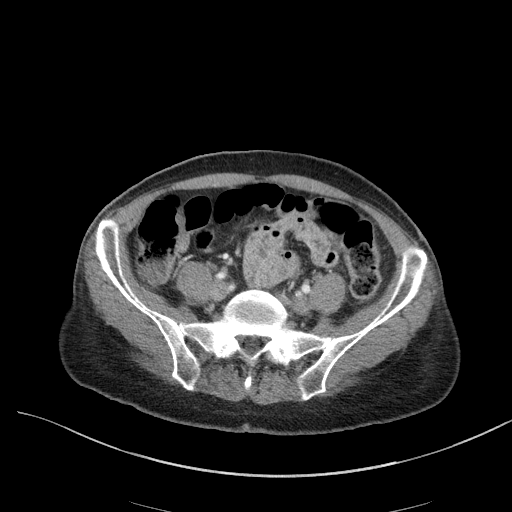
[im 42/79  soft-tissue]
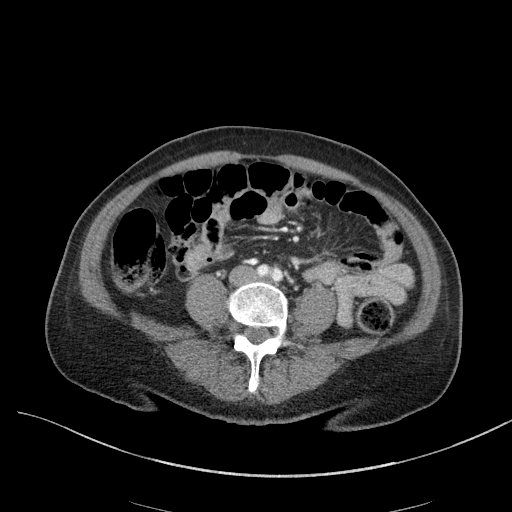
[im 46/79  soft-tissue]
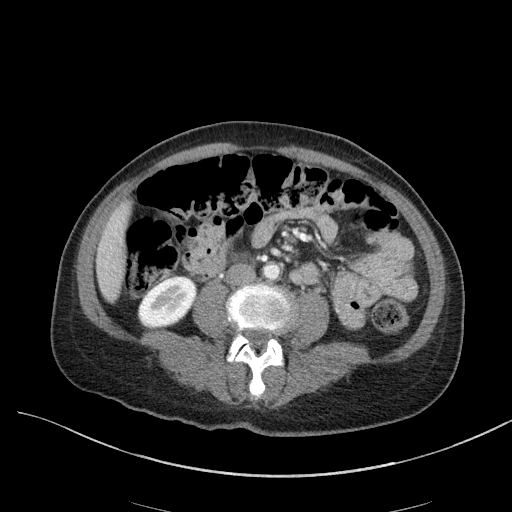
[im 50/79  soft-tissue]
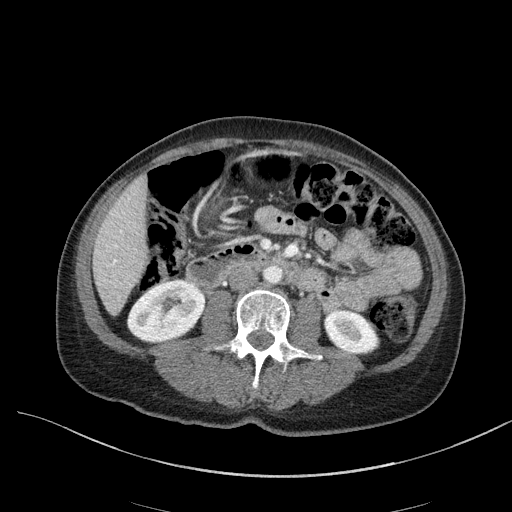
[im 50/79  bone]
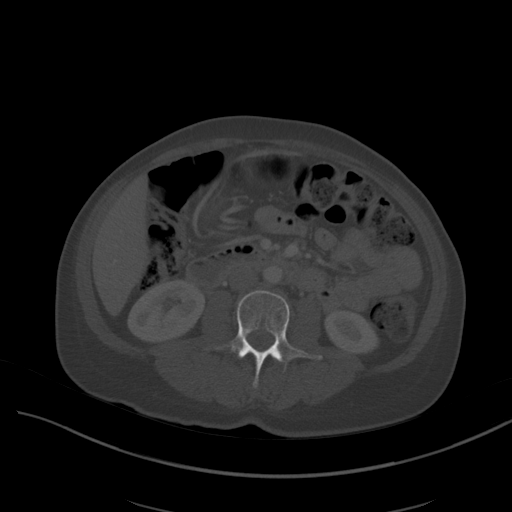
[im 58/79  soft-tissue]
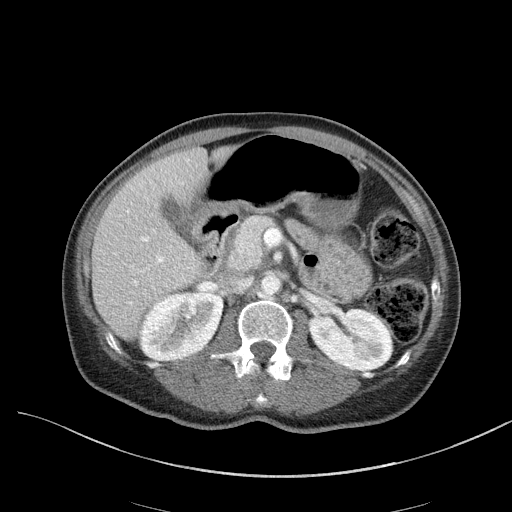
[im 62/79  soft-tissue]
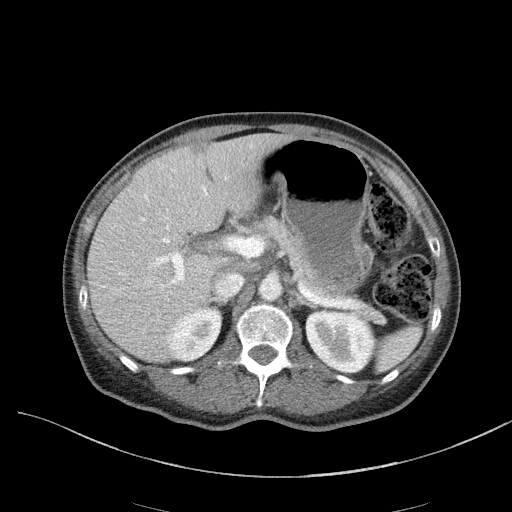
[im 66/79  soft-tissue]
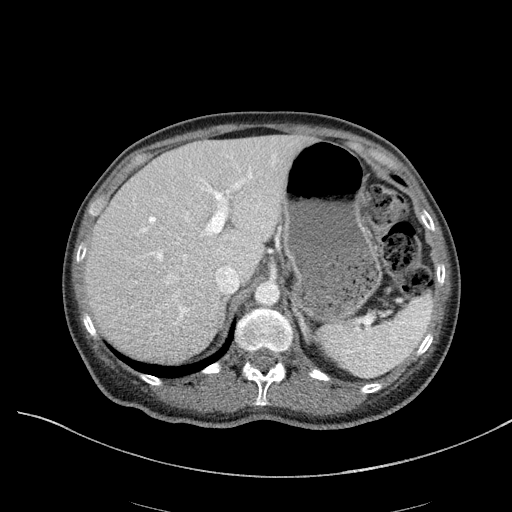
[im 74/79  soft-tissue]
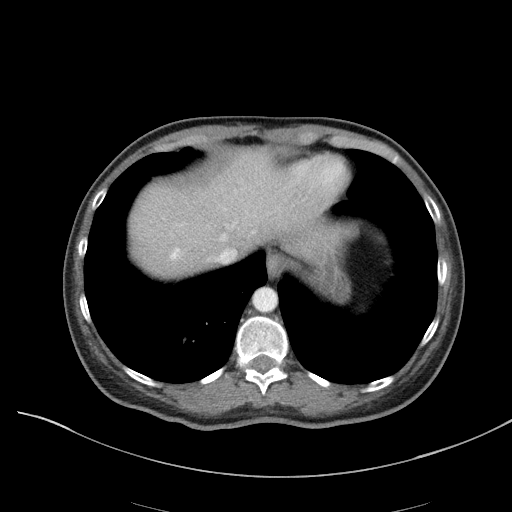

[Series 7: a/p w/ cor · coronal · 0.77mm/px · 3 of 143 slices shown]
[im 48/143  soft-tissue]
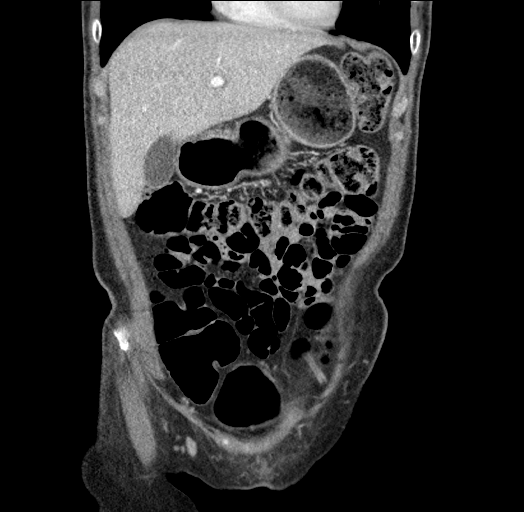
[im 64/143  soft-tissue]
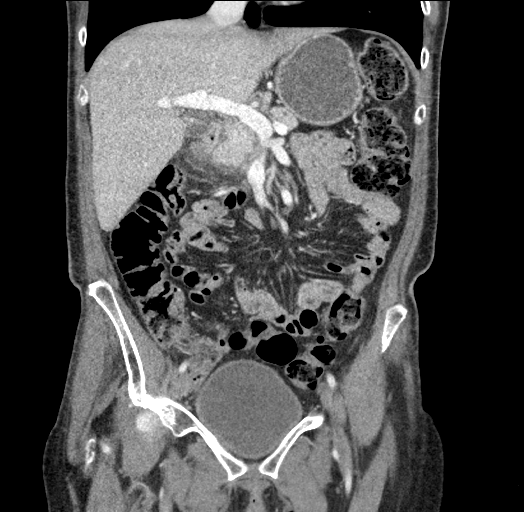
[im 79/143  soft-tissue]
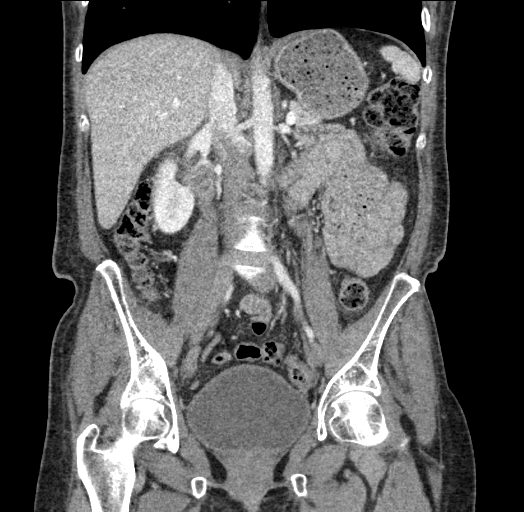

[16 of 46 positions shown; findings below may reference images not displayed]

FINDINGS: Lower chest: The visualized lung bases are clear.

No intra-abdominal free air or free fluid.

Hepatobiliary: No focal liver abnormality is seen. No gallstones,
gallbladder wall thickening, or biliary dilatation.

Pancreas: There is inflammatory changes of the pancreas primarily
involving the dx head and uncinate process of the pancreas most
consistent with acute pancreatitis. Correlation with pancreatic
enzymes recommended. There is no drainable fluid collection or
abscess or pseudocyst. No dilatation of the main pancreatic duct.

Spleen: Normal in size without focal abnormality.

Adrenals/Urinary Tract: The kidneys, adrenal glands, and visualized
ureters appear unremarkable. The urinary bladder is moderately
distended. Air within the urinary bladder noted which may be related
to recent instrumentation. Clinical correlation is recommended.

Stomach/Bowel: There is moderate stool throughout the colon. No
evidence of bowel obstruction or active inflammation. Normal
appendix

Vascular/Lymphatic: There is mild aortoiliac atherosclerotic
disease. No aneurysmal dilatation or evidence of dissection. The
origins of the celiac axis, SMA, IMA are patent. There is a
circumaortic left renal vein anatomy. The SMV, splenic vein, and
main portal vein are patent. No portal venous gas identified. No
adenopathy.

Reproductive: The uterus and ovaries are grossly unremarkable.

Other: None

Musculoskeletal: No acute or significant osseous findings.
IMPRESSION: 1. Acute pancreatitis.  No abscess.
2. No bowel obstruction.  Normal appendix.
3.  Aortic Atherosclerosis ([id number]).

## 2018-11-20 ENCOUNTER — Telehealth (INDEPENDENT_AMBULATORY_CARE_PROVIDER_SITE_OTHER): Payer: Medicaid Other | Admitting: Family Medicine

## 2018-11-20 ENCOUNTER — Other Ambulatory Visit: Payer: Self-pay

## 2018-11-20 DIAGNOSIS — K920 Hematemesis: Secondary | ICD-10-CM

## 2018-11-20 NOTE — Progress Notes (Signed)
[place] Telemedicine Visit  Patient consented to have virtual visit. Method of visit: Telephone  Encounter participants: Patient: ANEISHA SKYLES - located at home Provider: Bonnita Hollow - located at office Others (if applicable): none        Chief Complaint: Vomitting  HPI: VALYNCIA WIENS is a 58 y.o. female who presents with presents with 1 month of vomiting.  Last time she vomited was this morning.  Patient says that she has been vomiting blood.  Denies any weight loss.  Able to tolerate very minimal p.o. due to nausea.  Patient also endorses side pain.  Patient states that she has been stressed for about 1 week the loss of her father.  Does not endorse the symptoms are getting worse.  Patient lives alone most of the day and cannot drive due to severe peripheral neuropathy from her diabetes.  Patient takes Pepto-Bismol which does help some with the symptoms.  ROS: per HPI  Pertinent PMHx: Diabetes  Exam:  Respiratory: Able to speak in full sentences without issue  Assessment/Plan: Hematemesis with abdominal pain Patient has 1 month of abdominal pain and vomiting and has recently been vomiting blood.  Able to tolerate minimal p.o.  Symptoms minimally controlled with bismuth.  Discussed that the nature of her complaint is more complex than needs to be handed over the phone and patient needs to come in to be assessed for further work-up.  Discussed that if symptoms worsened such as vomiting more blood, feeling lightheaded, develops chest pain or any other worrisome symptoms that patient needs to go to the emergency department via 911.  Patient said that she would like to be seen in the office.  But that she needed to coordinate her schedule with a ride.  She was going to call back the front office to set up an appointment once she determines her schedule.  Time spent during visit with patient: 6 minutes

## 2018-11-22 ENCOUNTER — Observation Stay (HOSPITAL_COMMUNITY): Payer: Medicaid Other

## 2018-11-22 ENCOUNTER — Encounter: Payer: Self-pay | Admitting: Family Medicine

## 2018-11-22 ENCOUNTER — Inpatient Hospital Stay (HOSPITAL_COMMUNITY)
Admission: AD | Admit: 2018-11-22 | Discharge: 2018-11-24 | DRG: 684 | Disposition: A | Discharge status: Home / Self Care | Payer: Medicaid Other | Source: Ambulatory Visit | Attending: Family Medicine | Admitting: Family Medicine

## 2018-11-22 ENCOUNTER — Other Ambulatory Visit: Payer: Self-pay

## 2018-11-22 ENCOUNTER — Ambulatory Visit (INDEPENDENT_AMBULATORY_CARE_PROVIDER_SITE_OTHER): Payer: Medicaid Other | Admitting: Family Medicine

## 2018-11-22 ENCOUNTER — Encounter (HOSPITAL_COMMUNITY): Payer: Self-pay | Admitting: General Practice

## 2018-11-22 VITALS — BP 108/70 | HR 75 | Temp 98.7°F | Wt 112.8 lb

## 2018-11-22 DIAGNOSIS — Z833 Family history of diabetes mellitus: Secondary | ICD-10-CM

## 2018-11-22 DIAGNOSIS — I10 Essential (primary) hypertension: Secondary | ICD-10-CM

## 2018-11-22 DIAGNOSIS — D638 Anemia in other chronic diseases classified elsewhere: Secondary | ICD-10-CM

## 2018-11-22 DIAGNOSIS — N049 Nephrotic syndrome with unspecified morphologic changes: Secondary | ICD-10-CM

## 2018-11-22 DIAGNOSIS — E1142 Type 2 diabetes mellitus with diabetic polyneuropathy: Secondary | ICD-10-CM | POA: Diagnosis present

## 2018-11-22 DIAGNOSIS — N189 Chronic kidney disease, unspecified: Secondary | ICD-10-CM

## 2018-11-22 DIAGNOSIS — M7989 Other specified soft tissue disorders: Secondary | ICD-10-CM | POA: Diagnosis present

## 2018-11-22 DIAGNOSIS — N179 Acute kidney failure, unspecified: Principal | ICD-10-CM | POA: Diagnosis present

## 2018-11-22 DIAGNOSIS — E1129 Type 2 diabetes mellitus with other diabetic kidney complication: Secondary | ICD-10-CM | POA: Diagnosis present

## 2018-11-22 DIAGNOSIS — Z8249 Family history of ischemic heart disease and other diseases of the circulatory system: Secondary | ICD-10-CM

## 2018-11-22 DIAGNOSIS — W19XXXA Unspecified fall, initial encounter: Secondary | ICD-10-CM | POA: Diagnosis present

## 2018-11-22 DIAGNOSIS — R112 Nausea with vomiting, unspecified: Secondary | ICD-10-CM | POA: Diagnosis present

## 2018-11-22 DIAGNOSIS — R600 Localized edema: Secondary | ICD-10-CM | POA: Diagnosis present

## 2018-11-22 DIAGNOSIS — R231 Pallor: Secondary | ICD-10-CM

## 2018-11-22 DIAGNOSIS — R0781 Pleurodynia: Secondary | ICD-10-CM

## 2018-11-22 DIAGNOSIS — E1122 Type 2 diabetes mellitus with diabetic chronic kidney disease: Secondary | ICD-10-CM | POA: Diagnosis present

## 2018-11-22 DIAGNOSIS — N184 Chronic kidney disease, stage 4 (severe): Secondary | ICD-10-CM | POA: Diagnosis present

## 2018-11-22 DIAGNOSIS — E1165 Type 2 diabetes mellitus with hyperglycemia: Secondary | ICD-10-CM | POA: Diagnosis present

## 2018-11-22 DIAGNOSIS — Z20828 Contact with and (suspected) exposure to other viral communicable diseases: Secondary | ICD-10-CM | POA: Diagnosis present

## 2018-11-22 DIAGNOSIS — E8770 Fluid overload, unspecified: Secondary | ICD-10-CM | POA: Diagnosis not present

## 2018-11-22 DIAGNOSIS — F17200 Nicotine dependence, unspecified, uncomplicated: Secondary | ICD-10-CM | POA: Diagnosis present

## 2018-11-22 DIAGNOSIS — G2581 Restless legs syndrome: Secondary | ICD-10-CM | POA: Diagnosis present

## 2018-11-22 DIAGNOSIS — E0821 Diabetes mellitus due to underlying condition with diabetic nephropathy: Secondary | ICD-10-CM | POA: Diagnosis not present

## 2018-11-22 DIAGNOSIS — E1121 Type 2 diabetes mellitus with diabetic nephropathy: Secondary | ICD-10-CM | POA: Diagnosis not present

## 2018-11-22 DIAGNOSIS — Z9119 Patient's noncompliance with other medical treatment and regimen: Secondary | ICD-10-CM

## 2018-11-22 DIAGNOSIS — D631 Anemia in chronic kidney disease: Secondary | ICD-10-CM | POA: Diagnosis present

## 2018-11-22 DIAGNOSIS — Z888 Allergy status to other drugs, medicaments and biological substances status: Secondary | ICD-10-CM

## 2018-11-22 DIAGNOSIS — Z79899 Other long term (current) drug therapy: Secondary | ICD-10-CM

## 2018-11-22 DIAGNOSIS — I129 Hypertensive chronic kidney disease with stage 1 through stage 4 chronic kidney disease, or unspecified chronic kidney disease: Secondary | ICD-10-CM | POA: Diagnosis present

## 2018-11-22 DIAGNOSIS — R1110 Vomiting, unspecified: Secondary | ICD-10-CM | POA: Diagnosis present

## 2018-11-22 DIAGNOSIS — E785 Hyperlipidemia, unspecified: Secondary | ICD-10-CM

## 2018-11-22 DIAGNOSIS — Z841 Family history of disorders of kidney and ureter: Secondary | ICD-10-CM

## 2018-11-22 DIAGNOSIS — E1169 Type 2 diabetes mellitus with other specified complication: Secondary | ICD-10-CM | POA: Diagnosis present

## 2018-11-22 DIAGNOSIS — F17210 Nicotine dependence, cigarettes, uncomplicated: Secondary | ICD-10-CM | POA: Diagnosis present

## 2018-11-22 DIAGNOSIS — F329 Major depressive disorder, single episode, unspecified: Secondary | ICD-10-CM | POA: Diagnosis present

## 2018-11-22 DIAGNOSIS — Z9109 Other allergy status, other than to drugs and biological substances: Secondary | ICD-10-CM

## 2018-11-22 HISTORY — DX: Chronic kidney disease, unspecified: N18.9

## 2018-11-22 HISTORY — DX: Tremor, unspecified: R25.1

## 2018-11-22 HISTORY — DX: Unspecified fall, initial encounter: W19.XXXA

## 2018-11-22 LAB — POCT GLYCOSYLATED HEMOGLOBIN (HGB A1C): HbA1c, POC (controlled diabetic range): 5.9 % (ref 0.0–7.0)

## 2018-11-22 LAB — COMPREHENSIVE METABOLIC PANEL
ALT: 10 U/L (ref 0–44)
AST: 22 U/L (ref 15–41)
Albumin: 2 g/dL — ABNORMAL LOW (ref 3.5–5.0)
Alkaline Phosphatase: 69 U/L (ref 38–126)
Anion gap: 8 (ref 5–15)
BUN: 28 mg/dL — ABNORMAL HIGH (ref 6–20)
CO2: 24 mmol/L (ref 22–32)
Calcium: 8.6 mg/dL — ABNORMAL LOW (ref 8.9–10.3)
Chloride: 111 mmol/L (ref 98–111)
Creatinine, Ser: 3.18 mg/dL — ABNORMAL HIGH (ref 0.44–1.00)
GFR calc Af Amer: 18 mL/min — ABNORMAL LOW (ref >60)
GFR calc non Af Amer: 15 mL/min — ABNORMAL LOW (ref >60)
Glucose, Bld: 196 mg/dL — ABNORMAL HIGH (ref 70–99)
Potassium: 4.5 mmol/L (ref 3.5–5.1)
Sodium: 143 mmol/L (ref 135–145)
Total Bilirubin: 0.6 mg/dL (ref 0.3–1.2)
Total Protein: 5.8 g/dL — ABNORMAL LOW (ref 6.5–8.1)

## 2018-11-22 LAB — CBC WITH DIFFERENTIAL/PLATELET
Abs Immature Granulocytes: 0.02 10*3/uL (ref 0.00–0.07)
Basophils Absolute: 0.1 10*3/uL (ref 0.0–0.1)
Basophils Relative: 1 %
Eosinophils Absolute: 0.1 10*3/uL (ref 0.0–0.5)
Eosinophils Relative: 1 %
HCT: 30.3 % — ABNORMAL LOW (ref 36.0–46.0)
Hemoglobin: 9.2 g/dL — ABNORMAL LOW (ref 12.0–15.0)
Immature Granulocytes: 0 %
Lymphocytes Relative: 34 %
Lymphs Abs: 3.4 10*3/uL (ref 0.7–4.0)
MCH: 28.2 pg (ref 26.0–34.0)
MCHC: 30.4 g/dL (ref 30.0–36.0)
MCV: 92.9 fL (ref 80.0–100.0)
Monocytes Absolute: 0.4 10*3/uL (ref 0.1–1.0)
Monocytes Relative: 4 %
Neutro Abs: 5.9 10*3/uL (ref 1.7–7.7)
Neutrophils Relative %: 60 %
Platelets: 265 10*3/uL (ref 150–400)
RBC: 3.26 MIL/uL — ABNORMAL LOW (ref 3.87–5.11)
RDW: 13.9 % (ref 11.5–15.5)
WBC: 10 10*3/uL (ref 4.0–10.5)
nRBC: 0 % (ref 0.0–0.2)

## 2018-11-22 LAB — GLUCOSE, POCT (MANUAL RESULT ENTRY): POC Glucose: 104 mg/dl — AB (ref 70–99)

## 2018-11-22 LAB — LIPASE, BLOOD: Lipase: 23 U/L (ref 11–51)

## 2018-11-22 LAB — POCT HEMOGLOBIN: Hemoglobin: 8.1 g/dL — AB (ref 11–14.6)

## 2018-11-22 MED ORDER — FUROSEMIDE 40 MG PO TABS: 60.0000 mg | ORAL_TABLET | Freq: Two times a day (BID) | ORAL | Status: DC

## 2018-11-22 MED ORDER — POLYETHYLENE GLYCOL 3350 17 G PO PACK: 17.0000 g | PACK | Freq: Every day | ORAL | Status: DC | PRN

## 2018-11-22 MED ORDER — AMLODIPINE BESYLATE 2.5 MG PO TABS
2.5000 mg | ORAL_TABLET | Freq: Every day | ORAL | Status: DC
  Administered 2018-11-22: 23:00:00 2.5 mg via ORAL
  Filled 2018-11-22: qty 1

## 2018-11-22 MED ORDER — ONDANSETRON HCL 4 MG/2ML IJ SOLN: 4.0000 mg | Freq: Four times a day (QID) | INTRAMUSCULAR | Status: DC | PRN

## 2018-11-22 MED ORDER — HEPARIN SODIUM (PORCINE) 5000 UNIT/ML IJ SOLN
5000.0000 [IU] | Freq: Three times a day (TID) | INTRAMUSCULAR | Status: DC
  Administered 2018-11-22 – 2018-11-24 (×5): 5000 [IU] via SUBCUTANEOUS
  Filled 2018-11-22 (×4): qty 1

## 2018-11-22 MED ORDER — SERTRALINE HCL 50 MG PO TABS
50.0000 mg | ORAL_TABLET | Freq: Every day | ORAL | Status: DC
  Filled 2018-11-22: qty 1

## 2018-11-22 MED ORDER — NEPRO/CARBSTEADY PO LIQD
237.0000 mL | Freq: Two times a day (BID) | ORAL | Status: DC
  Administered 2018-11-23 – 2018-11-24 (×3): 237 mL via ORAL

## 2018-11-22 MED ORDER — FLUOXETINE HCL 20 MG PO CAPS
20.0000 mg | ORAL_CAPSULE | Freq: Every day | ORAL | Status: DC
  Administered 2018-11-23 – 2018-11-24 (×2): 20 mg via ORAL
  Filled 2018-11-22 (×3): qty 1

## 2018-11-22 MED ORDER — TACROLIMUS 1 MG PO CAPS
1.0000 mg | ORAL_CAPSULE | Freq: Two times a day (BID) | ORAL | Status: DC
  Administered 2018-11-22 – 2018-11-23 (×2): 1 mg via ORAL
  Filled 2018-11-22 (×2): qty 1

## 2018-11-22 MED ORDER — HYDRALAZINE HCL 25 MG PO TABS
25.0000 mg | ORAL_TABLET | Freq: Three times a day (TID) | ORAL | Status: DC
  Administered 2018-11-22 – 2018-11-24 (×6): 25 mg via ORAL
  Filled 2018-11-22 (×7): qty 1

## 2018-11-22 MED ORDER — FUROSEMIDE 40 MG PO TABS: 40.0000 mg | ORAL_TABLET | Freq: Two times a day (BID) | ORAL | Status: DC

## 2018-11-22 MED ORDER — ONDANSETRON HCL 4 MG PO TABS: 4.0000 mg | ORAL_TABLET | Freq: Four times a day (QID) | ORAL | Status: DC | PRN

## 2018-11-22 MED ORDER — HYDROXYZINE HCL 25 MG PO TABS: 25.0000 mg | ORAL_TABLET | Freq: Three times a day (TID) | ORAL | Status: DC | PRN

## 2018-11-22 MED ORDER — TACROLIMUS 1 MG PO CAPS: 2.0000 mg | ORAL_CAPSULE | Freq: Every day | ORAL | Status: DC

## 2018-11-22 MED ORDER — LIDOCAINE 5 % EX PTCH
1.0000 | MEDICATED_PATCH | CUTANEOUS | Status: AC
  Administered 2018-11-22: 1 via TRANSDERMAL
  Filled 2018-11-22: qty 1

## 2018-11-22 MED ORDER — NICOTINE 14 MG/24HR TD PT24
14.0000 mg | MEDICATED_PATCH | Freq: Every day | TRANSDERMAL | Status: DC
  Filled 2018-11-22: qty 1

## 2018-11-22 MED ORDER — TACROLIMUS 1 MG PO CAPS: 1.0000 mg | ORAL_CAPSULE | Freq: Every day | ORAL | Status: DC

## 2018-11-22 MED ORDER — ONDANSETRON 4 MG PO TBDP
4.0000 mg | ORAL_TABLET | Freq: Once | ORAL | Status: AC
  Administered 2018-11-22: 4 mg via ORAL

## 2018-11-22 NOTE — Progress Notes (Signed)
Subjective:  Andrea Glenn is a 58 y.o. female who presents to the [facility] today with a chief complaint of ongoing vomiting.   HPI:   Vomiting: Andrea Glenn complains of ongoing intermittent vomiting that occurs 30 minutes after eating for the past month. Has 2-3 episodes of vomiting daily. She has tried [employer] with some relief. Two days ago she had blood streaked emesis.  Has been hungry however she is unable to keep water and food down at times. Denies previous similar symptoms. Denies abdominal pain, chest pain, fever, diarrhea, blood in stools and changes to bowel habits.  No recent sick contacts.  Has hx of pancreatis in 6283, M6QH on Trulicity.   Decreased urinary output:  Hx of CKD stage 4 and FSGS.  States she urinates once per day around 4-5 AM. This has been going on for the past few weeks.  Follows with [facility]   Weight loss: Patient reports losing 10-20 lb in the last month. She has had increased stress since the passing of her father and having to deal with his business affairs.   Fall: Patient tripped on a step and fell from 5th stair onto cement [place] [facility] on 11[place]11/18. Complains of left sided rib pain since fall. Fall was witnessed and family member helped her up.  Has not taken anything for pain.  Pain worse with deep breathing. Denies shortness of breath, hitting head and LOC.    PMHx: pancreatitis 2018, DM with neuropathy, HTN, CKD Stage 4, FSGS, Symptomatic anemia related to CKD SH: Back, foot and shoulder surgeries       Objective:  Physical Exam: BP 108/70   Pulse 75   Temp 98.7 F (37.1 C) (Oral)   Wt 112 lb 12.8 oz (51.2 kg)   SpO2 98%   BMI 19.36 kg/m    GEN:     alert,  no distress    HENT:  mucus membranes moist, oropharyngeal without lesions  EYES:  pupils reactive NECK:  normal ROM RESP:  clear to auscultation bilaterally, no increased work of breathing  CVS:   regular rate and rhythm, no murmur CHEST: left middle ribs  tender to palpation  ABD:  soft, non-tender; bowel sounds present; no palpable masses EXT:   normal ROM NEURO:  normal without focal findings,  speech normal, alert and oriented   Skin:   warm and dry, normal skin turgor, well healing abrasion to left elbow   Results for orders placed or performed in visit on 11/22/18 (from the past 72 hour(s))  HgB A1c     Status: None   Collection Time: 11/22/18 11:52 AM  Result Value Ref Range   Hemoglobin A1C     HbA1c POC (<> result, manual entry)     HbA1c, POC (prediabetic range)     HbA1c, POC (controlled diabetic range) 5.9 0.0 - 7.0 %  Glucose (CBG)     Status: Abnormal   Collection Time: 11/22/18 11:52 AM  Result Value Ref Range   POC Glucose 104 (A) 70 - 99 mg/dl  Hemoglobin     Status: Abnormal   Collection Time: 11/22/18 12:15 PM  Result Value Ref Range   Hemoglobin 8.1 (A) 11 - 14.6 g/dL     Assessment/Plan:  Essential hypertension Patient with soft blood pressures in the clinic that improved on recheck.  Advised patient to hold blood pressure medicines until she is admitted to the hospital.   CKD (chronic kidney disease) Patient with decreased  urinary output for past few weeks.  Reports urinating only per day.  Hx of FSGS on Tacrolimus and symptomatic anemia. Unsure if decreased po intake has caused acute on chronic renal failure.  POC hemoglobin was 8.1.   Nausea & vomiting Patient with 1 month of vomiting with 10-20 lb weight loss.  Patient hungry but afraid she will vomit food. Zofran given in clinic.  Physical exam w/o signs of dehydration however in the setting of decreased urinary output patient was directly admitted to the hospital from clinic to further investigate.  Unknown etiology of vomiting at this time.  Patient with hx of pancreatitis on Trulicity.  Hx of uncontrolled DM; CBG 104  A1C 5.9.   Fall Patient fell on 11/11/18 [place] [facility] from steps.  Impact on left side, elbow and knee. Reports ongoing pain in  ribs.  Recommend rib series during her admission.       Orders Placed This Encounter  Procedures  . HgB A1c  . Glucose (CBG)  . Hemoglobin    Meds ordered this encounter  Medications  . ondansetron (ZOFRAN-ODT) disintegrating tablet 4 mg      Lyndee Hensen, DO PGY-1, [facility] 11/22/2018 1:21 PM

## 2018-11-22 NOTE — Assessment & Plan Note (Addendum)
Patient with decreased urinary output for past few weeks.  Reports urinating only per day.  Hx of FSGS on Tacrolimus and symptomatic anemia. Unsure if decreased po intake has caused acute on chronic renal failure.  POC hemoglobin was 8.1.

## 2018-11-22 NOTE — Assessment & Plan Note (Addendum)
Patient with 1 month of vomiting with 10-20 lb weight loss.  Patient hungry but afraid she will vomit food. Zofran given in clinic.  Physical exam w/o signs of dehydration however in the setting of decreased urinary output patient was directly admitted to the hospital from clinic to further investigate.  Unknown etiology of vomiting at this time.  Patient with hx of pancreatitis on Trulicity.  Hx of uncontrolled DM; CBG 104  A1C 5.9.

## 2018-11-22 NOTE — Patient Instructions (Signed)
You were given a medicine to help with nausea and vomiting. You will receive a phone call to return to the hospital for admission.  If you began to have persistent vomiting and/or abdominal pain, dizziness or feeling like you are going to pass out please go directly to the ED.     If you have questions or concerns please do not hesitate to call at [phone].

## 2018-11-22 NOTE — Assessment & Plan Note (Signed)
Patient with soft blood pressures in the clinic that improved on recheck.  Advised patient to hold blood pressure medicines until she is admitted to the hospital.

## 2018-11-22 NOTE — Progress Notes (Signed)
Was paged by nursing staff due to patient complaining of rib pain and asking for something for relief.  Went and saw patient and patient explained that she had a fall on July 24 and has since had left-sided rib pain especially when laying down.  Her ribs were x-rayed in the ED and showed no fractures or lesions.  Patient reports that she does not like to take medications if possible.  Prescribed a Lidoderm patch to apply over the ribs.

## 2018-11-22 NOTE — Assessment & Plan Note (Signed)
Patient fell on 11/11/18 [place] [facility] from steps.  Impact on left side, elbow and knee. Reports ongoing pain in ribs.  Recommend rib series during her admission.

## 2018-11-22 NOTE — H&P (Addendum)
[facility] Admission History and Physical Service Pager: [phone]  Patient name: Andrea Glenn Medical record number: [id number] Date of birth: June 04, 1960 Age: 58 y.o. Gender: female  Primary Care Provider: Guadalupe Dawn, MD Consultants: Nephrology, Nutrition   Code Status: Full Code  Preferred Emergency Contact: Imogene Burn  Chief Complaint: vomiting for 1 month, weight loss, decreased urine output and pain in left rib cage   Assessment and Plan: Andrea Glenn is a 58 y.o. 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   Emesis Patient reports nonbloody, nonbilious emesis for ~1 month. Reporting 2-3 episodes daily that occurs soon after eating and has appearance like undigested food. Patient notes that food sometimes feels like it gets stuck in her chest before moving down. She denies any pain associated with n/v, fever, dysphagia. Given lack of pain and emesis of undigested food, gastroparesis in the setting of DM 2 is at the top of the differential, as well as esophageal dysmotility or achalasia given patient's reports of food bolus getting stuck in chest. Less likely infectious, toxic, metabolic as course has been 1 month and patient's initial labs are not grossly abnormal. Unlikely to be obstruction as patient is having BM (last two days ago) and patient denies bilious vomiting. Patient denies any history of GERD. Patient does report feeling dizzy at times, which may suggest vestibular neuritis. Unlikely to be medication related as patient has not started any new medications.  Finally, can consider function nausea/vomiting disorders (e.g. cyclic vomiting syndrome). CT A/P was negative for any acute signs of inflammation or obstruction. As patient does not have any pain, lipase and bili wnl, unlikely to be hepatobiliary or pancreatic in nature. Thus,  will defer RUQ U/S at this time. On exam, patient appears euvolemic with labs significant for creatinine of 3, which is acutely elevated since last values in February 2020. Patient follows at [place] and unable to see any more recent labs.  Tonight, will monitor patient's PO intake and UOP.  - Admit to FMTS - Unit: med surg - Attending: Dr. Andria Frames  - Continuous Cardiac monitoring & pulse ox - Vitals per unit protocol - Diet renal/carbmodified, salt restriction, 1200 mL fluid restriction   - Out of bed with assistance, fall risk - Initial Consults: GI in AM  - Esophagram and EGD? - will hold IVFs as patient appears euvolemic on exam and has history of LE edema - Zofran PRN  - can trial metoclopramide + benadryl  -EKG   Decreased Urine output  AKI FSGS  CKD IV  Patient has history of CKD Stage four and reports urinating once per day for last few weeks. Patient followed by [facility]. Patient on home Tacrolimus 2mg  in AM and 1 mg at night. She does not appear volume overloaded on physical exam with minimal lower extremity edema. BUN is only mildly elevated at 28 and Cr 3.18 (acutely elevated from 2.02 in 05/2018), which suggests intrarenal cause.  (This is consistent with known FSGS). Patient reports that she does not want to start dialysis. Her two sisters are on dialysis, mom was on dialysis prior to death and dad just passed away from acute kidney failure. Patient reports drinking a lot of water during the day, but hasn't been able to keep neither fluids or solids down. Will not start on fluids nor lasix at this time. Would like to observe  patient overnight to see how eating goes and make further decision tomorrow. Decreased UOP can be from worseening kidney function, or a result of decreased PO and vomiting.  -consult Nephrology - further recommendations in this patient's care.  -consult GI - potential EGD if esophagram unrevealing.  -daily weights  -bladder scan every shift if no urine output   -Strict IO  -Tacrolimus 2 mg AM, 1 mg PM, amlodipine 2.5mg  BID  - consult [facility] -strict I/Os   Recent Weight loss  Patient reports weight was previously weighing 121 in April weighted and at PCP visit today weighed 112. Patient reports attempting to increase her caloric intake and denies that this weight loss is intentional.  -nutrition consult  -PT /OT  - diet supplement per Dr. Andria Frames  Left Rib Pain 2/2 Fall Patient reports recent mechanical fall within 1 week prior to admission in which she landed on the left side of her chest. Patient reports no difficulty breathing but has tenderness when palpating 3-5 lateral aspect of rib cage. Radiograph negative for any acute findings. Monitor for pain.  - incentive spirometry  - tylenol PRN pain - PT/OT   HTN Amlodipine 2.5mg  and Hydralazine as home medications. BP 137/76 on admission. -patient instructed to hold prior to presenting for direct admit  -will restart Amlodipine if BP elevates overnight   DM Patient on home Dulaglutide 1.5mg  weekly. Currently well controlled. Will hold off on sliding insulin here.  - please notify MD is sugars >180.   MDD: Patient on Zoloft 50 mg daily and Prozac 20mg  -will continue home Zoloft 50mg  and Prozac    Tobacco Use (currently smokes 1 pack in 3-4 days for 20 years)  -smoking cessation education  -Nicotine patch 14mg    FEN/GI: renal diet  -Miralax   Prophylaxis: Hep 5,000 units   Disposition: admit to FPTS for evaluation of decreased urine output and prolonged emesis with weightloss   History of Present Illness:  Andrea Glenn is a 58 y.o. female presenting as direct admit from clinic where she reported about 1 month of daily emesis with 2-3 episdoes per day soon after eating. Patient notes that she has been having trouble keeping food and fluids down. She intermittently feels as if the food gets stuck in her chest and then eventually goes down. Patient denies any difficulty swallowing.  Emesis was not blood tinged until 1 day prior to admission, no gross amount of blood noted. Patient states that she is very hungry now and has been able to tolerate her dinner following arrival to the floor. Patient was taking Pepto Bismol at home and it helped by allowing her to keep some of her meals down. Of note, patient was admitted in Feb 2020 with symptomatic anemia and received 2 units of pRBC transfusion.   In regards to her low urine output, Andrea Glenn reports only urinating once per day for the last 2 weeks. She states that her urine appears brown.   Patient adds that she recently fell on her left side after missing a step and hitting concrete, denies hitting head and did not fall on her hip. She reports no history of bone fractures.   Patient states she was diagnosed with MDD for which she takes Prozac and Zoloft. Patient notes that her father passed 1 week prior to admission and was thinking her emesis was due to emotional distress of her father's declining health and eventual demise. Andrea Glenn also adds that she has neuropathy in her feet that keeps  her inside the home to avoid pain with walking. Patient is denying suicidal ideation and depressed mood today.    Review Of Systems: Per HPI with the following additions  Review of Systems  Constitutional: Positive for malaise/fatigue and weight loss. Negative for chills and fever.  HENT: Negative for congestion, ear pain and sore throat.        Rhinorrhea   Eyes: Negative for blurred vision, discharge and redness.  Respiratory: Negative for cough, shortness of breath and wheezing.   Gastrointestinal: Positive for nausea and vomiting. Negative for abdominal pain, blood in stool, constipation, diarrhea and melena.  Genitourinary: Negative for dysuria.       Dark brown urine  Musculoskeletal:       Left side with rib pain after fall on 7/25   Skin: Negative for itching and rash.  Neurological: Positive for weakness. Negative for  dizziness, focal weakness and headaches.  Endo/Heme/Allergies: Positive for environmental allergies.  Psychiatric/Behavioral: Negative for depression.    Patient Active Problem List   Diagnosis Date Noted  . Nausea & vomiting 11/22/2018  . Fall 11/22/2018  . Vomiting 11/22/2018  . DM (diabetes mellitus), type 2 with renal complications ([place]) 62/69/4854  . Essential hypertension 06/16/2018  . Nephrotic syndrome   . Symptomatic anemia 04/05/2018  . Leg swelling   . Diabetes due to underlying condition w diabetic nephropathy ([place])   . Solitary pulmonary nodule 10/10/2017  . Depression 09/26/2017  . Urinary tract infection without hematuria   . Postherpetic neuralgia 09/08/2017  . Tremor   . Frequent falls 07/22/2017  . Dehydration 07/22/2017  . CKD (chronic kidney disease) 07/08/2017  . Anemia of chronic disease 06/21/2017  . Primary hypertension 02/03/2017  . Hematuria 01/20/2017  . Dysuria 01/20/2017  . Lower extremity edema 12/30/2016  . Pancreatitis 10/08/2016  . Hyperlipidemia associated with type 2 diabetes mellitus ([place]) 10/26/2013  . Restless leg syndrome 03/03/2010  . Uncontrolled type 2 diabetes mellitus with peripheral neuropathy ([place]) 06/16/2006  . Tobacco use disorder 06/16/2006    Past Medical History: Past Medical History:  Diagnosis Date  . Diabetes mellitus without complication ([place])   . Edema of both legs   . Grief 09/08/2017  . Hypertension   . Neuropathy     Past Surgical History: Past Surgical History:  Procedure Laterality Date  . BACK SURGERY    . CESAREAN SECTION    . FOOT SURGERY    . SHOULDER SURGERY      Social History: Social History   Tobacco Use  . Smoking status: Current Every Day Smoker    Packs/day: 0.10    Years: 20.00    Pack years: 2.00    Types: Cigarettes    Start date: 65  . Smokeless tobacco: Never Used  . Tobacco comment: working on quitting - 2 per day - 2.26.19  Substance Use Topics  . Alcohol use: No  .  Drug use: No   1 pack lasts 3-4 days     Family History: Family History  Problem Relation Age of Onset  . Kidney failure Mother   . Diabetes Mother   . Thyroid disease Mother   . Hypertension Mother   . Heart failure Mother   . Kidney failure Father   . Cancer Sister   . Stroke Brother   . Cancer Other     Allergies and Medications: Allergies  Allergen Reactions  . Atorvastatin Other (See Comments)    Suicidal thoughts  . Statins Other (See Comments)  This class of meds causes "suicidal thoughts"  . Amitriptyline Other (See Comments)    Depression  . Metformin And Related Diarrhea  . Adhesive [Tape] Other (See Comments)    "Sometimes peels off my skin"  . Gabapentin Other (See Comments)    "Gives me the shakes"   No current facility-administered medications on file prior to encounter.    Current Outpatient Medications on File Prior to Encounter  Medication Sig Dispense Refill  . acetaminophen (TYLENOL) 500 MG tablet Take 1,000 mg by mouth every 6 (six) hours as needed (for pain or headaches).     Marland Kitchen amLODipine (NORVASC) 2.5 MG tablet Take 2.5 mg by mouth daily.    Marland Kitchen ammonium lactate (AMLACTIN) 12 % lotion Apply 1 application topically daily. Apply to both feet daily 400 g 0  . bisacodyl (DULCOLAX) 5 MG EC tablet Take 1 tablet (5 mg total) by mouth daily as needed for moderate constipation. 30 tablet 0  . capsaicin (ZOSTRIX) 0.025 % cream Apply topically 2 (two) times daily as needed (pain). 60 g 0  . clotrimazole (LOTRIMIN) 1 % cream Apply 1 application topically 2 (two) times daily. 60 g 0  . diphenhydrAMINE (BENADRYL ALLERGY) 25 mg capsule Take 1 capsule (25 mg total) by mouth every 6 (six) hours as needed. 30 capsule 0  . Dulaglutide 1.5 MG/0.5ML SOPN Inject 1.5 mg into the skin once a week. 4 pen 11  . DULoxetine (CYMBALTA) 30 MG capsule Take 1 capsule (30 mg total) by mouth daily. 30 capsule 2  . Elastic Bandages & Supports (MEDICAL COMPRESSION STOCKINGS) MISC 1  Package by Does not apply route daily. 1 each 0  . FLUoxetine (PROZAC) 20 MG capsule Take 1 capsule (20 mg total) by mouth daily. 90 capsule 3  . furosemide (LASIX) 40 MG tablet Take 1 tablet (40 mg total) by mouth 2 (two) times daily. 30 tablet 0  . hydrALAZINE (APRESOLINE) 25 MG tablet Take 1 tablet (25 mg total) by mouth 3 (three) times daily for 30 days. 90 tablet 0  . hydrOXYzine (ATARAX/VISTARIL) 25 MG tablet Take 1 tablet (25 mg total) by mouth 3 (three) times daily as needed. (Patient taking differently: Take 25 mg by mouth 3 (three) times daily as needed for itching. ) 60 tablet 0  . levocetirizine (XYZAL) 5 MG tablet TAKE 1 TABLET BY MOUTH EVERY EVENING *NOT USE WITH BENADRYL** 30 tablet 0  . polyvinyl alcohol (LIQUIFILM TEARS) 1.4 % ophthalmic solution Place 2 drops into both eyes 4 (four) times daily. 15 mL 0  . sertraline (ZOLOFT) 50 MG tablet Take 1 tablet (50 mg total) by mouth daily. 30 tablet 0  . tacrolimus (PROGRAF) 1 MG capsule Take 1 mg by mouth 2 (two) times daily.    Marland Kitchen triamcinolone cream (KENALOG) 0.1 % Apply 1 application topically 2 (two) times daily. 453.6 g 0    Objective: BP 137/76 (BP Location: Right Arm)   Pulse 85   Temp 98.6 F (37 C) (Oral)   Resp 18   SpO2 100%  Exam:  General: chronically ill appearing, thin female sitting up in bed in NAD, eating dinner  Eyes: no scleral icterus, patient wearing brown contacts, no conjunctival injection noted, conjunctival pallor  ENTM: patent nares, no oropharyngeal erythema or petechiae Neck: supple with normal ROM, no cervical LAD  Cardiovascular: RRR with no overt murmurs, gallops or rubs, cap refill slightly >3 secs Respiratory: diminished breath sounds bilaterally, no crackles or wheezing appreciated, patient has normal WOB, no  accessory use of muscles  Gastrointestinal: soft, NT, +BS throughout MSK: moves all extremities with normal ROM  Derm: dry skin on Bilateral lower extremities  Neuro: alert and oriented  x3  Psych: patient stated mood as "good", affect congruent with mood, appropriate insight and thought content, speech mildly tangential but easily redirected   Labs and Imaging: CBC BMET  Recent Labs  Lab 11/22/18 1215  HGB 8.1*   No results for input(s): NA, K, CL, CO2, BUN, CREATININE, GLUCOSE, CALCIUM in the last 168 hours.   CMP     Component Value Date/Time   NA 143 11/22/2018 1509   NA 140 04/13/2018 0851   K 4.5 11/22/2018 1509   CL 111 11/22/2018 1509   CO2 24 11/22/2018 1509   GLUCOSE 196 (H) 11/22/2018 1509   BUN 28 (H) 11/22/2018 1509   BUN 47 (H) 04/13/2018 0851   CREATININE 3.18 (H) 11/22/2018 1509   CALCIUM 8.6 (L) 11/22/2018 1509   PROT 5.8 (L) 11/22/2018 1509   PROT 5.8 (L) 04/04/2018 1613   ALBUMIN 2.0 (L) 11/22/2018 1509   ALBUMIN 2.8 (L) 04/04/2018 1613   AST 22 11/22/2018 1509   ALT 10 11/22/2018 1509   ALKPHOS 69 11/22/2018 1509   BILITOT 0.6 11/22/2018 1509   BILITOT 0.2 04/04/2018 1613   GFRNONAA 15 (L) 11/22/2018 1509   GFRAA 18 (L) 11/22/2018 1509   CT Abd and Pelvis: No acute abnormality.   Stark Klein, MD 11/22/2018, 2:53 PM PGY-1, [place] Intern pager: [phone], text pages welcome   FPTS Upper-Level Resident Addendum  I have independently interviewed and examined the patient. I have discussed the above with the original author and agree with their documentation. My edits for correction/addition/clarification are in blue . Please see also any attending notes.    Wilber Oliphant, M.D.  PGY-2  Family Medicine Teaching Service 11/22/2018 9:30 PM  [place] Service pager: [phone] (text pages welcome through [place])

## 2018-11-23 ENCOUNTER — Observation Stay (HOSPITAL_COMMUNITY): Payer: Medicaid Other

## 2018-11-23 DIAGNOSIS — Z9119 Patient's noncompliance with other medical treatment and regimen: Secondary | ICD-10-CM | POA: Diagnosis not present

## 2018-11-23 DIAGNOSIS — N179 Acute kidney failure, unspecified: Secondary | ICD-10-CM | POA: Diagnosis present

## 2018-11-23 DIAGNOSIS — G2581 Restless legs syndrome: Secondary | ICD-10-CM | POA: Diagnosis present

## 2018-11-23 DIAGNOSIS — Z841 Family history of disorders of kidney and ureter: Secondary | ICD-10-CM | POA: Diagnosis not present

## 2018-11-23 DIAGNOSIS — Z8249 Family history of ischemic heart disease and other diseases of the circulatory system: Secondary | ICD-10-CM | POA: Diagnosis not present

## 2018-11-23 DIAGNOSIS — I129 Hypertensive chronic kidney disease with stage 1 through stage 4 chronic kidney disease, or unspecified chronic kidney disease: Secondary | ICD-10-CM | POA: Diagnosis present

## 2018-11-23 DIAGNOSIS — R0781 Pleurodynia: Secondary | ICD-10-CM | POA: Diagnosis present

## 2018-11-23 DIAGNOSIS — Z888 Allergy status to other drugs, medicaments and biological substances status: Secondary | ICD-10-CM | POA: Diagnosis not present

## 2018-11-23 DIAGNOSIS — R600 Localized edema: Secondary | ICD-10-CM | POA: Diagnosis present

## 2018-11-23 DIAGNOSIS — E1122 Type 2 diabetes mellitus with diabetic chronic kidney disease: Secondary | ICD-10-CM | POA: Diagnosis present

## 2018-11-23 DIAGNOSIS — N184 Chronic kidney disease, stage 4 (severe): Secondary | ICD-10-CM

## 2018-11-23 DIAGNOSIS — Z9109 Other allergy status, other than to drugs and biological substances: Secondary | ICD-10-CM | POA: Diagnosis not present

## 2018-11-23 DIAGNOSIS — F329 Major depressive disorder, single episode, unspecified: Secondary | ICD-10-CM | POA: Diagnosis present

## 2018-11-23 DIAGNOSIS — Z20828 Contact with and (suspected) exposure to other viral communicable diseases: Secondary | ICD-10-CM | POA: Diagnosis present

## 2018-11-23 DIAGNOSIS — D631 Anemia in chronic kidney disease: Secondary | ICD-10-CM | POA: Diagnosis present

## 2018-11-23 DIAGNOSIS — W19XXXA Unspecified fall, initial encounter: Secondary | ICD-10-CM | POA: Diagnosis present

## 2018-11-23 DIAGNOSIS — E8770 Fluid overload, unspecified: Secondary | ICD-10-CM | POA: Diagnosis not present

## 2018-11-23 DIAGNOSIS — E1169 Type 2 diabetes mellitus with other specified complication: Secondary | ICD-10-CM | POA: Diagnosis present

## 2018-11-23 DIAGNOSIS — E1165 Type 2 diabetes mellitus with hyperglycemia: Secondary | ICD-10-CM | POA: Diagnosis present

## 2018-11-23 DIAGNOSIS — E1142 Type 2 diabetes mellitus with diabetic polyneuropathy: Secondary | ICD-10-CM | POA: Diagnosis present

## 2018-11-23 DIAGNOSIS — Z79899 Other long term (current) drug therapy: Secondary | ICD-10-CM | POA: Diagnosis not present

## 2018-11-23 DIAGNOSIS — R1110 Vomiting, unspecified: Secondary | ICD-10-CM | POA: Diagnosis present

## 2018-11-23 DIAGNOSIS — E785 Hyperlipidemia, unspecified: Secondary | ICD-10-CM | POA: Diagnosis present

## 2018-11-23 DIAGNOSIS — Z833 Family history of diabetes mellitus: Secondary | ICD-10-CM | POA: Diagnosis not present

## 2018-11-23 DIAGNOSIS — D638 Anemia in other chronic diseases classified elsewhere: Secondary | ICD-10-CM | POA: Diagnosis not present

## 2018-11-23 DIAGNOSIS — F17210 Nicotine dependence, cigarettes, uncomplicated: Secondary | ICD-10-CM | POA: Diagnosis present

## 2018-11-23 LAB — CBC WITH DIFFERENTIAL/PLATELET
Abs Immature Granulocytes: 0.09 10*3/uL — ABNORMAL HIGH (ref 0.00–0.07)
Basophils Absolute: 0.1 10*3/uL (ref 0.0–0.1)
Basophils Relative: 1 %
Eosinophils Absolute: 0.2 10*3/uL (ref 0.0–0.5)
Eosinophils Relative: 2 %
HCT: 25.6 % — ABNORMAL LOW (ref 36.0–46.0)
Hemoglobin: 8 g/dL — ABNORMAL LOW (ref 12.0–15.0)
Immature Granulocytes: 1 %
Lymphocytes Relative: 46 %
Lymphs Abs: 5.3 10*3/uL — ABNORMAL HIGH (ref 0.7–4.0)
MCH: 28.7 pg (ref 26.0–34.0)
MCHC: 31.3 g/dL (ref 30.0–36.0)
MCV: 91.8 fL (ref 80.0–100.0)
Monocytes Absolute: 0.6 10*3/uL (ref 0.1–1.0)
Monocytes Relative: 6 %
Neutro Abs: 5 10*3/uL (ref 1.7–7.7)
Neutrophils Relative %: 44 %
Platelets: 227 10*3/uL (ref 150–400)
RBC: 2.79 MIL/uL — ABNORMAL LOW (ref 3.87–5.11)
RDW: 13.8 % (ref 11.5–15.5)
WBC: 11.3 10*3/uL — ABNORMAL HIGH (ref 4.0–10.5)
nRBC: 0 % (ref 0.0–0.2)

## 2018-11-23 LAB — PROTEIN / CREATININE RATIO, URINE
Creatinine, Urine: 98.91 mg/dL
Protein Creatinine Ratio: 12.14 mg/mg{Cre} — ABNORMAL HIGH (ref 0.00–0.15)
Total Protein, Urine: 1201 mg/dL

## 2018-11-23 LAB — RENAL FUNCTION PANEL
Albumin: 1.7 g/dL — ABNORMAL LOW (ref 3.5–5.0)
Anion gap: 9 (ref 5–15)
BUN: 33 mg/dL — ABNORMAL HIGH (ref 6–20)
CO2: 23 mmol/L (ref 22–32)
Calcium: 7.9 mg/dL — ABNORMAL LOW (ref 8.9–10.3)
Chloride: 106 mmol/L (ref 98–111)
Creatinine, Ser: 3.27 mg/dL — ABNORMAL HIGH (ref 0.44–1.00)
GFR calc Af Amer: 17 mL/min — ABNORMAL LOW (ref >60)
GFR calc non Af Amer: 15 mL/min — ABNORMAL LOW (ref >60)
Glucose, Bld: 122 mg/dL — ABNORMAL HIGH (ref 70–99)
Phosphorus: 4.9 mg/dL — ABNORMAL HIGH (ref 2.5–4.6)
Potassium: 4 mmol/L (ref 3.5–5.1)
Sodium: 138 mmol/L (ref 135–145)

## 2018-11-23 LAB — URINALYSIS, ROUTINE W REFLEX MICROSCOPIC
Bilirubin Urine: NEGATIVE
Glucose, UA: >=500 mg/dL — AB
Ketones, ur: NEGATIVE mg/dL
Leukocytes,Ua: NEGATIVE
Nitrite: NEGATIVE
Protein, ur: >=300 mg/dL — AB
Specific Gravity, Urine: 1.018 (ref 1.005–1.030)
pH: 6 (ref 5.0–8.0)

## 2018-11-23 LAB — SARS CORONAVIRUS 2 BY RT PCR (HOSPITAL ORDER, PERFORMED IN ~~LOC~~ HOSPITAL LAB): SARS Coronavirus 2: NEGATIVE

## 2018-11-23 MED ORDER — ACETAMINOPHEN 325 MG PO TABS
650.0000 mg | ORAL_TABLET | Freq: Four times a day (QID) | ORAL | Status: DC | PRN
  Administered 2018-11-24: 650 mg via ORAL
  Filled 2018-11-23 (×2): qty 2

## 2018-11-23 MED ORDER — AMLODIPINE BESYLATE 10 MG PO TABS
10.0000 mg | ORAL_TABLET | Freq: Every day | ORAL | Status: DC
  Administered 2018-11-24: 10 mg via ORAL
  Filled 2018-11-23: qty 1

## 2018-11-23 MED ORDER — FUROSEMIDE 80 MG PO TABS
80.0000 mg | ORAL_TABLET | Freq: Two times a day (BID) | ORAL | Status: DC
  Administered 2018-11-23 – 2018-11-24 (×2): 80 mg via ORAL
  Filled 2018-11-23 (×2): qty 1

## 2018-11-23 MED ORDER — AMLODIPINE BESYLATE 5 MG PO TABS
5.0000 mg | ORAL_TABLET | Freq: Once | ORAL | Status: AC
  Administered 2018-11-23: 5 mg via ORAL
  Filled 2018-11-23: qty 1

## 2018-11-23 MED ORDER — TACROLIMUS 1 MG PO CAPS
1.0000 mg | ORAL_CAPSULE | Freq: Every day | ORAL | Status: DC
  Administered 2018-11-23: 1 mg via ORAL
  Filled 2018-11-23: qty 1

## 2018-11-23 MED ORDER — TACROLIMUS 1 MG PO CAPS
2.0000 mg | ORAL_CAPSULE | Freq: Every day | ORAL | Status: DC
  Administered 2018-11-24: 2 mg via ORAL
  Filled 2018-11-23: qty 2

## 2018-11-23 MED ORDER — HYDROXYZINE HCL 25 MG PO TABS
25.0000 mg | ORAL_TABLET | Freq: Once | ORAL | Status: AC
  Administered 2018-11-23: 25 mg via ORAL
  Filled 2018-11-23: qty 1

## 2018-11-23 MED ORDER — LIDOCAINE 5 % EX PTCH
1.0000 | MEDICATED_PATCH | CUTANEOUS | Status: AC
  Administered 2018-11-23: 1 via TRANSDERMAL
  Filled 2018-11-23: qty 1

## 2018-11-23 MED ORDER — SODIUM CHLORIDE 0.9 % IV BOLUS
500.0000 mL | Freq: Once | INTRAVENOUS | Status: AC
  Administered 2018-11-23: 12:00:00 500 mL via INTRAVENOUS

## 2018-11-23 MED ORDER — DARBEPOETIN ALFA 100 MCG/0.5ML IJ SOSY
100.0000 ug | PREFILLED_SYRINGE | INTRAMUSCULAR | Status: DC
  Administered 2018-11-23: 100 ug via SUBCUTANEOUS
  Filled 2018-11-23: qty 0.5

## 2018-11-23 MED ORDER — AMLODIPINE BESYLATE 5 MG PO TABS
5.0000 mg | ORAL_TABLET | Freq: Every day | ORAL | Status: DC
  Administered 2018-11-23: 06:00:00 5 mg via ORAL
  Filled 2018-11-23: qty 1

## 2018-11-23 NOTE — Progress Notes (Signed)
Family Medicine Teaching Service Daily Progress Note Intern Pager: [phone]  Patient name: Andrea Glenn Medical record number: [id number] Date of birth: 1960/12/09 Age: 58 y.o. Gender: female  Primary Care Provider: Guadalupe Dawn, MD Consultants: Nephrology, PT/OT Code Status: Full code   Pt Overview and Major Events to Date:  11/22/18 - Patient admitted for prolonged vomiting, decreased UOP and weight loss  Assessment and Plan:  Emesis (resolved) Overnight, patient tolerated dinner and fluids with no further episodes of emesis following administration of Zofran in clinic prior to admission. Patient remained afebrile overnight. EKG showed normal QT interval.Since patient is experiencing no further emesis, will not consult GI nor pursue further workup of eshophageal dysfunction.    - Continuous Cardiac monitoring & pulse ox - Vitals per unit protocol - Diet renal/carbmodified, salt restriction, 1200 mL fluid restriction  - give 500 mL IV NS  - Zofran PRN  - can trial metoclopramide + benadryl - Out of bed with assistance, fall risk   Decreased Urine output  -AKI -FSGS  -CKD IV Patient reports urinating dark fluid overnight and this morning. Cr is 3.18 on admission, now 3.27.  Patient has history of CKD Stage four and reports urinating once per day for last few weeks. BUN is elevated overnight to 33 from 28 overnight. Decreased UOP can be from worsening kidney function, or a result of decreased PO and vomiting.  -f/u nephrology recommendations  - renal function panel, protein/cr ratio -daily weights  -bladder scan every shift if no urine output  -Strict IO  -Tacrolimus level to be collected, per [facility], goal 5-10 -strict I/Os  -increased torsemide to 80mg  daily    Recent Weight loss  Patient reports weight was previously weighing 121 in April weighted and at recent PCP visit  weighed 112. Patient reports attempting to increase her caloric intake and denies  that this weight loss is intentional.  -f/u recs for nutrition  -PT /OT  -diet supplement per Dr. Andria Frames   Left Rib Pain 2/2 Fall Patient continues to have left rib cage pain that is improved with lidocaine patch.  - incentive spirometry  - tylenol PRN pain - PT/OT  -Patient with lidocaine patch    HTN Amlodipine 2.5mg  and Hydralazine as home medications. BP hypertensive as 99991111 systolic  -increased home Amlodipine to 10 mg daily     DM Patient on home Dulaglutide 1.5mg  weekly. Currently well controlled. Will hold off on sliding insulin here.  - monitor CBG with meals and a night    MDD: Patient on Zoloft 50 mg daily and Prozac 20mg  -will continue home Zoloft 50mg  and Prozac     Tobacco Use (currently smokes 1 pack in 3-4 days for 20 years)  -smoking cessation education  -Nicotine patch 14mg     FEN/GI: renal diet  -Miralax    Prophylaxis: Hep 5,000 units    Disposition: d/c home pending nephrology evaluation and tolerating PO intake without emesis  Subjective:  Andrea Glenn states that she is doing well this morning and has been able to tolerate her dinner and breakfast without further episodes of emesis. She also reports urinating overnight and this morning that was dark in appearance. She continues to report pain on her left side of chest.   Objective: Temp:  [97.7 F (36.5 C)-98.9 F (37.2 C)] 97.7 F (36.5 C) (08/06 0833) Pulse Rate:  [72-85] 72 (08/06 0833) Resp:  [16-18] 16 (08/06 0833) BP: (137-190)/(66-82) 152/66 (08/06 1147) SpO2:  [100 %] 100 % (08/06  0833) Weight:  [53 kg] 53 kg (08/06 0500)    Intake/Output Summary (Last 24 hours) at 11/23/2018 1412 Last data filed at 11/23/2018 0928 Gross per 24 hour  Intake 390 ml  Output 300 ml  Net 90 ml   Physical Exam: General: Alert and cooperative and appears to be in no acute distress, thin appearing female sitting up in bed  HEENT: Neck non-tender without lymphadenopathy, masses or thyromegaly Cardio:  Normal S1 and S2, no S3 or S4. Rhythm is regular. No murmurs or rubs.   Pulm: Normal, comfortable work of breathing, no crackles, wheezing, patietn with diminished breath sounds that are not coarse. Normal respiratory effort Abdomen: Bowel sounds normal. Abdomen soft and non-tender.  Extremities: minimal LE edema. Warm/ well perfused.   Neuro: patient is alert and oriented x3  Laboratory: Recent Labs  Lab 11/22/18 1215 11/22/18 1509 11/23/18 0253  WBC  --  10.0 11.3*  HGB 8.1* 9.2* 8.0*  HCT  --  30.3* 25.6*  PLT  --  265 227   Recent Labs  Lab 11/22/18 1509 11/23/18 0253  NA 143 138  K 4.5 4.0  CL 111 106  CO2 24 23  BUN 28* 33*  CREATININE 3.18* 3.27*  CALCIUM 8.6* 7.9*  PROT 5.8*  --   BILITOT 0.6  --   ALKPHOS 69  --   ALT 10  --   AST 22  --   GLUCOSE 196* 122*   Imaging/Diagnostic Tests: RUQ u/s - normal without cholelithiasis   Stark Klein, MD 11/23/2018, 2:12 PM PGY-1, [place] Intern pager: [phone], text pages welcome

## 2018-11-23 NOTE — Progress Notes (Signed)
Initial Nutrition Assessment  DOCUMENTATION CODES:   Not applicable  INTERVENTION:   -Continue Nepro Shake po BID, each supplement provides 425 kcal and 19 grams protein   NUTRITION DIAGNOSIS:   Inadequate oral intake related to vomiting as evidenced by percent weight loss, per patient/family report.  GOAL:   Patient will meet greater than or equal to 90% of their needs  MONITOR:   PO intake, Supplement acceptance, Labs, Weight trends, Skin, I & O's  REASON FOR ASSESSMENT:   Malnutrition Screening Tool    ASSESSMENT:   Andrea Glenn is a 58 y.o. 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  Pt admitted with emesis and questionable gastroparesis and decreased UOP.   Reviewed I/O's: -60 ml x 24 hours   UOP: 300 ml x 24 hours  Per nephrology notes, recent renal biopsy indicated possible primary FSGS as well as diabetic changes.   Spoke with pt at bedside, who reports feeling better today. She reports her appetite has improved and consumed 100% of breakfast (toast and rice krispies) earlier this morning. Pt is waiting on a [place] sandwich for lunch. Pt reports that she has had decreased oral intake secondary to emesis over the past month. Per pt, she would continue to eat 3 meals per day, however, "it all came right back up". Pt reports that she tries to eat a healthful diet that consists of mainly green vegetables and lean proteins. Lately, she has been preparing a lot of stir fry.   Pt expresses concern over weight loss. She reports her UBW is around 125-130#; "my weight is now 119# and that's a little scary". Reviewed wt hx; noted pt has experienced a 13.5% wt loss over the past 6 months, which is significant for time frame.   Pt reports she is very hungry and frustrated with options on renal diet. Discussed with pt currently rationale for renal diet. Pt amenable to  try Nepro supplement to assist with increased nutrient provision.  Labs reviewed: Phos: 4.9.   NUTRITION - FOCUSED PHYSICAL EXAM:    Most Recent Value  Orbital Region  No depletion  Upper Arm Region  Mild depletion  Thoracic and Lumbar Region  No depletion  Buccal Region  No depletion  Temple Region  No depletion  Clavicle Bone Region  No depletion  Clavicle and Acromion Bone Region  No depletion  Scapular Bone Region  No depletion  Dorsal Hand  No depletion  Patellar Region  No depletion  Anterior Thigh Region  No depletion  Posterior Calf Region  No depletion  Edema (RD Assessment)  Mild  Hair  Reviewed  Eyes  Reviewed  Mouth  Reviewed  Skin  Reviewed  Nails  Reviewed       Diet Order:   Diet Order            Diet renal/carb modified with fluid restriction Diet-HS Snack? Nothing; Fluid restriction: 1200 mL Fluid; Room service appropriate? Yes; Fluid consistency: Thin  Diet effective now              EDUCATION NEEDS:   Education needs have been addressed  Skin:  Skin Assessment: Reviewed RN Assessment  Last BM:  11/22/18  Height:   Ht Readings from Last 1 Encounters:  11/23/18 5\' 5"  (1.651 m)    Weight:   Wt Readings from Last 1 Encounters:  11/23/18 53 kg  Ideal Body Weight:  56.8 kg  BMI:  Body mass index is 19.44 kg/m.  Estimated Nutritional Needs:   Kcal:  1600-1800  Protein:  75-90 grams  Fluid:  1.2 L    Andrea Glenn, RD, LDN, [place] Registered Dietitian II Certified Diabetes Care and Education Specialist Pager: [phone] After hours Pager: [phone]

## 2018-11-23 NOTE — Plan of Care (Signed)
  Problem: Clinical Measurements: Goal: Cardiovascular complication will be avoided Outcome: Progressing   Problem: Clinical Measurements: Goal: Ability to maintain clinical measurements within normal limits will improve Outcome: Progressing   Problem: Clinical Measurements: Goal: Cardiovascular complication will be avoided Outcome: Progressing   Problem: Pain Managment: Goal: General experience of comfort will improve Outcome: Progressing

## 2018-11-23 NOTE — Consult Note (Signed)
[facility] Renal Consultation Note  Requesting MD: Hensel Indication for Consultation: FSG-  Worsening renal function   HPI:  Andrea Glenn is a 58 y.o. female with past medical history significant for diabetes mellitus, poorly controlled, hypertension, hyperlipidemia and also discovery in December 2019 of worsening renal insufficiency and nephrotic syndrome.  Kidney biopsy consistent with FSG but also diabetic changes.  Patient is followed by myself at [facility].  It has been a little bit of a struggle to treat her.  I did not want to use prednisone secondary to her brittle diabetes so started her on Prograf which she had a delay in getting and has taken several drug holidays.  With this what we have seen as continued progression of renal disease.  Creatinine noted to be 2.0 in February 2020, I saw her in June and creatinine was 2.5.  Also, incidentally that was the first time that her Prograf level was therapeutic.  I told her to continue taking the Prograf as is with the hopes that the FSG would respond.  Patient has been dealing with the critical illness and death of her father and that is all she wants to talk about today.  She admits that she did not take her medicines appropriately during that time.  She got admitted yesterday from clinic due to a complaint of nausea and vomiting.  She is very difficult to get a history out of.  She states that she now wants to eat.  Creatinine has continued to worsen and is now at a level of 3.27.  Blood pressure is high so I do not think she is dry.  Primary service called me about getting a tacrolimus level but I do not see that she has received her tacrolimus here ?  She is her usual self, tangential in her conversation.  She is volume overloaded-I assume still nephrotic  Creatinine, Ser  Date/Time Value Ref Range Status  11/23/2018 02:53 AM 3.27 (H) 0.44 - 1.00 mg/dL Final  11/22/2018 03:09 PM 3.18 (H) 0.44 - 1.00 mg/dL  Final  06/17/2018 05:45 AM 2.02 (H) 0.44 - 1.00 mg/dL Final  06/16/2018 10:19 AM 2.06 (H) 0.44 - 1.00 mg/dL Final  04/13/2018 08:51 AM 1.78 (H) 0.57 - 1.00 mg/dL Final  04/10/2018 04:42 AM 2.06 (H) 0.44 - 1.00 mg/dL Final  04/09/2018 04:18 AM 1.98 (H) 0.44 - 1.00 mg/dL Final  04/08/2018 05:30 AM 1.97 (H) 0.44 - 1.00 mg/dL Final  04/07/2018 07:10 AM 1.83 (H) 0.44 - 1.00 mg/dL Final  04/06/2018 06:26 AM 1.76 (H) 0.44 - 1.00 mg/dL Final  04/05/2018 05:02 PM 1.60 (H) 0.44 - 1.00 mg/dL Final  04/04/2018 04:13 PM 1.78 (H) 0.57 - 1.00 mg/dL Final  03/21/2018 09:51 AM 1.52 (H) 0.57 - 1.00 mg/dL Final  09/12/2017 03:37 AM 1.17 (H) 0.44 - 1.00 mg/dL Final  09/11/2017 03:54 AM 1.15 (H) 0.44 - 1.00 mg/dL Final  09/10/2017 04:23 AM 1.06 (H) 0.44 - 1.00 mg/dL Final  09/09/2017 01:43 PM 1.54 (H) 0.44 - 1.00 mg/dL Final  09/07/2017 05:11 PM 1.77 (H) 0.57 - 1.00 mg/dL Final  08/17/2017 11:11 AM 1.28 (H) 0.57 - 1.00 mg/dL Final  07/29/2017 09:23 AM 1.56 (H) 0.57 - 1.00 mg/dL Final  07/25/2017 09:04 AM 1.24 (H) 0.44 - 1.00 mg/dL Final  07/24/2017 05:19 AM 1.17 (H) 0.44 - 1.00 mg/dL Final  07/23/2017 05:23 AM 1.72 (H) 0.44 - 1.00 mg/dL Final  07/22/2017 05:45 PM 1.62 (H) 0.44 - 1.00 mg/dL Final  07/18/2017  05:34 PM 1.60 (H) 0.44 - 1.00 mg/dL Final  07/12/2017 03:49 PM 1.46 (H) 0.44 - 1.00 mg/dL Final  07/08/2017 12:38 PM 1.31 (H) 0.57 - 1.00 mg/dL Final  06/23/2017 04:24 PM 1.50 (H) 0.57 - 1.00 mg/dL Final  06/14/2017 05:02 PM 1.19 (H) 0.57 - 1.00 mg/dL Final  06/09/2017 04:18 PM 1.11 (H) 0.57 - 1.00 mg/dL Final  10/09/2016 03:47 AM 0.67 0.44 - 1.00 mg/dL Final  10/08/2016 03:18 AM 0.85 0.44 - 1.00 mg/dL Final  10/07/2016 07:57 PM 0.90 0.44 - 1.00 mg/dL Final  09/29/2016 08:27 PM 1.05 (H) 0.44 - 1.00 mg/dL Final  06/13/2015 01:13 AM 1.12 (H) 0.44 - 1.00 mg/dL Final  07/27/2013 10:49 PM 0.95 0.50 - 1.10 mg/dL Final  03/02/2010 08:46 PM 0.73 0.40 - 1.20 mg/dL Final  10/21/2009 10:05 PM 0.77 0.4 - 1.2  mg/dL Final  10/21/2009 08:57 PM 0.8 0.4 - 1.2 mg/dL Final     PMHx:   Past Medical History:  Diagnosis Date  . CKD (chronic kidney disease)   . Diabetes mellitus without complication ([place])   . Dysuria 01/20/2017  . Edema of both legs   . Frequent falls 07/22/2017  . Grief 09/08/2017  . Hematuria 01/20/2017   Noted on UA's in June & Oct 2018, possibly related to UTI [ ]  recheck UA at future visit  . Hypertension   . Neuropathy   . Tremor   . Vomiting 11/22/2018    Past Surgical History:  Procedure Laterality Date  . BACK SURGERY    . CESAREAN SECTION    . FOOT SURGERY    . SHOULDER SURGERY      Family Hx:  Family History  Problem Relation Age of Onset  . Kidney failure Mother   . Diabetes Mother   . Thyroid disease Mother   . Hypertension Mother   . Heart failure Mother   . Kidney failure Father   . Cancer Sister   . Kidney failure Sister   . Stroke Brother   . Cancer Other   . Kidney failure Maternal Grandmother   . Kidney failure Maternal Grandfather   . Kidney failure Sister   . Heart attack Sister     Social History:  reports that she has been smoking cigarettes. She started smoking about 41 years ago. She has a 2.00 pack-year smoking history. She has never used smokeless tobacco. She reports that she does not drink alcohol or use drugs.  Allergies:  Allergies  Allergen Reactions  . Atorvastatin Other (See Comments)    Suicidal thoughts  . Statins Other (See Comments)    This class of meds causes "suicidal thoughts"  . Amitriptyline Other (See Comments)    Depression  . Metformin And Related Diarrhea  . Adhesive [Tape] Other (See Comments)    "Sometimes peels off my skin"  . Gabapentin Other (See Comments)    "Gives me the shakes"    Medications: Prior to Admission medications   Medication Sig Start Date End Date Taking? Authorizing Provider  acetaminophen (TYLENOL) 500 MG tablet Take 1,000 mg by mouth every 6 (six) hours as needed (for pain or  headaches).    Yes [provider]  amLODipine (NORVASC) 2.5 MG tablet Take 2.5 mg by mouth daily. 06/15/18  Yes [provider]  ammonium lactate (AMLACTIN) 12 % lotion Apply 1 application topically daily. Apply to both feet daily 06/09/18  Yes Galaway, Stephani Police, DPM  bisacodyl (DULCOLAX) 5 MG EC tablet Take 1 tablet (5 mg total)  by mouth daily as needed for moderate constipation. 07/25/17  Yes Patrecia Pour, Christean Grief, MD  capsaicin (ZOSTRIX) 0.025 % cream Apply topically 2 (two) times daily as needed (pain). 04/13/18  Yes Diallo, Abdoulaye, MD  Dulaglutide 1.5 MG/0.5ML SOPN Inject 1.5 mg into the skin once a week. 11/02/17  Yes Hensel, Jamal Collin, MD  FLUoxetine (PROZAC) 20 MG capsule Take 1 capsule (20 mg total) by mouth daily. 05/16/18  Yes Zachary Bing, DO  furosemide (LASIX) 40 MG tablet Take 1 tablet (40 mg total) by mouth 2 (two) times daily. 04/10/18  Yes Lovenia Kim, MD  hydrALAZINE (APRESOLINE) 25 MG tablet Take 1 tablet (25 mg total) by mouth 3 (three) times daily for 30 days. 06/17/18 11/23/18 Yes Shelly Coss, MD  hydrOXYzine (ATARAX/VISTARIL) 25 MG tablet Take 1 tablet (25 mg total) by mouth 3 (three) times daily as needed. Patient taking differently: Take 25 mg by mouth 3 (three) times daily as needed for itching.  01/16/18  Yes Bland, Scott, DO  levocetirizine (XYZAL) 5 MG tablet TAKE 1 TABLET BY MOUTH EVERY EVENING *NOT USE WITH BENADRYL** Patient taking differently: Take 5 mg by mouth every evening. DO NOT USE WITH BENADRYL 07/03/18  Yes  Bing, DO  polyvinyl alcohol (LIQUIFILM TEARS) 1.4 % ophthalmic solution Place 2 drops into both eyes 4 (four) times daily. 09/12/17  Yes Lovenia Kim, MD  sertraline (ZOLOFT) 50 MG tablet Take 1 tablet (50 mg total) by mouth daily. 03/21/18  Yes Anderson, Chelsey L, DO  tacrolimus (PROGRAF) 1 MG capsule Take 1-2 mg by mouth See admin instructions. 2mg  in the am and 1mg  at pm 05/10/18  Yes [provider]   triamcinolone cream (KENALOG) 0.1 % Apply 1 application topically 2 (two) times daily. 05/11/18  Yes Kinnie Feil, MD    I have reviewed the patient's current medications.  Labs:  Results for orders placed or performed during the hospital encounter of 11/22/18 (from the past 48 hour(s))  Comprehensive metabolic panel     Status: Abnormal   Collection Time: 11/22/18  3:09 PM  Result Value Ref Range   Sodium 143 135 - 145 mmol/L   Potassium 4.5 3.5 - 5.1 mmol/L   Chloride 111 98 - 111 mmol/L   CO2 24 22 - 32 mmol/L   Glucose, Bld 196 (H) 70 - 99 mg/dL   BUN 28 (H) 6 - 20 mg/dL   Creatinine, Ser 3.18 (H) 0.44 - 1.00 mg/dL   Calcium 8.6 (L) 8.9 - 10.3 mg/dL   Total Protein 5.8 (L) 6.5 - 8.1 g/dL   Albumin 2.0 (L) 3.5 - 5.0 g/dL   AST 22 15 - 41 U/L   ALT 10 0 - 44 U/L   Alkaline Phosphatase 69 38 - 126 U/L   Total Bilirubin 0.6 0.3 - 1.2 mg/dL   GFR calc non Af Amer 15 (L) >60 mL/min   GFR calc Af Amer 18 (L) >60 mL/min   Anion gap 8 5 - 15    Comment: Performed at [facility], [address], [place], [place] [address]  CBC with Differential/Platelet     Status: Abnormal   Collection Time: 11/22/18  3:09 PM  Result Value Ref Range   WBC 10.0 4.0 - 10.5 K/uL   RBC 3.26 (L) 3.87 - 5.11 MIL/uL   Hemoglobin 9.2 (L) 12.0 - 15.0 g/dL   HCT 30.3 (L) 36.0 - 46.0 %   MCV 92.9 80.0 - 100.0 fL   MCH  28.2 26.0 - 34.0 pg   MCHC 30.4 30.0 - 36.0 g/dL   RDW 13.9 11.5 - 15.5 %   Platelets 265 150 - 400 K/uL   nRBC 0.0 0.0 - 0.2 %   Neutrophils Relative % 60 %   Neutro Abs 5.9 1.7 - 7.7 K/uL   Lymphocytes Relative 34 %   Lymphs Abs 3.4 0.7 - 4.0 K/uL   Monocytes Relative 4 %   Monocytes Absolute 0.4 0.1 - 1.0 K/uL   Eosinophils Relative 1 %   Eosinophils Absolute 0.1 0.0 - 0.5 K/uL   Basophils Relative 1 %   Basophils Absolute 0.1 0.0 - 0.1 K/uL   Immature Granulocytes 0 %   Abs Immature Granulocytes 0.02 0.00 - 0.07 K/uL    Comment: Performed at [facility] [address] [place], [place] [address]  Lipase, blood     Status: None   Collection Time: 11/22/18  3:09 PM  Result Value Ref Range   Lipase 23 11 - 51 U/L    Comment: Performed at [facility], [place] [address] [place], [place] [address]  SARS Coronavirus 2 [facility] order, Performed in [facility] hospital lab)     Status: None   Collection Time: 11/22/18 10:58 PM  Result Value Ref Range   SARS Coronavirus 2 NEGATIVE NEGATIVE    Comment: (NOTE) SARS CoV 2 target nucleic acids are NOT DETECTED. The SARS CoV 2 RNA is generally detectable in upper and lower respiratory specimens during the acute phase of infection. The lowest concentration of SARS CoV 2 viral copies this assay can detect is 250 copies per mL. A negative result does not preclude SARS CoV 2 infection and should not be used as the sole basis for treatment or other patient management decisions. A negative result may occur with improper specimen collection and handling, submission  of specimen other than nasopharyngeal swab, presence of viral mutation(s) within the areas targeted by this assay, and inadequate number of viral copies (less than 250 copies per mL). A negative result must be combined with clinical observations, patient history,  and epidemiological information. The expected result is Negative. Fact Sheet for Patients:   https [employer] media X6558951 download Fact Sheet for Healthcare Providers:   https [employer] media (956)195-3052 d Jenel Lucks This test is not yet approved or cleared by the [place] FDA and  has been authorized for detection and/or diagnosis of SARS CoV 2 by FDA under an Emergency Use Authorization (EUA).  This EUA will remain in effect (meaning this test can be used) for the duration of  the COVID19 declaration under Section 564(b)(1) of the Act, 21 U.S.C.  section 360bbb-3(b)(1), unless the authorization is terminated or revoked sooner. Performed at [facility], [place]  [address], [place], [place] [address]   Urinalysis, Routine w reflex microscopic     Status: Abnormal   Collection Time: 11/22/18 11:46 PM  Result Value Ref Range   Color, Urine YELLOW YELLOW   APPearance HAZY (A) CLEAR   Specific Gravity, Urine 1.018 1.005 - 1.030   pH 6.0 5.0 - 8.0   Glucose, UA >=500 (A) NEGATIVE mg/dL   Hgb urine dipstick SMALL (A) NEGATIVE   Bilirubin Urine NEGATIVE NEGATIVE   Ketones, ur NEGATIVE NEGATIVE mg/dL   Protein, ur >=300 (A) NEGATIVE mg/dL   Nitrite NEGATIVE NEGATIVE   Leukocytes,Ua NEGATIVE NEGATIVE   RBC / HPF 6-10 0 - 5 RBC/hpf   WBC, UA 11-20 0 -  5 WBC/hpf   Bacteria, UA RARE (A) NONE SEEN   Squamous Epithelial / LPF 0-5 0 - 5   Mucus PRESENT    Hyaline Casts, UA PRESENT     Comment: Performed at [facility], [place] [address], [place], [place] [address]  Renal function panel     Status: Abnormal   Collection Time: 11/23/18  2:53 AM  Result Value Ref Range   Sodium 138 135 - 145 mmol/L   Potassium 4.0 3.5 - 5.1 mmol/L   Chloride 106 98 - 111 mmol/L   CO2 23 22 - 32 mmol/L   Glucose, Bld 122 (H) 70 - 99 mg/dL   BUN 33 (H) 6 - 20 mg/dL   Creatinine, Ser 3.27 (H) 0.44 - 1.00 mg/dL   Calcium 7.9 (L) 8.9 - 10.3 mg/dL   Phosphorus 4.9 (H) 2.5 - 4.6 mg/dL   Albumin 1.7 (L) 3.5 - 5.0 g/dL   GFR calc non Af Amer 15 (L) >60 mL/min   GFR calc Af Amer 17 (L) >60 mL/min   Anion gap 9 5 - 15    Comment: Performed at [facility] [address], [place], [place] [address]  CBC with Differential/Platelet     Status: Abnormal   Collection Time: 11/23/18  2:53 AM  Result Value Ref Range   WBC 11.3 (H) 4.0 - 10.5 K/uL   RBC 2.79 (L) 3.87 - 5.11 MIL/uL   Hemoglobin 8.0 (L) 12.0 - 15.0 g/dL   HCT 25.6 (L) 36.0 - 46.0 %   MCV 91.8 80.0 - 100.0 fL   MCH 28.7 26.0 - 34.0 pg   MCHC 31.3 30.0 - 36.0 g/dL   RDW 13.8 11.5 - 15.5 %   Platelets 227 150 - 400 K/uL   nRBC 0.0 0.0 - 0.2 %   Neutrophils Relative % 44 %   Neutro Abs 5.0 1.7 - 7.7 K/uL    Lymphocytes Relative 46 %   Lymphs Abs 5.3 (H) 0.7 - 4.0 K/uL   Monocytes Relative 6 %   Monocytes Absolute 0.6 0.1 - 1.0 K/uL   Eosinophils Relative 2 %   Eosinophils Absolute 0.2 0.0 - 0.5 K/uL   Basophils Relative 1 %   Basophils Absolute 0.1 0.0 - 0.1 K/uL   Immature Granulocytes 1 %   Abs Immature Granulocytes 0.09 (H) 0.00 - 0.07 K/uL    Comment: Performed at [facility] [address] [place], [place] [address]     ROS:  A comprehensive review of systems was negative except for: Constitutional: positive for Itching Cardiovascular: positive for lower extremity edema She no longer has nausea and vomiting    Physical Exam: Vitals:   11/23/18 0621 11/23/18 0833  BP: (!) 190/76 (!) 189/82  Pulse:  72  Resp:  16  Temp:  97.7 F (36.5 C)  SpO2:  100%     General: Talkative, difficult to keep on task, no acute distress HEENT: Pupils are equally round reactive to light, extraocular motions are intact, mucous membranes moist Neck: Difficult to determine JVD Heart: Regular rate and rhythm Lungs: Mostly clear Abdomen: Soft nontender Extremities: 1+ pitting edema Skin: Warm and dry Neuro: Alert, nonfocal  Assessment/Plan: 58 year old black female with poorly controlled diabetes, hypertension also with FSG by renal biopsy with progressive renal insufficiency and noncompliance 1.Renal-renal biopsy consistent with possible primary FSGS as well as diabetic changes.  She was prescribed Prograf 7 months ago but really has not been taking it consistently.  She did have a level of 6.5 when I saw her in June with a creatinine of 2.55.  She will tell you that she is doing absolutely everything that she can to not be on dialysis but she will not take her medicines.  I cannot say for sure if she is failed Prograf or just has not taken it consistently enough.  With albumin of less than 2 she is clearly still nephrotic.  Options for FSG are kind of limited and it can sometimes be  refractory to treatment.  This along with her brittle diabetes I do not think bodes well for her kidney function for the future.  I would not expect her nausea and vomiting to be due to uremia as her GFR is still close to 20.  She has not ordered a Prograf here?  Level to be checked tonight but it likely will be back for couple of days.  I would put her on her home dose 2. Hypertension/volume  -hypertensive and volume overloaded.  I will resume her Lasix at a higher dose of 80 twice daily to compensate for her worsening GFR 3. Anemia  -has been an issue for her in the past.  Probably has some anemia now due to renal insufficiency.  Will check iron stores and give her a dose of Aranesp   Louis Meckel 11/23/2018, 11:24 AM

## 2018-11-23 NOTE — Progress Notes (Signed)
Spoke to Dr. Moshe Cipro from [facility] over the phone.  Asked her about patient's tacrolimus in the setting of hospitalization and acute creatinine rise.  We will obtain trough at 8 PM this evening.  Goal trough is 5-10.  If medication needs to decrease, should decrease a.m. dose to 1mg  and keep p.m. dose to 1pm  Andrea Glenn, M.D.  PGY-2  Family Medicine  [phone] 11/23/2018 9:14 AM

## 2018-11-24 DIAGNOSIS — F17200 Nicotine dependence, unspecified, uncomplicated: Secondary | ICD-10-CM

## 2018-11-24 LAB — RENAL FUNCTION PANEL
Albumin: 1.7 g/dL — ABNORMAL LOW (ref 3.5–5.0)
Anion gap: 8 (ref 5–15)
BUN: 32 mg/dL — ABNORMAL HIGH (ref 6–20)
CO2: 21 mmol/L — ABNORMAL LOW (ref 22–32)
Calcium: 8.1 mg/dL — ABNORMAL LOW (ref 8.9–10.3)
Chloride: 110 mmol/L (ref 98–111)
Creatinine, Ser: 3.11 mg/dL — ABNORMAL HIGH (ref 0.44–1.00)
GFR calc Af Amer: 18 mL/min — ABNORMAL LOW (ref >60)
GFR calc non Af Amer: 16 mL/min — ABNORMAL LOW (ref >60)
Glucose, Bld: 93 mg/dL (ref 70–99)
Phosphorus: 5.2 mg/dL — ABNORMAL HIGH (ref 2.5–4.6)
Potassium: 4.1 mmol/L (ref 3.5–5.1)
Sodium: 139 mmol/L (ref 135–145)

## 2018-11-24 MED ORDER — ONDANSETRON HCL 4 MG PO TABS: 4.0000 mg | ORAL_TABLET | Freq: Every day | ORAL | 0 refills | Status: AC | PRN

## 2018-11-24 MED ORDER — AMLODIPINE BESYLATE 10 MG PO TABS: 10.0000 mg | ORAL_TABLET | Freq: Every day | ORAL | 0 refills | Status: DC

## 2018-11-24 MED ORDER — FUROSEMIDE 80 MG PO TABS: 80.0000 mg | ORAL_TABLET | Freq: Two times a day (BID) | ORAL | 0 refills | Status: AC

## 2018-11-24 MED FILL — FUROSEMIDE 80 MG TAB: 80 | 30 days supply | Qty: 60 | Fill #0

## 2018-11-24 MED FILL — AMLODIPINE BESYLATE 10 MG T: 10 | 30 days supply | Qty: 30 | Fill #0

## 2018-11-24 MED FILL — ONDANSETRON HCL 4 MG TABLET: 4 | 30 days supply | Qty: 30 | Fill #0

## 2018-11-24 NOTE — Progress Notes (Addendum)
Pt hx of DM2 so this RN paged MD for CBG monitoring order and got a response that pt's sugar is currently well controlled so will hold off on sliding insulin and blood sugar checks. Will continue to monitor.

## 2018-11-24 NOTE — Progress Notes (Signed)
Patient discharged to home. Verbalized understanding of all discharge instructions including discharge medications and follow up MD visits. 

## 2018-11-24 NOTE — Progress Notes (Addendum)
Mistake on the discharge reconciliation. Both prozac and sertraline were continued; however, only prozac should be continued. Called patient to update her on this information. Left a voicemail on the phone number listed in demographics. Patient instructed to call [phone] with any questions. Have also sent discharge summary to patient's PCP to ensure follow up of correct medicaiton.  Wilber Oliphant, M.D.  PGY-2  Family Medicine  [phone] 11/24/2018 4:45 PM

## 2018-11-24 NOTE — Discharge Instructions (Signed)
Thank you for allowing [place] to take part in your care!   You were admitted to the hospital for prolonged vomiting and decreased urine output.   Plea

## 2018-11-24 NOTE — Progress Notes (Signed)
Subjective:  Not much urine recorded- crt down some today - BP is much better probably because she was given meds, she wants to eat    Objective Vital signs in last 24 hours: Vitals:   11/23/18 1533 11/23/18 1959 11/24/18 0500 11/24/18 0543  BP: 135/67 133/67  (!) 145/85  Pulse: 71 77  68  Resp: 16 17  18   Temp: 98.9 F (37.2 C) 98.5 F (36.9 C)  97.6 F (36.4 C)  TempSrc: Oral Oral  Oral  SpO2: 100% 99%  100%  Weight:   55.8 kg   Height:       Weight change: 2.8 kg  Intake/Output Summary (Last 24 hours) at 11/24/2018 1008 Last data filed at 11/23/2018 1930 Gross per 24 hour  Intake 327 ml  Output -  Net 327 ml    Assessment/Plan: 58 year old black female with poorly controlled diabetes, hypertension also with FSG by renal biopsy with progressive renal insufficiency and noncompliance 1.Renal-renal biopsy consistent with possible primary FSGS as well as diabetic changes.  She was prescribed Prograf 7 months ago but really has not been taking it consistently.  She did have a level of 6.5 when I saw her in June with a creatinine of 2.55.  She will tell you that she is doing absolutely everything that she can to not be on dialysis but she will not take her medicines.  I cannot say for sure if she is failed Prograf or just has not taken it consistently enough.  With albumin of less than 2 she is clearly still nephrotic.  Options for FSG are kind of limited and it can sometimes be refractory to treatment.  This along with her brittle diabetes I do not think bodes well for her kidney function for the future.  I would not expect her nausea and vomiting to be due to uremia as her GFR is still close to 20.  She has been put back on prograf- level will not be back for likely another 24 hours. There is nothing that acute going on here.  I am considering in my mind treating her with rituximab as OP (forced compliance) but that is nothing that needs to be decided here 2. Hypertension/volume   -hypertensive and volume overloaded.   resumed her Lasix at a higher dose of 80 twice daily to compensate for her worsening GFR- better  3. Anemia  -has been an issue for her in the past.  Probably has some anemia now due to renal insufficiency.  Will check iron stores (not done, I guess I forgot to order)  and gave her a dose of Aranesp on 8/6  Pt has an appt with me on Monday.  I do not think she has acute indications for hospitalization from a renal standpoint and her nausea is better.  I am ok with discharge, renal will sign off     [place]: Basic Metabolic Panel: Recent Labs  Lab 11/22/18 1509 11/23/18 0253 11/24/18 0502  NA 143 138 139  K 4.5 4.0 4.1  CL 111 106 110  CO2 24 23 21*  GLUCOSE 196* 122* 93  BUN 28* 33* 32*  CREATININE 3.18* 3.27* 3.11*  CALCIUM 8.6* 7.9* 8.1*  PHOS  --  4.9* 5.2*   Liver Function Tests: Recent Labs  Lab 11/22/18 1509 11/23/18 0253 11/24/18 0502  AST 22  --   --   ALT 10  --   --   ALKPHOS 69  --   --  BILITOT 0.6  --   --   PROT 5.8*  --   --   ALBUMIN 2.0* 1.7* 1.7*   Recent Labs  Lab 11/22/18 1509  LIPASE 23   No results for input(s): AMMONIA in the last 168 hours. CBC: Recent Labs  Lab 11/22/18 1215 11/22/18 1509 11/23/18 0253  WBC  --  10.0 11.3*  NEUTROABS  --  5.9 5.0  HGB 8.1* 9.2* 8.0*  HCT  --  30.3* 25.6*  MCV  --  92.9 91.8  PLT  --  265 227   Cardiac Enzymes: No results for input(s): CKTOTAL, CKMB, CKMBINDEX, TROPONINI in the last 168 hours. CBG: No results for input(s): GLUCAP in the last 168 hours.  Iron Studies: No results for input(s): IRON, TIBC, TRANSFERRIN, FERRITIN in the last 72 hours. Studies/Results: Ct Abdomen Pelvis Wo Contrast  Result Date: 11/22/2018 CLINICAL DATA:  Nausea and vomiting for the past month. EXAM: CT ABDOMEN AND PELVIS WITHOUT CONTRAST TECHNIQUE: Multidetector CT imaging of the abdomen and pelvis was performed following the standard protocol without  IV contrast. COMPARISON:  09/30/2017. FINDINGS: Lower chest: Clear lung bases. Hepatobiliary: No focal liver abnormality is seen. No gallstones, gallbladder wall thickening, or biliary dilatation. Pancreas: Unremarkable. No pancreatic ductal dilatation or surrounding inflammatory changes. Spleen: Normal in size without focal abnormality. Adrenals/Urinary Tract: Mild left adrenal hyperplasia. Normal appearing right adrenal gland. Unremarkable kidneys, ureters and urinary bladder. Stomach/Bowel: Mild sigmoid colon diverticulosis. No evidence of diverticulitis. The previously demonstrated possible transverse colon wall thickening is no longer seen. Normal appearing appendix, small bowel and stomach. Vascular/Lymphatic: Mild atheromatous arterial calcifications without aneurysm. No enlarged lymph nodes. Reproductive: Uterus and bilateral adnexa are unremarkable. Other: No abdominal wall hernia or abnormality. No abdominopelvic ascites. Musculoskeletal: Minimal lumbar and lower thoracic spine degenerative changes. IMPRESSION: No acute abnormality. Electronically Signed   By: Claudie Revering M.D.   On: 11/22/2018 17:16   Dg Ribs Unilateral Left  Result Date: 11/22/2018 CLINICAL DATA:  Left rib pain.  Previous fall. EXAM: LEFT RIBS - 2 VIEW COMPARISON:  Chest two-view 07/18/2017 FINDINGS: No fracture or other bone lesions are seen involving the ribs. IMPRESSION: Negative. Electronically Signed   By: Franchot Gallo M.D.   On: 11/22/2018 16:55   US Abdomen Limited Ruq  Result Date: 11/23/2018 CLINICAL DATA:  Vomiting for 1 month. EXAM: ULTRASOUND ABDOMEN LIMITED RIGHT UPPER QUADRANT COMPARISON:  None. FINDINGS: Gallbladder: No gallstones or wall thickening visualized. No sonographic Murphy sign noted by sonographer. Common bile duct: Diameter: 0.5 cm Liver: No focal lesion identified. Within normal limits in parenchymal echogenicity. Portal vein is patent on color Doppler imaging with normal direction of blood flow  towards the liver. Other: None. IMPRESSION: Normal examination.  Negative for gallstones. Electronically Signed   By: Inge Rise M.D.   On: 11/23/2018 07:50   Medications: Infusions:   Scheduled Medications: . amLODipine  10 mg Oral Daily  . darbepoetin (ARANESP) injection - NON-DIALYSIS  100 mcg Subcutaneous Q Thu-1800  . feeding supplement (NEPRO CARB STEADY)  237 mL Oral BID BM  . FLUoxetine  20 mg Oral Daily  . furosemide  80 mg Oral BID  . heparin  5,000 Units Subcutaneous Q8H  . hydrALAZINE  25 mg Oral TID  . nicotine  14 mg Transdermal Daily  . tacrolimus  1 mg Oral QHS  . tacrolimus  2 mg Oral Daily    have reviewed scheduled and prn medications.  Physical Exam: General: NAD Heart: RRR Lungs: clear  Abdomen: soft, non tender Extremities: mild edema     11/24/2018,10:08 AM  LOS: 1 day

## 2018-11-24 NOTE — Discharge Summary (Addendum)
[facility] Discharge Summary  Patient name: Andrea Glenn Medical record number: [id number] Date of birth: 12-Mar-1961 Age: 58 y.o. Gender: female Date of Admission: 11/22/2018  Date of Discharge: 11/24/18    Admitting Physician: Martyn Malay, MD  Primary Care Provider: Guadalupe Dawn, MD Consultants: None  Indication for Hospitalization: Nausea & vomiting   Discharge Diagnoses/Problem List:  Principal Problem:   Nausea & vomiting Active Problems:   Tobacco use disorder   Hyperlipidemia associated with type 2 diabetes mellitus ([place])   Lower extremity edema   Anemia of chronic disease   CKD (chronic kidney disease)   Leg swelling   Diabetes due to underlying condition w diabetic nephropathy ([place])   Nephrotic syndrome   DM (diabetes mellitus), type 2 with renal complications ([place])   Fall   Vomiting   Disposition: Discharge to home  Discharge Condition: Stable  Discharge Exam:  BP (!) 141/75 (BP Location: Right Arm)   Pulse 68   Temp 97.6 F (36.4 C) (Oral)   Resp 18   Ht 5\' 5"  (1.651 m)   Wt 55.8 kg   SpO2 100%   BMI 20.47 kg/m    General: NAD, non-toxic, well-appearing, sitting comfortably in bed Cardiovascular: RRR, normal S1, S2. 2+ RP bilaterally. Trace BLEE  Respiratory: CTAB. No IWOB.  Abdomen: + BS. NT, ND, soft to palpation.  Extremities: Warm and well perfused. Moving spontaneously.   Brief Hospital Course:  AQSA VIK is a 58 y.o. female with past medical history significant for CKD Stage 4, FSGS on Tacrolimus, HTN, DM2, HLD presenting with weight loss, prolonged vomiting for 1 mo and decreased urine output.   Emesis for 1 month Patient was given Zofran 4 mg prior to leaving the clinic visit. During admission, she tolerated all of her meals and was without nausea or further episodes of emesis. Patient had increased appetite throughout admission.   Weight loss  Nutrition was consulted for this admission and  recommended Nephro Shake supplements twice per day. Each shake is noted to provide 425 kcal and 19 grams of protein. Patient was agreeable to trying this supplement. Weight on admission was 53kg, upon discharge weight was 55.8kg.  Decreased urine output (FSGS on Tacrolimus, CKDIV) Patient reported making brown-colored urine only once per day for two weeks. Ms. Ashabranner was followed by nephrology during this admission. Nephrology recommendations were as follows:  -obtain Tacrolimus level that is pending at the time of discharge  -increased Lasix to 80mg  twice per day  -given dose of Aranesp, iron studies completed for anemia (hbg 8.1) -Protein/Cr ratio: elevated at 12.14 -Urine Protien: 1201 -RFP: Cr. 3.27 with GFR non AF 15, AF17 Patient continued to have low urine output throughout admission with output measurements of 374mL/day. Patient will continue to follow up as outpatient with [facility].   HTN: Ms. Klassen was hypertensive throughout admission with max sys of  190 and disa 85. Her home Amlodipine 2.5mg  was increased to 10mg  daily with decrease in BP measures to 140s/70s.    Rib Pain:  Ms. Schumann reported a fall one week prior to admission in which she hit the left side of her rib cage. EKG was within normal limits and XR showed no abnormalities. Patient was given lidocaine patch and Tylenol for pain relief.  Issues for Follow Up:  1. Decreased urine output   2. Weight loss 3. Discontinued Zoloft and discharged patient on Prozac only as she was taking both on admission. Please review  these medications with this patient to make sure she is taking the correct medications.   Significant Procedures:  None  Procedure Orders     EKG 12-Lead  Significant Labs and Imaging:  Recent Labs  Lab 11/22/18 1215 11/22/18 1509 11/23/18 0253  WBC  --  10.0 11.3*  HGB 8.1* 9.2* 8.0*  HCT  --  30.3* 25.6*  PLT  --  265 227   Recent Labs  Lab 11/22/18 1509 11/23/18 0253  11/24/18 0502  NA 143 138 139  K 4.5 4.0 4.1  CL 111 106 110  CO2 24 23 21*  GLUCOSE 196* 122* 93  BUN 28* 33* 32*  CREATININE 3.18* 3.27* 3.11*  CALCIUM 8.6* 7.9* 8.1*  PHOS  --  4.9* 5.2*  ALKPHOS 69  --   --   AST 22  --   --   ALT 10  --   --   ALBUMIN 2.0* 1.7* 1.7*    Ct Abdomen Pelvis Wo Contrast  Result Date: 11/22/2018 CLINICAL DATA:  Nausea and vomiting for the past month. EXAM: CT ABDOMEN AND PELVIS WITHOUT CONTRAST TECHNIQUE: Multidetector CT imaging of the abdomen and pelvis was performed following the standard protocol without IV contrast. COMPARISON:  09/30/2017. FINDINGS: Lower chest: Clear lung bases. Hepatobiliary: No focal liver abnormality is seen. No gallstones, gallbladder wall thickening, or biliary dilatation. Pancreas: Unremarkable. No pancreatic ductal dilatation or surrounding inflammatory changes. Spleen: Normal in size without focal abnormality. Adrenals/Urinary Tract: Mild left adrenal hyperplasia. Normal appearing right adrenal gland. Unremarkable kidneys, ureters and urinary bladder. Stomach/Bowel: Mild sigmoid colon diverticulosis. No evidence of diverticulitis. The previously demonstrated possible transverse colon wall thickening is no longer seen. Normal appearing appendix, small bowel and stomach. Vascular/Lymphatic: Mild atheromatous arterial calcifications without aneurysm. No enlarged lymph nodes. Reproductive: Uterus and bilateral adnexa are unremarkable. Other: No abdominal wall hernia or abnormality. No abdominopelvic ascites. Musculoskeletal: Minimal lumbar and lower thoracic spine degenerative changes. IMPRESSION: No acute abnormality. Electronically Signed   By: Claudie Revering M.D.   On: 11/22/2018 17:16   Dg Ribs Unilateral Left  Result Date: 11/22/2018 CLINICAL DATA:  Left rib pain.  Previous fall. EXAM: LEFT RIBS - 2 VIEW COMPARISON:  Chest two-view 07/18/2017 FINDINGS: No fracture or other bone lesions are seen involving the ribs. IMPRESSION:  Negative. Electronically Signed   By: Franchot Gallo M.D.   On: 11/22/2018 16:55   US Abdomen Limited Ruq  Result Date: 11/23/2018 CLINICAL DATA:  Vomiting for 1 month. EXAM: ULTRASOUND ABDOMEN LIMITED RIGHT UPPER QUADRANT COMPARISON:  None. FINDINGS: Gallbladder: No gallstones or wall thickening visualized. No sonographic Murphy sign noted by sonographer. Common bile duct: Diameter: 0.5 cm Liver: No focal lesion identified. Within normal limits in parenchymal echogenicity. Portal vein is patent on color Doppler imaging with normal direction of blood flow towards the liver. Other: None. IMPRESSION: Normal examination.  Negative for gallstones. Electronically Signed   By: Inge Rise M.D.   On: 11/23/2018 07:50    Results/Tests Pending at Time of Discharge:  . Tacrolimus level   Discharge Medications:  Allergies as of 11/24/2018      Reactions   Atorvastatin Other (See Comments)   Suicidal thoughts   Statins Other (See Comments)   This class of meds causes "suicidal thoughts"   Amitriptyline Other (See Comments)   Depression   Metformin And Related Diarrhea   Adhesive [tape] Other (See Comments)   "Sometimes peels off my skin"   Gabapentin Other (See Comments)   "Gives  me the shakes"      Medication List    STOP taking these medications   sertraline 50 MG tablet Commonly known as: Zoloft     TAKE these medications   acetaminophen 500 MG tablet Commonly known as: TYLENOL Take 1,000 mg by mouth every 6 (six) hours as needed (for pain or headaches).   amLODipine 10 MG tablet Commonly known as: NORVASC Take 1 tablet (10 mg total) by mouth daily. Start taking on: November 25, 2018 What changed:   medication strength  how much to take   ammonium lactate 12 % lotion Commonly known as: AmLactin Apply 1 application topically daily. Apply to both feet daily   bisacodyl 5 MG EC tablet Commonly known as: DULCOLAX Take 1 tablet (5 mg total) by mouth daily as needed for moderate  constipation.   capsaicin 0.025 % cream Commonly known as: ZOSTRIX Apply topically 2 (two) times daily as needed (pain).   Dulaglutide 1.5 MG/0.5ML Sopn Inject 1.5 mg into the skin once a week.   FLUoxetine 20 MG capsule Commonly known as: PROZAC Take 1 capsule (20 mg total) by mouth daily.   furosemide 80 MG tablet Commonly known as: LASIX Take 1 tablet (80 mg total) by mouth 2 (two) times daily. What changed:   medication strength  how much to take   hydrALAZINE 25 MG tablet Commonly known as: APRESOLINE Take 1 tablet (25 mg total) by mouth 3 (three) times daily for 30 days.   hydrOXYzine 25 MG tablet Commonly known as: ATARAX/VISTARIL Take 1 tablet (25 mg total) by mouth 3 (three) times daily as needed. What changed: reasons to take this   levocetirizine 5 MG tablet Commonly known as: XYZAL TAKE 1 TABLET BY MOUTH EVERY EVENING *NOT USE WITH BENADRYL** What changed: See the new instructions.   ondansetron 4 MG tablet Commonly known as: Zofran Take 1 tablet (4 mg total) by mouth daily as needed for nausea or vomiting.   polyvinyl alcohol 1.4 % ophthalmic solution Commonly known as: LIQUIFILM TEARS Place 2 drops into both eyes 4 (four) times daily.   tacrolimus 1 MG capsule Commonly known as: PROGRAF Take 1-2 mg by mouth See admin instructions. 2mg  in the am and 1mg  at pm   triamcinolone cream 0.1 % Commonly known as: KENALOG Apply 1 application topically 2 (two) times daily.       Discharge Instructions: Please refer to Patient Instructions section of EMR for full details.  Patient was counseled important signs and symptoms that should prompt return to medical care, changes in medications, dietary instructions, activity restrictions, and follow up appointments.   Follow-Up Appointments: Future Appointments  Date Time Provider [place]  12/15/2018  2:30 PM Marzetta Board, [place] [facility]    Stark Klein, MD 11/24/2018, 4:41  PM PGY-2, [place]

## 2018-11-26 LAB — TACROLIMUS LEVEL: Tacrolimus (FK506) - LabCorp: 1.8 ng/mL — ABNORMAL LOW (ref 2.0–20.0)

## 2018-11-30 ENCOUNTER — Telehealth: Payer: Self-pay | Admitting: Family Medicine

## 2018-11-30 NOTE — Telephone Encounter (Signed)
Informed patient of the need to make an appointment with [facility] because her tacrolimus levels were low per Dr. Kris Mouton.  Andrea Glenn, [name]

## 2018-11-30 NOTE — Telephone Encounter (Signed)
Please let the patient know that her tacrolimus levels (which were taken while she was in the hospital) are very low and that she needs to contact [facility] and set up an appointment with them for further management. They prescribe this medication for the patient.  Guadalupe Dawn MD PGY-3 Family Medicine Resident

## 2018-12-04 ENCOUNTER — Telehealth: Payer: Self-pay

## 2018-12-04 NOTE — Telephone Encounter (Signed)
Pt is returning [facility] phone call. She received her vm but would like to discuss what they told her when she contacted [facility].   Please call pt to discuss.

## 2018-12-04 NOTE — Telephone Encounter (Addendum)
Called to inquire as to what patient wanted to discuss.  Call was disconnected. Attempted to call back and got voicemail.  Informed patient via voicemail that if she has any further questions to call clinic.  Ozella Almond, [place]

## 2018-12-05 NOTE — Discharge Instructions (Signed)

## 2018-12-06 ENCOUNTER — Encounter (HOSPITAL_COMMUNITY): Payer: Self-pay

## 2018-12-06 ENCOUNTER — Inpatient Hospital Stay (HOSPITAL_COMMUNITY)
Admission: RE | Admit: 2018-12-06 | Discharge: 2018-12-06 | Disposition: A | Discharge status: Home / Self Care | Payer: Medicaid Other | Source: Ambulatory Visit | Attending: Nephrology | Admitting: Nephrology

## 2018-12-06 ENCOUNTER — Encounter (HOSPITAL_COMMUNITY): Payer: Medicaid Other

## 2018-12-08 ENCOUNTER — Ambulatory Visit: Payer: Medicaid Other | Admitting: Family Medicine

## 2018-12-15 ENCOUNTER — Ambulatory Visit: Payer: Medicaid Other | Admitting: Podiatry

## 2018-12-20 ENCOUNTER — Encounter (HOSPITAL_COMMUNITY): Payer: Medicaid Other

## 2018-12-22 ENCOUNTER — Ambulatory Visit (HOSPITAL_COMMUNITY)
Admission: RE | Admit: 2018-12-22 | Discharge: 2018-12-22 | Disposition: A | Discharge status: Home / Self Care | Payer: Medicaid Other | Source: Ambulatory Visit | Attending: Nephrology | Admitting: Nephrology

## 2018-12-22 ENCOUNTER — Other Ambulatory Visit: Payer: Self-pay

## 2018-12-22 VITALS — BP 155/80 | HR 78 | Temp 96.8°F | Resp 18

## 2018-12-22 DIAGNOSIS — N183 Chronic kidney disease, stage 3 unspecified: Secondary | ICD-10-CM

## 2018-12-22 MED ORDER — EPOETIN ALFA-EPBX 10000 UNIT/ML IJ SOLN
20000.0000 [IU] | INTRAMUSCULAR | Status: DC
  Administered 2018-12-22: 12:00:00 20000 [IU] via SUBCUTANEOUS
  Filled 2018-12-22: qty 2

## 2018-12-26 LAB — POCT HEMOGLOBIN-HEMACUE: Hemoglobin: 11 g/dL — ABNORMAL LOW (ref 12.0–15.0)

## 2019-01-03 ENCOUNTER — Other Ambulatory Visit: Payer: Self-pay

## 2019-01-03 ENCOUNTER — Ambulatory Visit (INDEPENDENT_AMBULATORY_CARE_PROVIDER_SITE_OTHER): Payer: Medicaid Other | Admitting: Family Medicine

## 2019-01-03 DIAGNOSIS — N183 Chronic kidney disease, stage 3 unspecified: Secondary | ICD-10-CM

## 2019-01-03 DIAGNOSIS — F17200 Nicotine dependence, unspecified, uncomplicated: Secondary | ICD-10-CM

## 2019-01-03 MED ORDER — WALKER MISC: 1.0000 | 0 refills | Status: AC | PRN

## 2019-01-03 MED ORDER — NICOTINE 7 MG/24HR TD PT24: 7.0000 mg | MEDICATED_PATCH | Freq: Every day | TRANSDERMAL | 0 refills | Status: DC

## 2019-01-03 MED ORDER — ACCU-CHEK AVIVA PLUS W/DEVICE KIT: 1.0000 | PACK | Freq: Three times a day (TID) | 0 refills | Status: AC

## 2019-01-03 MED ORDER — BLOOD PRESSURE CUFF MISC: 1.0000 | 0 refills | Status: AC | PRN

## 2019-01-03 NOTE — Patient Instructions (Signed)
It was great seeing you today!  I am glad that things have been going well medically since leaving the hospital, but I am sorry to hear about the passing of your family members and the subsequent family drama.  Sounds like the nephrologist is managing your diuresis pretty well, sure to follow-up with them on Friday for this injection.  I gave you a printed prescription for a walker, glucose meter, blood pressure cuff, and nicotine patch.  If you cannot get the walker from your pharmacy I have attached a list of medical supply stores you should be able to turn the prescription in.  [facility] 3.5   (52)  Medical supply store [place], [place] Open ? Closes 6PM  [phone]  [facility] Supply 2.0   (3)  Medical supply store [address] Open ? Closes 6PM  [phone]  [place] 4.6   [phone])  Medical supply store [place] Open ? Closes 5:30PM  [phone]  In-store shopping  [place] 3.5   [phone])  Medical supply store [address] Open ? Closes 6PM  [phone]  In-store shopping  [place] Supply 4.0   (2)  Medical supply store [address] [phone]  [place] 2.7   (28)  Medical supply store [place] ? Closes 5PM  [phone]  [employer] 5.0   (7)  Medical supply store [address]

## 2019-01-04 ENCOUNTER — Other Ambulatory Visit (HOSPITAL_COMMUNITY): Payer: Self-pay | Admitting: *Deleted

## 2019-01-05 ENCOUNTER — Inpatient Hospital Stay (HOSPITAL_COMMUNITY): Admission: RE | Admit: 2019-01-05 | Payer: Medicaid Other | Source: Ambulatory Visit

## 2019-01-06 ENCOUNTER — Encounter: Payer: Self-pay | Admitting: Family Medicine

## 2019-01-06 NOTE — Progress Notes (Signed)
   HPI 58 year old female who presents for hospital follow-up and medical equipment.  Patient was admitted on 11/22/2018 for nausea and decreased urine output.  At the hospitalization she was noted to have a subtherapeutic tacrolimus level.  She takes this for FSGS which is caused her to have stage IV chronic kidney disease.  She states that she is no longer taking the tacrolimus.  Her nephrologist is "injecting her once a week with something".  She goes to the hospital for this injection.  She is not sure which medication she has been injected with.  Nephrology is managing her diuresis, she takes Lasix 80 mg twice daily.  Patient is also requesting a prescription for a walker, glucometer, blood pressure cuff, nicotine patches.  CC: Hospital follow-up and medical supplies   ROS:   Review of Systems See HPI for ROS.   CC, SH/smoking status, and VS noted  Objective: BP 126/80   Pulse 77   Wt 126 lb 9.6 oz (57.4 kg)   SpO2 99%   BMI 21.07 kg/m  Gen: Well-appearing 58 year old African-American female, no acute distress, resting comfortably HEENT: Orange contact lenses noted CV: Regular rate rhythm, no M/R/G.  1+ pitting edema bilateral lower extremity Resp: lungs clear to auscultation bilaterally Neuro: Alert and oriented, Speech clear, No gross deficits   Assessment and plan:  CKD (chronic kidney disease) Managed by nephrology.  Taking Lasix 80 mg twice daily.  No longer taking tacrolimus, and is getting an unknown injection of some sort weekly in the hospital.  We will try and get nephrology records for further clarification of this medication.  Tobacco use disorder Current everyday smoker.  Patient attempting to quit.  Smokes about half pack per day.  Gave 7 mg nicotine patch prescription to patient.  Patient to follow-up in a few weeks to discuss smoking cessation and check her progress.   No orders of the defined types were placed in this encounter.   Meds ordered this  encounter  Medications  . Misc. Devices (WALKER) MISC    Sig: 1 each by Does not apply route as needed.    Dispense:  1 each    Refill:  0  . Blood Pressure Monitoring (BLOOD PRESSURE CUFF) MISC    Sig: 1 each by Does not apply route as needed.    Dispense:  1 each    Refill:  0  . Blood Glucose Monitoring Suppl (ACCU-CHEK AVIVA PLUS) w/Device KIT    Sig: 1 each by Does not apply route 3 (three) times daily with meals.    Dispense:  1 kit    Refill:  0  . nicotine (NICODERM CQ - DOSED IN MG/24 HR) 7 mg/24hr patch    Sig: Place 1 patch (7 mg total) onto the skin daily.    Dispense:  28 patch    Refill:  0     Guadalupe Dawn MD PGY-3 Family Medicine Resident  01/06/2019 11:53 PM

## 2019-01-06 NOTE — Assessment & Plan Note (Signed)
Current everyday smoker.  Patient attempting to quit.  Smokes about half pack per day.  Gave 7 mg nicotine patch prescription to patient.  Patient to follow-up in a few weeks to discuss smoking cessation and check her progress.

## 2019-01-06 NOTE — Assessment & Plan Note (Signed)
Managed by nephrology.  Taking Lasix 80 mg twice daily.  No longer taking tacrolimus, and is getting an unknown injection of some sort weekly in the hospital.  We will try and get nephrology records for further clarification of this medication.

## 2019-01-12 ENCOUNTER — Ambulatory Visit: Payer: Medicaid Other | Admitting: Podiatry

## 2019-01-12 ENCOUNTER — Encounter (HOSPITAL_COMMUNITY)
Admission: RE | Admit: 2019-01-12 | Discharge: 2019-01-12 | Disposition: A | Discharge status: Home / Self Care | Payer: Medicaid Other | Source: Ambulatory Visit | Attending: Nephrology | Admitting: Nephrology

## 2019-01-12 ENCOUNTER — Other Ambulatory Visit: Payer: Self-pay

## 2019-01-12 VITALS — BP 180/78 | HR 78 | Temp 97.6°F | Resp 18 | Ht 65.0 in | Wt 127.0 lb

## 2019-01-12 DIAGNOSIS — N183 Chronic kidney disease, stage 3 unspecified: Secondary | ICD-10-CM

## 2019-01-12 LAB — POCT HEMOGLOBIN-HEMACUE: Hemoglobin: 11.3 g/dL — ABNORMAL LOW (ref 12.0–15.0)

## 2019-01-12 MED ORDER — METHYLPREDNISOLONE SODIUM SUCC 125 MG IJ SOLR
100.0000 mg | INTRAMUSCULAR | Status: DC
  Administered 2019-01-12: 09:00:00 100 mg via INTRAVENOUS

## 2019-01-12 MED ORDER — ACETAMINOPHEN 325 MG PO TABS
ORAL_TABLET | ORAL | Status: AC
  Administered 2019-01-12: 650 mg via ORAL
  Filled 2019-01-12: qty 2

## 2019-01-12 MED ORDER — SODIUM CHLORIDE 0.9 % IV SOLN
600.0000 mg | INTRAVENOUS | Status: DC
  Administered 2019-01-12: 600 mg via INTRAVENOUS
  Filled 2019-01-12: qty 50

## 2019-01-12 MED ORDER — METHYLPREDNISOLONE SODIUM SUCC 125 MG IJ SOLR
INTRAMUSCULAR | Status: AC
  Administered 2019-01-12: 100 mg via INTRAVENOUS
  Filled 2019-01-12: qty 2

## 2019-01-12 MED ORDER — ACETAMINOPHEN 325 MG PO TABS
650.0000 mg | ORAL_TABLET | ORAL | Status: DC
  Administered 2019-01-12: 09:00:00 650 mg via ORAL

## 2019-01-12 MED ORDER — DIPHENHYDRAMINE HCL 25 MG PO CAPS
ORAL_CAPSULE | ORAL | Status: AC
  Administered 2019-01-12: 25 mg via ORAL
  Filled 2019-01-12: qty 1

## 2019-01-12 MED ORDER — EPOETIN ALFA-EPBX 10000 UNIT/ML IJ SOLN
20000.0000 [IU] | INTRAMUSCULAR | Status: DC
  Administered 2019-01-12: 20000 [IU] via SUBCUTANEOUS
  Filled 2019-01-12: qty 2

## 2019-01-12 MED ORDER — DIPHENHYDRAMINE HCL 25 MG PO CAPS
25.0000 mg | ORAL_CAPSULE | ORAL | Status: DC
  Administered 2019-01-12: 09:00:00 25 mg via ORAL

## 2019-01-12 NOTE — Discharge Instructions (Signed)
Rituximab injection What is this medicine? RITUXIMAB (ri TUX i mab) is a monoclonal antibody. It is used to treat certain types of cancer like non-Hodgkin lymphoma and chronic lymphocytic leukemia. It is also used to treat rheumatoid arthritis, granulomatosis with polyangiitis (or Wegener's granulomatosis), microscopic polyangiitis, and pemphigus vulgaris. This medicine may be used for other purposes; ask your health care provider or pharmacist if you have questions. COMMON BRAND NAME(S): Rituxan, RUXIENCE What should I tell my health care provider before I take this medicine? They need to know if you have any of these conditions:  heart disease  infection (especially a virus infection such as hepatitis B, chickenpox, cold sores, or herpes)  immune system problems  irregular heartbeat  kidney disease  low blood counts, like low white cell, platelet, or red cell counts  lung or breathing disease, like asthma  recently received or scheduled to receive a vaccine  an unusual or allergic reaction to rituximab, other medicines, foods, dyes, or preservatives  pregnant or trying to get pregnant  breast-feeding How should I use this medicine? This medicine is for infusion into a vein. It is administered in a hospital or clinic by a specially trained health care professional. A special MedGuide will be given to you by the pharmacist with each prescription and refill. Be sure to read this information carefully each time. Talk to your pediatrician regarding the use of this medicine in children. This medicine is not approved for use in children. Overdosage: If you think you have taken too much of this medicine contact a poison control center or emergency room at once. NOTE: This medicine is only for you. Do not share this medicine with others. What if I miss a dose? It is important not to miss a dose. Call your doctor or health care professional if you are unable to keep an appointment. What  may interact with this medicine?  cisplatin  live virus vaccines This list may not describe all possible interactions. Give your health care provider a list of all the medicines, herbs, non-prescription drugs, or dietary supplements you use. Also tell them if you smoke, drink alcohol, or use illegal drugs. Some items may interact with your medicine. What should I watch for while using this medicine? Your condition will be monitored carefully while you are receiving this medicine. You may need blood work done while you are taking this medicine. This medicine can cause serious allergic reactions. To reduce your risk you may need to take medicine before treatment with this medicine. Take your medicine as directed. In some patients, this medicine may cause a serious brain infection that may cause death. If you have any problems seeing, thinking, speaking, walking, or standing, tell your healthcare professional right away. If you cannot reach your healthcare professional, urgently seek other source of medical care. Call your doctor or health care professional for advice if you get a fever, chills or sore throat, or other symptoms of a cold or flu. Do not treat yourself. This drug decreases your body's ability to fight infections. Try to avoid being around people who are sick. Do not become pregnant while taking this medicine or for at least 12 months after stopping it. Women should inform their doctor if they wish to become pregnant or think they might be pregnant. There is a potential for serious side effects to an unborn child. Talk to your health care professional or pharmacist for more information. Do not breast-feed an infant while taking this medicine or for at   least 6 months after stopping it. What side effects may I notice from receiving this medicine? Side effects that you should report to your doctor or health care professional as soon as possible:  allergic reactions like skin rash, itching or  hives; swelling of the face, lips, or tongue  breathing problems  chest pain  changes in vision  diarrhea  headache with fever, neck stiffness, sensitivity to light, nausea, or confusion  fast, irregular heartbeat  loss of memory  low blood counts - this medicine may decrease the number of white blood cells, red blood cells and platelets. You may be at increased risk for infections and bleeding.  mouth sores  problems with balance, talking, or walking  redness, blistering, peeling or loosening of the skin, including inside the mouth  signs of infection - fever or chills, cough, sore throat, pain or difficulty passing urine  signs and symptoms of kidney injury like trouble passing urine or change in the amount of urine  signs and symptoms of liver injury like dark yellow or brown urine; general ill feeling or flu-like symptoms; light-colored stools; loss of appetite; nausea; right upper belly pain; unusually weak or tired; yellowing of the eyes or skin  signs and symptoms of low blood pressure like dizziness; feeling faint or lightheaded, falls; unusually weak or tired  stomach pain  swelling of the ankles, feet, hands  unusual bleeding or bruising  vomiting Side effects that usually do not require medical attention (report to your doctor or health care professional if they continue or are bothersome):  headache  joint pain  muscle cramps or muscle pain  nausea  tiredness This list may not describe all possible side effects. Call your doctor for medical advice about side effects. You may report side effects to FDA at 1-800-FDA-1088. Where should I keep my medicine? This drug is given in a hospital or clinic and will not be stored at home. NOTE: This sheet is a summary. It may not cover all possible information. If you have questions about this medicine, talk to your doctor, pharmacist, or health care provider.  2020 Elsevier/Gold Standard (2018-05-17  22:01:36)  

## 2019-01-19 ENCOUNTER — Ambulatory Visit (HOSPITAL_COMMUNITY)
Admission: RE | Admit: 2019-01-19 | Discharge: 2019-01-19 | Disposition: A | Discharge status: Home / Self Care | Payer: Medicaid Other | Source: Ambulatory Visit | Attending: Nephrology | Admitting: Nephrology

## 2019-01-19 ENCOUNTER — Other Ambulatory Visit: Payer: Self-pay

## 2019-01-19 DIAGNOSIS — N183 Chronic kidney disease, stage 3 unspecified: Secondary | ICD-10-CM | POA: Diagnosis not present

## 2019-01-19 DIAGNOSIS — D631 Anemia in chronic kidney disease: Secondary | ICD-10-CM | POA: Diagnosis not present

## 2019-01-19 MED ORDER — DIPHENHYDRAMINE HCL 25 MG PO CAPS
25.0000 mg | ORAL_CAPSULE | ORAL | Status: DC
  Administered 2019-01-19: 25 mg via ORAL

## 2019-01-19 MED ORDER — ACETAMINOPHEN 325 MG PO TABS
ORAL_TABLET | ORAL | Status: AC
  Filled 2019-01-19: qty 2

## 2019-01-19 MED ORDER — DIPHENHYDRAMINE HCL 25 MG PO CAPS
ORAL_CAPSULE | ORAL | Status: AC
  Filled 2019-01-19: qty 1

## 2019-01-19 MED ORDER — SODIUM CHLORIDE 0.9 % IV SOLN
600.0000 mg | INTRAVENOUS | Status: DC
  Administered 2019-01-19: 600 mg via INTRAVENOUS
  Filled 2019-01-19: qty 50

## 2019-01-19 MED ORDER — METHYLPREDNISOLONE SODIUM SUCC 125 MG IJ SOLR
100.0000 mg | INTRAMUSCULAR | Status: DC
  Administered 2019-01-19: 100 mg via INTRAVENOUS

## 2019-01-19 MED ORDER — ACETAMINOPHEN 325 MG PO TABS
650.0000 mg | ORAL_TABLET | ORAL | Status: DC
  Administered 2019-01-19: 650 mg via ORAL

## 2019-01-19 MED ORDER — METHYLPREDNISOLONE SODIUM SUCC 125 MG IJ SOLR
INTRAMUSCULAR | Status: AC
  Filled 2019-01-19: qty 2

## 2019-01-24 ENCOUNTER — Ambulatory Visit: Payer: Medicaid Other

## 2019-01-26 ENCOUNTER — Other Ambulatory Visit: Payer: Self-pay

## 2019-01-26 ENCOUNTER — Encounter (HOSPITAL_COMMUNITY)
Admission: RE | Admit: 2019-01-26 | Discharge: 2019-01-26 | Disposition: A | Discharge status: Home / Self Care | Payer: Medicaid Other | Source: Ambulatory Visit | Attending: Nephrology | Admitting: Nephrology

## 2019-01-26 DIAGNOSIS — N051 Unspecified nephritic syndrome with focal and segmental glomerular lesions: Secondary | ICD-10-CM | POA: Diagnosis not present

## 2019-01-26 LAB — RENAL FUNCTION PANEL
Albumin: 2 g/dL — ABNORMAL LOW (ref 3.5–5.0)
Anion gap: 7 (ref 5–15)
BUN: 63 mg/dL — ABNORMAL HIGH (ref 6–20)
CO2: 22 mmol/L (ref 22–32)
Calcium: 7.9 mg/dL — ABNORMAL LOW (ref 8.9–10.3)
Chloride: 109 mmol/L (ref 98–111)
Creatinine, Ser: 4.01 mg/dL — ABNORMAL HIGH (ref 0.44–1.00)
GFR calc Af Amer: 13 mL/min — ABNORMAL LOW (ref >60)
GFR calc non Af Amer: 11 mL/min — ABNORMAL LOW (ref >60)
Glucose, Bld: 271 mg/dL — ABNORMAL HIGH (ref 70–99)
Phosphorus: 6.5 mg/dL — ABNORMAL HIGH (ref 2.5–4.6)
Potassium: 5.9 mmol/L — ABNORMAL HIGH (ref 3.5–5.1)
Sodium: 138 mmol/L (ref 135–145)

## 2019-01-26 LAB — IRON AND TIBC
Iron: 70 ug/dL (ref 28–170)
Saturation Ratios: 36 % — ABNORMAL HIGH (ref 10.4–31.8)
TIBC: 192 ug/dL — ABNORMAL LOW (ref 250–450)
UIBC: 122 ug/dL

## 2019-01-26 LAB — POCT HEMOGLOBIN-HEMACUE: Hemoglobin: 12.3 g/dL (ref 12.0–15.0)

## 2019-01-26 LAB — FERRITIN: Ferritin: 217 ng/mL (ref 11–307)

## 2019-01-26 MED ORDER — EPOETIN ALFA-EPBX 10000 UNIT/ML IJ SOLN
20000.0000 [IU] | INTRAMUSCULAR | Status: DC
  Filled 2019-01-26: qty 2

## 2019-01-26 MED ORDER — METHYLPREDNISOLONE SODIUM SUCC 125 MG IJ SOLR
INTRAMUSCULAR | Status: AC
  Administered 2019-01-26: 100 mg via INTRAVENOUS
  Filled 2019-01-26: qty 2

## 2019-01-26 MED ORDER — METHYLPREDNISOLONE SODIUM SUCC 125 MG IJ SOLR
100.0000 mg | INTRAMUSCULAR | Status: DC
  Administered 2019-01-26: 09:00:00 100 mg via INTRAVENOUS

## 2019-01-26 MED ORDER — DIPHENHYDRAMINE HCL 25 MG PO CAPS
25.0000 mg | ORAL_CAPSULE | ORAL | Status: DC
  Administered 2019-01-26: 09:00:00 25 mg via ORAL

## 2019-01-26 MED ORDER — ACETAMINOPHEN 325 MG PO TABS
650.0000 mg | ORAL_TABLET | ORAL | Status: DC
  Administered 2019-01-26: 09:00:00 650 mg via ORAL

## 2019-01-26 MED ORDER — DIPHENHYDRAMINE HCL 25 MG PO CAPS
ORAL_CAPSULE | ORAL | Status: AC
  Administered 2019-01-26: 25 mg via ORAL
  Filled 2019-01-26: qty 1

## 2019-01-26 MED ORDER — SODIUM CHLORIDE 0.9 % IV SOLN
600.0000 mg | INTRAVENOUS | Status: DC
  Administered 2019-01-26: 600 mg via INTRAVENOUS
  Filled 2019-01-26: qty 50

## 2019-01-26 MED ORDER — ACETAMINOPHEN 325 MG PO TABS
ORAL_TABLET | ORAL | Status: AC
  Administered 2019-01-26: 650 mg via ORAL
  Filled 2019-01-26: qty 2

## 2019-02-02 ENCOUNTER — Ambulatory Visit (HOSPITAL_COMMUNITY)
Admission: RE | Admit: 2019-02-02 | Discharge: 2019-02-02 | Disposition: A | Discharge status: Home / Self Care | Payer: Medicaid Other | Source: Ambulatory Visit | Attending: Nephrology | Admitting: Nephrology

## 2019-02-02 ENCOUNTER — Other Ambulatory Visit: Payer: Self-pay

## 2019-02-02 DIAGNOSIS — N051 Unspecified nephritic syndrome with focal and segmental glomerular lesions: Secondary | ICD-10-CM | POA: Diagnosis present

## 2019-02-02 MED ORDER — DIPHENHYDRAMINE HCL 25 MG PO CAPS
ORAL_CAPSULE | ORAL | Status: AC
  Administered 2019-02-02: 25 mg via ORAL
  Filled 2019-02-02: qty 1

## 2019-02-02 MED ORDER — ACETAMINOPHEN 325 MG PO TABS
ORAL_TABLET | ORAL | Status: AC
  Administered 2019-02-02: 08:00:00 650 mg via ORAL
  Filled 2019-02-02: qty 2

## 2019-02-02 MED ORDER — SODIUM CHLORIDE 0.9 % IV SOLN
600.0000 mg | INTRAVENOUS | Status: DC
  Administered 2019-02-02: 600 mg via INTRAVENOUS
  Filled 2019-02-02: qty 50

## 2019-02-02 MED ORDER — ACETAMINOPHEN 325 MG PO TABS
650.0000 mg | ORAL_TABLET | ORAL | Status: DC
  Administered 2019-02-02: 08:00:00 650 mg via ORAL

## 2019-02-02 MED ORDER — DIPHENHYDRAMINE HCL 25 MG PO CAPS
25.0000 mg | ORAL_CAPSULE | ORAL | Status: DC
  Administered 2019-02-02: 08:00:00 25 mg via ORAL

## 2019-02-02 MED ORDER — METHYLPREDNISOLONE SODIUM SUCC 125 MG IJ SOLR
INTRAMUSCULAR | Status: AC
  Administered 2019-02-02: 100 mg via INTRAVENOUS
  Filled 2019-02-02: qty 2

## 2019-02-02 MED ORDER — METHYLPREDNISOLONE SODIUM SUCC 125 MG IJ SOLR
100.0000 mg | INTRAMUSCULAR | Status: DC
  Administered 2019-02-02: 08:00:00 100 mg via INTRAVENOUS

## 2019-02-06 ENCOUNTER — Ambulatory Visit: Payer: Medicaid Other | Admitting: Family Medicine

## 2019-02-09 ENCOUNTER — Other Ambulatory Visit: Payer: Self-pay

## 2019-02-09 ENCOUNTER — Ambulatory Visit (HOSPITAL_COMMUNITY)
Admission: RE | Admit: 2019-02-09 | Discharge: 2019-02-09 | Disposition: A | Discharge status: Home / Self Care | Payer: Medicaid Other | Source: Ambulatory Visit | Attending: Nephrology | Admitting: Nephrology

## 2019-02-09 DIAGNOSIS — N051 Unspecified nephritic syndrome with focal and segmental glomerular lesions: Secondary | ICD-10-CM | POA: Diagnosis present

## 2019-02-09 LAB — POCT HEMOGLOBIN-HEMACUE: Hemoglobin: 11.6 g/dL — ABNORMAL LOW (ref 12.0–15.0)

## 2019-02-09 MED ORDER — EPOETIN ALFA-EPBX 10000 UNIT/ML IJ SOLN
20000.0000 [IU] | INTRAMUSCULAR | Status: DC
  Administered 2019-02-09: 08:00:00 20000 [IU] via SUBCUTANEOUS
  Filled 2019-02-09: qty 2

## 2019-02-23 ENCOUNTER — Encounter (HOSPITAL_COMMUNITY): Payer: Medicaid Other

## 2019-03-09 ENCOUNTER — Encounter (HOSPITAL_COMMUNITY): Payer: Medicaid Other

## 2019-03-30 ENCOUNTER — Ambulatory Visit: Payer: Medicaid Other | Admitting: Podiatry

## 2019-04-07 ENCOUNTER — Other Ambulatory Visit: Payer: Self-pay

## 2019-04-07 ENCOUNTER — Inpatient Hospital Stay (HOSPITAL_COMMUNITY)
Admission: EM | Admit: 2019-04-07 | Discharge: 2019-04-20 | DRG: 871 | Disposition: E | Payer: Medicaid Other | Attending: Internal Medicine | Admitting: Internal Medicine

## 2019-04-07 DIAGNOSIS — R0902 Hypoxemia: Secondary | ICD-10-CM

## 2019-04-07 DIAGNOSIS — Z8349 Family history of other endocrine, nutritional and metabolic diseases: Secondary | ICD-10-CM

## 2019-04-07 DIAGNOSIS — Z79899 Other long term (current) drug therapy: Secondary | ICD-10-CM

## 2019-04-07 DIAGNOSIS — B961 Klebsiella pneumoniae [K. pneumoniae] as the cause of diseases classified elsewhere: Secondary | ICD-10-CM | POA: Diagnosis present

## 2019-04-07 DIAGNOSIS — Z833 Family history of diabetes mellitus: Secondary | ICD-10-CM

## 2019-04-07 DIAGNOSIS — Z992 Dependence on renal dialysis: Secondary | ICD-10-CM

## 2019-04-07 DIAGNOSIS — N032 Chronic nephritic syndrome with diffuse membranous glomerulonephritis: Secondary | ICD-10-CM | POA: Diagnosis present

## 2019-04-07 DIAGNOSIS — R64 Cachexia: Secondary | ICD-10-CM | POA: Diagnosis not present

## 2019-04-07 DIAGNOSIS — E872 Acidosis, unspecified: Secondary | ICD-10-CM

## 2019-04-07 DIAGNOSIS — I611 Nontraumatic intracerebral hemorrhage in hemisphere, cortical: Secondary | ICD-10-CM | POA: Diagnosis not present

## 2019-04-07 DIAGNOSIS — Z1611 Resistance to penicillins: Secondary | ICD-10-CM | POA: Diagnosis present

## 2019-04-07 DIAGNOSIS — N170 Acute kidney failure with tubular necrosis: Secondary | ICD-10-CM | POA: Diagnosis present

## 2019-04-07 DIAGNOSIS — Z8249 Family history of ischemic heart disease and other diseases of the circulatory system: Secondary | ICD-10-CM

## 2019-04-07 DIAGNOSIS — T68XXXA Hypothermia, initial encounter: Secondary | ICD-10-CM | POA: Diagnosis not present

## 2019-04-07 DIAGNOSIS — Z515 Encounter for palliative care: Secondary | ICD-10-CM | POA: Diagnosis not present

## 2019-04-07 DIAGNOSIS — R296 Repeated falls: Secondary | ICD-10-CM | POA: Diagnosis present

## 2019-04-07 DIAGNOSIS — G253 Myoclonus: Secondary | ICD-10-CM | POA: Diagnosis not present

## 2019-04-07 DIAGNOSIS — J9601 Acute respiratory failure with hypoxia: Secondary | ICD-10-CM | POA: Diagnosis not present

## 2019-04-07 DIAGNOSIS — Z978 Presence of other specified devices: Secondary | ICD-10-CM

## 2019-04-07 DIAGNOSIS — N189 Chronic kidney disease, unspecified: Secondary | ICD-10-CM | POA: Diagnosis present

## 2019-04-07 DIAGNOSIS — U071 COVID-19: Secondary | ICD-10-CM | POA: Diagnosis present

## 2019-04-07 DIAGNOSIS — Z823 Family history of stroke: Secondary | ICD-10-CM

## 2019-04-07 DIAGNOSIS — N039 Chronic nephritic syndrome with unspecified morphologic changes: Secondary | ICD-10-CM | POA: Diagnosis present

## 2019-04-07 DIAGNOSIS — G9341 Metabolic encephalopathy: Secondary | ICD-10-CM | POA: Diagnosis not present

## 2019-04-07 DIAGNOSIS — G2581 Restless legs syndrome: Secondary | ICD-10-CM | POA: Diagnosis present

## 2019-04-07 DIAGNOSIS — J810 Acute pulmonary edema: Secondary | ICD-10-CM | POA: Diagnosis present

## 2019-04-07 DIAGNOSIS — D696 Thrombocytopenia, unspecified: Secondary | ICD-10-CM | POA: Diagnosis not present

## 2019-04-07 DIAGNOSIS — R680 Hypothermia, not associated with low environmental temperature: Secondary | ICD-10-CM | POA: Diagnosis not present

## 2019-04-07 DIAGNOSIS — R58 Hemorrhage, not elsewhere classified: Secondary | ICD-10-CM

## 2019-04-07 DIAGNOSIS — R001 Bradycardia, unspecified: Secondary | ICD-10-CM | POA: Diagnosis not present

## 2019-04-07 DIAGNOSIS — I120 Hypertensive chronic kidney disease with stage 5 chronic kidney disease or end stage renal disease: Secondary | ICD-10-CM | POA: Diagnosis present

## 2019-04-07 DIAGNOSIS — N3000 Acute cystitis without hematuria: Secondary | ICD-10-CM | POA: Diagnosis present

## 2019-04-07 DIAGNOSIS — I63441 Cerebral infarction due to embolism of right cerebellar artery: Secondary | ICD-10-CM | POA: Diagnosis not present

## 2019-04-07 DIAGNOSIS — J439 Emphysema, unspecified: Secondary | ICD-10-CM | POA: Diagnosis present

## 2019-04-07 DIAGNOSIS — E8729 Other acidosis: Secondary | ICD-10-CM

## 2019-04-07 DIAGNOSIS — I248 Other forms of acute ischemic heart disease: Secondary | ICD-10-CM | POA: Diagnosis present

## 2019-04-07 DIAGNOSIS — E875 Hyperkalemia: Secondary | ICD-10-CM | POA: Diagnosis not present

## 2019-04-07 DIAGNOSIS — R609 Edema, unspecified: Secondary | ICD-10-CM

## 2019-04-07 DIAGNOSIS — Z66 Do not resuscitate: Secondary | ICD-10-CM | POA: Diagnosis not present

## 2019-04-07 DIAGNOSIS — A419 Sepsis, unspecified organism: Secondary | ICD-10-CM | POA: Diagnosis not present

## 2019-04-07 DIAGNOSIS — D631 Anemia in chronic kidney disease: Secondary | ICD-10-CM | POA: Diagnosis present

## 2019-04-07 DIAGNOSIS — F329 Major depressive disorder, single episode, unspecified: Secondary | ICD-10-CM | POA: Diagnosis present

## 2019-04-07 DIAGNOSIS — N049 Nephrotic syndrome with unspecified morphologic changes: Secondary | ICD-10-CM

## 2019-04-07 DIAGNOSIS — A4189 Other specified sepsis: Principal | ICD-10-CM | POA: Diagnosis present

## 2019-04-07 DIAGNOSIS — J9690 Respiratory failure, unspecified, unspecified whether with hypoxia or hypercapnia: Secondary | ICD-10-CM

## 2019-04-07 DIAGNOSIS — A4159 Other Gram-negative sepsis: Secondary | ICD-10-CM | POA: Diagnosis present

## 2019-04-07 DIAGNOSIS — E1121 Type 2 diabetes mellitus with diabetic nephropathy: Secondary | ICD-10-CM | POA: Diagnosis present

## 2019-04-07 DIAGNOSIS — R9389 Abnormal findings on diagnostic imaging of other specified body structures: Secondary | ICD-10-CM

## 2019-04-07 DIAGNOSIS — E785 Hyperlipidemia, unspecified: Secondary | ICD-10-CM | POA: Diagnosis present

## 2019-04-07 DIAGNOSIS — Z841 Family history of disorders of kidney and ureter: Secondary | ICD-10-CM

## 2019-04-07 DIAGNOSIS — R6520 Severe sepsis without septic shock: Secondary | ICD-10-CM | POA: Diagnosis not present

## 2019-04-07 DIAGNOSIS — E1122 Type 2 diabetes mellitus with diabetic chronic kidney disease: Secondary | ICD-10-CM | POA: Diagnosis present

## 2019-04-07 DIAGNOSIS — J1289 Other viral pneumonia: Secondary | ICD-10-CM | POA: Diagnosis present

## 2019-04-07 DIAGNOSIS — T380X5A Adverse effect of glucocorticoids and synthetic analogues, initial encounter: Secondary | ICD-10-CM | POA: Diagnosis not present

## 2019-04-07 DIAGNOSIS — E1165 Type 2 diabetes mellitus with hyperglycemia: Secondary | ICD-10-CM | POA: Diagnosis not present

## 2019-04-07 DIAGNOSIS — N179 Acute kidney failure, unspecified: Secondary | ICD-10-CM

## 2019-04-07 DIAGNOSIS — I878 Other specified disorders of veins: Secondary | ICD-10-CM | POA: Diagnosis present

## 2019-04-07 DIAGNOSIS — N186 End stage renal disease: Secondary | ICD-10-CM | POA: Diagnosis present

## 2019-04-07 DIAGNOSIS — E1141 Type 2 diabetes mellitus with diabetic mononeuropathy: Secondary | ICD-10-CM | POA: Diagnosis present

## 2019-04-07 DIAGNOSIS — K661 Hemoperitoneum: Secondary | ICD-10-CM | POA: Diagnosis not present

## 2019-04-07 DIAGNOSIS — D62 Acute posthemorrhagic anemia: Secondary | ICD-10-CM | POA: Diagnosis not present

## 2019-04-07 DIAGNOSIS — F840 Autistic disorder: Secondary | ICD-10-CM | POA: Diagnosis present

## 2019-04-07 DIAGNOSIS — I959 Hypotension, unspecified: Secondary | ICD-10-CM

## 2019-04-07 DIAGNOSIS — F419 Anxiety disorder, unspecified: Secondary | ICD-10-CM | POA: Diagnosis present

## 2019-04-07 DIAGNOSIS — F17210 Nicotine dependence, cigarettes, uncomplicated: Secondary | ICD-10-CM | POA: Diagnosis present

## 2019-04-07 DIAGNOSIS — R578 Other shock: Secondary | ICD-10-CM | POA: Diagnosis not present

## 2019-04-07 DIAGNOSIS — I4891 Unspecified atrial fibrillation: Secondary | ICD-10-CM | POA: Diagnosis not present

## 2019-04-07 DIAGNOSIS — Z888 Allergy status to other drugs, medicaments and biological substances status: Secondary | ICD-10-CM

## 2019-04-07 DIAGNOSIS — I6340 Cerebral infarction due to embolism of unspecified cerebral artery: Secondary | ICD-10-CM | POA: Insufficient documentation

## 2019-04-07 DIAGNOSIS — Z1621 Resistance to vancomycin: Secondary | ICD-10-CM | POA: Diagnosis present

## 2019-04-07 DIAGNOSIS — E1151 Type 2 diabetes mellitus with diabetic peripheral angiopathy without gangrene: Secondary | ICD-10-CM | POA: Diagnosis present

## 2019-04-07 DIAGNOSIS — Z6821 Body mass index (BMI) 21.0-21.9, adult: Secondary | ICD-10-CM

## 2019-04-07 DIAGNOSIS — Z4659 Encounter for fitting and adjustment of other gastrointestinal appliance and device: Secondary | ICD-10-CM

## 2019-04-07 DIAGNOSIS — G9340 Encephalopathy, unspecified: Secondary | ICD-10-CM | POA: Diagnosis not present

## 2019-04-07 HISTORY — DX: Nephrotic syndrome with unspecified morphologic changes: N04.9

## 2019-04-07 NOTE — ED Triage Notes (Signed)
Per pt she has been having bilateral leg swelling and pain  for 1 week week.  Pain also in her back. Seen pcp with no relief. Pt said causing her to fall and not able to walk very well.

## 2019-04-08 ENCOUNTER — Inpatient Hospital Stay (HOSPITAL_COMMUNITY): Payer: Medicaid Other

## 2019-04-08 ENCOUNTER — Emergency Department (HOSPITAL_COMMUNITY): Payer: Medicaid Other

## 2019-04-08 DIAGNOSIS — R58 Hemorrhage, not elsewhere classified: Secondary | ICD-10-CM | POA: Diagnosis not present

## 2019-04-08 DIAGNOSIS — I6349 Cerebral infarction due to embolism of other cerebral artery: Secondary | ICD-10-CM | POA: Diagnosis not present

## 2019-04-08 DIAGNOSIS — Z515 Encounter for palliative care: Secondary | ICD-10-CM | POA: Diagnosis not present

## 2019-04-08 DIAGNOSIS — E872 Acidosis: Secondary | ICD-10-CM | POA: Diagnosis not present

## 2019-04-08 DIAGNOSIS — R6520 Severe sepsis without septic shock: Secondary | ICD-10-CM | POA: Diagnosis not present

## 2019-04-08 DIAGNOSIS — N039 Chronic nephritic syndrome with unspecified morphologic changes: Secondary | ICD-10-CM | POA: Diagnosis present

## 2019-04-08 DIAGNOSIS — I6310 Cerebral infarction due to embolism of unspecified precerebral artery: Secondary | ICD-10-CM | POA: Diagnosis not present

## 2019-04-08 DIAGNOSIS — I639 Cerebral infarction, unspecified: Secondary | ICD-10-CM | POA: Diagnosis not present

## 2019-04-08 DIAGNOSIS — A4189 Other specified sepsis: Secondary | ICD-10-CM | POA: Diagnosis present

## 2019-04-08 DIAGNOSIS — K661 Hemoperitoneum: Secondary | ICD-10-CM | POA: Diagnosis not present

## 2019-04-08 DIAGNOSIS — I248 Other forms of acute ischemic heart disease: Secondary | ICD-10-CM | POA: Diagnosis present

## 2019-04-08 DIAGNOSIS — Z66 Do not resuscitate: Secondary | ICD-10-CM | POA: Diagnosis not present

## 2019-04-08 DIAGNOSIS — N3000 Acute cystitis without hematuria: Secondary | ICD-10-CM | POA: Diagnosis present

## 2019-04-08 DIAGNOSIS — G9340 Encephalopathy, unspecified: Secondary | ICD-10-CM | POA: Diagnosis not present

## 2019-04-08 DIAGNOSIS — J432 Centrilobular emphysema: Secondary | ICD-10-CM | POA: Diagnosis not present

## 2019-04-08 DIAGNOSIS — N170 Acute kidney failure with tubular necrosis: Secondary | ICD-10-CM | POA: Diagnosis present

## 2019-04-08 DIAGNOSIS — I120 Hypertensive chronic kidney disease with stage 5 chronic kidney disease or end stage renal disease: Secondary | ICD-10-CM | POA: Diagnosis present

## 2019-04-08 DIAGNOSIS — R0902 Hypoxemia: Secondary | ICD-10-CM | POA: Diagnosis not present

## 2019-04-08 DIAGNOSIS — Z992 Dependence on renal dialysis: Secondary | ICD-10-CM | POA: Diagnosis not present

## 2019-04-08 DIAGNOSIS — N179 Acute kidney failure, unspecified: Secondary | ICD-10-CM | POA: Diagnosis not present

## 2019-04-08 DIAGNOSIS — N032 Chronic nephritic syndrome with diffuse membranous glomerulonephritis: Secondary | ICD-10-CM | POA: Diagnosis not present

## 2019-04-08 DIAGNOSIS — Z1611 Resistance to penicillins: Secondary | ICD-10-CM | POA: Diagnosis present

## 2019-04-08 DIAGNOSIS — E1165 Type 2 diabetes mellitus with hyperglycemia: Secondary | ICD-10-CM | POA: Diagnosis not present

## 2019-04-08 DIAGNOSIS — G9341 Metabolic encephalopathy: Secondary | ICD-10-CM | POA: Diagnosis not present

## 2019-04-08 DIAGNOSIS — E1122 Type 2 diabetes mellitus with diabetic chronic kidney disease: Secondary | ICD-10-CM | POA: Diagnosis not present

## 2019-04-08 DIAGNOSIS — I611 Nontraumatic intracerebral hemorrhage in hemisphere, cortical: Secondary | ICD-10-CM | POA: Diagnosis not present

## 2019-04-08 DIAGNOSIS — I480 Paroxysmal atrial fibrillation: Secondary | ICD-10-CM | POA: Diagnosis not present

## 2019-04-08 DIAGNOSIS — J810 Acute pulmonary edema: Secondary | ICD-10-CM | POA: Diagnosis present

## 2019-04-08 DIAGNOSIS — N185 Chronic kidney disease, stage 5: Secondary | ICD-10-CM | POA: Diagnosis not present

## 2019-04-08 DIAGNOSIS — R64 Cachexia: Secondary | ICD-10-CM | POA: Diagnosis not present

## 2019-04-08 DIAGNOSIS — A419 Sepsis, unspecified organism: Secondary | ICD-10-CM | POA: Diagnosis not present

## 2019-04-08 DIAGNOSIS — J1289 Other viral pneumonia: Secondary | ICD-10-CM | POA: Diagnosis present

## 2019-04-08 DIAGNOSIS — Z1621 Resistance to vancomycin: Secondary | ICD-10-CM | POA: Diagnosis present

## 2019-04-08 DIAGNOSIS — I959 Hypotension, unspecified: Secondary | ICD-10-CM | POA: Diagnosis not present

## 2019-04-08 DIAGNOSIS — E1121 Type 2 diabetes mellitus with diabetic nephropathy: Secondary | ICD-10-CM | POA: Diagnosis not present

## 2019-04-08 DIAGNOSIS — R778 Other specified abnormalities of plasma proteins: Secondary | ICD-10-CM | POA: Diagnosis not present

## 2019-04-08 DIAGNOSIS — I63441 Cerebral infarction due to embolism of right cerebellar artery: Secondary | ICD-10-CM | POA: Diagnosis not present

## 2019-04-08 DIAGNOSIS — R609 Edema, unspecified: Secondary | ICD-10-CM | POA: Diagnosis not present

## 2019-04-08 DIAGNOSIS — N186 End stage renal disease: Secondary | ICD-10-CM | POA: Diagnosis present

## 2019-04-08 DIAGNOSIS — N189 Chronic kidney disease, unspecified: Secondary | ICD-10-CM | POA: Diagnosis not present

## 2019-04-08 DIAGNOSIS — R9389 Abnormal findings on diagnostic imaging of other specified body structures: Secondary | ICD-10-CM | POA: Diagnosis not present

## 2019-04-08 DIAGNOSIS — N269 Renal sclerosis, unspecified: Secondary | ICD-10-CM | POA: Diagnosis not present

## 2019-04-08 DIAGNOSIS — F840 Autistic disorder: Secondary | ICD-10-CM | POA: Diagnosis present

## 2019-04-08 DIAGNOSIS — U071 COVID-19: Secondary | ICD-10-CM | POA: Diagnosis present

## 2019-04-08 DIAGNOSIS — D62 Acute posthemorrhagic anemia: Secondary | ICD-10-CM | POA: Diagnosis not present

## 2019-04-08 DIAGNOSIS — I4891 Unspecified atrial fibrillation: Secondary | ICD-10-CM | POA: Diagnosis not present

## 2019-04-08 DIAGNOSIS — J9601 Acute respiratory failure with hypoxia: Secondary | ICD-10-CM | POA: Diagnosis not present

## 2019-04-08 LAB — BASIC METABOLIC PANEL
Anion gap: 12 (ref 5–15)
Anion gap: 13 (ref 5–15)
BUN: 62 mg/dL — ABNORMAL HIGH (ref 6–20)
BUN: 68 mg/dL — ABNORMAL HIGH (ref 6–20)
CO2: 11 mmol/L — ABNORMAL LOW (ref 22–32)
CO2: 11 mmol/L — ABNORMAL LOW (ref 22–32)
Calcium: 7.8 mg/dL — ABNORMAL LOW (ref 8.9–10.3)
Calcium: 8.2 mg/dL — ABNORMAL LOW (ref 8.9–10.3)
Chloride: 115 mmol/L — ABNORMAL HIGH (ref 98–111)
Chloride: 119 mmol/L — ABNORMAL HIGH (ref 98–111)
Creatinine, Ser: 6.9 mg/dL — ABNORMAL HIGH (ref 0.44–1.00)
Creatinine, Ser: 6.95 mg/dL — ABNORMAL HIGH (ref 0.44–1.00)
GFR calc Af Amer: 7 mL/min — ABNORMAL LOW (ref >60)
GFR calc Af Amer: 7 mL/min — ABNORMAL LOW (ref >60)
GFR calc non Af Amer: 6 mL/min — ABNORMAL LOW (ref >60)
GFR calc non Af Amer: 6 mL/min — ABNORMAL LOW (ref >60)
Glucose, Bld: 166 mg/dL — ABNORMAL HIGH (ref 70–99)
Glucose, Bld: 92 mg/dL (ref 70–99)
Potassium: 5.4 mmol/L — ABNORMAL HIGH (ref 3.5–5.1)
Potassium: 5.4 mmol/L — ABNORMAL HIGH (ref 3.5–5.1)
Sodium: 139 mmol/L (ref 135–145)
Sodium: 142 mmol/L (ref 135–145)

## 2019-04-08 LAB — CBC WITH DIFFERENTIAL/PLATELET
Abs Immature Granulocytes: 0.06 10*3/uL (ref 0.00–0.07)
Basophils Absolute: 0 10*3/uL (ref 0.0–0.1)
Basophils Relative: 0 %
Eosinophils Absolute: 0 10*3/uL (ref 0.0–0.5)
Eosinophils Relative: 0 %
HCT: 30.7 % — ABNORMAL LOW (ref 36.0–46.0)
Hemoglobin: 9.4 g/dL — ABNORMAL LOW (ref 12.0–15.0)
Immature Granulocytes: 1 %
Lymphocytes Relative: 9 %
Lymphs Abs: 1.1 10*3/uL (ref 0.7–4.0)
MCH: 26.7 pg (ref 26.0–34.0)
MCHC: 30.6 g/dL (ref 30.0–36.0)
MCV: 87.2 fL (ref 80.0–100.0)
Monocytes Absolute: 0.5 10*3/uL (ref 0.1–1.0)
Monocytes Relative: 4 %
Neutro Abs: 9.8 10*3/uL — ABNORMAL HIGH (ref 1.7–7.7)
Neutrophils Relative %: 86 %
Platelets: 177 10*3/uL (ref 150–400)
RBC: 3.52 MIL/uL — ABNORMAL LOW (ref 3.87–5.11)
RDW: 18.4 % — ABNORMAL HIGH (ref 11.5–15.5)
WBC: 11.4 10*3/uL — ABNORMAL HIGH (ref 4.0–10.5)
nRBC: 0 % (ref 0.0–0.2)

## 2019-04-08 LAB — CBG MONITORING, ED
Glucose-Capillary: 129 mg/dL — ABNORMAL HIGH (ref 70–99)
Glucose-Capillary: 86 mg/dL (ref 70–99)
Glucose-Capillary: 124 mg/dL — ABNORMAL HIGH (ref 70–99)
Glucose-Capillary: 167 mg/dL — ABNORMAL HIGH (ref 70–99)

## 2019-04-08 LAB — BRAIN NATRIURETIC PEPTIDE: B Natriuretic Peptide: 1163.8 pg/mL — ABNORMAL HIGH (ref 0.0–100.0)

## 2019-04-08 LAB — URINALYSIS, ROUTINE W REFLEX MICROSCOPIC
Bilirubin Urine: NEGATIVE
Glucose, UA: 50 mg/dL — AB
Ketones, ur: 5 mg/dL — AB
Nitrite: NEGATIVE
Protein, ur: >=300 mg/dL — AB
Specific Gravity, Urine: 1.02 (ref 1.005–1.030)
WBC, UA: >50 WBC/hpf — ABNORMAL HIGH (ref 0–5)
pH: 8 (ref 5.0–8.0)

## 2019-04-08 LAB — HIV ANTIBODY (ROUTINE TESTING W REFLEX): HIV Screen 4th Generation wRfx: NONREACTIVE

## 2019-04-08 LAB — CBC
HCT: 25.3 % — ABNORMAL LOW (ref 36.0–46.0)
Hemoglobin: 7.9 g/dL — ABNORMAL LOW (ref 12.0–15.0)
MCH: 27.1 pg (ref 26.0–34.0)
MCHC: 31.2 g/dL (ref 30.0–36.0)
MCV: 86.9 fL (ref 80.0–100.0)
Platelets: 160 10*3/uL (ref 150–400)
RBC: 2.91 MIL/uL — ABNORMAL LOW (ref 3.87–5.11)
RDW: 18.6 % — ABNORMAL HIGH (ref 11.5–15.5)
WBC: 10.3 10*3/uL (ref 4.0–10.5)
nRBC: 0.4 % — ABNORMAL HIGH (ref 0.0–0.2)

## 2019-04-08 LAB — C-REACTIVE PROTEIN: CRP: 12.3 mg/dL — ABNORMAL HIGH (ref <1.0)

## 2019-04-08 LAB — HEPATIC FUNCTION PANEL
ALT: 23 U/L (ref 0–44)
AST: 121 U/L — ABNORMAL HIGH (ref 15–41)
Albumin: 1.8 g/dL — ABNORMAL LOW (ref 3.5–5.0)
Alkaline Phosphatase: 61 U/L (ref 38–126)
Bilirubin, Direct: <0.1 mg/dL (ref 0.0–0.2)
Total Bilirubin: 0.9 mg/dL (ref 0.3–1.2)
Total Protein: 6.2 g/dL — ABNORMAL LOW (ref 6.5–8.1)

## 2019-04-08 LAB — IRON AND TIBC
Iron: 20 ug/dL — ABNORMAL LOW (ref 28–170)
Saturation Ratios: 18 % (ref 10.4–31.8)
TIBC: 111 ug/dL — ABNORMAL LOW (ref 250–450)
UIBC: 91 ug/dL

## 2019-04-08 LAB — LACTATE DEHYDROGENASE: LDH: 678 U/L — ABNORMAL HIGH (ref 98–192)

## 2019-04-08 LAB — HEMOGLOBIN A1C
Hgb A1c MFr Bld: 7.9 % — ABNORMAL HIGH (ref 4.8–5.6)
Mean Plasma Glucose: 180.03 mg/dL

## 2019-04-08 LAB — D-DIMER, QUANTITATIVE: D-Dimer, Quant: 8.75 ug/mL-FEU — ABNORMAL HIGH (ref 0.00–0.50)

## 2019-04-08 LAB — SARS CORONAVIRUS 2 (TAT 6-24 HRS): SARS Coronavirus 2: POSITIVE — AB

## 2019-04-08 LAB — LACTIC ACID, PLASMA: Lactic Acid, Venous: 1 mmol/L (ref 0.5–1.9)

## 2019-04-08 LAB — TROPONIN I (HIGH SENSITIVITY): Troponin I (High Sensitivity): 1990 ng/L (ref <18)

## 2019-04-08 LAB — FERRITIN: Ferritin: 2725 ng/mL — ABNORMAL HIGH (ref 11–307)

## 2019-04-08 LAB — PROCALCITONIN: Procalcitonin: 2.02 ng/mL

## 2019-04-08 MED ORDER — ACETAMINOPHEN 500 MG PO TABS
1000.0000 mg | ORAL_TABLET | Freq: Four times a day (QID) | ORAL | Status: DC | PRN
  Administered 2019-04-08 – 2019-04-09 (×3): 1000 mg via ORAL
  Filled 2019-04-08 (×3): qty 2

## 2019-04-08 MED ORDER — SODIUM CHLORIDE 0.9 % IV SOLN
200.0000 mg | Freq: Once | INTRAVENOUS | Status: AC
  Administered 2019-04-08: 200 mg via INTRAVENOUS
  Filled 2019-04-08: qty 40

## 2019-04-08 MED ORDER — HEPARIN SODIUM (PORCINE) 5000 UNIT/ML IJ SOLN
5000.0000 [IU] | Freq: Three times a day (TID) | INTRAMUSCULAR | Status: DC
  Administered 2019-04-08: 5000 [IU] via SUBCUTANEOUS
  Filled 2019-04-08: qty 1

## 2019-04-08 MED ORDER — FLUOXETINE HCL 20 MG PO CAPS
20.0000 mg | ORAL_CAPSULE | Freq: Every day | ORAL | Status: DC
  Administered 2019-04-09 – 2019-04-11 (×2): 20 mg via ORAL
  Filled 2019-04-08 (×6): qty 1

## 2019-04-08 MED ORDER — INSULIN ASPART 100 UNIT/ML ~~LOC~~ SOLN
0.0000 [IU] | Freq: Three times a day (TID) | SUBCUTANEOUS | Status: DC
  Administered 2019-04-08 (×2): 1 [IU] via SUBCUTANEOUS
  Administered 2019-04-09: 2 [IU] via SUBCUTANEOUS
  Administered 2019-04-10: 3 [IU] via SUBCUTANEOUS
  Administered 2019-04-10: 1 [IU] via SUBCUTANEOUS
  Administered 2019-04-10 – 2019-04-11 (×3): 2 [IU] via SUBCUTANEOUS
  Administered 2019-04-11 – 2019-04-12 (×2): 3 [IU] via SUBCUTANEOUS

## 2019-04-08 MED ORDER — FUROSEMIDE 10 MG/ML IJ SOLN
80.0000 mg | Freq: Once | INTRAMUSCULAR | Status: AC
  Administered 2019-04-08: 80 mg via INTRAVENOUS
  Filled 2019-04-08: qty 8

## 2019-04-08 MED ORDER — SODIUM ZIRCONIUM CYCLOSILICATE 10 G PO PACK
10.0000 g | PACK | Freq: Once | ORAL | Status: AC
  Administered 2019-04-08: 10 g via ORAL
  Filled 2019-04-08: qty 1

## 2019-04-08 MED ORDER — SODIUM CHLORIDE 0.9 % IV SOLN
100.0000 mg | Freq: Every day | INTRAVENOUS | Status: AC
  Administered 2019-04-09 – 2019-04-12 (×4): 100 mg via INTRAVENOUS
  Filled 2019-04-08 (×4): qty 20

## 2019-04-08 MED ORDER — SODIUM CHLORIDE 0.9 % IV SOLN
1.0000 g | INTRAVENOUS | Status: DC
  Administered 2019-04-09 – 2019-04-11 (×3): 1 g via INTRAVENOUS
  Filled 2019-04-08 (×3): qty 10

## 2019-04-08 MED ORDER — ACETAMINOPHEN 325 MG PO TABS
650.0000 mg | ORAL_TABLET | Freq: Once | ORAL | Status: AC
  Administered 2019-04-08: 06:00:00 650 mg via ORAL
  Filled 2019-04-08: qty 2

## 2019-04-08 MED ORDER — POLYETHYLENE GLYCOL 3350 17 G PO PACK: 17.0000 g | PACK | Freq: Every day | ORAL | Status: DC | PRN

## 2019-04-08 MED ORDER — SODIUM CHLORIDE 0.9% FLUSH
3.0000 mL | Freq: Two times a day (BID) | INTRAVENOUS | Status: DC
  Administered 2019-04-08 – 2019-04-18 (×19): 3 mL via INTRAVENOUS

## 2019-04-08 MED ORDER — SODIUM CHLORIDE 0.9 % IV SOLN
1.0000 g | Freq: Once | INTRAVENOUS | Status: AC
  Administered 2019-04-08: 06:00:00 1 g via INTRAVENOUS
  Filled 2019-04-08: qty 10

## 2019-04-08 MED ORDER — HEPARIN SODIUM (PORCINE) 5000 UNIT/ML IJ SOLN
7500.0000 [IU] | Freq: Three times a day (TID) | INTRAMUSCULAR | Status: DC
  Administered 2019-04-08 – 2019-04-10 (×5): 7500 [IU] via SUBCUTANEOUS
  Filled 2019-04-08 (×5): qty 2

## 2019-04-08 MED ORDER — SODIUM BICARBONATE 650 MG PO TABS
1300.0000 mg | ORAL_TABLET | Freq: Two times a day (BID) | ORAL | Status: DC
  Administered 2019-04-08 – 2019-04-09 (×3): 1300 mg via ORAL
  Filled 2019-04-08 (×3): qty 2

## 2019-04-08 MED ORDER — ONDANSETRON HCL 4 MG PO TABS
4.0000 mg | ORAL_TABLET | Freq: Every day | ORAL | Status: DC | PRN
  Administered 2019-04-11: 4 mg via ORAL
  Filled 2019-04-08 (×2): qty 1

## 2019-04-08 MED ORDER — SODIUM CHLORIDE 0.9 % IV SOLN
500.0000 mg | Freq: Once | INTRAVENOUS | Status: AC
  Administered 2019-04-08: 06:00:00 500 mg via INTRAVENOUS
  Filled 2019-04-08: qty 500

## 2019-04-08 MED ORDER — MORPHINE SULFATE (PF) 4 MG/ML IV SOLN
4.0000 mg | Freq: Once | INTRAVENOUS | Status: AC
  Administered 2019-04-08: 06:00:00 4 mg via INTRAVENOUS
  Filled 2019-04-08: qty 1

## 2019-04-08 MED ORDER — FUROSEMIDE 10 MG/ML IJ SOLN
80.0000 mg | Freq: Three times a day (TID) | INTRAMUSCULAR | Status: DC
  Administered 2019-04-08 – 2019-04-09 (×3): 80 mg via INTRAVENOUS
  Filled 2019-04-08 (×4): qty 8

## 2019-04-08 MED ORDER — DEXAMETHASONE SODIUM PHOSPHATE 10 MG/ML IJ SOLN
6.0000 mg | INTRAMUSCULAR | Status: DC
  Administered 2019-04-08 – 2019-04-14 (×7): 6 mg via INTRAVENOUS
  Filled 2019-04-08 (×7): qty 1

## 2019-04-08 MED ORDER — TRAMADOL HCL 50 MG PO TABS
50.0000 mg | ORAL_TABLET | Freq: Four times a day (QID) | ORAL | Status: DC | PRN
  Administered 2019-04-08 – 2019-04-11 (×6): 50 mg via ORAL
  Filled 2019-04-08 (×6): qty 1

## 2019-04-08 MED ORDER — AMLODIPINE BESYLATE 2.5 MG PO TABS: 2.5000 mg | ORAL_TABLET | Freq: Every evening | ORAL | Status: DC | PRN

## 2019-04-08 NOTE — ED Provider Notes (Signed)
[facility] EMERGENCY DEPARTMENT Provider Note   CSN: [id number] Arrival date & time: 04/10/2019  2349     History Chief Complaint  Patient presents with  . Leg Swelling  . Fall    Andrea Glenn is a 58 y.o. female.  Patient with history of CKD, nephrotic syndrome, HTN, frequent falls, DM, tremor, LE edema presents to ED with complaint of increased LE edema bilaterally over the last 2 days. She states today she had difficulty walking because her legs "were so heavy". She reports a fall where she lost her balance and fell against her couch impacting her back causing bilateral flank pain and soreness. No fever, vomiting, SOB, cough, congestion. She reports onset of chills tonight, "I can't stop shaking."   The history is provided by the patient. No language interpreter was used.  Fall Pertinent negatives include no chest pain, no abdominal pain and no shortness of breath.       Past Medical History:  Diagnosis Date  . CKD (chronic kidney disease)   . Diabetes mellitus without complication ([place])   . Dysuria 01/20/2017  . Edema of both legs   . Frequent falls 07/22/2017  . Grief 09/08/2017  . Hematuria 01/20/2017   Noted on UA's in June & Oct 2018, possibly related to UTI '[ ]'  recheck UA at future visit  . Hypertension   . Neuropathy   . Tremor   . Vomiting 11/22/2018    Patient Active Problem List   Diagnosis Date Noted  . Nausea & vomiting 11/22/2018  . Fall 11/22/2018  . Vomiting 11/22/2018  . DM (diabetes mellitus), type 2 with renal complications ([place]) 29/47/6546  . Essential hypertension 06/16/2018  . Nephrotic syndrome   . Symptomatic anemia 04/05/2018  . Leg swelling   . Diabetes due to underlying condition w diabetic nephropathy ([place])   . Solitary pulmonary nodule 10/10/2017  . Depression 09/26/2017  . Postherpetic neuralgia 09/08/2017  . CKD (chronic kidney disease) 07/08/2017  . Anemia of chronic disease 06/21/2017  . Primary hypertension  02/03/2017  . Lower extremity edema 12/30/2016  . Pancreatitis 10/08/2016  . Hyperlipidemia associated with type 2 diabetes mellitus ([place]) 10/26/2013  . Restless leg syndrome 03/03/2010  . Uncontrolled type 2 diabetes mellitus with peripheral neuropathy ([place]) 06/16/2006  . Tobacco use disorder 06/16/2006    Past Surgical History:  Procedure Laterality Date  . BACK SURGERY    . CESAREAN SECTION    . FOOT SURGERY    . SHOULDER SURGERY       OB History   No obstetric history on file.     Family History  Problem Relation Age of Onset  . Kidney failure Mother   . Diabetes Mother   . Thyroid disease Mother   . Hypertension Mother   . Heart failure Mother   . Kidney failure Father   . Cancer Sister   . Kidney failure Sister   . Stroke Brother   . Cancer Other   . Kidney failure Maternal Grandmother   . Kidney failure Maternal Grandfather   . Kidney failure Sister   . Heart attack Sister     Social History   Tobacco Use  . Smoking status: Current Every Day Smoker    Packs/day: 0.10    Years: 20.00    Pack years: 2.00    Types: Cigarettes    Start date: 47  . Smokeless tobacco: Never Used  . Tobacco comment: working on quitting - 2 per day -  2.26.19  Substance Use Topics  . Alcohol use: No  . Drug use: No    Home Medications Prior to Admission medications   Medication Sig Start Date End Date Taking? Authorizing Provider  acetaminophen (TYLENOL) 500 MG tablet Take 1,000 mg by mouth every 6 (six) hours as needed (for pain or headaches).     [provider]  amLODipine (NORVASC) 10 MG tablet Take 1 tablet (10 mg total) by mouth daily. 11/25/18   Wilber Oliphant, MD  ammonium lactate (AMLACTIN) 12 % lotion Apply 1 application topically daily. Apply to both feet daily 06/09/18   Marzetta Board, DPM  bisacodyl (DULCOLAX) 5 MG EC tablet Take 1 tablet (5 mg total) by mouth daily as needed for moderate constipation. 07/25/17   Doreatha Lew, MD  Blood  Glucose Monitoring Suppl (ACCU-CHEK AVIVA PLUS) w/Device KIT 1 each by Does not apply route 3 (three) times daily with meals. 01/03/19   Guadalupe Dawn, MD  Blood Pressure Monitoring (BLOOD PRESSURE CUFF) MISC 1 each by Does not apply route as needed. 01/03/19   Guadalupe Dawn, MD  capsaicin (ZOSTRIX) 0.025 % cream Apply topically 2 (two) times daily as needed (pain). 04/13/18   Diallo, Earna Coder, MD  Dulaglutide 1.5 MG/0.5ML SOPN Inject 1.5 mg into the skin once a week. 11/02/17   Zenia Resides, MD  FLUoxetine (PROZAC) 20 MG capsule Take 1 capsule (20 mg total) by mouth daily. 05/16/18   Rosa Sanchez Bing, DO  furosemide (LASIX) 80 MG tablet Take 1 tablet (80 mg total) by mouth 2 (two) times daily. 11/24/18   Wilber Oliphant, MD  hydrALAZINE (APRESOLINE) 25 MG tablet Take 1 tablet (25 mg total) by mouth 3 (three) times daily for 30 days. 06/17/18 11/23/18  Shelly Coss, MD  hydrOXYzine (ATARAX/VISTARIL) 25 MG tablet Take 1 tablet (25 mg total) by mouth 3 (three) times daily as needed. Patient taking differently: Take 25 mg by mouth 3 (three) times daily as needed for itching.  01/16/18   Sherene Sires, DO  levocetirizine (XYZAL) 5 MG tablet TAKE 1 TABLET BY MOUTH EVERY EVENING *NOT USE WITH BENADRYL** Patient taking differently: Take 5 mg by mouth every evening. DO NOT USE WITH BENADRYL 07/03/18   Gurley Bing, DO  Misc. Devices (WALKER) MISC 1 each by Does not apply route as needed. 01/03/19   Guadalupe Dawn, MD  nicotine (NICODERM CQ - DOSED IN MG/24 HR) 7 mg/24hr patch Place 1 patch (7 mg total) onto the skin daily. 01/03/19   Guadalupe Dawn, MD  ondansetron (ZOFRAN) 4 MG tablet Take 1 tablet (4 mg total) by mouth daily as needed for nausea or vomiting. 11/24/18 11/24/19  Stark Klein, MD  polyvinyl alcohol (LIQUIFILM TEARS) 1.4 % ophthalmic solution Place 2 drops into both eyes 4 (four) times daily. 09/12/17   Lovenia Kim, MD  tacrolimus (PROGRAF) 1 MG capsule Take 1-2 mg by mouth See admin  instructions. 85m in the am and 124mat pm 05/10/18   [provider]  triamcinolone cream (KENALOG) 0.1 % Apply 1 application topically 2 (two) times daily. 05/11/18   EnKinnie FeilMD    Allergies    Atorvastatin, Statins, Amitriptyline, Metformin and related, Adhesive [tape], and Gabapentin  Review of Systems   Review of Systems  Constitutional: Positive for activity change and chills. Negative for fever.  HENT: Negative.  Negative for congestion.   Respiratory: Negative.  Negative for cough and shortness of breath.   Cardiovascular: Positive for leg  swelling. Negative for chest pain.  Gastrointestinal: Negative.  Negative for abdominal pain.  Musculoskeletal:       LE pain  Skin: Negative.  Negative for color change and wound.  Neurological: Positive for weakness (generalized).    Physical Exam Updated Vital Signs BP (!) 148/109 (BP Location: Right Arm)   Pulse 95   Temp 100.3 F (37.9 C) (Oral)   Resp (!) 25   SpO2 95%   Physical Exam Vitals and nursing note reviewed.  Constitutional:      Comments: Patient is under several blankets currently c/o being cold.   HENT:     Head: Normocephalic.  Eyes:     General: No scleral icterus.    Comments: No conjunctival pallor.  Cardiovascular:     Rate and Rhythm: Normal rate and regular rhythm.     Heart sounds: No murmur.  Pulmonary:     Effort: Pulmonary effort is normal.     Breath sounds: No wheezing, rhonchi or rales.  Abdominal:     Palpations: Abdomen is soft.     Tenderness: There is no abdominal tenderness.  Musculoskeletal:     Cervical back: Normal range of motion and neck supple.     Right lower leg: Edema present.     Left lower leg: Edema present.     Comments: Marked pitting edema bilateral LE's extending to thighs. Generalized tenderness. No redness, warmth or lesion.  Skin:    General: Skin is warm and dry.  Neurological:     General: No focal deficit present.     Mental Status: She is  alert and oriented to person, place, and time.     ED Results / Procedures / Treatments   Labs (all labs ordered are listed, but only abnormal results are displayed) Labs Reviewed  CBC WITH DIFFERENTIAL/PLATELET - Abnormal; Notable for the following components:      Result Value   WBC 11.4 (*)    RBC 3.52 (*)    Hemoglobin 9.4 (*)    HCT 30.7 (*)    RDW 18.4 (*)    Neutro Abs 9.8 (*)    All other components within normal limits  BASIC METABOLIC PANEL - Abnormal; Notable for the following components:   Potassium 5.4 (*)    Chloride 115 (*)    CO2 11 (*)    Glucose, Bld 166 (*)    BUN 62 (*)    Creatinine, Ser 6.95 (*)    Calcium 8.2 (*)    GFR calc non Af Amer 6 (*)    GFR calc Af Amer 7 (*)    All other components within normal limits  URINE CULTURE  URINALYSIS, ROUTINE W REFLEX MICROSCOPIC  LACTIC ACID, PLASMA  LACTIC ACID, PLASMA    EKG None  Radiology No results found.  Procedures Procedures (including critical care time)  Medications Ordered in ED Medications - No data to display  ED Course  I have reviewed the triage vital signs and the nursing notes.  Pertinent labs & imaging results that were available during my care of the patient were reviewed by me and considered in my medical decision making (see chart for details).    MDM Rules/Calculators/A&P                      Patient to ED with complaints as detailed in HPI.  Low grade temperature noted. No tachycardia or hypoxia. Labs CXR pending.   On chart review, patient has a history  of FSGS (renal biopsy). Creatinine in June 2.55, August 4.95 and tonight is 9.95. History of noncompliance leading to stopping Prograf and giving once weekly injections, presumed to be rituximab, but patient does not know what the medication is.   Over time during ED encounter, the patient develops a temp of 101.8 (rectal), tachycardia, tachypnea. Lactate is 1.0. Tylenol given for fever. Blood and urine cultures added.  She is acidotic with CO2 11. Normal anion gap.  UA c/w infection. Appeared "white" per collecting technician, >50 WBC's, clumps WBCs, but rare bacteria. IV Rocephin provided.   CXR concerning for developing PNA, infectious vs atypical vs viral. Zithromax provided, however, COVID is considered despite that she denies cough, SOB, chest pain.   Discussed patient care with [facility] admitting resident who accepts for admission.  Final Clinical Impression(s) / ED Diagnoses Final diagnoses:  None   1. Sepsis 2. AKI 3. UTI 4. Abnormal chest x-ray 5. Peripheral edema  Rx / DC Orders ED Discharge Orders    None       Charlann Lange, PA-C 04/08/19 0546    Orpah Greek, MD 04/08/19 [phone]

## 2019-04-08 NOTE — ED Notes (Signed)
Dinner Tray Ordered @ 1842. 

## 2019-04-08 NOTE — Progress Notes (Signed)
Pharmacy Consult - Remdesivir  37 yof presenting COVID-19 positive with respiratory symptoms requiring hospitalization. Pharmacy consulted to dose Remdesivir. ALT<220.   Plan: Remdesivir 200mg  IV x 1; then 100mg  IV q24h to complete 5 total doses Monitor clinical progress, ALT   Elicia Lamp, PharmD, BCPS Please check AMION for all [place] contact numbers Clinical Pharmacist 04/08/2019 1:11 PM

## 2019-04-08 NOTE — H&P (Addendum)
[facility] Admission History and Physical Service Pager: [phone]  Patient name: Andrea Glenn Medical record number: [id number] Date of birth: 1961-04-19 Age: 58 y.o. Gender: female  Primary Care Provider: Guadalupe Dawn, MD Consultants: Nephology, wound care Code Status: Full  Chief Complaint: Leg pain and recent fall  Assessment and Plan: Andrea Glenn is a 58 y.o. female presenting with urosepsis. PMH is significant for focal segmental glomerulonephritis, CKD, DM, HTN, venous stasis.  Urosepsis She reports at least 1 day of urinary frequency, urgency in addition to general weakness and imbalance.  Vitals on admission were remarkable for fever up to 101.8, tachypneic to 30 breaths/min and tachycardic to 105 bpm.  She remains normotensive.  Physical exam was notable for significant rigors and fatigue.  She is significant nonpitting lower extremity edema and peripheral neuropathy of the feet with a thick callus in the bottom of her first MTP without the appearance of infection.  Labs remarkable for white blood count of 11.4, creatinine of 6.9 (baseline 4.0), bicarb 11, lactic acid 1.0, moderate leukocytes in her urine with rare bacteria on microscopy.  Chest x-ray is notable for bilateral pleural effusions with patchy opacities consistent with pulmonary edema and/or atypical pneumonia.  The differential at this time includes urosepsis, pneumonia, possible cellulitis (MTP callus), heart failure.  Low suspicion for heart failure based on lack of history of heart issues most consistent with infectious etiology at this time.  Most likely urosepsis, low suspicion for atypical pneumonia based on lack of respiratory symptoms.  Lower suspicion for a right foot cellulitis based on physical exam the her callus does warrant further assessment.  Will admit to the inpatient service and begin empiric antibiotics.  We have not administered fluids at this time to her current  renal impairment. -Admit MedSurg, attending Dr. Owens Shark -Consult wound care to assess right MCP callus -Continue ceftriaxone daily until she shows significant improvement -Discontinue azithromycin based on low suspicion for atypical pneumonia -Hold IV fluids for now -Follow-up urine culture, blood culture -No additional imaging at this time, consider renal ultrasound if no improvement in the next day or 2 -Tylenol as needed -Zofran as needed  AKI on CKD, Hx of FSGN Her medical history is significant for FSGN, hypertension, diabetes.  She follows outpatient with nephrology and has previously been treated with tacrolimus which has been discontinued and replaced more recently with rituximab infusions.  In the past year, her baseline creatinine has increased from 2 to 4 and she now is admitted to the hospital with a creatinine close to 7.  Currently, volume up on exam.  Not currently planning to administer additional fluids for sepsis as noted above.  Will have nephrology weigh in as far as appropriate management for volume status in addition to appropriate steps being taken for potential of fistula placement in the near future. -Nephrology consulted, appreciate recommendations -Hold off on fluids for now -Holding diuresis for now, awaiting nephrology recommendation  Hyperkalemia Potassium to 5.4 on admission.  Likely secondary to AKI.  No peak T waves, widened QRS complexes flattened T waves noted on EKG.  We will continue to monitor, may benefit from diuresis. -Follow-up nephrology recommendations  Volume overload Most likely related to her acute kidney failure and sepsis.  Low suspicion for cardiac etiology.  Her physical exam was remarkable for a 2/6 systolic murmur at the right sternal border in addition to lower extremity edema and rales on pulmonary auscultation.  Will order a BNP for assessment of possible  heart failure and will consider imaging based on BNP results. -Follow-up BNP, consider  echo  Lower extremity swelling with pain History and physical consistent with venous stasis.  This does seem to have worsened in the past week or so.  Worsening is most likely related to infection and worsening kidney injury. -Consider gabapentin for worsening pain control -Monitor lower extremity swelling  AST elevation Limited to 121 on admission.  Unclear etiology.  Possibly secondary to sepsis. -Repeat LFTs prior to discharge  Diabetes, type II with LE neuropathy Last A1c 5.9 (11/22/18), home medication includes dulaglutide once weekly. -SSI sensitive -Monitor CBGs  Anemia, normocytic  Hemoglobin 9.4 on admission decreased from 11.62 months ago. -Follow-up iron/ferritin  HTN Mildly hypertensive on admission with systolic pressures ranging from 148-176.  Home medication includes amlodipine 10 mg daily.  No concern for hypotension at this time. -Continue amlodipine 10 mg  MDD Currently taking fluoxetine. -Continue fluoxetine  Nicotine use disorder Current smoker.  No desire for nicotine patch at this time. -Order nicotine patch if desired  FEN/GI: general diet Prophylaxis: heparin  Disposition: 2 to 3 days of hospitalization anticipated prior to discharge.  History of Present Illness:  [name] is a 58 y.o. female presenting with urosepsis. PMH is significant for focal segmental glomerulonephritis, CKD, DM, HTN, venous stasis.  Andrea Glenn presents with roughly 1 day of weakness and imbalance of her lower extremities.  She reports that she began to feel very unsteady and weak yesterday when she stood from a seated position and felt that she was unable to keep her balance.  She was given her cane but she failed to remain upright and fell back on her left buttock/hip.  She is not currently complaining of any significant hip or back tenderness.  In addition to this weakness and imbalance, she is also noted that she has had some nausea for the past day with one episode of  NB/NB emesis.  She has also been urinating frequently, feels a strong urge to urinate as often as 20 times daily per her report.  She denies suprapubic tenderness, back pain, burning with urination.  She also notes mild epigastric discomfort.  She notes that sharp sensation in the upper middle of her stomach that primarily bothers her in the morning when she is lying down.  She has not noted any radiation to her arms, back, jaw.  There is no association with exertion.  She has not noted any association with eating.  One of her chief concerns is the bilateral lower leg pain which is a chronic issue but has worsened in the past week.  She reports a sharp/shooting pain from her knees toward her hips that worsens with most activities including walking.  Again, this is a chronic issue but it seems to gotten significantly worse in the past week.  She thinks that lower extremity swelling may have slightly worsened in the past week.  Her lower extremity pain has been helped in the past with oxycodone.  She denies any known history of heart problems.  She denies any fever at home, shortness of breath, changes in bowel movements, chest pain with exertion.  In the ED, she was found to be septic with fever, tachycardia, tachypnea, elevated WBC and likely UTI.  Blood cultures and urine cultures were drawn and she was started on ceftriaxone and azithromycin.  Review Of Systems: Per HPI with the following additions:   Review of Systems  Constitutional: Positive for chills, diaphoresis and fever.  HENT: Negative for congestion, hearing loss and sore throat.   Eyes: Negative for blurred vision.  Respiratory: Positive for cough (chronic). Negative for shortness of breath.   Cardiovascular: Positive for leg swelling. Negative for chest pain and palpitations.  Gastrointestinal: Positive for vomiting (nb/nb x1). Negative for abdominal pain, blood in stool, constipation, diarrhea and melena.  Musculoskeletal: Positive  for falls. Negative for back pain and myalgias.  Skin: Negative for itching and rash.  Neurological: Positive for weakness. Negative for dizziness and headaches.  Endo/Heme/Allergies: Does not bruise/bleed easily.    Patient Active Problem List   Diagnosis Date Noted  . Sepsis ([place]) 04/08/2019  . Abnormal chest x-ray   . Acute cystitis without hematuria   . Nausea & vomiting 11/22/2018  . Fall 11/22/2018  . Vomiting 11/22/2018  . DM (diabetes mellitus), type 2 with renal complications ([place]) 06/00/4599  . Essential hypertension 06/16/2018  . Nephrotic syndrome   . Symptomatic anemia 04/05/2018  . Leg swelling   . Diabetes due to underlying condition w diabetic nephropathy ([place])   . Solitary pulmonary nodule 10/10/2017  . Depression 09/26/2017  . Postherpetic neuralgia 09/08/2017  . CKD (chronic kidney disease) 07/08/2017  . Anemia of chronic disease 06/21/2017  . Primary hypertension 02/03/2017  . Lower extremity edema 12/30/2016  . Pancreatitis 10/08/2016  . Hyperlipidemia associated with type 2 diabetes mellitus ([place]) 10/26/2013  . Restless leg syndrome 03/03/2010  . Uncontrolled type 2 diabetes mellitus with peripheral neuropathy ([place]) 06/16/2006  . Tobacco use disorder 06/16/2006    Past Medical History: Past Medical History:  Diagnosis Date  . CKD (chronic kidney disease)   . Diabetes mellitus without complication ([place])   . Dysuria 01/20/2017  . Edema of both legs   . Frequent falls 07/22/2017  . Grief 09/08/2017  . Hematuria 01/20/2017   Noted on UA's in June & Oct 2018, possibly related to UTI _0  recheck UA at future visit  . Hypertension   . Neuropathy   . Tremor   . Vomiting 11/22/2018    Past Surgical History: Past Surgical History:  Procedure Laterality Date  . BACK SURGERY    . CESAREAN SECTION    . FOOT SURGERY    . SHOULDER SURGERY      Social History: Social History   Tobacco Use  . Smoking status: Current Every Day Smoker    Packs/day: 0.10     Years: 20.00    Pack years: 2.00    Types: Cigarettes    Start date: 65  . Smokeless tobacco: Never Used  . Tobacco comment: working on quitting - 2 per day - 2.26.19  Substance Use Topics  . Alcohol use: No  . Drug use: No   Additional social history: smoker  Please also refer to relevant sections of EMR.  Family History: Family History  Problem Relation Age of Onset  . Kidney failure Mother   . Diabetes Mother   . Thyroid disease Mother   . Hypertension Mother   . Heart failure Mother   . Kidney failure Father   . Cancer Sister   . Kidney failure Sister   . Stroke Brother   . Cancer Other   . Kidney failure Maternal Grandmother   . Kidney failure Maternal Grandfather   . Kidney failure Sister   . Heart attack Sister     Allergies and Medications: Allergies  Allergen Reactions  . Atorvastatin Other (See Comments)    Suicidal thoughts  . Statins Other (See Comments)  This class of meds causes "suicidal thoughts"  . Amitriptyline Other (See Comments)    Depression  . Metformin And Related Diarrhea  . Adhesive [Tape] Other (See Comments)    "Sometimes peels off my skin"  . Gabapentin Other (See Comments)    "Gives me the shakes"  . Latex Rash   No current facility-administered medications on file prior to encounter.   Current Outpatient Medications on File Prior to Encounter  Medication Sig Dispense Refill  . acetaminophen (TYLENOL) 500 MG tablet Take 1,000 mg by mouth every 6 (six) hours as needed (for pain or headaches).     Marland Kitchen amLODipine (NORVASC) 2.5 MG tablet Take 2.5 mg by mouth at bedtime as needed (for high B/P).     Marland Kitchen ammonium lactate (AMLACTIN) 12 % lotion Apply 1 application topically daily. Apply to both feet daily (Patient taking differently: Apply 1 application topically See admin instructions. Apply to both feet daily) 400 g 0  . capsaicin (ZOSTRIX) 0.025 % cream Apply topically 2 (two) times daily as needed (pain). 60 g 0  . Dulaglutide 1.5  MG/0.5ML SOPN Inject 1.5 mg into the skin once a week. (Patient taking differently: Inject 1.5 mg into the skin every Tuesday. ) 4 pen 11  . FLUoxetine (PROZAC) 20 MG capsule Take 1 capsule (20 mg total) by mouth daily. 90 capsule 3  . ondansetron (ZOFRAN) 4 MG tablet Take 1 tablet (4 mg total) by mouth daily as needed for nausea or vomiting. 30 tablet 0  . tacrolimus (PROGRAF) 1 MG capsule Take 1-2 mg by mouth See admin instructions. Take 2 mg by mouth in the morning and 1 mg at bedtime    . amLODipine (NORVASC) 10 MG tablet Take 1 tablet (10 mg total) by mouth daily. (Patient not taking: Reported on 04/08/2019) 30 tablet 0  . bisacodyl (DULCOLAX) 5 MG EC tablet Take 1 tablet (5 mg total) by mouth daily as needed for moderate constipation. (Patient not taking: Reported on 04/08/2019) 30 tablet 0  . Blood Glucose Monitoring Suppl (ACCU-CHEK AVIVA PLUS) w/Device KIT 1 each by Does not apply route 3 (three) times daily with meals. 1 kit 0  . Blood Pressure Monitoring (BLOOD PRESSURE CUFF) MISC 1 each by Does not apply route as needed. 1 each 0  . furosemide (LASIX) 40 MG tablet Take 80 mg by mouth 2 (two) times daily. FOR 5 DAYS    . furosemide (LASIX) 80 MG tablet Take 1 tablet (80 mg total) by mouth 2 (two) times daily. (Patient not taking: Reported on 04/08/2019) 60 tablet 0  . hydrALAZINE (APRESOLINE) 25 MG tablet Take 1 tablet (25 mg total) by mouth 3 (three) times daily for 30 days. (Patient not taking: Reported on 04/08/2019) 90 tablet 0  . hydrOXYzine (ATARAX/VISTARIL) 25 MG tablet Take 1 tablet (25 mg total) by mouth 3 (three) times daily as needed. (Patient not taking: Reported on 04/08/2019) 60 tablet 0  . levocetirizine (XYZAL) 5 MG tablet TAKE 1 TABLET BY MOUTH EVERY EVENING *NOT USE WITH BENADRYL** (Patient not taking: No sig reported) 30 tablet 0  . Misc. Devices (WALKER) MISC 1 each by Does not apply route as needed. 1 each 0  . nicotine (NICODERM CQ - DOSED IN MG/24 HR) 7 mg/24hr patch  Place 1 patch (7 mg total) onto the skin daily. (Patient not taking: Reported on 04/08/2019) 28 patch 0  . polyvinyl alcohol (LIQUIFILM TEARS) 1.4 % ophthalmic solution Place 2 drops into both eyes 4 (four) times daily. (  Patient not taking: Reported on 04/08/2019) 15 mL 0  . triamcinolone cream (KENALOG) 0.1 % Apply 1 application topically 2 (two) times daily. (Patient not taking: Reported on 04/08/2019) 453.6 g 0    Objective: BP (!) 143/66   Pulse 80   Temp (!) 101.8 F (38.8 C) (Rectal)   Resp 17   Ht _0  (1.626 m)   Wt 64.4 kg   SpO2 91%   BMI 24.37 kg/m  Physical Exam Constitutional:      Appearance: She is normal weight. She is ill-appearing, toxic-appearing and diaphoretic.     Comments: Awake and interactive although significantly fatigued.  Slow to answer questions and quick to showed her eyes to try to sleep.  HENT:     Head: Normocephalic.     Nose: Nose normal.     Mouth/Throat:     Mouth: Mucous membranes are moist.  Eyes:     Extraocular Movements: Extraocular movements intact.     Conjunctiva/sclera: Conjunctivae normal.     Pupils: Pupils are equal, round, and reactive to light.     Comments: Contacts with abnormal iris coloring.  Cardiovascular:     Rate and Rhythm: Regular rhythm. Tachycardia present.     Pulses: Normal pulses.     Heart sounds: Murmur (2/6 crescendo murmur at right sterna border) present. No friction rub. No gallop.   Pulmonary:     Effort: Pulmonary effort is normal.     Breath sounds: Wheezing and rales present.  Chest:     Chest wall: No tenderness.  Abdominal:     General: Abdomen is flat. Bowel sounds are normal.     Palpations: Abdomen is soft.     Tenderness: There is abdominal tenderness (mild reproducable tenderness with palpation of the epigrastrum). There is no right CVA tenderness, left CVA tenderness or rebound.  Musculoskeletal:        General: Signs of injury (callus of right MTP joint) present.     Cervical back: No  rigidity.     Right lower leg: Edema present.     Left lower leg: Edema present.  Lymphadenopathy:     Cervical: No cervical adenopathy.  Skin:    Capillary Refill: Capillary refill takes less than 2 seconds.  Neurological:     General: No focal deficit present.     Cranial Nerves: No cranial nerve deficit.  Psychiatric:        Mood and Affect: Mood normal.        Behavior: Behavior normal.      Labs and Imaging: CBC BMET  Recent Labs  Lab 03/21/2019 2359  WBC 11.4*  HGB 9.4*  HCT 30.7*  PLT 177   Recent Labs  Lab 03/23/2019 2359  NA 139  K 5.4*  CL 115*  CO2 11*  BUN 62*  CREATININE 6.95*  GLUCOSE 166*  CALCIUM 8.2*     ECG: NSR, no abnormalities  DG Chest Portable 1 View  Result Date: 04/08/2019 CLINICAL DATA:  Edema. Patient reports fever and body aches. Bilateral leg swelling. EXAM: PORTABLE CHEST 1 VIEW COMPARISON:  Chest radiograph 07/12/2017 FINDINGS: Borderline cardiomegaly, increased heart size from prior exam. There are small bilateral pleural effusions. Patchy and interstitial opacities within both lungs. No pneumothorax. Bones are under mineralized. No acute osseous abnormalities are seen. IMPRESSION: 1. Borderline cardiomegaly with bilateral pleural effusions. 2. Patchy and interstitial opacities, suggesting combination of pulmonary edema and pneumonia, including atypical viral infection. Electronically Signed   By: Aurther Loft.D.  On: 04/08/2019 03:23     Matilde Haymaker, MD 04/08/2019, 10:14 AM PGY-2, [facility] pager: [phone], text pages welcome

## 2019-04-08 NOTE — Progress Notes (Signed)
PHARMACY - PHYSICIAN COMMUNICATION CRITICAL VALUE ALERT - BLOOD CULTURE IDENTIFICATION (BCID)  Andrea Glenn is an 58 y.o. female who presented to [facility] on [facility]/09/2019 with a chief complaint of covid-19 pna.   Name of physician (or Provider) Contacted: FMTS  Current antibiotics: CTX 2g IV q24h  Changes to prescribed antibiotics recommended:  Patient is on recommended antibiotics - No changes needed  Micro: gram positive rods in 1o4 blood cultures    Arrie Senate, PharmD, BCPS Clinical Pharmacist [phone] Please check AMION for all [place] numbers 04/08/2019

## 2019-04-08 NOTE — ED Notes (Signed)
Dr. Caron Presume aware of patients troponin of 1,990. Will continue to monitor

## 2019-04-08 NOTE — ED Notes (Signed)
Patient CBG was 86.

## 2019-04-08 NOTE — ED Notes (Signed)
Lunch Tray Ordered@ 1148. 

## 2019-04-08 NOTE — Consult Note (Signed)
WOC Nurse Consult Note: Patient receiving care in [facility] ED 39.  Consult completed remotely after review of record, including image of callus to right plantar first metatarsal heal. Reason for Consult: Right foot callus, venous stasis Wound type: DM callus Pressure Injury POA: Yes/No/NA Measurement: Wound bed: thick, dry brown callus Drainage (amount, consistency, odor) none observed in photo Periwound: dry, edematous, flaking Dressing procedure/placement/frequency:  Wash BLE and feet with soap and water.  Apply Sween Moisturizing ointment (pink and white tube in clean utility) to dry skin areas.  Beginning just behind the toes and going to just below the knees, spiral wrap kerlex, then do the same with 4 inch Ace Wraps.  Change daily. Monitor the wound area(s) for worsening of condition such as: Signs/symptoms of infection,  Increase in size,  Development of or worsening of odor, Development of pain, or increased pain at the affected locations.  Notify the medical team if any of these develop.  Thank you for the consult. [place] nurse will not follow at this time.  Please re-consult the [place] team if needed.  Val Riles, RN, MSN, CWOCN, CNS-BC, pager [phone]

## 2019-04-08 NOTE — Consult Note (Signed)
[facility] Nephrology Consultation Note  Requesting MD: Dorris Singh Reason for consult: AKI on CKD and fluid overload.  HPI:  Andrea Glenn is a 58 y.o. female with history of poorly controlled DM, HTN, HLD, nephrotic syndrome with biopsy-proven FSGS and diabetic nephropathy, CKD stage IV with creatinine level somewhere between 3-4 at baseline, presented with worsening leg edema, Covid 19+, seen as a consultation at the request of Dr. Owens Shark for AKI on CKD.  She follows at [facility], Dr. Moshe Cipro for her FSGS and CKD.  She was initially treated with Prograf without any improvement and later treated with 2 doses of rituximab for refractory nephrotic syndrome.  The 2 doses of rituximab was given in 01/2019.  It seems like she has no respond with immunosuppression and with progressive disease.  In the ER, she was hypertensive, febrile to 101.8, respiratory distress requiring oxygen.  The lab consistent with potassium 5.4, CO2 11, creatinine level 6.95, elevated BNP.  UA with UTI therefore admitted for possible urosepsis and COVID-19 infection.  Received dose of Lasix 80 mg IV.  At home, patient takes Lasix twice a day.  She does not remember the doses.  She has been receiving Epogen at short stay for her anemia.  Patient reports chills, short of breath, cough, worsening generalized body swelling.  She denied dysuria, urgency however reports frequent urination.  No pelvic pain.     PMHx:   Past Medical History:  Diagnosis Date  . CKD (chronic kidney disease)   . Diabetes mellitus without complication ([place])   . Dysuria 01/20/2017  . Edema of both legs   . Frequent falls 07/22/2017  . Grief 09/08/2017  . Hematuria 01/20/2017   Noted on UA's in June & Oct 2018, possibly related to UTI '[ ]'  recheck UA at future visit  . Hypertension   . Neuropathy   . Tremor   . Vomiting 11/22/2018    Past Surgical History:  Procedure Laterality Date  . BACK SURGERY     . CESAREAN SECTION    . FOOT SURGERY    . SHOULDER SURGERY      Family Hx:  Family History  Problem Relation Age of Onset  . Kidney failure Mother   . Diabetes Mother   . Thyroid disease Mother   . Hypertension Mother   . Heart failure Mother   . Kidney failure Father   . Cancer Sister   . Kidney failure Sister   . Stroke Brother   . Cancer Other   . Kidney failure Maternal Grandmother   . Kidney failure Maternal Grandfather   . Kidney failure Sister   . Heart attack Sister     Social History:  reports that she has been smoking cigarettes. She started smoking about 41 years ago. She has a 2.00 pack-year smoking history. She has never used smokeless tobacco. She reports that she does not drink alcohol or use drugs.  Allergies:  Allergies  Allergen Reactions  . Atorvastatin Other (See Comments)    Suicidal thoughts  . Statins Other (See Comments)    This class of meds causes "suicidal thoughts"  . Amitriptyline Other (See Comments)    Depression  . Metformin And Related Diarrhea  . Adhesive [Tape] Other (See Comments)    "Sometimes peels off my skin"  . Gabapentin Other (See Comments)    "Gives me the shakes"  . Latex Rash    Medications: Prior to Admission medications   Medication Sig Start Date End  Date Taking? Authorizing Provider  acetaminophen (TYLENOL) 500 MG tablet Take 1,000 mg by mouth every 6 (six) hours as needed (for pain or headaches).    Yes [provider]  amLODipine (NORVASC) 2.5 MG tablet Take 2.5 mg by mouth at bedtime as needed (for high B/P).  03/23/19  Yes [provider]  ammonium lactate (AMLACTIN) 12 % lotion Apply 1 application topically daily. Apply to both feet daily Patient taking differently: Apply 1 application topically See admin instructions. Apply to both feet daily 06/09/18  Yes Galaway, Stephani Police, DPM  capsaicin (ZOSTRIX) 0.025 % cream Apply topically 2 (two) times daily as needed (pain). 04/13/18  Yes Diallo,  Abdoulaye, MD  Dulaglutide 1.5 MG/0.5ML SOPN Inject 1.5 mg into the skin once a week. Patient taking differently: Inject 1.5 mg into the skin every Tuesday.  11/02/17  Yes Hensel, Jamal Collin, MD  FLUoxetine (PROZAC) 20 MG capsule Take 1 capsule (20 mg total) by mouth daily. 05/16/18  Yes Exton Bing, DO  ondansetron (ZOFRAN) 4 MG tablet Take 1 tablet (4 mg total) by mouth daily as needed for nausea or vomiting. 11/24/18 11/24/19 Yes Stark Klein, MD  tacrolimus (PROGRAF) 1 MG capsule Take 1-2 mg by mouth See admin instructions. Take 2 mg by mouth in the morning and 1 mg at bedtime 05/10/18  Yes [provider]  amLODipine (NORVASC) 10 MG tablet Take 1 tablet (10 mg total) by mouth daily. Patient not taking: Reported on 04/08/2019 11/25/18   Wilber Oliphant, MD  bisacodyl (DULCOLAX) 5 MG EC tablet Take 1 tablet (5 mg total) by mouth daily as needed for moderate constipation. Patient not taking: Reported on 04/08/2019 07/25/17   Patrecia Pour, Christean Grief, MD  Blood Glucose Monitoring Suppl (ACCU-CHEK AVIVA PLUS) w/Device KIT 1 each by Does not apply route 3 (three) times daily with meals. 01/03/19   Guadalupe Dawn, MD  Blood Pressure Monitoring (BLOOD PRESSURE CUFF) MISC 1 each by Does not apply route as needed. 01/03/19   Guadalupe Dawn, MD  furosemide (LASIX) 40 MG tablet Take 80 mg by mouth 2 (two) times daily. FOR 5 DAYS 02/23/19   [provider]  furosemide (LASIX) 80 MG tablet Take 1 tablet (80 mg total) by mouth 2 (two) times daily. Patient not taking: Reported on 04/08/2019 11/24/18   Wilber Oliphant, MD  hydrALAZINE (APRESOLINE) 25 MG tablet Take 1 tablet (25 mg total) by mouth 3 (three) times daily for 30 days. Patient not taking: Reported on 04/08/2019 06/17/18 04/08/19  Shelly Coss, MD  hydrOXYzine (ATARAX/VISTARIL) 25 MG tablet Take 1 tablet (25 mg total) by mouth 3 (three) times daily as needed. Patient not taking: Reported on 04/08/2019 01/16/18   Sherene Sires, DO   levocetirizine (XYZAL) 5 MG tablet TAKE 1 TABLET BY MOUTH EVERY EVENING *NOT USE WITH BENADRYL** Patient not taking: No sig reported 07/03/18   Luckey Bing, DO  Misc. Devices (WALKER) MISC 1 each by Does not apply route as needed. 01/03/19   Guadalupe Dawn, MD  nicotine (NICODERM CQ - DOSED IN MG/24 HR) 7 mg/24hr patch Place 1 patch (7 mg total) onto the skin daily. Patient not taking: Reported on 04/08/2019 01/03/19   Guadalupe Dawn, MD  polyvinyl alcohol (LIQUIFILM TEARS) 1.4 % ophthalmic solution Place 2 drops into both eyes 4 (four) times daily. Patient not taking: Reported on 04/08/2019 09/12/17   Lovenia Kim, MD  triamcinolone cream (KENALOG) 0.1 % Apply 1 application topically 2 (two) times daily. Patient not taking:  Reported on 04/08/2019 05/11/18   Kinnie Feil, MD    I have reviewed the patient's current medications.  Labs:  Results for orders placed or performed during the hospital encounter of 04/04/2019 (from the past 48 hour(s))  CBC with Differential     Status: Abnormal   Collection Time: 04/09/2019 11:59 PM  Result Value Ref Range   WBC 11.4 (H) 4.0 - 10.5 K/uL   RBC 3.52 (L) 3.87 - 5.11 MIL/uL   Hemoglobin 9.4 (L) 12.0 - 15.0 g/dL   HCT 30.7 (L) 36.0 - 46.0 %   MCV 87.2 80.0 - 100.0 fL   MCH 26.7 26.0 - 34.0 pg   MCHC 30.6 30.0 - 36.0 g/dL   RDW 18.4 (H) 11.5 - 15.5 %   Platelets 177 150 - 400 K/uL   nRBC 0.0 0.0 - 0.2 %   Neutrophils Relative % 86 %   Neutro Abs 9.8 (H) 1.7 - 7.7 K/uL   Lymphocytes Relative 9 %   Lymphs Abs 1.1 0.7 - 4.0 K/uL   Monocytes Relative 4 %   Monocytes Absolute 0.5 0.1 - 1.0 K/uL   Eosinophils Relative 0 %   Eosinophils Absolute 0.0 0.0 - 0.5 K/uL   Basophils Relative 0 %   Basophils Absolute 0.0 0.0 - 0.1 K/uL   Immature Granulocytes 1 %   Abs Immature Granulocytes 0.06 0.00 - 0.07 K/uL    Comment: Performed at [facility], [address], [place], [place] [address]  Basic metabolic panel     Status: Abnormal    Collection Time: 04/14/2019 11:59 PM  Result Value Ref Range   Sodium 139 135 - 145 mmol/L   Potassium 5.4 (H) 3.5 - 5.1 mmol/L   Chloride 115 (H) 98 - 111 mmol/L   CO2 11 (L) 22 - 32 mmol/L   Glucose, Bld 166 (H) 70 - 99 mg/dL   BUN 62 (H) 6 - 20 mg/dL   Creatinine, Ser 6.95 (H) 0.44 - 1.00 mg/dL   Calcium 8.2 (L) 8.9 - 10.3 mg/dL   GFR calc non Af Amer 6 (L) >60 mL/min   GFR calc Af Amer 7 (L) >60 mL/min   Anion gap 13 5 - 15    Comment: Performed at [facility] [address], [place], [place] [address]  Urinalysis, Routine w reflex microscopic     Status: Abnormal   Collection Time: 04/08/19  3:50 AM  Result Value Ref Range   Color, Urine AMBER (A) YELLOW    Comment: BIOCHEMICALS MAY BE AFFECTED BY COLOR   APPearance CLOUDY (A) CLEAR   Specific Gravity, Urine 1.020 1.005 - 1.030   pH 8.0 5.0 - 8.0   Glucose, UA 50 (A) NEGATIVE mg/dL   Hgb urine dipstick SMALL (A) NEGATIVE   Bilirubin Urine NEGATIVE NEGATIVE   Ketones, ur 5 (A) NEGATIVE mg/dL   Protein, ur >=300 (A) NEGATIVE mg/dL   Nitrite NEGATIVE NEGATIVE   Leukocytes,Ua MODERATE (A) NEGATIVE   RBC / HPF 6-10 0 - 5 RBC/hpf   WBC, UA >50 (H) 0 - 5 WBC/hpf   Bacteria, UA RARE (A) NONE SEEN   WBC Clumps PRESENT    Mucus PRESENT     Comment: Performed at [facility], [address], [place], [place] [address]  Lactic acid, plasma     Status: None   Collection Time: 04/08/19  4:22 AM  Result Value Ref Range   Lactic Acid, Venous 1.0 0.5 - 1.9  mmol/L    Comment: Performed at [facility], [place] [address] [place], [place] [address]  SARS CORONAVIRUS 2 (TAT 6-24 HRS) Nasopharyngeal Nasopharyngeal Swab     Status: Abnormal   Collection Time: 04/08/19  4:22 AM   Specimen: Nasopharyngeal Swab  Result Value Ref Range   SARS Coronavirus 2 POSITIVE (A) NEGATIVE    Comment: RESULT CALLED TO, READ BACK BY AND VERIFIED WITH: SHYNIECE WILSON RN.'[email or web address]'  ON 12.20.2020 BY TCALDWELL MT. (NOTE) SARS-CoV-2 target  nucleic acids are DETECTED. The SARS-CoV-2 RNA is generally detectable in upper and lower respiratory specimens during the acute phase of infection. Positive results are indicative of the presence of SARS-CoV-2 RNA. Clinical correlation with patient history and other diagnostic information is  necessary to determine patient infection status. Positive results do not rule out bacterial infection or co-infection with other viruses.  The expected result is Negative. Fact Sheet for Patients: SugarRoll.be Fact Sheet for Healthcare Providers: https://www.woods-mathews.com/ This test is not yet approved or cleared by the [place] FDA and  has been authorized for detection and/or diagnosis of SARS-CoV-2 by FDA under an Emergency Use Authorization (EUA). This EUA will remain  in effect (meaning this test  can be used) for the duration of the COVID-19 declaration under Section 564(b)(1) of the Act, 21 U.S.C. section 360bbb-3(b)(1), unless the authorization is terminated or revoked sooner. Performed at [facility], [place] [address], [place], [place] [address]   Hepatic function panel     Status: Abnormal   Collection Time: 04/08/19  4:22 AM  Result Value Ref Range   Total Protein 6.2 (L) 6.5 - 8.1 g/dL   Albumin 1.8 (L) 3.5 - 5.0 g/dL   AST 121 (H) 15 - 41 U/L   ALT 23 0 - 44 U/L   Alkaline Phosphatase 61 38 - 126 U/L   Total Bilirubin 0.9 0.3 - 1.2 mg/dL   Bilirubin, Direct <0.1 0.0 - 0.2 mg/dL   Indirect Bilirubin NOT CALCULATED 0.3 - 0.9 mg/dL    Comment: Performed at [facility] [address], [place], [place] [address]  HIV Antibody (routine testing w rflx)     Status: None   Collection Time: 04/08/19  4:22 AM  Result Value Ref Range   HIV Screen 4th Generation wRfx NON REACTIVE NON REACTIVE    Comment: Performed at [facility] [address] [place], [place] [address]  Brain natriuretic peptide     Status: Abnormal    Collection Time: 04/08/19  4:22 AM  Result Value Ref Range   B Natriuretic Peptide 1,163.8 (H) 0.0 - 100.0 pg/mL    Comment: Performed at [facility] [address] [place], [place] [address]  Hemoglobin A1c     Status: Abnormal   Collection Time: 04/08/19  4:22 AM  Result Value Ref Range   Hgb A1c MFr Bld 7.9 (H) 4.8 - 5.6 %    Comment: (NOTE) Pre diabetes:          5.7%-6.4% Diabetes:              >6.4% Glycemic control for   <7.0% adults with diabetes    Mean Plasma Glucose 180.03 mg/dL    Comment: Performed at [facility] [address] [place],  [address]  CBG monitoring, ED     Status: Abnormal   Collection Time: 04/08/19  9:47 AM  Result Value Ref Range   Glucose-Capillary 129 (H) 70 - 99 mg/dL  Comment 1 Notify RN    Comment 2 Document in Chart   CBG monitoring, ED     Status: None   Collection Time: 04/08/19 12:35 PM  Result Value Ref Range   Glucose-Capillary 86 70 - 99 mg/dL   Comment 1 Notify RN    Comment 2 Document in Chart      ROS:  Pertinent items noted in HPI and remainder of comprehensive ROS otherwise negative.  Physical Exam: Vitals:   04/08/19 1025 04/08/19 1237  BP:  (!) 156/68  Pulse:  91  Resp:  20  Temp: 99.3 F (37.4 C) 99.7 F (37.6 C)  SpO2:  96%     General exam: Ill-looking female, on a stretcher, mild increased work of breathing Respiratory system: Coarse breath sound bilateral, no wheezing  cardiovascular system: S1 & S2 heard, RRR, no rubs.  Bilateral LE edema Gastrointestinal system: Abdomen is nondistended, soft and nontender. Normal bowel sounds heard. Central nervous system: Alert awake and following commands.. Extremities: Has edema but no cyanosis or clubbing Skin: No rashes, lesions or ulcers Psychiatry: Judgement and insight appear normal. Mood & affect appropriate.   Assessment/Plan:  #AKI on CKD stage IV with fluid overload: She has biopsy-proven FSGS and diabetic changes and follows at  [facility].  She had received Prograf and subsequently 2 doses of rituximab.  She now has UTI and COVID-19 positive.  On examination she is grossly fluid overload, this is likely progression of her CKD. UA with UTI. I will order bladder scan, kidney ultrasound Order Lasix IV I have discussed with the patient that she is close to require dialysis if no improvement in her kidney function or no clinical improvement with current management.  She agreed with dialysis if needed.  #Fluid overload/acute pulmonary edema: Diuretics as above.  #Metabolic acidosis: Start oral sodium bicarbonate.  Minimizing IV fluid.  #Hyperkalemia: Diuretics were held.  I will order a dose of Lokelma.  #COVID-19 positive: Receiving remdesivir and Decadron by primary team.  #UTI: On ceftriaxone.  Follow-up culture result.  Thank you for the consult.  We will follow with you Discussed with the primary team.  Haeley Fordham Tanna Furry 04/08/2019, 1:12 PM  [facility].

## 2019-04-09 ENCOUNTER — Encounter (HOSPITAL_COMMUNITY): Payer: Self-pay | Admitting: Family Medicine

## 2019-04-09 DIAGNOSIS — R778 Other specified abnormalities of plasma proteins: Secondary | ICD-10-CM

## 2019-04-09 DIAGNOSIS — R6520 Severe sepsis without septic shock: Secondary | ICD-10-CM

## 2019-04-09 DIAGNOSIS — R609 Edema, unspecified: Secondary | ICD-10-CM

## 2019-04-09 DIAGNOSIS — J9601 Acute respiratory failure with hypoxia: Secondary | ICD-10-CM

## 2019-04-09 DIAGNOSIS — N179 Acute kidney failure, unspecified: Secondary | ICD-10-CM

## 2019-04-09 LAB — COMPREHENSIVE METABOLIC PANEL
ALT: 23 U/L (ref 0–44)
ALT: 21 U/L (ref 0–44)
AST: 94 U/L — ABNORMAL HIGH (ref 15–41)
AST: 85 U/L — ABNORMAL HIGH (ref 15–41)
Albumin: 1.5 g/dL — ABNORMAL LOW (ref 3.5–5.0)
Albumin: 1.4 g/dL — ABNORMAL LOW (ref 3.5–5.0)
Alkaline Phosphatase: 46 U/L (ref 38–126)
Alkaline Phosphatase: 41 U/L (ref 38–126)
Anion gap: 13 (ref 5–15)
Anion gap: 14 (ref 5–15)
BUN: 75 mg/dL — ABNORMAL HIGH (ref 6–20)
BUN: 80 mg/dL — ABNORMAL HIGH (ref 6–20)
CO2: 11 mmol/L — ABNORMAL LOW (ref 22–32)
CO2: 11 mmol/L — ABNORMAL LOW (ref 22–32)
Calcium: 7.8 mg/dL — ABNORMAL LOW (ref 8.9–10.3)
Calcium: 7.5 mg/dL — ABNORMAL LOW (ref 8.9–10.3)
Chloride: 115 mmol/L — ABNORMAL HIGH (ref 98–111)
Chloride: 114 mmol/L — ABNORMAL HIGH (ref 98–111)
Creatinine, Ser: 7.26 mg/dL — ABNORMAL HIGH (ref 0.44–1.00)
Creatinine, Ser: 7.51 mg/dL — ABNORMAL HIGH (ref 0.44–1.00)
GFR calc Af Amer: 6 mL/min — ABNORMAL LOW (ref >60)
GFR calc Af Amer: 6 mL/min — ABNORMAL LOW (ref >60)
GFR calc non Af Amer: 6 mL/min — ABNORMAL LOW (ref >60)
GFR calc non Af Amer: 5 mL/min — ABNORMAL LOW (ref >60)
Glucose, Bld: 176 mg/dL — ABNORMAL HIGH (ref 70–99)
Glucose, Bld: 129 mg/dL — ABNORMAL HIGH (ref 70–99)
Potassium: 5.5 mmol/L — ABNORMAL HIGH (ref 3.5–5.1)
Potassium: 5.3 mmol/L — ABNORMAL HIGH (ref 3.5–5.1)
Sodium: 139 mmol/L (ref 135–145)
Sodium: 139 mmol/L (ref 135–145)
Total Bilirubin: 0.7 mg/dL (ref 0.3–1.2)
Total Bilirubin: 0.7 mg/dL (ref 0.3–1.2)
Total Protein: 5.2 g/dL — ABNORMAL LOW (ref 6.5–8.1)
Total Protein: 5.2 g/dL — ABNORMAL LOW (ref 6.5–8.1)

## 2019-04-09 LAB — C-REACTIVE PROTEIN: CRP: 17 mg/dL — ABNORMAL HIGH (ref <1.0)

## 2019-04-09 LAB — D-DIMER, QUANTITATIVE: D-Dimer, Quant: 8.22 ug/mL-FEU — ABNORMAL HIGH (ref 0.00–0.50)

## 2019-04-09 LAB — CBG MONITORING, ED
Glucose-Capillary: 183 mg/dL — ABNORMAL HIGH (ref 70–99)
Glucose-Capillary: 90 mg/dL (ref 70–99)
Glucose-Capillary: 124 mg/dL — ABNORMAL HIGH (ref 70–99)

## 2019-04-09 LAB — TROPONIN I (HIGH SENSITIVITY)
Troponin I (High Sensitivity): 1879 ng/L (ref <18)
Troponin I (High Sensitivity): 2156 ng/L (ref <18)
Troponin I (High Sensitivity): 1139 ng/L (ref <18)

## 2019-04-09 MED ORDER — SODIUM CHLORIDE 0.9 % IV SOLN: INTRAVENOUS | Status: DC

## 2019-04-09 MED ORDER — FUROSEMIDE 10 MG/ML IJ SOLN
120.0000 mg | Freq: Three times a day (TID) | INTRAVENOUS | Status: DC
  Administered 2019-04-09 – 2019-04-12 (×8): 120 mg via INTRAVENOUS
  Filled 2019-04-09 (×2): qty 12
  Filled 2019-04-09: qty 4
  Filled 2019-04-09: qty 10
  Filled 2019-04-09: qty 12
  Filled 2019-04-09: qty 2
  Filled 2019-04-09: qty 12
  Filled 2019-04-09: qty 2
  Filled 2019-04-09 (×2): qty 12
  Filled 2019-04-09 (×2): qty 10
  Filled 2019-04-09: qty 12

## 2019-04-09 MED ORDER — ASPIRIN 81 MG PO CHEW
81.0000 mg | CHEWABLE_TABLET | Freq: Every day | ORAL | Status: DC
  Administered 2019-04-11 – 2019-04-14 (×3): 81 mg via ORAL
  Filled 2019-04-09 (×4): qty 1

## 2019-04-09 MED ORDER — DEXTROSE 5 % IV SOLN
500.0000 mg | Freq: Once | INTRAVENOUS | Status: AC
  Administered 2019-04-09: 14:00:00 500 mg via INTRAVENOUS
  Filled 2019-04-09: qty 500

## 2019-04-09 MED ORDER — SODIUM BICARBONATE 650 MG PO TABS
1300.0000 mg | ORAL_TABLET | Freq: Three times a day (TID) | ORAL | Status: DC
  Administered 2019-04-09 – 2019-04-11 (×4): 1300 mg via ORAL
  Filled 2019-04-09 (×6): qty 2

## 2019-04-09 MED ORDER — ASPIRIN 81 MG PO CHEW: 81.0000 mg | CHEWABLE_TABLET | Freq: Once | ORAL | Status: DC

## 2019-04-09 MED ORDER — SODIUM ZIRCONIUM CYCLOSILICATE 10 G PO PACK
10.0000 g | PACK | Freq: Once | ORAL | Status: AC
  Administered 2019-04-09: 08:00:00 10 g via ORAL
  Filled 2019-04-09: qty 1

## 2019-04-09 NOTE — Consult Note (Addendum)
Cardiology Consultation:    Patient ID: Andrea Glenn MRN: [id number]; DOB: Jul 23, 1960  Admit date: 03/31/2019 Date of Consult: 04/09/2019  Primary Care Provider: Guadalupe Dawn, MD Primary Cardiologist:New (Dr. Percival Spanish)   Patient Profile:   Andrea Glenn is a 58 y.o. female with a hx of HTN, HLD, poorly controled DM with neurophathy, FSGS with nephrotic syndrome, tobacco smoking and CKD IV who is being seen today for the evaluation of elevated troponin at the request of Dr. Nori Riis.   No prior cardiac history.  Sister has multiple PCI and CABG.  Mother also had history of CAD.  History of Present Illness:   Ms. Morning presented with few days history of progressive worsening fatigue, weakness and lower extremity edema.  Sister positive for COVID-19.  Patient was found to have sepsis secondary to positive COVID-19 and admitted.  Her renal function is worsened from baseline.  Seen by nephrology and started on diuresis.  She is also placed on steroids, abx and Remdesivir.  Patient denies any chest pain, palpitation, orthopnea, PND or syncope.  High-sensitivity troponin 1919>> 1879>> 2156>> most recently 1139.  CRP 17.  D-dimer 8.22.  Creatinine 7.26.  BNP 1163.   Past Medical History:  Diagnosis Date  . CKD (chronic kidney disease)   . Diabetes mellitus without complication ([place])   . Dysuria 01/20/2017  . Edema of both legs   . Frequent falls 07/22/2017  . Grief 09/08/2017  . Hematuria 01/20/2017   Noted on UA's in June & Oct 2018, possibly related to UTI [ ]  recheck UA at future visit  . Hypertension   . Neuropathy   . Tremor   . Vomiting 11/22/2018    Past Surgical History:  Procedure Laterality Date  . BACK SURGERY    . CESAREAN SECTION    . FOOT SURGERY    . SHOULDER SURGERY       Inpatient Medications: Scheduled Meds: . dexamethasone (DECADRON) injection  6 mg Intravenous Q24H  . FLUoxetine  20 mg Oral Daily  . heparin  7,500 Units Subcutaneous Q8H  . insulin  aspart  0-9 Units Subcutaneous TID WC  . sodium bicarbonate  1,300 mg Oral TID  . sodium chloride flush  3 mL Intravenous Q12H   Continuous Infusions: . cefTRIAXone (ROCEPHIN)  IV Stopped (04/09/19 0814)  . chlorothiazide (DIURIL) IV    . furosemide    . remdesivir 100 mg in NS 100 mL Stopped (04/09/19 1107)   PRN Meds: acetaminophen, amLODipine, ondansetron, polyethylene glycol, traMADol  Allergies:    Allergies  Allergen Reactions  . Atorvastatin Other (See Comments)    Suicidal thoughts  . Statins Other (See Comments)    This class of meds causes "suicidal thoughts"  . Amitriptyline Other (See Comments)    Depression  . Metformin And Related Diarrhea  . Adhesive [Tape] Other (See Comments)    "Sometimes peels off my skin"  . Gabapentin Other (See Comments)    "Gives me the shakes"  . Latex Rash    Social History:   Social History   Socioeconomic History  . Marital status: Single    Spouse name: Not on file  . Number of children: Not on file  . Years of education: Not on file  . Highest education level: Not on file  Occupational History  . Not on file  Tobacco Use  . Smoking status: Current Every Day Smoker    Packs/day: 0.10    Years: 20.00    Pack years: 2.00  Types: Cigarettes    Start date: 65  . Smokeless tobacco: Never Used  . Tobacco comment: working on quitting - 2 per day - 2.26.19  Substance and Sexual Activity  . Alcohol use: No  . Drug use: No  . Sexual activity: Not on file  Other Topics Concern  . Not on file  Social History Narrative   Single. Lives with her son. Current every day smoker.    Social Determinants of Health   Financial Resource Strain:   . Difficulty of Paying Living Expenses: Not on file  Food Insecurity:   . Worried About Charity fundraiser in the Last Year: Not on file  . Ran Out of Food in the Last Year: Not on file  Transportation Needs:   . Lack of Transportation (Medical): Not on file  . Lack of  Transportation (Non-Medical): Not on file  Physical Activity:   . Days of Exercise per Week: Not on file  . Minutes of Exercise per Session: Not on file  Stress:   . Feeling of Stress : Not on file  Social Connections:   . Frequency of Communication with Friends and Family: Not on file  . Frequency of Social Gatherings with Friends and Family: Not on file  . Attends Religious Services: Not on file  . Active Member of Clubs or Organizations: Not on file  . Attends Archivist Meetings: Not on file  . Marital Status: Not on file  Intimate Partner Violence:   . Fear of Current or Ex-Partner: Not on file  . Emotionally Abused: Not on file  . Physically Abused: Not on file  . Sexually Abused: Not on file    Family History:   Family History  Problem Relation Age of Onset  . Kidney failure Mother   . Diabetes Mother   . Thyroid disease Mother   . Hypertension Mother   . Heart failure Mother   . Kidney failure Father   . Cancer Sister   . Kidney failure Sister   . Stroke Brother   . Cancer Other   . Kidney failure Maternal Grandmother   . Kidney failure Maternal Grandfather   . Kidney failure Sister   . Heart attack Sister      ROS:  Please see the history of present illness.  All other ROS reviewed and negative.     Physical Exam/Data:   Vitals:   04/09/19 0600 04/09/19 0700 04/09/19 0800 04/09/19 1000  BP: (!) 151/77 (!) 149/74 140/72 134/64  Pulse: 84 85 79 73  Resp: 14 16 (!) 21 15  Temp: 99.2 F (37.3 C)     TempSrc: Oral     SpO2: 90% 92% 93% 91%  Weight:      Height:        Intake/Output Summary (Last 24 hours) at 04/09/2019 1327 Last data filed at 04/09/2019 1107 Gross per 24 hour  Intake 450 ml  Output 500 ml  Net -50 ml   Last 3 Weights 04/08/2019 02/02/2019 01/19/2019  Weight (lbs) 142 lb 127 lb 127 lb  Weight (kg) 64.411 kg 57.607 kg 57.607 kg     Body mass index is 24.37 kg/m.   VITAL SIGNS:  reviewed GEN:  no acute distress PSYCH:   normal affect  EKG:  The EKG was personally reviewed and demonstrates:  Sinus rhythm at rate of 80 bpm,  Repolarization abnormality   Relevant CV Studies:  Echo 03/2018 Study Conclusions  - Left ventricle: The cavity size  was normal. Systolic function was   normal. The estimated ejection fraction was in the range of 55%   to 60%. Wall motion was normal; there were no regional wall   motion abnormalities. The study is not technically sufficient to   allow evaluation of LV diastolic function. Indeterminate filling   pressure. - Aortic valve: Transvalvular velocity was within the normal range.   There was no stenosis. There was no regurgitation. - Mitral valve: There was trivial regurgitation. - Left atrium: The atrium was mildly dilated. - Right ventricle: The cavity size was normal. Wall thickness was   normal. Systolic function was normal. RV systolic pressure (S,   est): 41 mm Hg. - Right atrium: The atrium was normal in size. Central venous   pressure (est): 15 mm Hg. - Tricuspid valve: There was trivial regurgitation. - Pulmonary arteries: Systolic pressure was moderately increased.   PA peak pressure: 41 mm Hg (S). - Inferior vena cava: The vessel was dilated. Respirophasic changes   in dimension were absent. - Pericardium, extracardiac: There was no pericardial effusion.  Impressions:  - Compared to the exam from 06/15/2017, no significant change has   occured. Side by side comparison of images performed.  Laboratory Data:  Chemistry Recent Labs  Lab 04/17/2019 2359 April 14, 2019 1254 04/09/19 0300  NA 139 142 139  K 5.4* 5.4* 5.5*  CL 115* 119* 115*  CO2 11* 11* 11*  GLUCOSE 166* 92 176*  BUN 62* 68* 75*  CREATININE 6.95* 6.90* 7.26*  CALCIUM 8.2* 7.8* 7.8*  GFRNONAA 6* 6* 6*  GFRAA 7* 7* 6*  ANIONGAP 13 12 13     Recent Labs  Lab April 14, 2019 0422 04/09/19 0300  PROT 6.2* 5.2*  ALBUMIN 1.8* 1.5*  AST 121* 94*  ALT 23 23  ALKPHOS 61 46  BILITOT 0.9 0.7    Hematology Recent Labs  Lab 03/22/2019 2359 04/14/2019 1254  WBC 11.4* 10.3  RBC 3.52* 2.91*  HGB 9.4* 7.9*  HCT 30.7* 25.3*  MCV 87.2 86.9  MCH 26.7 27.1  MCHC 30.6 31.2  RDW 18.4* 18.6*  PLT 177 160   Cardiac EnzymesNo results for input(s): TROPONINI in the last 168 hours. No results for input(s): TROPIPOC in the last 168 hours.  BNP Recent Labs  Lab 04/14/19 0422  BNP 1,163.8*    DDimer  Recent Labs  Lab 04/14/2019 1501 04/09/19 0300  DDIMER 8.75* 8.22*    Radiology/Studies:  US RENAL  Result Date: April 14, 2019 CLINICAL DATA:  58 year old female with acute kidney injury. Hematuria. EXAM: RENAL / URINARY TRACT ULTRASOUND COMPLETE COMPARISON:  11/23/2018 ultrasound FINDINGS: Right Kidney: Renal measurements: 10.6 x 4.1 x 6 cm = volume: 136 mL . Echogenicity within normal limits. No mass or hydronephrosis visualized. Left Kidney: Renal measurements: 8.7 x 4.7 x 5.5 cm = volume: 116 mL. Echogenicity within normal limits. No mass or hydronephrosis visualized. Bladder: Complicated fluid within the bladder noted. No other significant abnormality. Other: Bilateral pleural effusions are present. IMPRESSION: 1. Unremarkable kidneys. 2. Complicated fluid within the bladder which may represent infection or blood given history of hematuria. 3. Bilateral pleural effusions. Electronically Signed   By: Margarette Canada M.D.   On: Apr 14, 2019 14:37   DG Chest Portable 1 View  Result Date: 14-Apr-2019 CLINICAL DATA:  Edema. Patient reports fever and body aches. Bilateral leg swelling. EXAM: PORTABLE CHEST 1 VIEW COMPARISON:  Chest radiograph 07/12/2017 FINDINGS: Borderline cardiomegaly, increased heart size from prior exam. There are small bilateral pleural effusions. Patchy and interstitial opacities  within both lungs. No pneumothorax. Bones are under mineralized. No acute osseous abnormalities are seen. IMPRESSION: 1. Borderline cardiomegaly with bilateral pleural effusions. 2. Patchy and interstitial  opacities, suggesting combination of pulmonary edema and pneumonia, including atypical viral infection. Electronically Signed   By: Keith Rake M.D.   On: 04/08/2019 03:23    Assessment and Plan:   1. Elevated troponin in setting of +COVID and worsen renal function  - Troponin peaked at 2159>>now trending down. She denies chest pain or prior cardiac hx. Elevated troponin likely due to demand ischemia from acute illness. EKG without ischemic changes. No ACS. Consider adding ASA 81mg  qd and statin. Her cardiac risk factors includes Family hx of CAD, HTN, HLD, CKD and DM. Certainly she could have underlying heart disease. Will consider outpatient evaluation once recovered for COVID. HEART Score of 4.   2. Acute on CKD IV - Diuresis per nephrology  3. Positive COVID 19 - on Remdesivir and steroids per primary team  4. UTI - per primary  - on abx  5. HTN - Consider resuming home amlodipine    For questions or updates, please contact [place] Please consult [email or web address] for contact info under     Jarrett Soho, PA  04/09/2019 1:27 PM   History and all data above reviewed.  Patient examined.  I agree with the findings as above.    The patient was admitted with weakness and difficulty with balance.   She walks with a cane.  She came in with increased weakness.  Is found to be positive Covid.  She is being treated for pneumonia.  She is also being treated for urinary tract infection possible sepsis.  Of note she has acute on chronic renal failure.  Her baseline creatinine is 2 to 4 L 7 admission.  He had evidence of volume overload.  He is anemic.  She is hypertensive.  Of note she had troponin ordered and it is elevated as above.  However, she does not report any chest discomfort.  She has no past cardiac history.  He has no past cardiac testing.  EKG demonstrates mild early repolarization but no acute ST elevation suggestive of ischemia.   She denies any recent chest  pressure, neck or arm discomfort.  Prior to that event, PND or orthopnea.  The patient exam reveals COR:RRR, distant heart sounds,no rub  ,  Lungs: Decreased breath sounds  ,  Abd: Positive bowel sounds, no rebound no guarding, Ext Trace edema  .  All available labs, radiology testing, previous records reviewed. Agree with documented assessment and plan.   ELEVATED TROPONIN:  This is non specific in this situation.    She will need an echo given the elevated troponin and the known potential for cardiac complications.   Possible non invasive evaluation (perfusion study) after discharge pending echo.  At this point I would   Diuresis per renal. Start ASA.  DVT prophylaxis heparin.  HTN.  OK to continue and possibly titrate Norvasc for now.   Jeneen Rinks Artina Minella  2:23 PM  04/09/2019

## 2019-04-09 NOTE — Progress Notes (Signed)
Family Medicine Teaching Service Daily Progress Note Intern Pager: [phone]  Patient name: Andrea Glenn Medical record number: [id number] Date of birth: 07/12/1960 Age: 58 y.o. Gender: female  Primary Care Provider: Guadalupe Dawn, MD Consultants: Nephrololgy, wound care Code Status: FULL  Pt Overview and Major Events to Date:  12/20 Admitted to [facility]  Assessment and Plan:  Andrea Glenn is a 58 y.o. female presenting with weakness now with sepsis due to [place]. PMH is significant for focal segmental glomerulonephritis, CKD, DM, HTN, venous stasis, restless leg syndrome.  Sepsis 2/2 COVID Several days with body aches, fever, nausea and cough. COVID + on admission. CRP 17, dimer 8.22. Temp today is 99.2 and is on 3L satting in the 90s. Chest x-ray is notable for bilateral pleural effusions with patchy opacities consistent with pulmonary edema and/or atypical pneumonia. Blood culture w/ gram + rods. Urine culture w/ klebsiella.   -Continue dexamethasone day 2 of 10 and remdesivir day 2 of 5 -Continue ceftriaxone setting of possible UTI; switching to PO when necessary cefdinir -Assess for need to escalate abx  AKI on CKD, Hx of FSGN  UTI Her medical history is significant for FSGN, hypertension, diabetes.  She follows outpatient with nephrology and has previously been treated with tacrolimus which has been discontinued and replaced more recently with rituximab infusions.  In the past year, her baseline creatinine has increased from 2 to 4 and she now is admitted to the hospital with a creatinine close to 7. Renal U/s: Unremarkable kidneys. Complicated fluid within the bladder which may represent infection or blood given history of hematuria. Bilateral pleural effusions. -Nephrology consulted, appreciate recommendations. Cr today 7.26 -Strict I's and O's -Renal diet - continue CTX as above  Hyperkalemia Potassium to 5.4 on admission. Today 5.5  Likely secondary to AKI.  No peak T  waves, widened QRS complexes flattened T waves noted on initial EKG. Trops 1,990>1,879>2,156. We repeat tropes and obtain another EKG today and will continue to monitor. -Follow-up nephrology recommendations -Lokelma x1 -f/u EKG, 2pm CMP -f/u trop  Volume overload Most likely related to her acute kidney failure and sepsis.  Low suspicion for cardiac etiology. Her physical exam was remarkable for a 2/6 systolic murmur at the right sternal border in addition to lower extremity edema and rales on pulmonary auscultation. BNP 1,163.8 consider ECHO. UOP 0.5L -Lasix 80mg  q8h -Consider echo -Appreciate nephro recs appreciated  Lower extremity swelling with pain History and physical consistent with venous stasis.  This does seem to have worsened in the past week or so.  Worsening is most likely related to infection and worsening kidney injury. -Consider gabapentin for worsening pain control -Monitor lower extremity swelling, lasix as above  AST elevation, downtrending Limited to 121 on admission.  Unclear etiology.  Possibly secondary to sepsis. AST 94 today -Repeat LFTs prior to discharge  Diabetes, type II with LE neuropathy Last A1c 5.9 (11/22/18), home medication includes dulaglutide once weekly. CBGs 124-167 -SSI sensitive -Monitor CBGs  Anemia, normocytic  Hemoglobin 9.4 on admission decreased from 11.62 months ago. Hgb 7.9 today. Iron 20 TIBC 111 Ferritin 2,725 -Continue to monitor on daily CBC -Transfuse when necessary  HTN Mildly hypertensive with systolic pressures ranging from 142-161.  Home medication includes amlodipine 10 mg daily.  No concern for hypotension at this time. -Continue amlodipine 10 mg  MDD Currently taking fluoxetine. -Continue fluoxetine  Nicotine use disorder Current smoker.  No desire for nicotine patch at this time. -Order nicotine patch if desired  FEN/GI: general diet Prophylaxis: heparin  Disposition: Med-surg, COVID floor  Subjective:   No acute events overnight. No questions at this time. Is doing ok.   Objective: Temp:  [99.2 F (37.3 C)-99.7 F (37.6 C)] 99.2 F (37.3 C) (12/21 0600) Pulse Rate:  [66-91] 84 (12/21 0600) Resp:  [12-23] 14 (12/21 0600) BP: (130-167)/(64-79) 151/77 (12/21 0600) SpO2:  [88 %-98 %] 90 % (12/21 0600) Physical Exam:  General: Appears ill, no acute distress. Age appropriate. Lying in the bed answering questions appropriately Cardiac: RRR, normal heart sounds, no murmurs Respiratory: Diffuse rales, normal effort Extremities: LE edema, no cyanosis. Skin: Warm and dry, no rashes noted Neuro: alert and oriented, no focal deficits Psych: normal affect   Laboratory: Recent Labs  Lab 04/14/2019 2359 04/08/19 1254  WBC 11.4* 10.3  HGB 9.4* 7.9*  HCT 30.7* 25.3*  PLT 177 160   Recent Labs  Lab 04/12/2019 2359 04/08/19 0422 04/08/19 1254 04/09/19 0300  NA 139  --  142 139  K 5.4*  --  5.4* 5.5*  CL 115*  --  119* 115*  CO2 11*  --  11* 11*  BUN 62*  --  68* 75*  CREATININE 6.95*  --  6.90* 7.26*  CALCIUM 8.2*  --  7.8* 7.8*  PROT  --  6.2*  --  5.2*  BILITOT  --  0.9  --  0.7  ALKPHOS  --  61  --  46  ALT  --  23  --  23  AST  --  121*  --  94*  GLUCOSE 166*  --  92 176*      Imaging/Diagnostic Tests: EXAM: PORTABLE CHEST 1 VIEW COMPARISON:  Chest radiograph 07/12/2017 IMPRESSION: 1. Borderline cardiomegaly with bilateral pleural effusions. 2. Patchy and interstitial opacities, suggesting combination of pulmonary edema and pneumonia, including atypical viral infection.  EXAM: RENAL / URINARY TRACT ULTRASOUND COMPLETE COMPARISON:  11/23/2018 ultrasound IMPRESSION: 1. Unremarkable kidneys. 2. Complicated fluid within the bladder which may represent infection or blood given history of hematuria. 3. Bilateral pleural effusions.   Gerlene Fee, DO 04/09/2019, 1:22 PM PGY-1, [place] Intern pager: [phone], text pages  welcome

## 2019-04-09 NOTE — Progress Notes (Addendum)
[facility] NEPHROLOGY PROGRESS NOTE  Assessment/ Plan: Pt is a 58 y.o. yo female  with history of poorly controlled DM, HTN, HLD, nephrotic syndrome with biopsy-proven FSGS and diabetic nephropathy s/p prograf and rituximab, CKD stage IV with creatinine level somewhere between 3-4 at baseline, presented with worsening leg edema, Covid 19+, seen as a consultation for AKI on CKD.  #AKI on CKD stage IV with fluid overload: She has biopsy-proven FSGS and diabetic changes and follows at [place].  She had received Prograf and subsequently 2 doses of rituximab.  She now has UTI and COVID-19 positive.  During examination she was grossly fluid overload, this is likely progression of her CKD. Kidney US: No hydronephrosis Minimal response with IV Lasix with worsening BUN and creatinine level.  I am going to increase Lasix to 120 mg IV q8hr and add a dose of chlorothiazide 500 mg IV. Watch for any response in increased urination lab parameters. If no improvement by tomorrow she will probably need dialysis.I have discussed with the patient and she agreed for HD when needed.  #Fluid overload/acute pulmonary edema: Diuretics as above.  #Metabolic acidosis: No improvement.  Increase sodium bicarbonate dose.  Minimizing IV fluid.  #Hyperkalemia: Diuretics should help.  Order a dose of Lokelma  #COVID-19 positive: Receiving remdesivir and Decadron by primary team.  #UTI: On ceftriaxone.  Follow-up culture result.  Subjective: Chart and lab results reviewed.She is still in ER. Discussed with the primary team.  No new event.  Urine output is recorded only 500 cc. Objective Vital signs in last 24 hours: Vitals:   04/09/19 0600 04/09/19 0700 04/09/19 0800 04/09/19 1000  BP: (!) 151/77 (!) 149/74 140/72 134/64  Pulse: 84 85 79 73  Resp: 14 16 (!) 21 15  Temp: 99.2 F (37.3 C)     TempSrc: Oral     SpO2: 90% 92% 93% 91%  Weight:      Height:       Weight change:   Intake/Output  Summary (Last 24 hours) at 04/09/2019 1153 Last data filed at 04/09/2019 1107 Gross per 24 hour  Intake 450 ml  Output 500 ml  Net -50 ml       Labs: Basic Metabolic Panel: Recent Labs  Lab 03/25/2019 2359 04/08/19 1254 04/09/19 0300  NA 139 142 139  K 5.4* 5.4* 5.5*  CL 115* 119* 115*  CO2 11* 11* 11*  GLUCOSE 166* 92 176*  BUN 62* 68* 75*  CREATININE 6.95* 6.90* 7.26*  CALCIUM 8.2* 7.8* 7.8*   Liver Function Tests: Recent Labs  Lab 04/08/19 0422 04/09/19 0300  AST 121* 94*  ALT 23 23  ALKPHOS 61 46  BILITOT 0.9 0.7  PROT 6.2* 5.2*  ALBUMIN 1.8* 1.5*   No results for input(s): LIPASE, AMYLASE in the last 168 hours. No results for input(s): AMMONIA in the last 168 hours. CBC: Recent Labs  Lab 04/05/2019 2359 04/08/19 1254  WBC 11.4* 10.3  NEUTROABS 9.8*  --   HGB 9.4* 7.9*  HCT 30.7* 25.3*  MCV 87.2 86.9  PLT 177 160   Cardiac Enzymes: No results for input(s): CKTOTAL, CKMB, CKMBINDEX, TROPONINI in the last 168 hours. CBG: Recent Labs  Lab 04/08/19 0947 04/08/19 1235 04/08/19 1831 04/08/19 2320 04/09/19 0811  GLUCAP 129* 86 124* 167* 183*    Iron Studies:  Recent Labs    04/08/19 1254  IRON 20*  TIBC 111*  FERRITIN 2,725*   Studies/Results: US RENAL  Result Date: 04/08/2019 CLINICAL DATA:  58 year old female with acute kidney injury. Hematuria. EXAM: RENAL / URINARY TRACT ULTRASOUND COMPLETE COMPARISON:  11/23/2018 ultrasound FINDINGS: Right Kidney: Renal measurements: 10.6 x 4.1 x 6 cm = volume: 136 mL . Echogenicity within normal limits. No mass or hydronephrosis visualized. Left Kidney: Renal measurements: 8.7 x 4.7 x 5.5 cm = volume: 116 mL. Echogenicity within normal limits. No mass or hydronephrosis visualized. Bladder: Complicated fluid within the bladder noted. No other significant abnormality. Other: Bilateral pleural effusions are present. IMPRESSION: 1. Unremarkable kidneys. 2. Complicated fluid within the bladder which may  represent infection or blood given history of hematuria. 3. Bilateral pleural effusions. Electronically Signed   By: Margarette Canada M.D.   On: 04/08/2019 14:37   DG Chest Portable 1 View  Result Date: 04/08/2019 CLINICAL DATA:  Edema. Patient reports fever and body aches. Bilateral leg swelling. EXAM: PORTABLE CHEST 1 VIEW COMPARISON:  Chest radiograph 07/12/2017 FINDINGS: Borderline cardiomegaly, increased heart size from prior exam. There are small bilateral pleural effusions. Patchy and interstitial opacities within both lungs. No pneumothorax. Bones are under mineralized. No acute osseous abnormalities are seen. IMPRESSION: 1. Borderline cardiomegaly with bilateral pleural effusions. 2. Patchy and interstitial opacities, suggesting combination of pulmonary edema and pneumonia, including atypical viral infection. Electronically Signed   By: Keith Rake M.D.   On: 04/08/2019 03:23    Medications: Infusions: . cefTRIAXone (ROCEPHIN)  IV Stopped (04/09/19 [id number])  . remdesivir 100 mg in NS 100 mL Stopped (04/09/19 1107)    Scheduled Medications: . dexamethasone (DECADRON) injection  6 mg Intravenous Q24H  . FLUoxetine  20 mg Oral Daily  . furosemide  80 mg Intravenous Q8H  . heparin  7,500 Units Subcutaneous Q8H  . insulin aspart  0-9 Units Subcutaneous TID WC  . sodium bicarbonate  1,300 mg Oral BID  . sodium chloride flush  3 mL Intravenous Q12H    have reviewed scheduled and prn medications.  Physical Exam: Patient was not examined directly because of COVID-19 pneumonia in order to lower exposure and to preserve PPE.  I have discussed with the primary team and reviewed the pertinent physical examination.  Clinically same as yesterday, has lower extremity edema. Mileidy Atkin Tanna Furry 04/09/2019,11:53 AM  LOS: 1 day  Pager: [phone]

## 2019-04-09 NOTE — Progress Notes (Signed)
FPTS Interim Progress Note  Patient with uptrending troponins. Although the likely cause is demand ischemia, will continue to trend until flat or decreasing. Will also obtain AM EKG. Called RN, patient without chest pain ON and during his shift.   Called cards for consult, they plan to see patient   Caroline More, DO 04/09/2019, 8:25 AM PGY-3, [place] Medicine Service pager [phone]

## 2019-04-09 NOTE — ED Notes (Addendum)
ED TO INPATIENT HANDOFF REPORT  ED Nurse Name and Phone #:  [phone]  S Name/Age/Gender Andrea Glenn 58 y.o. female Room/Bed: 006C/006C  Code Status   Code Status: Full Code  Home/SNF/Other Home Patient oriented to: self, place, time and situation Is this baseline? Yes   Triage Complete: Triage complete  Chief Complaint Sepsis [facility]) [A41.9]  Triage Note Per pt she has been having bilateral leg swelling and pain  for 1 week week.  Pain also in her back. Seen pcp with no relief. Pt said causing her to fall and not able to walk very well.    Allergies Allergies  Allergen Reactions  . Atorvastatin Other (See Comments)    Suicidal thoughts  . Statins Other (See Comments)    This class of meds causes "suicidal thoughts"  . Amitriptyline Other (See Comments)    Depression  . Metformin And Related Diarrhea  . Adhesive [Tape] Other (See Comments)    "Sometimes peels off my skin"  . Gabapentin Other (See Comments)    "Gives me the shakes"  . Latex Rash    Level of Care/Admitting Diagnosis ED Disposition    ED Disposition Condition [facility] Area: [place] [[id number]]  Level of Care: Med-Surg [16]  Covid Evaluation: Confirmed COVID Positive  [facility]: [id number]  Admitting Physician: Martyn Malay [id number]  Attending Physician: Martyn Malay [id number]  Estimated length of stay: past midnight tomorrow  Certification:: I certify this patient will need inpatient services for at least 2 midnights       B Medical/Surgery History Past Medical History:  Diagnosis Date  . CKD (chronic kidney disease)   . Diabetes mellitus without complication ([place])   . Dysuria 01/20/2017  . Edema of both legs   . Frequent falls 07/22/2017  . Grief 09/08/2017  . Hematuria 01/20/2017   Noted on UA's in June & Oct 2018, possibly related to UTI [ ]  recheck UA at future visit  . Hypertension   . Neuropathy   . Tremor   . Vomiting  11/22/2018   Past Surgical History:  Procedure Laterality Date  . BACK SURGERY    . CESAREAN SECTION    . FOOT SURGERY    . SHOULDER SURGERY       A IV Location/Drains/Wounds Patient Lines/Drains/Airways Status   Active Line/Drains/Airways    Name:   Placement date:   Placement time:   Site:   Days:   Peripheral IV 04/08/19 Left Antecubital   04/08/19    0429    Antecubital   1   Peripheral IV 04/08/19 Right Antecubital   04/08/19    0429    Antecubital   1   External Urinary Catheter   04/07/18    0930    --   367          Intake/Output Last 24 hours  Intake/Output Summary (Last 24 hours) at 04/09/2019 [id number] Last data filed at 04/08/2019 2155 Gross per 24 hour  Intake 588.46 ml  Output 500 ml  Net 88.46 ml    Labs/Imaging Results for orders placed or performed during the hospital encounter of 03/28/2019 (from the past 48 hour(s))  CBC with Differential     Status: Abnormal   Collection Time: 04/13/2019 11:59 PM  Result Value Ref Range   WBC 11.4 (H) 4.0 - 10.5 K/uL   RBC 3.52 (L) 3.87 - 5.11 MIL/uL   Hemoglobin 9.4 (L) 12.0 - 15.0 g/dL  HCT 30.7 (L) 36.0 - 46.0 %   MCV 87.2 80.0 - 100.0 fL   MCH 26.7 26.0 - 34.0 pg   MCHC 30.6 30.0 - 36.0 g/dL   RDW 18.4 (H) 11.5 - 15.5 %   Platelets 177 150 - 400 K/uL   nRBC 0.0 0.0 - 0.2 %   Neutrophils Relative % 86 %   Neutro Abs 9.8 (H) 1.7 - 7.7 K/uL   Lymphocytes Relative 9 %   Lymphs Abs 1.1 0.7 - 4.0 K/uL   Monocytes Relative 4 %   Monocytes Absolute 0.5 0.1 - 1.0 K/uL   Eosinophils Relative 0 %   Eosinophils Absolute 0.0 0.0 - 0.5 K/uL   Basophils Relative 0 %   Basophils Absolute 0.0 0.0 - 0.1 K/uL   Immature Granulocytes 1 %   Abs Immature Granulocytes 0.06 0.00 - 0.07 K/uL    Comment: Performed at [facility] [address], [place], [place] [id number]  Basic metabolic panel     Status: Abnormal   Collection Time: 03/29/2019 11:59 PM  Result Value Ref Range   Sodium 139 135 - 145 mmol/L   Potassium 5.4  (H) 3.5 - 5.1 mmol/L   Chloride 115 (H) 98 - 111 mmol/L   CO2 11 (L) 22 - 32 mmol/L   Glucose, Bld 166 (H) 70 - 99 mg/dL   BUN 62 (H) 6 - 20 mg/dL   Creatinine, Ser 6.95 (H) 0.44 - 1.00 mg/dL   Calcium 8.2 (L) 8.9 - 10.3 mg/dL   GFR calc non Af Amer 6 (L) >60 mL/min   GFR calc Af Amer 7 (L) >60 mL/min   Anion gap 13 5 - 15    Comment: Performed at [facility] [address] [place], [place] [address]  Urinalysis, Routine w reflex microscopic     Status: Abnormal   Collection Time: 04/08/19  3:50 AM  Result Value Ref Range   Color, Urine AMBER (A) YELLOW    Comment: BIOCHEMICALS MAY BE AFFECTED BY COLOR   APPearance CLOUDY (A) CLEAR   Specific Gravity, Urine 1.020 1.005 - 1.030   pH 8.0 5.0 - 8.0   Glucose, UA 50 (A) NEGATIVE mg/dL   Hgb urine dipstick SMALL (A) NEGATIVE   Bilirubin Urine NEGATIVE NEGATIVE   Ketones, ur 5 (A) NEGATIVE mg/dL   Protein, ur >=300 (A) NEGATIVE mg/dL   Nitrite NEGATIVE NEGATIVE   Leukocytes,Ua MODERATE (A) NEGATIVE   RBC / HPF 6-10 0 - 5 RBC/hpf   WBC, UA >50 (H) 0 - 5 WBC/hpf   Bacteria, UA RARE (A) NONE SEEN   WBC Clumps PRESENT    Mucus PRESENT     Comment: Performed at [facility], [address], [place], [place] [address]  Culture, blood (routine x 2)     Status: None (Preliminary result)   Collection Time: 04/08/19  4:05 AM   Specimen: BLOOD  Result Value Ref Range   Specimen Description BLOOD LEFT ANTECUBITAL    Special Requests      BOTTLES DRAWN AEROBIC AND ANAEROBIC Blood Culture results may not be optimal due to an excessive volume of blood received in culture bottles   Culture  Setup Time      GRAM POSITIVE RODS AEROBIC BOTTLE ONLY CRITICAL RESULT CALLED TO, READ BACK BY AND VERIFIED WITH: Bentley @2103  04/08/19 BY S GEZAHEGN Performed at [facility], [address], [place], [place] [address]    Culture  PENDING    Report Status PENDING   Lactic acid, plasma     Status: None   Collection Time:  04/08/19  4:22 AM  Result Value Ref Range   Lactic Acid, Venous 1.0 0.5 - 1.9 mmol/L    Comment: Performed at [facility], [place] [address] [place], [place] [address]  SARS CORONAVIRUS 2 (TAT 6-24 HRS) Nasopharyngeal Nasopharyngeal Swab     Status: Abnormal   Collection Time: 04/08/19  4:22 AM   Specimen: Nasopharyngeal Swab  Result Value Ref Range   SARS Coronavirus 2 POSITIVE (A) NEGATIVE    Comment: RESULT CALLED TO, READ BACK BY AND VERIFIED WITH: SHYNIECE WILSON RN.@1218  ON 12.20.2020 BY TCALDWELL MT. (NOTE) SARS-CoV-2 target nucleic acids are DETECTED. The SARS-CoV-2 RNA is generally detectable in upper and lower respiratory specimens during the acute phase of infection. Positive results are indicative of the presence of SARS-CoV-2 RNA. Clinical correlation with patient history and other diagnostic information is  necessary to determine patient infection status. Positive results do not rule out bacterial infection or co-infection with other viruses.  The expected result is Negative. Fact Sheet for Patients: SugarRoll.be Fact Sheet for Healthcare Providers: https://www.woods-mathews.com/ This test is not yet approved or cleared by the [place] FDA and  has been authorized for detection and/or diagnosis of SARS-CoV-2 by FDA under an Emergency Use Authorization (EUA). This EUA will remain  in effect (meaning this test  can be used) for the duration of the COVID-19 declaration under Section 564(b)(1) of the Act, 21 U.S.C. section 360bbb-3(b)(1), unless the authorization is terminated or revoked sooner. Performed at [facility], [place] [address], [place], [place] [address]   Hepatic function panel     Status: Abnormal   Collection Time: 04/08/19  4:22 AM  Result Value Ref Range   Total Protein 6.2 (L) 6.5 - 8.1 g/dL   Albumin 1.8 (L) 3.5 - 5.0 g/dL   AST 121 (H) 15 - 41 U/L   ALT 23 0 - 44 U/L   Alkaline Phosphatase 61  38 - 126 U/L   Total Bilirubin 0.9 0.3 - 1.2 mg/dL   Bilirubin, Direct <0.1 0.0 - 0.2 mg/dL   Indirect Bilirubin NOT CALCULATED 0.3 - 0.9 mg/dL    Comment: Performed at [facility] [address] [place], [place] [address]  HIV Antibody (routine testing w rflx)     Status: None   Collection Time: 04/08/19  4:22 AM  Result Value Ref Range   HIV Screen 4th Generation wRfx NON REACTIVE NON REACTIVE    Comment: Performed at [facility] [address] [place], [place] [address]  Brain natriuretic peptide     Status: Abnormal   Collection Time: 04/08/19  4:22 AM  Result Value Ref Range   B Natriuretic Peptide 1,163.8 (H) 0.0 - 100.0 pg/mL    Comment: Performed at [facility] [address] [place], [place] [address]  Hemoglobin A1c     Status: Abnormal   Collection Time: 04/08/19  4:22 AM  Result Value Ref Range   Hgb A1c MFr Bld 7.9 (H) 4.8 - 5.6 %    Comment: (NOTE) Pre diabetes:          5.7%-6.4% Diabetes:              >6.4% Glycemic control for   <7.0% adults with diabetes    Mean Plasma Glucose 180.03 mg/dL    Comment: Performed at [facility]  Lab, [address], [place], [place] [address]  CBG monitoring, ED     Status: Abnormal   Collection Time: 04/08/19  9:47 AM  Result Value Ref Range   Glucose-Capillary 129 (H) 70 - 99 mg/dL   Comment 1 Notify RN    Comment 2 Document in Chart   CBG monitoring, ED     Status: None   Collection Time: 04/08/19 12:35 PM  Result Value Ref Range   Glucose-Capillary 86 70 - 99 mg/dL   Comment 1 Notify RN    Comment 2 Document in Chart   Basic metabolic panel     Status: Abnormal   Collection Time: 04/08/19 12:54 PM  Result Value Ref Range   Sodium 142 135 - 145 mmol/L   Potassium 5.4 (H) 3.5 - 5.1 mmol/L   Chloride 119 (H) 98 - 111 mmol/L   CO2 11 (L) 22 - 32 mmol/L   Glucose, Bld 92 70 - 99 mg/dL   BUN 68 (H) 6 - 20 mg/dL   Creatinine, Ser 6.90 (H) 0.44 - 1.00 mg/dL   Calcium 7.8 (L) 8.9 - 10.3 mg/dL    GFR calc non Af Amer 6 (L) >60 mL/min   GFR calc Af Amer 7 (L) >60 mL/min   Anion gap 12 5 - 15    Comment: Performed at [facility] [address], [place], [place] [address]  CBC     Status: Abnormal   Collection Time: 04/08/19 12:54 PM  Result Value Ref Range   WBC 10.3 4.0 - 10.5 K/uL   RBC 2.91 (L) 3.87 - 5.11 MIL/uL   Hemoglobin 7.9 (L) 12.0 - 15.0 g/dL   HCT 25.3 (L) 36.0 - 46.0 %   MCV 86.9 80.0 - 100.0 fL   MCH 27.1 26.0 - 34.0 pg   MCHC 31.2 30.0 - 36.0 g/dL   RDW 18.6 (H) 11.5 - 15.5 %   Platelets 160 150 - 400 K/uL   nRBC 0.4 (H) 0.0 - 0.2 %    Comment: Performed at [facility] [address], [place], [place] [address]  Ferritin     Status: Abnormal   Collection Time: 04/08/19 12:54 PM  Result Value Ref Range   Ferritin 2,725 (H) 11 - 307 ng/mL    Comment: Performed at [facility], [place] [address], [place], [place] [address]  Iron and TIBC     Status: Abnormal   Collection Time: 04/08/19 12:54 PM  Result Value Ref Range   Iron 20 (L) 28 - 170 ug/dL   TIBC 111 (L) 250 - 450 ug/dL   Saturation Ratios 18 10.4 - 31.8 %   UIBC 91 ug/dL    Comment: Performed at [facility], [place] [address] [place], [place] [address]  C-reactive protein     Status: Abnormal   Collection Time: 04/08/19 12:54 PM  Result Value Ref Range   CRP 12.3 (H) <1.0 mg/dL    Comment: Performed at [facility], [place] [address] [place], [place] [address]  Lactate dehydrogenase     Status: Abnormal   Collection Time: 04/08/19 12:54 PM  Result Value Ref Range   LDH 678 (H) 98 - 192 U/L    Comment: Performed at [facility], [place] [address], [place], [place] [address]  Procalcitonin     Status: None   Collection Time: 04/08/19 12:54 PM  Result Value Ref Range   Procalcitonin 2.02 ng/mL  Comment:        Interpretation: PCT > 2 ng/mL: Systemic infection (sepsis) is likely, unless other causes are known. (NOTE)       Sepsis PCT Algorithm           Lower  Respiratory Tract                                      Infection PCT Algorithm    ----------------------------     ----------------------------         PCT < 0.25 ng/mL                PCT < 0.10 ng/mL         Strongly encourage             Strongly discourage   discontinuation of antibiotics    initiation of antibiotics    ----------------------------     -----------------------------       PCT 0.25 - 0.50 ng/mL            PCT 0.10 - 0.25 ng/mL               OR       >80% decrease in PCT            Discourage initiation of                                            antibiotics      Encourage discontinuation           of antibiotics    ----------------------------     -----------------------------         PCT >= 0.50 ng/mL              PCT 0.26 - 0.50 ng/mL               AND       <80% decrease in PCT              Encourage initiation of                                             antibiotics       Encourage continuation           of antibiotics    ----------------------------     -----------------------------        PCT >= 0.50 ng/mL                  PCT > 0.50 ng/mL               AND         increase in PCT                  Strongly encourage                                      initiation of antibiotics    Strongly encourage escalation           of antibiotics                                     -----------------------------  PCT <= 0.25 ng/mL                                                 OR                                        > 80% decrease in PCT                                     Discontinue / Do not initiate                                             antibiotics Performed at [facility], [place] [address] [place], [place] [address]   D-dimer, quantitative (not at [facility])     Status: Abnormal   Collection Time: 04/08/19  3:01 PM  Result Value Ref Range   D-Dimer, Quant 8.75 (H) 0.00 - 0.50 ug/mL-FEU    Comment: (NOTE) At  the manufacturer cut-off of 0.50 ug/mL FEU, this assay has been documented to exclude PE with a sensitivity and negative predictive value of 97 to 99%.  At this time, this assay has not been approved by the FDA to exclude DVT/VTE. Results should be correlated with clinical presentation. Performed at [facility], [place] [address], [place], [place] [address]   CBG monitoring, ED     Status: Abnormal   Collection Time: 04/08/19  6:31 PM  Result Value Ref Range   Glucose-Capillary 124 (H) 70 - 99 mg/dL  Troponin I (High Sensitivity)     Status: Abnormal   Collection Time: 04/08/19  8:31 PM  Result Value Ref Range   Troponin I (High Sensitivity) 1,990 (HH) <18 ng/L    Comment: CRITICAL RESULT CALLED TO, READ BACK BY AND VERIFIED WITH: Timmothy Euler [facility] 04/08/19 2137 WAYK Performed at [facility], [place] [address], [place], [place] [address]   Troponin I (High Sensitivity)     Status: Abnormal   Collection Time: 04/08/19 11:10 PM  Result Value Ref Range   Troponin I (High Sensitivity) 1,879 (HH) <18 ng/L    Comment: CRITICAL VALUE NOTED.  VALUE IS CONSISTENT WITH PREVIOUSLY REPORTED AND CALLED VALUE. Performed at [facility], [place] [address], [place], [place] [address]   CBG monitoring, ED     Status: Abnormal   Collection Time: 04/08/19 11:20 PM  Result Value Ref Range   Glucose-Capillary 167 (H) 70 - 99 mg/dL  Troponin I (High Sensitivity)     Status: Abnormal   Collection Time: 04/09/19  1:05 AM  Result Value Ref Range   Troponin I (High Sensitivity) 2,156 (HH) <18 ng/L    Comment: CRITICAL VALUE NOTED.  VALUE IS CONSISTENT WITH PREVIOUSLY REPORTED AND CALLED VALUE. Performed at [facility], [place] [address], [place],  [address]   D-dimer, quantitative (not at [facility])     Status: Abnormal   Collection Time: 04/09/19  3:00 AM  Result Value Ref Range   D-Dimer, Quant 8.22 (H) 0.00 - 0.50 ug/mL-FEU    Comment: (  NOTE) At the manufacturer cut-off of 0.50 ug/mL  FEU, this assay has been documented to exclude PE with a sensitivity and negative predictive value of 97 to 99%.  At this time, this assay has not been approved by the FDA to exclude DVT/VTE. Results should be correlated with clinical presentation. Performed at [facility], [place] [address], [place], [place] [address]   C-reactive protein     Status: Abnormal   Collection Time: 04/09/19  3:00 AM  Result Value Ref Range   CRP 17.0 (H) <1.0 mg/dL    Comment: Performed at [facility] [address] [place], [place] [address]  Comprehensive metabolic panel     Status: Abnormal   Collection Time: 04/09/19  3:00 AM  Result Value Ref Range   Sodium 139 135 - 145 mmol/L   Potassium 5.5 (H) 3.5 - 5.1 mmol/L   Chloride 115 (H) 98 - 111 mmol/L   CO2 11 (L) 22 - 32 mmol/L   Glucose, Bld 176 (H) 70 - 99 mg/dL   BUN 75 (H) 6 - 20 mg/dL   Creatinine, Ser 7.26 (H) 0.44 - 1.00 mg/dL   Calcium 7.8 (L) 8.9 - 10.3 mg/dL   Total Protein 5.2 (L) 6.5 - 8.1 g/dL   Albumin 1.5 (L) 3.5 - 5.0 g/dL   AST 94 (H) 15 - 41 U/L   ALT 23 0 - 44 U/L   Alkaline Phosphatase 46 38 - 126 U/L   Total Bilirubin 0.7 0.3 - 1.2 mg/dL   GFR calc non Af Amer 6 (L) >60 mL/min   GFR calc Af Amer 6 (L) >60 mL/min   Anion gap 13 5 - 15    Comment: Performed at [facility], [place] [address] [place], [place] [address]   US RENAL  Result Date: 04/08/2019 CLINICAL DATA:  58 year old female with acute kidney injury. Hematuria. EXAM: RENAL / URINARY TRACT ULTRASOUND COMPLETE COMPARISON:  11/23/2018 ultrasound FINDINGS: Right Kidney: Renal measurements: 10.6 x 4.1 x 6 cm = volume: 136 mL . Echogenicity within normal limits. No mass or hydronephrosis visualized. Left Kidney: Renal measurements: 8.7 x 4.7 x 5.5 cm = volume: 116 mL. Echogenicity within normal limits. No mass or hydronephrosis visualized. Bladder: Complicated fluid within the bladder noted. No other significant abnormality. Other: Bilateral pleural  effusions are present. IMPRESSION: 1. Unremarkable kidneys. 2. Complicated fluid within the bladder which may represent infection or blood given history of hematuria. 3. Bilateral pleural effusions. Electronically Signed   By: Margarette Canada M.D.   On: 04/08/2019 14:37   DG Chest Portable 1 View  Result Date: 04/08/2019 CLINICAL DATA:  Edema. Patient reports fever and body aches. Bilateral leg swelling. EXAM: PORTABLE CHEST 1 VIEW COMPARISON:  Chest radiograph 07/12/2017 FINDINGS: Borderline cardiomegaly, increased heart size from prior exam. There are small bilateral pleural effusions. Patchy and interstitial opacities within both lungs. No pneumothorax. Bones are under mineralized. No acute osseous abnormalities are seen. IMPRESSION: 1. Borderline cardiomegaly with bilateral pleural effusions. 2. Patchy and interstitial opacities, suggesting combination of pulmonary edema and pneumonia, including atypical viral infection. Electronically Signed   By: Keith Rake M.D.   On: 04/08/2019 03:23    Pending Labs Unresulted Labs (From admission, onward)    Start     Ordered   04/09/19 0500  Comprehensive metabolic panel  Daily,   R     04/08/19 1307   04/08/19 1308  D-dimer, quantitative (not at [facility])  Daily,   R  04/08/19 1307   04/08/19 1308  C-reactive protein  Daily,   R     04/08/19 1307   04/08/19 0328  Culture, blood (routine x 2)  BLOOD CULTURE X 2,   STAT     04/08/19 0328   04/08/19 0307  Urine culture  ONCE - STAT,   STAT     04/08/19 0306          Vitals/Pain Today's Vitals   04/09/19 0430 04/09/19 0445 04/09/19 0515 04/09/19 0600  BP: (!) 164/74 (!) 154/77 (!) 153/77 (!) 151/77  Pulse: 79 80 81 84  Resp: 14 20 12 14   Temp:    99.2 F (37.3 C)  TempSrc:    Oral  SpO2: 91% 91% 91% 90%  Weight:      Height:      PainSc:    10-Worst pain ever    Isolation Precautions Airborne and Contact precautions  Medications Medications  acetaminophen (TYLENOL) tablet 1,000 mg  (1,000 mg Oral Given 04/09/19 0614)  amLODipine (NORVASC) tablet 2.5 mg (has no administration in time range)  FLUoxetine (PROZAC) capsule 20 mg (20 mg Oral Refused 04/08/19 1306)  ondansetron (ZOFRAN) tablet 4 mg (has no administration in time range)  polyethylene glycol (MIRALAX / GLYCOLAX) packet 17 g (has no administration in time range)  insulin aspart (novoLOG) injection 0-9 Units (1 Units Subcutaneous Given 04/08/19 2021)  traMADol (ULTRAM) tablet 50 mg (50 mg Oral Given 04/09/19 0614)  cefTRIAXone (ROCEPHIN) 1 g in sodium chloride 0.9 % 100 mL IVPB (1 g Intravenous New Bag/Given 04/09/19 0615)  sodium chloride flush (NS) 0.9 % injection 3 mL (3 mLs Intravenous Given 04/08/19 2309)  dexamethasone (DECADRON) injection 6 mg (6 mg Intravenous Given 04/08/19 1451)  remdesivir 200 mg in sodium chloride 0.9% 250 mL IVPB (0 mg Intravenous Stopped 04/08/19 1718)    Followed by  remdesivir 100 mg in sodium chloride 0.9 % 100 mL IVPB (has no administration in time range)  furosemide (LASIX) injection 80 mg (80 mg Intravenous Given 04/09/19 0615)  sodium bicarbonate tablet 1,300 mg (1,300 mg Oral Given 04/08/19 2324)  heparin injection 7,500 Units (7,500 Units Subcutaneous Given 04/09/19 0614)  furosemide (LASIX) injection 80 mg (80 mg Intravenous Given 04/08/19 0425)  acetaminophen (TYLENOL) tablet 650 mg (650 mg Oral Given 04/08/19 0551)  cefTRIAXone (ROCEPHIN) 1 g in sodium chloride 0.9 % 100 mL IVPB ( Intravenous Stopped 04/08/19 0621)  azithromycin (ZITHROMAX) 500 mg in sodium chloride 0.9 % 250 mL IVPB ( Intravenous Stopped 04/08/19 0705)  morphine 4 MG/ML injection 4 mg (4 mg Intravenous Given 04/08/19 0552)  sodium zirconium cyclosilicate (LOKELMA) packet 10 g (10 g Oral Given 04/08/19 1505)    Mobility walks with device High fall risk   Focused Assessments Cardiac Assessment Handoff:    Lab Results  Component Value Date   CKTOTAL 92 07/22/2017   TROPONINI 0.03 (HH) 07/23/2017    Lab Results  Component Value Date   DDIMER 8.22 (H) 04/09/2019   Does the Patient currently have chest pain? No  , Pulmonary Assessment Handoff:  Lung sounds:   O2 Device: Nasal Cannula O2 Flow Rate (L/min): 3 L/min      R Recommendations: See Admitting Provider Note  Report given to:   Additional Notes:  Patient's grandson lives with her. Asked pt had she considered dialysis and would this be something she wants; pt states "I don't know". Pt is tremulous, edematous (all over), but is A&O x4. Pt has Pure-wik in place.  Oxygen is currently at 3lpm via [place] with pt maintaining sats @ 89-90%

## 2019-04-09 NOTE — Progress Notes (Signed)
FPTS Interim Progress Note  Late entry note overnight 12/20-12/21  After discussion during handoff the night team ordered a troponin to complete patient's cardiac work-up.  Troponin came back at [facility].  We called the patient's room and spoke with the patient's nurse he reported no complaints of chest pain or worsening shortness of breath.  A repeat EKG was ordered which was stable from previous EKG.  Troponins were trended to 1879> 2156.  Given that the patient is Covid positive, EKG is stable, and patient has no symptoms of ACS this is most likely due to heart strain rather than acute coronary syndrome.  There is no need to further trend troponins.  We will continue to monitor for signs and symptoms of acute coronary syndrome.  Gifford Shave, MD 04/09/2019, 8:01 AM PGY-1, [place] [facility] Service pager [phone]

## 2019-04-09 NOTE — Progress Notes (Signed)
FPTS Interim Progress Note  S/P lokelma today, K 5.3.  Nephrology increased patient's Lasix to 120mg  q8h today, therefore will hold off on another dose of lokelma at this time, as suspect should improve with this. Nephro considering HD if no improvement tomorrow.  Will continue to monitor closely and f/u K on BMP in AM.  [place], Bernita Raisin, DO 04/09/2019, 5:21 PM PGY-2, [place] Medicine Service pager [phone]

## 2019-04-09 NOTE — ED Notes (Signed)
ED TO INPATIENT HANDOFF REPORT  ED Nurse Name and Phone #: Thurmond Butts Merino Name/Age/Gender Andrea Glenn 58 y.o. female Room/Bed: 006C/006C  Code Status   Code Status: Full Code  Home/SNF/Other Home Patient oriented to: self, place, time and situation Is this baseline? Yes   Triage Complete: Triage complete  Chief Complaint Sepsis [facility]) [A41.9]  Triage Note Per pt she has been having bilateral leg swelling and pain  for 1 week week.  Pain also in her back. Seen pcp with no relief. Pt said causing her to fall and not able to walk very well.    Allergies Allergies  Allergen Reactions  . Atorvastatin Other (See Comments)    Suicidal thoughts  . Statins Other (See Comments)    This class of meds causes "suicidal thoughts"  . Amitriptyline Other (See Comments)    Depression  . Metformin And Related Diarrhea  . Adhesive [Tape] Other (See Comments)    "Sometimes peels off my skin"  . Gabapentin Other (See Comments)    "Gives me the shakes"  . Latex Rash    Level of Care/Admitting Diagnosis ED Disposition    ED Disposition Condition [facility] [place] [100100]  Level of Care: Med-Surg [16]  Covid Evaluation: Confirmed COVID Positive  Diagnosis: Sepsis [id number]  Admitting Physician: Martyn Malay [id number]  Attending Physician: Martyn Malay [id number]  Estimated length of stay: past midnight tomorrow  Certification:: I certify this patient will need inpatient services for at least 2 midnights       B Medical/Surgery History Past Medical History:  Diagnosis Date  . CKD (chronic kidney disease)   . Diabetes mellitus without complication ([place])   . Frequent falls 07/22/2017  . Grief 09/08/2017  . Hematuria 01/20/2017   Noted on UA's in June & Oct 2018, possibly related to UTI [ ]  recheck UA at future visit  . Hypertension   . Neuropathy   . Tremor    Past Surgical History:  Procedure Laterality Date  .  BACK SURGERY    . CESAREAN SECTION    . FOOT SURGERY    . SHOULDER SURGERY       A IV Location/Drains/Wounds Patient Lines/Drains/Airways Status   Active Line/Drains/Airways    Name:   Placement date:   Placement time:   Site:   Days:   Peripheral IV 04/08/19 Left Antecubital   04/08/19    0429    Antecubital   1   Peripheral IV 04/08/19 Right Antecubital   04/08/19    0429    Antecubital   1   External Urinary Catheter   04/07/18    0930    --   367          Intake/Output Last 24 hours  Intake/Output Summary (Last 24 hours) at 04/09/2019 1615 Last data filed at 04/09/2019 1549 Gross per 24 hour  Intake 550 ml  Output 500 ml  Net 50 ml    Labs/Imaging Results for orders placed or performed during the hospital encounter of 03/29/2019 (from the past 48 hour(s))  CBC with Differential     Status: Abnormal   Collection Time: 04/14/2019 11:59 PM  Result Value Ref Range   WBC 11.4 (H) 4.0 - 10.5 K/uL   RBC 3.52 (L) 3.87 - 5.11 MIL/uL   Hemoglobin 9.4 (L) 12.0 - 15.0 g/dL   HCT 30.7 (L) 36.0 - 46.0 %   MCV 87.2 80.0 -  100.0 fL   MCH 26.7 26.0 - 34.0 pg   MCHC 30.6 30.0 - 36.0 g/dL   RDW 18.4 (H) 11.5 - 15.5 %   Platelets 177 150 - 400 K/uL   nRBC 0.0 0.0 - 0.2 %   Neutrophils Relative % 86 %   Neutro Abs 9.8 (H) 1.7 - 7.7 K/uL   Lymphocytes Relative 9 %   Lymphs Abs 1.1 0.7 - 4.0 K/uL   Monocytes Relative 4 %   Monocytes Absolute 0.5 0.1 - 1.0 K/uL   Eosinophils Relative 0 %   Eosinophils Absolute 0.0 0.0 - 0.5 K/uL   Basophils Relative 0 %   Basophils Absolute 0.0 0.0 - 0.1 K/uL   Immature Granulocytes 1 %   Abs Immature Granulocytes 0.06 0.00 - 0.07 K/uL    Comment: Performed at [facility] [address], [place], [place] [id number]  Basic metabolic panel     Status: Abnormal   Collection Time: 04/01/2019 11:59 PM  Result Value Ref Range   Sodium 139 135 - 145 mmol/L   Potassium 5.4 (H) 3.5 - 5.1 mmol/L   Chloride 115 (H) 98 - 111 mmol/L   CO2 11 (L) 22 -  32 mmol/L   Glucose, Bld 166 (H) 70 - 99 mg/dL   BUN 62 (H) 6 - 20 mg/dL   Creatinine, Ser 6.95 (H) 0.44 - 1.00 mg/dL   Calcium 8.2 (L) 8.9 - 10.3 mg/dL   GFR calc non Af Amer 6 (L) >60 mL/min   GFR calc Af Amer 7 (L) >60 mL/min   Anion gap 13 5 - 15    Comment: Performed at [facility] [address] [place], [place] [address]  Urinalysis, Routine w reflex microscopic     Status: Abnormal   Collection Time: 04/08/19  3:50 AM  Result Value Ref Range   Color, Urine AMBER (A) YELLOW    Comment: BIOCHEMICALS MAY BE AFFECTED BY COLOR   APPearance CLOUDY (A) CLEAR   Specific Gravity, Urine 1.020 1.005 - 1.030   pH 8.0 5.0 - 8.0   Glucose, UA 50 (A) NEGATIVE mg/dL   Hgb urine dipstick SMALL (A) NEGATIVE   Bilirubin Urine NEGATIVE NEGATIVE   Ketones, ur 5 (A) NEGATIVE mg/dL   Protein, ur >=300 (A) NEGATIVE mg/dL   Nitrite NEGATIVE NEGATIVE   Leukocytes,Ua MODERATE (A) NEGATIVE   RBC / HPF 6-10 0 - 5 RBC/hpf   WBC, UA >50 (H) 0 - 5 WBC/hpf   Bacteria, UA RARE (A) NONE SEEN   WBC Clumps PRESENT    Mucus PRESENT     Comment: Performed at [facility], [address], [place], [place] [address]  Urine culture     Status: Abnormal (Preliminary result)   Collection Time: 04/08/19  3:50 AM   Specimen: Urine, Catheterized  Result Value Ref Range   Specimen Description URINE, CATHETERIZED    Special Requests      NONE Performed at [facility] [address] [place],  [address]    Culture >=100,000 COLONIES/mL KLEBSIELLA PNEUMONIAE (A)    Report Status PENDING   Culture, blood (routine x 2)     Status: None (Preliminary result)   Collection Time: 04/08/19  4:05 AM   Specimen: BLOOD  Result Value Ref Range   Specimen Description BLOOD RIGHT ANTECUBITAL    Special Requests      BOTTLES DRAWN AEROBIC AND ANAEROBIC Blood Culture results may not be optimal due  to an excessive volume of blood received in culture bottles   Culture      NO GROWTH 1  DAY Performed at [facility] [address] [place], [place] [address]    Report Status PENDING   Culture, blood (routine x 2)     Status: None (Preliminary result)   Collection Time: 04/08/19  4:05 AM   Specimen: BLOOD  Result Value Ref Range   Specimen Description BLOOD LEFT ANTECUBITAL    Special Requests      BOTTLES DRAWN AEROBIC AND ANAEROBIC Blood Culture results may not be optimal due to an excessive volume of blood received in culture bottles   Culture  Setup Time      GRAM POSITIVE RODS AEROBIC BOTTLE ONLY CRITICAL RESULT CALLED TO, READ BACK BY AND VERIFIED WITH: Granger M BITONTI @2103  04/08/19 BY S GEZAHEGN    Culture      GRAM POSITIVE RODS IDENTIFICATION TO FOLLOW Performed at [facility], [place] [address], [place], [place] [address]    Report Status PENDING   Lactic acid, plasma     Status: None   Collection Time: 04/08/19  4:22 AM  Result Value Ref Range   Lactic Acid, Venous 1.0 0.5 - 1.9 mmol/L    Comment: Performed at [facility] [address] [place], [place] [address]  SARS CORONAVIRUS 2 (TAT 6-24 HRS) Nasopharyngeal Nasopharyngeal Swab     Status: Abnormal   Collection Time: 04/08/19  4:22 AM   Specimen: Nasopharyngeal Swab  Result Value Ref Range   SARS Coronavirus 2 POSITIVE (A) NEGATIVE    Comment: RESULT CALLED TO, READ BACK BY AND VERIFIED WITH: SHYNIECE WILSON RN.@1218  ON 12.20.2020 BY TCALDWELL MT. (NOTE) SARS-CoV-2 target nucleic acids are DETECTED. The SARS-CoV-2 RNA is generally detectable in upper and lower respiratory specimens during the acute phase of infection. Positive results are indicative of the presence of SARS-CoV-2 RNA. Clinical correlation with patient history and other diagnostic information is  necessary to determine patient infection status. Positive results do not rule out bacterial infection or co-infection with other viruses.  The expected result is Negative. Fact Sheet for  Patients: SugarRoll.be Fact Sheet for Healthcare Providers: https://www.woods-mathews.com/ This test is not yet approved or cleared by the [place] FDA and  has been authorized for detection and/or diagnosis of SARS-CoV-2 by FDA under an Emergency Use Authorization (EUA). This EUA will remain  in effect (meaning this test  can be used) for the duration of the COVID-19 declaration under Section 564(b)(1) of the Act, 21 U.S.C. section 360bbb-3(b)(1), unless the authorization is terminated or revoked sooner. Performed at [facility], [place] [address] [place], [place] [address]   Hepatic function panel     Status: Abnormal   Collection Time: 04/08/19  4:22 AM  Result Value Ref Range   Total Protein 6.2 (L) 6.5 - 8.1 g/dL   Albumin 1.8 (L) 3.5 - 5.0 g/dL   AST 121 (H) 15 - 41 U/L   ALT 23 0 - 44 U/L   Alkaline Phosphatase 61 38 - 126 U/L   Total Bilirubin 0.9 0.3 - 1.2 mg/dL   Bilirubin, Direct <0.1 0.0 - 0.2 mg/dL   Indirect Bilirubin NOT CALCULATED 0.3 - 0.9 mg/dL    Comment: Performed at [facility] [address], [place], [place] [address]  HIV Antibody (routine testing w rflx)     Status: None   Collection Time: 04/08/19  4:22 AM  Result Value Ref Range   HIV Screen 4th Generation wRfx NON REACTIVE NON REACTIVE    Comment: Performed at [facility], [place] [address] [place], [place] [address]  Brain natriuretic peptide     Status: Abnormal   Collection Time: 04/08/19  4:22 AM  Result Value Ref Range   B Natriuretic Peptide 1,163.8 (H) 0.0 - 100.0 pg/mL    Comment: Performed at [facility] [address], [place], [place] [address]  Hemoglobin A1c     Status: Abnormal   Collection Time: 04/08/19  4:22 AM  Result Value Ref Range   Hgb A1c MFr Bld 7.9 (H) 4.8 - 5.6 %    Comment: (NOTE) Pre diabetes:          5.7%-6.4% Diabetes:              >6.4% Glycemic control for   <7.0% adults with diabetes     Mean Plasma Glucose 180.03 mg/dL    Comment: Performed at [facility] [address], [place], [place] [address]  CBG monitoring, ED     Status: Abnormal   Collection Time: 04/08/19  9:47 AM  Result Value Ref Range   Glucose-Capillary 129 (H) 70 - 99 mg/dL   Comment 1 Notify RN    Comment 2 Document in Chart   CBG monitoring, ED     Status: None   Collection Time: 04/08/19 12:35 PM  Result Value Ref Range   Glucose-Capillary 86 70 - 99 mg/dL   Comment 1 Notify RN    Comment 2 Document in Chart   Basic metabolic panel     Status: Abnormal   Collection Time: 04/08/19 12:54 PM  Result Value Ref Range   Sodium 142 135 - 145 mmol/L   Potassium 5.4 (H) 3.5 - 5.1 mmol/L   Chloride 119 (H) 98 - 111 mmol/L   CO2 11 (L) 22 - 32 mmol/L   Glucose, Bld 92 70 - 99 mg/dL   BUN 68 (H) 6 - 20 mg/dL   Creatinine, Ser 6.90 (H) 0.44 - 1.00 mg/dL   Calcium 7.8 (L) 8.9 - 10.3 mg/dL   GFR calc non Af Amer 6 (L) >60 mL/min   GFR calc Af Amer 7 (L) >60 mL/min   Anion gap 12 5 - 15    Comment: Performed at [facility] [address] [place], [place] [address]  CBC     Status: Abnormal   Collection Time: 04/08/19 12:54 PM  Result Value Ref Range   WBC 10.3 4.0 - 10.5 K/uL   RBC 2.91 (L) 3.87 - 5.11 MIL/uL   Hemoglobin 7.9 (L) 12.0 - 15.0 g/dL   HCT 25.3 (L) 36.0 - 46.0 %   MCV 86.9 80.0 - 100.0 fL   MCH 27.1 26.0 - 34.0 pg   MCHC 31.2 30.0 - 36.0 g/dL   RDW 18.6 (H) 11.5 - 15.5 %   Platelets 160 150 - 400 K/uL   nRBC 0.4 (H) 0.0 - 0.2 %    Comment: Performed at [facility] [address], [place], [place] [address]  Ferritin     Status: Abnormal   Collection Time: 04/08/19 12:54 PM  Result Value Ref Range   Ferritin 2,725 (H) 11 - 307 ng/mL    Comment: Performed at [facility], [place] [address], [place], [place] [address]  Iron and TIBC     Status: Abnormal   Collection Time: 04/08/19 12:54  PM  Result Value Ref Range   Iron 20 (L) 28 - 170 ug/dL   TIBC 111 (L)  250 - 450 ug/dL   Saturation Ratios 18 10.4 - 31.8 %   UIBC 91 ug/dL    Comment: Performed at [facility] [address], [place], [place] [address]  C-reactive protein     Status: Abnormal   Collection Time: 04/08/19 12:54 PM  Result Value Ref Range   CRP 12.3 (H) <1.0 mg/dL    Comment: Performed at [facility], [place] [address] [place], [place] [address]  Lactate dehydrogenase     Status: Abnormal   Collection Time: 04/08/19 12:54 PM  Result Value Ref Range   LDH 678 (H) 98 - 192 U/L    Comment: Performed at [facility], [place] [address], [place], [place] [address]  Procalcitonin     Status: None   Collection Time: 04/08/19 12:54 PM  Result Value Ref Range   Procalcitonin 2.02 ng/mL    Comment:        Interpretation: PCT > 2 ng/mL: Systemic infection (sepsis) is likely, unless other causes are known. (NOTE)       Sepsis PCT Algorithm           Lower Respiratory Tract                                      Infection PCT Algorithm    ----------------------------     ----------------------------         PCT < 0.25 ng/mL                PCT < 0.10 ng/mL         Strongly encourage             Strongly discourage   discontinuation of antibiotics    initiation of antibiotics    ----------------------------     -----------------------------       PCT 0.25 - 0.50 ng/mL            PCT 0.10 - 0.25 ng/mL               OR       >80% decrease in PCT            Discourage initiation of                                            antibiotics      Encourage discontinuation           of antibiotics    ----------------------------     -----------------------------         PCT >= 0.50 ng/mL              PCT 0.26 - 0.50 ng/mL               AND       <80% decrease in PCT              Encourage initiation of                                             antibiotics  Encourage continuation           of antibiotics    ----------------------------      -----------------------------        PCT >= 0.50 ng/mL                  PCT > 0.50 ng/mL               AND         increase in PCT                  Strongly encourage                                      initiation of antibiotics    Strongly encourage escalation           of antibiotics                                     -----------------------------                                           PCT <= 0.25 ng/mL                                                 OR                                        > 80% decrease in PCT                                     Discontinue / Do not initiate                                             antibiotics Performed at [facility], [address] [place], [place] [address]   D-dimer, quantitative (not at [facility])     Status: Abnormal   Collection Time: 04/08/19  3:01 PM  Result Value Ref Range   D-Dimer, Quant 8.75 (H) 0.00 - 0.50 ug/mL-FEU    Comment: (NOTE) At the manufacturer cut-off of 0.50 ug/mL FEU, this assay has been documented to exclude PE with a sensitivity and negative predictive value of 97 to 99%.  At this time, this assay has not been approved by the FDA to exclude DVT/VTE. Results should be correlated with clinical presentation. Performed at [facility], [place] [address] [place], [place] [address]   CBG monitoring, ED     Status: Abnormal   Collection Time: 04/08/19  6:31 PM  Result Value Ref Range   Glucose-Capillary 124 (H) 70 - 99 mg/dL  Troponin I (High Sensitivity)     Status: Abnormal   Collection Time: 04/08/19  8:31 PM  Result Value Ref Range   Troponin I (High  Sensitivity) 1,990 (HH) <18 ng/L    Comment: CRITICAL RESULT CALLED TO, READ BACK BY AND VERIFIED WITH: Timmothy Euler [facility] 04/08/19 2137 WAYK Performed at [facility], [place] [address], [place], [place] [address]   Troponin I (High Sensitivity)     Status: Abnormal   Collection Time: 04/08/19 11:10 PM  Result Value Ref Range   Troponin I (High  Sensitivity) 1,879 (HH) <18 ng/L    Comment: CRITICAL VALUE NOTED.  VALUE IS CONSISTENT WITH PREVIOUSLY REPORTED AND CALLED VALUE. Performed at [facility], [place] [address], [place], [place] [address]   CBG monitoring, ED     Status: Abnormal   Collection Time: 04/08/19 11:20 PM  Result Value Ref Range   Glucose-Capillary 167 (H) 70 - 99 mg/dL  Troponin I (High Sensitivity)     Status: Abnormal   Collection Time: 04/09/19  1:05 AM  Result Value Ref Range   Troponin I (High Sensitivity) 2,156 (HH) <18 ng/L    Comment: CRITICAL VALUE NOTED.  VALUE IS CONSISTENT WITH PREVIOUSLY REPORTED AND CALLED VALUE. Performed at [facility], [place] [address], [place], [place] [address]   D-dimer, quantitative (not at [facility])     Status: Abnormal   Collection Time: 04/09/19  3:00 AM  Result Value Ref Range   D-Dimer, Quant 8.22 (H) 0.00 - 0.50 ug/mL-FEU    Comment: (NOTE) At the manufacturer cut-off of 0.50 ug/mL FEU, this assay has been documented to exclude PE with a sensitivity and negative predictive value of 97 to 99%.  At this time, this assay has not been approved by the FDA to exclude DVT/VTE. Results should be correlated with clinical presentation. Performed at [facility], [place] [address] [place], [place] [address]   C-reactive protein     Status: Abnormal   Collection Time: 04/09/19  3:00 AM  Result Value Ref Range   CRP 17.0 (H) <1.0 mg/dL    Comment: Performed at [facility] [address], [place], [place] [address]  Comprehensive metabolic panel     Status: Abnormal   Collection Time: 04/09/19  3:00 AM  Result Value Ref Range   Sodium 139 135 - 145 mmol/L   Potassium 5.5 (H) 3.5 - 5.1 mmol/L   Chloride 115 (H) 98 - 111 mmol/L   CO2 11 (L) 22 - 32 mmol/L   Glucose, Bld 176 (H) 70 - 99 mg/dL   BUN 75 (H) 6 - 20 mg/dL   Creatinine, Ser 7.26 (H) 0.44 - 1.00 mg/dL   Calcium 7.8 (L) 8.9 - 10.3 mg/dL   Total Protein 5.2 (L) 6.5 - 8.1 g/dL   Albumin 1.5 (L)  3.5 - 5.0 g/dL   AST 94 (H) 15 - 41 U/L   ALT 23 0 - 44 U/L   Alkaline Phosphatase 46 38 - 126 U/L   Total Bilirubin 0.7 0.3 - 1.2 mg/dL   GFR calc non Af Amer 6 (L) >60 mL/min   GFR calc Af Amer 6 (L) >60 mL/min   Anion gap 13 5 - 15    Comment: Performed at [facility], [place] [address] [place], [place] [address]  CBG monitoring, ED     Status: Abnormal   Collection Time: 04/09/19  8:11 AM  Result Value Ref Range   Glucose-Capillary 183 (H) 70 - 99 mg/dL   Comment 1 Notify RN    Comment 2 Document in Chart   Troponin I (High Sensitivity)  Status: Abnormal   Collection Time: 04/09/19  8:20 AM  Result Value Ref Range   Troponin I (High Sensitivity) 1,139 (HH) <18 ng/L    Comment: CRITICAL VALUE NOTED.  VALUE IS CONSISTENT WITH PREVIOUSLY REPORTED AND CALLED VALUE. (NOTE) Elevated high sensitivity troponin I (hsTnI) values and significant  changes across serial measurements may suggest ACS but many other  chronic and acute conditions are known to elevate hsTnI results.  Refer to the Links section for chest pain algorithms and additional  guidance. Performed at [facility], [place] [address], [place], [place] [address]   CBG monitoring, ED     Status: None   Collection Time: 04/09/19  1:06 PM  Result Value Ref Range   Glucose-Capillary 90 70 - 99 mg/dL   Comment 1 Notify RN    Comment 2 Document in Chart    US RENAL  Result Date: 04/08/2019 CLINICAL DATA:  58 year old female with acute kidney injury. Hematuria. EXAM: RENAL / URINARY TRACT ULTRASOUND COMPLETE COMPARISON:  11/23/2018 ultrasound FINDINGS: Right Kidney: Renal measurements: 10.6 x 4.1 x 6 cm = volume: 136 mL . Echogenicity within normal limits. No mass or hydronephrosis visualized. Left Kidney: Renal measurements: 8.7 x 4.7 x 5.5 cm = volume: 116 mL. Echogenicity within normal limits. No mass or hydronephrosis visualized. Bladder: Complicated fluid within the bladder noted. No other significant  abnormality. Other: Bilateral pleural effusions are present. IMPRESSION: 1. Unremarkable kidneys. 2. Complicated fluid within the bladder which may represent infection or blood given history of hematuria. 3. Bilateral pleural effusions. Electronically Signed   By: Margarette Canada M.D.   On: 04/08/2019 14:37   DG Chest Portable 1 View  Result Date: 04/08/2019 CLINICAL DATA:  Edema. Patient reports fever and body aches. Bilateral leg swelling. EXAM: PORTABLE CHEST 1 VIEW COMPARISON:  Chest radiograph 07/12/2017 FINDINGS: Borderline cardiomegaly, increased heart size from prior exam. There are small bilateral pleural effusions. Patchy and interstitial opacities within both lungs. No pneumothorax. Bones are under mineralized. No acute osseous abnormalities are seen. IMPRESSION: 1. Borderline cardiomegaly with bilateral pleural effusions. 2. Patchy and interstitial opacities, suggesting combination of pulmonary edema and pneumonia, including atypical viral infection. Electronically Signed   By: Keith Rake M.D.   On: 04/08/2019 03:23    Pending Labs Unresulted Labs (From admission, onward)    Start     Ordered   04/09/19 1400  Comprehensive metabolic panel  Once,   STAT    Comments: At 2pm    04/09/19 1016   04/09/19 0500  Comprehensive metabolic panel  Daily,   R     04/08/19 1307   04/08/19 1308  D-dimer, quantitative (not at [facility])  Daily,   R     04/08/19 1307   04/08/19 1308  C-reactive protein  Daily,   R     04/08/19 1307          Vitals/Pain Today's Vitals   04/09/19 1330 04/09/19 1400 04/09/19 1500 04/09/19 1515  BP:  (!) 167/77 (!) 148/79   Pulse: 92 94 92 90  Resp: 20 17 19 16   Temp:      TempSrc:      SpO2: 91% 93% (!) 89% 90%  Weight:      Height:      PainSc:        Isolation Precautions Airborne and Contact precautions  Medications Medications  acetaminophen (TYLENOL) tablet 1,000 mg (1,000 mg Oral Given 04/09/19 0614)  amLODipine (NORVASC) tablet 2.5 mg (has  no  administration in time range)  FLUoxetine (PROZAC) capsule 20 mg (20 mg Oral Given 04/09/19 1024)  ondansetron (ZOFRAN) tablet 4 mg (has no administration in time range)  polyethylene glycol (MIRALAX / GLYCOLAX) packet 17 g (has no administration in time range)  insulin aspart (novoLOG) injection 0-9 Units (0 Units Subcutaneous Not Given 04/09/19 1316)  traMADol (ULTRAM) tablet 50 mg (50 mg Oral Given 04/09/19 0614)  cefTRIAXone (ROCEPHIN) 1 g in sodium chloride 0.9 % 100 mL IVPB (0 g Intravenous Stopped 04/09/19 0814)  sodium chloride flush (NS) 0.9 % injection 3 mL (3 mLs Intravenous Given 04/09/19 1108)  dexamethasone (DECADRON) injection 6 mg (6 mg Intravenous Given 04/09/19 1338)  remdesivir 200 mg in sodium chloride 0.9% 250 mL IVPB (0 mg Intravenous Stopped 04/08/19 1718)    Followed by  remdesivir 100 mg in sodium chloride 0.9 % 100 mL IVPB (0 mg Intravenous Stopped 04/09/19 1107)  heparin injection 7,500 Units (7,500 Units Subcutaneous Given 04/09/19 1338)  furosemide (LASIX) 120 mg in dextrose 5 % 50 mL IVPB (0 mg Intravenous Stopped 04/09/19 1549)  sodium bicarbonate tablet 1,300 mg (has no administration in time range)  aspirin chewable tablet 81 mg (has no administration in time range)  0.9 %  sodium chloride infusion ( Intravenous New Bag/Given 04/09/19 1554)  furosemide (LASIX) injection 80 mg (80 mg Intravenous Given 04/08/19 0425)  acetaminophen (TYLENOL) tablet 650 mg (650 mg Oral Given 04/08/19 0551)  cefTRIAXone (ROCEPHIN) 1 g in sodium chloride 0.9 % 100 mL IVPB ( Intravenous Stopped 04/08/19 0621)  azithromycin (ZITHROMAX) 500 mg in sodium chloride 0.9 % 250 mL IVPB ( Intravenous Stopped 04/08/19 0705)  morphine 4 MG/ML injection 4 mg (4 mg Intravenous Given 04/08/19 0552)  sodium zirconium cyclosilicate (LOKELMA) packet 10 g (10 g Oral Given 04/08/19 1505)  sodium zirconium cyclosilicate (LOKELMA) packet 10 g (10 g Oral Given 04/09/19 0818)  chlorothiazide (DIURIL)  500 mg in dextrose 5 % 50 mL IVPB (0 mg Intravenous Stopped 04/09/19 1444)    Mobility walks High fall risk   Focused Assessments    R Recommendations: See Admitting Provider Note  Report given to:   Additional Notes:

## 2019-04-10 ENCOUNTER — Inpatient Hospital Stay (HOSPITAL_COMMUNITY): Payer: Medicaid Other

## 2019-04-10 DIAGNOSIS — R0902 Hypoxemia: Secondary | ICD-10-CM

## 2019-04-10 HISTORY — PX: IR US GUIDE VASC ACCESS RIGHT: IMG2390

## 2019-04-10 HISTORY — PX: IR FLUORO GUIDE CV LINE RIGHT: IMG2283

## 2019-04-10 LAB — COMPREHENSIVE METABOLIC PANEL
ALT: 19 U/L (ref 0–44)
AST: 74 U/L — ABNORMAL HIGH (ref 15–41)
Albumin: 1.3 g/dL — ABNORMAL LOW (ref 3.5–5.0)
Alkaline Phosphatase: 42 U/L (ref 38–126)
Anion gap: 17 — ABNORMAL HIGH (ref 5–15)
BUN: 93 mg/dL — ABNORMAL HIGH (ref 6–20)
CO2: 10 mmol/L — ABNORMAL LOW (ref 22–32)
Calcium: 7.4 mg/dL — ABNORMAL LOW (ref 8.9–10.3)
Chloride: 113 mmol/L — ABNORMAL HIGH (ref 98–111)
Creatinine, Ser: 8.33 mg/dL — ABNORMAL HIGH (ref 0.44–1.00)
GFR calc Af Amer: 6 mL/min — ABNORMAL LOW (ref >60)
GFR calc non Af Amer: 5 mL/min — ABNORMAL LOW (ref >60)
Glucose, Bld: 221 mg/dL — ABNORMAL HIGH (ref 70–99)
Potassium: 5.6 mmol/L — ABNORMAL HIGH (ref 3.5–5.1)
Sodium: 140 mmol/L (ref 135–145)
Total Bilirubin: 0.9 mg/dL (ref 0.3–1.2)
Total Protein: 5 g/dL — ABNORMAL LOW (ref 6.5–8.1)

## 2019-04-10 LAB — PROTIME-INR
INR: 1.2 (ref 0.8–1.2)
Prothrombin Time: 15 seconds (ref 11.4–15.2)

## 2019-04-10 LAB — CULTURE, BLOOD (ROUTINE X 2)

## 2019-04-10 LAB — URINE CULTURE: Culture: >=100000 — AB

## 2019-04-10 LAB — GLUCOSE, CAPILLARY
Glucose-Capillary: 215 mg/dL — ABNORMAL HIGH (ref 70–99)
Glucose-Capillary: 211 mg/dL — ABNORMAL HIGH (ref 70–99)
Glucose-Capillary: 160 mg/dL — ABNORMAL HIGH (ref 70–99)
Glucose-Capillary: 148 mg/dL — ABNORMAL HIGH (ref 70–99)
Glucose-Capillary: 151 mg/dL — ABNORMAL HIGH (ref 70–99)

## 2019-04-10 LAB — C-REACTIVE PROTEIN: CRP: 20.3 mg/dL — ABNORMAL HIGH (ref <1.0)

## 2019-04-10 LAB — HEPATITIS B SURFACE ANTIGEN: Hepatitis B Surface Ag: NONREACTIVE

## 2019-04-10 LAB — D-DIMER, QUANTITATIVE: D-Dimer, Quant: 7.92 ug/mL-FEU — ABNORMAL HIGH (ref 0.00–0.50)

## 2019-04-10 MED ORDER — FENTANYL CITRATE (PF) 100 MCG/2ML IJ SOLN
INTRAMUSCULAR | Status: AC
  Filled 2019-04-10: qty 2

## 2019-04-10 MED ORDER — HEPARIN SODIUM (PORCINE) 5000 UNIT/ML IJ SOLN
7500.0000 [IU] | Freq: Three times a day (TID) | INTRAMUSCULAR | Status: DC
  Administered 2019-04-11: 7500 [IU] via SUBCUTANEOUS
  Filled 2019-04-10: qty 2

## 2019-04-10 MED ORDER — MIDAZOLAM HCL 2 MG/2ML IJ SOLN
INTRAMUSCULAR | Status: AC
  Filled 2019-04-10: qty 2

## 2019-04-10 MED ORDER — LIDOCAINE HCL 1 % IJ SOLN
INTRAMUSCULAR | Status: AC | PRN
  Administered 2019-04-10: 5 mL

## 2019-04-10 MED ORDER — HEPARIN SODIUM (PORCINE) 1000 UNIT/ML IJ SOLN
INTRAMUSCULAR | Status: AC
  Filled 2019-04-10: qty 1

## 2019-04-10 MED ORDER — CEFAZOLIN SODIUM-DEXTROSE 2-4 GM/100ML-% IV SOLN
INTRAVENOUS | Status: AC
  Administered 2019-04-10: 2000 mg
  Filled 2019-04-10: qty 100

## 2019-04-10 MED ORDER — HEPARIN SODIUM (PORCINE) 1000 UNIT/ML IJ SOLN: 1000.0000 [IU] | INTRAMUSCULAR | Status: DC | PRN

## 2019-04-10 MED ORDER — MIDAZOLAM HCL 2 MG/2ML IJ SOLN
INTRAMUSCULAR | Status: AC | PRN
  Administered 2019-04-10 (×2): 0.5 mg via INTRAVENOUS

## 2019-04-10 MED ORDER — METOPROLOL TARTRATE 12.5 MG HALF TABLET
12.5000 mg | ORAL_TABLET | Freq: Two times a day (BID) | ORAL | Status: DC
  Administered 2019-04-10 – 2019-04-11 (×3): 12.5 mg via ORAL
  Filled 2019-04-10 (×3): qty 1

## 2019-04-10 MED ORDER — CEFAZOLIN SODIUM-DEXTROSE 1-4 GM/50ML-% IV SOLN
1.0000 g | Freq: Once | INTRAVENOUS | Status: DC
  Filled 2019-04-10: qty 50

## 2019-04-10 MED ORDER — SODIUM ZIRCONIUM CYCLOSILICATE 10 G PO PACK
10.0000 g | PACK | Freq: Once | ORAL | Status: AC
  Administered 2019-04-10: 10 g via ORAL
  Filled 2019-04-10: qty 1

## 2019-04-10 MED ORDER — SODIUM ZIRCONIUM CYCLOSILICATE 10 G PO PACK
10.0000 g | PACK | Freq: Once | ORAL | Status: DC
  Filled 2019-04-10: qty 1

## 2019-04-10 MED ORDER — LIDOCAINE HCL 1 % IJ SOLN
INTRAMUSCULAR | Status: AC
  Filled 2019-04-10: qty 20

## 2019-04-10 MED ORDER — FENTANYL CITRATE (PF) 100 MCG/2ML IJ SOLN
INTRAMUSCULAR | Status: AC | PRN
  Administered 2019-04-10: 50 ug via INTRAVENOUS

## 2019-04-10 MED ORDER — HEPARIN SODIUM (PORCINE) 1000 UNIT/ML IJ SOLN
INTRAMUSCULAR | Status: AC
  Administered 2019-04-11: 3800 [IU] via INTRAVENOUS_CENTRAL
  Filled 2019-04-10: qty 4

## 2019-04-10 MED ORDER — HEPARIN SODIUM (PORCINE) 1000 UNIT/ML IJ SOLN
INTRAMUSCULAR | Status: AC | PRN
  Administered 2019-04-10: 3800 [IU] via INTRAVENOUS

## 2019-04-10 MED ORDER — DEXTROSE 5 % IV SOLN
500.0000 mg | Freq: Two times a day (BID) | INTRAVENOUS | Status: AC
  Administered 2019-04-10 (×2): 500 mg via INTRAVENOUS
  Filled 2019-04-10 (×2): qty 500

## 2019-04-10 MED ORDER — CHLORHEXIDINE GLUCONATE CLOTH 2 % EX PADS
6.0000 | MEDICATED_PAD | Freq: Every day | CUTANEOUS | Status: DC
  Administered 2019-04-11: 6 via TOPICAL

## 2019-04-10 NOTE — Progress Notes (Addendum)
[facility] NEPHROLOGY PROGRESS NOTE  Assessment/ Plan: Pt is a 58 y.o. yo female  with history of poorly controlled DM, HTN, HLD, nephrotic syndrome with biopsy-proven FSGS and diabetic nephropathy s/p prograf and rituximab, CKD stage IV with creatinine level somewhere between 3-4 at baseline, presented with worsening leg edema, Covid 19+, seen as a consultation for AKI on CKD.  #AKI on CKD stage IV with fluid overload: She has biopsy-proven FSGS and diabetic changes and follows at [facility].  She had received Prograf and subsequently 2 doses of rituximab.  She now has UTI and COVID-19 positive. Kidney US: No hydronephrosis. She is clearly not responding with IV diuretics.  Renal parameters are worsening including hyperkalemia, acidosis and uremia.  I have discussed with her about those finding and need for initiating dialysis after catheter placement.  Patient agreed. I consulted IR for placement of tunneled catheter. Plan to HD either today or tomorrow depending on when she gets catheter.   #Fluid overload/acute pulmonary edema: Refractory to diuretics therefore restarting dialysis.  #Metabolic acidosis: No improvement with oral sodium bicarbonate.  Starting renal replacement therapy as discussed above.  #Hyperkalemia: I will order a dose of Lokelma.  It is not known when she gets dialysis today.  #COVID-19 positive: Receiving remdesivir and Decadron by primary team.  #UTI: On ceftriaxone.  Follow-up culture result.  Addendum 2:28 PM: patient became more lethargic ? Uremic. The IR unable to get consent for [facility] placement. We tried to call the contact person who is her relative with no answer. At this time I signed consent for emergent nature of procedure and need for dialysis.   Subjective: Chart and lab results reviewed.urine output is only recorded for 475 cc.  She reports weakness and some nausea.  No new event.  Objective Vital signs in last 24 hours: Vitals:    04/09/19 1900 04/09/19 2000 04/09/19 2103 04/10/19 0600  BP: 140/74 139/77 (!) 157/75   Pulse: 86 88 96   Resp: 19 19 20    Temp:   98.5 F (36.9 C) 99.8 F (37.7 C)  TempSrc:   Oral Oral  SpO2: 93% 90% (!) 85%   Weight:      Height:       Weight change:   Intake/Output Summary (Last 24 hours) at 04/10/2019 1309 Last data filed at 04/10/2019 0839 Gross per 24 hour  Intake 234.31 ml  Output 475 ml  Net -240.69 ml       Labs: Basic Metabolic Panel: Recent Labs  Lab 04/09/19 0300 04/09/19 1558 04/10/19 0406  NA 139 139 140  K 5.5* 5.3* 5.6*  CL 115* 114* 113*  CO2 11* 11* 10*  GLUCOSE 176* 129* 221*  BUN 75* 80* 93*  CREATININE 7.26* 7.51* 8.33*  CALCIUM 7.8* 7.5* 7.4*   Liver Function Tests: Recent Labs  Lab 04/09/19 0300 04/09/19 1558 04/10/19 0406  AST 94* 85* 74*  ALT 23 21 19   ALKPHOS 46 41 42  BILITOT 0.7 0.7 0.9  PROT 5.2* 5.2* 5.0*  ALBUMIN 1.5* 1.4* 1.3*   No results for input(s): LIPASE, AMYLASE in the last 168 hours. No results for input(s): AMMONIA in the last 168 hours. CBC: Recent Labs  Lab 04/06/2019 2359 04/08/19 1254  WBC 11.4* 10.3  NEUTROABS 9.8*  --   HGB 9.4* 7.9*  HCT 30.7* 25.3*  MCV 87.2 86.9  PLT 177 160   Cardiac Enzymes: No results for input(s): CKTOTAL, CKMB, CKMBINDEX, TROPONINI in the last 168 hours. CBG: Recent Labs  Lab 04/09/19 1306 04/09/19 1708 04/10/19 0412 04/10/19 0819 04/10/19 1142  GLUCAP 90 124* 215* 211* 160*    Iron Studies:  Recent Labs    04/08/19 1254  IRON 20*  TIBC 111*  FERRITIN 2,725*   Studies/Results: US RENAL  Result Date: 04/08/2019 CLINICAL DATA:  58 year old female with acute kidney injury. Hematuria. EXAM: RENAL / URINARY TRACT ULTRASOUND COMPLETE COMPARISON:  11/23/2018 ultrasound FINDINGS: Right Kidney: Renal measurements: 10.6 x 4.1 x 6 cm = volume: 136 mL . Echogenicity within normal limits. No mass or hydronephrosis visualized. Left Kidney: Renal measurements: 8.7 x 4.7  x 5.5 cm = volume: 116 mL. Echogenicity within normal limits. No mass or hydronephrosis visualized. Bladder: Complicated fluid within the bladder noted. No other significant abnormality. Other: Bilateral pleural effusions are present. IMPRESSION: 1. Unremarkable kidneys. 2. Complicated fluid within the bladder which may represent infection or blood given history of hematuria. 3. Bilateral pleural effusions. Electronically Signed   By: Margarette Canada M.D.   On: 04/08/2019 14:37    Medications: Infusions: . cefTRIAXone (ROCEPHIN)  IV 1 g (04/10/19 0537)  . chlorothiazide (DIURIL) IV    . furosemide 120 mg (04/10/19 0536)  . remdesivir 100 mg in NS 100 mL 100 mg (04/10/19 0836)    Scheduled Medications: . aspirin  81 mg Oral Daily  . [START ON 04/11/2019] Chlorhexidine Gluconate Cloth  6 each Topical Q0600  . dexamethasone (DECADRON) injection  6 mg Intravenous Q24H  . FLUoxetine  20 mg Oral Daily  . heparin  7,500 Units Subcutaneous Q8H  . insulin aspart  0-9 Units Subcutaneous TID WC  . metoprolol tartrate  12.5 mg Oral BID  . sodium bicarbonate  1,300 mg Oral TID  . sodium chloride flush  3 mL Intravenous Q12H  . sodium zirconium cyclosilicate  10 g Oral Once    have reviewed scheduled and prn medications.  Physical Exam: Patient was not examined directly because of COVID-19 pneumonia in order to lower exposure and to preserve PPE.  I have discussed with the patient, her nurse and primary team and reviewed the pertinent physical examination.    Josi Roediger Prasad Jaeda Bruso 04/10/2019,1:09 PM  LOS: 2 days  Pager: ID:[phone]

## 2019-04-10 NOTE — Progress Notes (Addendum)
Progress Note  Patient Name: Andrea Glenn Date of Encounter: 04/10/2019  Primary Cardiologist:   No primary care provider on file.   Subjective   Patient is somnolent.  On high flow O2 with sats in the low 90s.  Chest pain to palpation but no chest pressure.    Inpatient Medications    Scheduled Meds: . aspirin  81 mg Oral Daily  . dexamethasone (DECADRON) injection  6 mg Intravenous Q24H  . FLUoxetine  20 mg Oral Daily  . heparin  7,500 Units Subcutaneous Q8H  . insulin aspart  0-9 Units Subcutaneous TID WC  . sodium bicarbonate  1,300 mg Oral TID  . sodium chloride flush  3 mL Intravenous Q12H  . sodium zirconium cyclosilicate  10 g Oral Once   Continuous Infusions: . cefTRIAXone (ROCEPHIN)  IV 1 g (04/10/19 0537)  . furosemide 120 mg (04/10/19 0536)  . remdesivir 100 mg in NS 100 mL 100 mg (04/10/19 0836)   PRN Meds: acetaminophen, amLODipine, ondansetron, polyethylene glycol, traMADol   Vital Signs    Vitals:   04/09/19 1900 04/09/19 2000 04/09/19 2103 04/10/19 0600  BP: 140/74 139/77 (!) 157/75   Pulse: 86 88 96   Resp: 19 19 20    Temp:   98.5 F (36.9 C) 99.8 F (37.7 C)  TempSrc:   Oral Oral  SpO2: 93% 90% (!) 85%   Weight:      Height:        Intake/Output Summary (Last 24 hours) at 04/10/2019 1216 Last data filed at 04/10/2019 0839 Gross per 24 hour  Intake 234.31 ml  Output 475 ml  Net -240.69 ml   Filed Weights   04/08/19 0427  Weight: 64.4 kg    Telemetry    NSR - Personally Reviewed  ECG    NA - Personally Reviewed  Physical Exam   GEN:   Acutely ill appearing. Neck: No  JVD Cardiac: RRR, no murmurs, rubs, or gallops.  Respiratory:   Decreased breath sounds bilaterally.  GI: Soft, nontender, non-distended  MS: No edema; No deformity. Neuro:  Nonfocal  Psych: Normal affect   Labs    Chemistry Recent Labs  Lab 04/09/19 0300 04/09/19 1558 04/10/19 0406  NA 139 139 140  K 5.5* 5.3* 5.6*  CL 115* 114* 113*  CO2  11* 11* 10*  GLUCOSE 176* 129* 221*  BUN 75* 80* 93*  CREATININE 7.26* 7.51* 8.33*  CALCIUM 7.8* 7.5* 7.4*  PROT 5.2* 5.2* 5.0*  ALBUMIN 1.5* 1.4* 1.3*  AST 94* 85* 74*  ALT 23 21 19   ALKPHOS 46 41 42  BILITOT 0.7 0.7 0.9  GFRNONAA 6* 5* 5*  GFRAA 6* 6* 6*  ANIONGAP 13 14 17*     Hematology Recent Labs  Lab 04/12/2019 2359 04/08/19 1254  WBC 11.4* 10.3  RBC 3.52* 2.91*  HGB 9.4* 7.9*  HCT 30.7* 25.3*  MCV 87.2 86.9  MCH 26.7 27.1  MCHC 30.6 31.2  RDW 18.4* 18.6*  PLT 177 160    Cardiac EnzymesNo results for input(s): TROPONINI in the last 168 hours. No results for input(s): TROPIPOC in the last 168 hours.   BNP Recent Labs  Lab 04/08/19 0422  BNP 1,163.8*     DDimer  Recent Labs  Lab 04/08/19 1501 04/09/19 0300 04/10/19 0406  DDIMER 8.75* 8.22* 7.92*     Radiology    US RENAL  Result Date: 04/08/2019 CLINICAL DATA:  58 year old female with acute kidney injury. Hematuria. EXAM: RENAL /  URINARY TRACT ULTRASOUND COMPLETE COMPARISON:  11/23/2018 ultrasound FINDINGS: Right Kidney: Renal measurements: 10.6 x 4.1 x 6 cm = volume: 136 mL . Echogenicity within normal limits. No mass or hydronephrosis visualized. Left Kidney: Renal measurements: 8.7 x 4.7 x 5.5 cm = volume: 116 mL. Echogenicity within normal limits. No mass or hydronephrosis visualized. Bladder: Complicated fluid within the bladder noted. No other significant abnormality. Other: Bilateral pleural effusions are present. IMPRESSION: 1. Unremarkable kidneys. 2. Complicated fluid within the bladder which may represent infection or blood given history of hematuria. 3. Bilateral pleural effusions. Electronically Signed   By: Margarette Canada M.D.   On: 04/08/2019 14:37    Cardiac Studies   Echo pending.   Patient Profile     58 y.o. female  with a hx of HTN, HLD, poorly controled DM with neurophathy, FSGS with nephrotic syndrome, tobacco smoking and CKD IV who is being seen for the evaluation of elevated  troponin at the request of Dr. Nori Riis.   She has COVID 19.    Assessment & Plan    ELEVATED TROPONIN:  Might be demand ischemia.  No acute EKG changes.  However, she will need an echo.  For now continue ASA.  Start low dose beta blocker and stop the Norvasc.  Check echocardiogram.    ACUTE ON CHRONIC RENAL INSUFFICIENCY:  Diuretic increased per renal.  If now response she will need dialysis for volume management and hyperkalemia.    For questions or updates, please contact [place] Please consult [email or web address] for contact info under Cardiology/STEMI.   Signed, Minus Breeding, MD  04/10/2019, 12:16 PM

## 2019-04-10 NOTE — Progress Notes (Signed)
Night IR MD verified placement of HD catheter tip in the right atrium. Nephrologist said it is ok to use HD catheter.

## 2019-04-10 NOTE — Sedation Documentation (Signed)
ETCO2 unable to be monitered due to patient being on NRB

## 2019-04-10 NOTE — Progress Notes (Signed)
Family Medicine Teaching Service Daily Progress Note Intern Pager: [phone]  Patient name: Andrea Glenn Medical record number: [id number] Date of birth: 1960/12/20 Age: 58 y.o. Gender: female  Primary Care Provider: Guadalupe Dawn, MD Consultants: Nephrololgy, wound care Code Status: FULL  Pt Overview and Major Events to Date:  12/20 Admitted to [facility] 12/22- on HFNC   Assessment and Plan:  Andrea Glenn is a 58 y.o. female presenting with weakness now with sepsis due to [place]. PMH is significant for focal segmental glomerulonephritis, CKD, DM, HTN, venous stasis, restless leg syndrome.  Sepsis 2/2 COVID Several days with body aches, fever, nausea and cough. COVID + on admission. CRP 17 up to 20, dimer 8.22. Temp today is 99.8 and transitioned to HFNC due to desats into 80s. Chest x-ray is notable for bilateral pleural effusions with patchy opacities consistent with pulmonary edema and/or atypical pneumonia. Blood culture w/ gram + rods. Urine culture w/ klebsiella.   -Continue dexamethasone day 3 of 10 and remdesivir day 3 of 5 -Continue ceftriaxone setting of possible UTI; switching to PO when necessary cefdinir -Assess for need to escalate abx  AKI on CKD, Hx of FSGN  UTI Her medical history is significant for FSGN, hypertension, diabetes.  She follows outpatient with nephrology and has previously been treated with tacrolimus which has been discontinued and replaced more recently with rituximab infusions.  In the past year, her baseline creatinine has increased from 2 to 4 and she now is admitted to the hospital with a creatinine close to 7. Renal U/s: Unremarkable kidneys. Complicated fluid within the bladder which may represent infection or blood given history of hematuria. Bilateral pleural effusions. -Nephrology consulted, planning to initiate HD pending placement of catheter with IR  -Strict I's and O's -Renal diet following placement of catheter  - continue CTX as  above  Hyperkalemia Potassium to 5.4 on admission. Today 5.6 from 5.3 previously.  Likely secondary to AKI.  No peak T waves, widened QRS complexes flattened T waves noted on initial EKG. Trops 1,990>1,879>2,156>1139.  -Follow-up nephrology recommendations -Lokelma x1, 10 grams   Volume overload Most likely related to her acute kidney failure and sepsis.  Low suspicion for cardiac etiology. Her physical exam was remarkable for a 2/6 systolic murmur at the right sternal border in addition to lower extremity edema and rales on pulmonary auscultation. BNP 1,163.8 consider ECHO. UOP 0.5 L -Cardiology consulted, recommending echo  -started Metoprolol 12.5mg  BID  -Lasix 120mg  q8h -Appreciate nephro recs appreciated  Lower extremity swelling with pain History and physical consistent with venous stasis.  This does seem to have worsened in the past week or so.  Worsening is most likely related to infection and worsening kidney injury. -Consider gabapentin for worsening pain control -Monitor lower extremity swelling, lasix as above  AST elevation, downtrending Limited to 121 on admission.  Unclear etiology.  Possibly secondary to sepsis. AST down to 85 from 94 today -Repeat LFTs prior to discharge  Diabetes, type II with LE neuropathy Last A1c 5.9 (11/22/18), home medication includes dulaglutide once weekly. CBGs 124-167, 3 units of fast acting overnight.  -SSI sensitive -Monitor CBGs  Anemia, normocytic  Hemoglobin 9.4 on admission decreased from 11.62 months ago. Hgb 7.9 today. Iron 20 TIBC 111 Ferritin 2,725 -Continue to monitor on daily CBC -Transfuse when necessary  HTN Mildly hypertensive with systolic pressures ranging from 142-161.  Home medication included amlodipine 10 mg daily prior to this admission.  No concern for hypotension at this time. -discontinue  Norvasc, start Metoprolol 12.5mg  twice daily   MDD Currently taking fluoxetine. -Continue fluoxetine  Nicotine use  disorder Current smoker.  No desire for nicotine patch at this time. -Order nicotine patch if desired  FEN/GI: general diet Prophylaxis: heparin  Disposition: Med-surg, COVID floor  Subjective:  [name] states that she feels minimally improved from the day before but does feel that she can breathe more comfortably on the HFNC.   Objective: Temp:  [98.5 F (36.9 C)-99.8 F (37.7 C)] 99.8 F (37.7 C) (12/22 0600) Pulse Rate:  [85-96] 96 (12/21 2103) Resp:  [16-22] 20 (12/21 2103) BP: (133-157)/(69-79) 157/75 (12/21 2103) SpO2:  [85 %-93 %] 85 % (12/21 2103)   Physical Exam:  General: uncomfortable appearing female lying in bed, does not appeart to be in respiratory distress but requires multiple attempts to answer questions but does answer questions appropriately  Cardiac: RRR, normal heart sounds, no murmurs Respiratory: Diffuse rales, normal effort Extremities: peripheral LE edema bilaterally 1+, edematous hands, no cyanosis. Skin: Warm and dry, no rashes noted Neuro: alert and oriented, no focal deficits Psych: calm, cooperative with exam    Laboratory: Recent Labs  Lab 03/31/2019 2359 04/08/19 1254  WBC 11.4* 10.3  HGB 9.4* 7.9*  HCT 30.7* 25.3*  PLT 177 160   Recent Labs  Lab 04/09/19 0300 04/09/19 1558 04/10/19 0406  NA 139 139 140  K 5.5* 5.3* 5.6*  CL 115* 114* 113*  CO2 11* 11* 10*  BUN 75* 80* 93*  CREATININE 7.26* 7.51* 8.33*  CALCIUM 7.8* 7.5* 7.4*  PROT 5.2* 5.2* 5.0*  BILITOT 0.7 0.7 0.9  ALKPHOS 46 41 42  ALT 23 21 19   AST 94* 85* 74*  GLUCOSE 176* 129* 221*      Imaging/Diagnostic Tests: EXAM: PORTABLE CHEST 1 VIEW COMPARISON:  Chest radiograph 07/12/2017 IMPRESSION: 1. Borderline cardiomegaly with bilateral pleural effusions. 2. Patchy and interstitial opacities, suggesting combination of pulmonary edema and pneumonia, including atypical viral infection.  EXAM: RENAL / URINARY TRACT ULTRASOUND COMPLETE COMPARISON:   11/23/2018 ultrasound IMPRESSION: 1. Unremarkable kidneys. 2. Complicated fluid within the bladder which may represent infection or blood given history of hematuria. 3. Bilateral pleural effusions.   Stark Klein, MD 04/10/2019, 2:29 PM PGY-1, [place] Intern pager: [phone], text pages welcome

## 2019-04-10 NOTE — Progress Notes (Signed)
Chief Complaint: Patient was seen in consultation today for HD cath placement  Referring Physician(s): Dr. Carolin Sicks  Supervising Physician: Aletta Edouard  Patient Status: [facility] - In-pt  History of Present Illness: Andrea Glenn is a 58 y.o. female admitted with AKI on CKD with volume overload. She is also COVID+. She is not responding to diuresis and is now in need of HD. She has become more uremic. IR is asked to place tunneled HD cath PMHx, meds, labs, imaging, allergies reviewed. Has been NPO today as directed. She is very somnolent and not awake or alert at the time of my conversation.     Past Medical History:  Diagnosis Date  . CKD (chronic kidney disease)   . Diabetes mellitus without complication ([place])   . Frequent falls 07/22/2017  . Grief 09/08/2017  . Hematuria 01/20/2017   Noted on UA's in June & Oct 2018, possibly related to UTI [ ]  recheck UA at future visit  . Hypertension   . Neuropathy   . Tremor     Past Surgical History:  Procedure Laterality Date  . BACK SURGERY    . CESAREAN SECTION    . FOOT SURGERY    . SHOULDER SURGERY      Allergies: Atorvastatin, Statins, Amitriptyline, Metformin and related, Adhesive [tape], Gabapentin, and Latex  Medications:  Current Facility-Administered Medications:  .  acetaminophen (TYLENOL) tablet 1,000 mg, 1,000 mg, Oral, Q6H PRN, Matilde Haymaker, MD, 1,000 mg at 04/09/19 [id number] .  aspirin chewable tablet 81 mg, 81 mg, Oral, Daily, Bhagat, Bhavinkumar, PA .  cefTRIAXone (ROCEPHIN) 1 g in sodium chloride 0.9 % 100 mL IVPB, 1 g, Intravenous, Q24H, Matilde Haymaker, MD, Last Rate: 200 mL/hr at 04/10/19 0537, 1 g at 04/10/19 0537 .  [START ON 04/11/2019] Chlorhexidine Gluconate Cloth 2 % PADS 6 each, 6 each, Topical, Q0600, Rosita Fire, MD .  chlorothiazide (DIURIL) 500 mg in dextrose 5 % 50 mL IVPB, 500 mg, Intravenous, Q12H, Rosita Fire, MD .  dexamethasone (DECADRON) injection 6 mg, 6 mg,  Intravenous, Q24H, Abraham, Sherin, DO, 6 mg at 04/10/19 1412 .  FLUoxetine (PROZAC) capsule 20 mg, 20 mg, Oral, Daily, Matilde Haymaker, MD, 20 mg at 04/09/19 1024 .  furosemide (LASIX) 120 mg in dextrose 5 % 50 mL IVPB, 120 mg, Intravenous, Q8H, Rosita Fire, MD, Last Rate: 62 mL/hr at 04/10/19 1429, 120 mg at 04/10/19 1429 .  [START ON 04/11/2019] heparin injection 7,500 Units, 7,500 Units, Subcutaneous, Q8H, Gretel Cantu, PA-C .  insulin aspart (novoLOG) injection 0-9 Units, 0-9 Units, Subcutaneous, TID WC, Matilde Haymaker, MD, 2 Units at 04/10/19 1236 .  metoprolol tartrate (LOPRESSOR) tablet 12.5 mg, 12.5 mg, Oral, BID, Minus Breeding, MD, 12.5 mg at 04/10/19 1424 .  ondansetron (ZOFRAN) tablet 4 mg, 4 mg, Oral, Daily PRN, Matilde Haymaker, MD .  polyethylene glycol (MIRALAX / GLYCOLAX) packet 17 g, 17 g, Oral, Daily PRN, Matilde Haymaker, MD .  Margrett Rud remdesivir 200 mg in sodium chloride 0.9% 250 mL IVPB, 200 mg, Intravenous, Once, Stopped at 04/08/19 1718 **FOLLOWED BY** remdesivir 100 mg in sodium chloride 0.9 % 100 mL IVPB, 100 mg, Intravenous, Daily, Romona Curls, [facility], Last Rate: 200 mL/hr at 04/10/19 0836, 100 mg at 04/10/19 0836 .  sodium bicarbonate tablet 1,300 mg, 1,300 mg, Oral, TID, Rosita Fire, MD, 1,300 mg at 04/09/19 2017 .  sodium chloride flush (NS) 0.9 % injection 3 mL, 3 mL, Intravenous, Q12H, Abraham, Sherin, DO, 3 mL at  04/10/19 1413 .  traMADol (ULTRAM) tablet 50 mg, 50 mg, Oral, Q6H PRN, Matilde Haymaker, MD, 50 mg at 04/09/19 2219    Family History  Problem Relation Age of Onset  . Kidney failure Mother   . Diabetes Mother   . Thyroid disease Mother   . Hypertension Mother   . Heart failure Mother   . Kidney failure Father   . Cancer Sister   . Kidney failure Sister   . Stroke Brother   . Cancer Other   . Kidney failure Maternal Grandmother   . Kidney failure Maternal Grandfather   . Kidney failure Sister   . Heart attack Sister     Social  History   Socioeconomic History  . Marital status: Single    Spouse name: Not on file  . Number of children: Not on file  . Years of education: Not on file  . Highest education level: Not on file  Occupational History  . Not on file  Tobacco Use  . Smoking status: Current Every Day Smoker    Packs/day: 0.10    Years: 20.00    Pack years: 2.00    Types: Cigarettes    Start date: 26  . Smokeless tobacco: Never Used  . Tobacco comment: working on quitting - 2 per day - 2.26.19  Substance and Sexual Activity  . Alcohol use: No  . Drug use: No  . Sexual activity: Not on file  Other Topics Concern  . Not on file  Social History Narrative   Single. Lives with her son. Current every day smoker.    Social Determinants of Health   Financial Resource Strain:   . Difficulty of Paying Living Expenses: Not on file  Food Insecurity:   . Worried About Charity fundraiser in the Last Year: Not on file  . Ran Out of Food in the Last Year: Not on file  Transportation Needs:   . Lack of Transportation (Medical): Not on file  . Lack of Transportation (Non-Medical): Not on file  Physical Activity:   . Days of Exercise per Week: Not on file  . Minutes of Exercise per Session: Not on file  Stress:   . Feeling of Stress : Not on file  Social Connections:   . Frequency of Communication with Friends and Family: Not on file  . Frequency of Social Gatherings with Friends and Family: Not on file  . Attends Religious Services: Not on file  . Active Member of Clubs or Organizations: Not on file  . Attends Archivist Meetings: Not on file  . Marital Status: Not on file     Review of Systems: A 12 point ROS discussed and pertinent positives are indicated in the HPI above.  All other systems are negative.  Review of Systems  Vital Signs: BP (!) 157/75 (BP Location: Right Arm)   Pulse 96   Temp 99.8 F (37.7 C) (Oral)   Resp 20   Ht 5\' 4"  (1.626 m)   Wt 64.4 kg   SpO2 (!) 85%    BMI 24.37 kg/m   Physical Exam Constitutional:      General: She is not in acute distress.    Appearance: She is ill-appearing.  Cardiovascular:     Rate and Rhythm: Normal rate and regular rhythm.     Heart sounds: Normal heart sounds.  Pulmonary:     Effort: Pulmonary effort is normal. No respiratory distress.     Breath sounds: Normal breath sounds.  Skin:    General: Skin is warm and dry.  Neurological:     Mental Status: She is disoriented.     Comments: Not awake nor alert  Psychiatric:        Mood and Affect: Mood normal.        Judgment: Judgment normal.       Imaging: US RENAL  Result Date: 04/08/2019 CLINICAL DATA:  58 year old female with acute kidney injury. Hematuria. EXAM: RENAL / URINARY TRACT ULTRASOUND COMPLETE COMPARISON:  11/23/2018 ultrasound FINDINGS: Right Kidney: Renal measurements: 10.6 x 4.1 x 6 cm = volume: 136 mL . Echogenicity within normal limits. No mass or hydronephrosis visualized. Left Kidney: Renal measurements: 8.7 x 4.7 x 5.5 cm = volume: 116 mL. Echogenicity within normal limits. No mass or hydronephrosis visualized. Bladder: Complicated fluid within the bladder noted. No other significant abnormality. Other: Bilateral pleural effusions are present. IMPRESSION: 1. Unremarkable kidneys. 2. Complicated fluid within the bladder which may represent infection or blood given history of hematuria. 3. Bilateral pleural effusions. Electronically Signed   By: Margarette Canada M.D.   On: 04/08/2019 14:37   DG Chest Port 1 View  Result Date: 04/10/2019 CLINICAL DATA:  COVID-19 pneumonia, hypoxia EXAM: PORTABLE CHEST 1 VIEW COMPARISON:  04/08/2019 chest radiograph. FINDINGS: Stable cardiomediastinal silhouette with normal heart size. No pneumothorax. No pleural effusion. Severe patchy consolidation throughout both lungs, substantially worsened. IMPRESSION: Substantial worsening of severe patchy consolidation throughout both lungs compatible with COVID-19  pneumonia. Electronically Signed   By: Ilona Sorrel M.D.   On: 04/10/2019 14:32   DG Chest Portable 1 View  Result Date: 04/08/2019 CLINICAL DATA:  Edema. Patient reports fever and body aches. Bilateral leg swelling. EXAM: PORTABLE CHEST 1 VIEW COMPARISON:  Chest radiograph 07/12/2017 FINDINGS: Borderline cardiomegaly, increased heart size from prior exam. There are small bilateral pleural effusions. Patchy and interstitial opacities within both lungs. No pneumothorax. Bones are under mineralized. No acute osseous abnormalities are seen. IMPRESSION: 1. Borderline cardiomegaly with bilateral pleural effusions. 2. Patchy and interstitial opacities, suggesting combination of pulmonary edema and pneumonia, including atypical viral infection. Electronically Signed   By: Keith Rake M.D.   On: 04/08/2019 03:23    Labs:  CBC: Recent Labs    11/22/18 1509 11/23/18 0253 01/26/19 0842 02/09/19 0814 04/19/2019 2359 04/08/19 1254  WBC 10.0 11.3*  --   --  11.4* 10.3  HGB 9.2* 8.0* 12.3 11.6* 9.4* 7.9*  HCT 30.3* 25.6*  --   --  30.7* 25.3*  PLT 265 227  --   --  177 160    COAGS: No results for input(s): INR, APTT in the last 8760 hours.  BMP: Recent Labs    04/08/19 1254 04/09/19 0300 04/09/19 1558 04/10/19 0406  NA 142 139 139 140  K 5.4* 5.5* 5.3* 5.6*  CL 119* 115* 114* 113*  CO2 11* 11* 11* 10*  GLUCOSE 92 176* 129* 221*  BUN 68* 75* 80* 93*  CALCIUM 7.8* 7.8* 7.5* 7.4*  CREATININE 6.90* 7.26* 7.51* 8.33*  GFRNONAA 6* 6* 5* 5*  GFRAA 7* 6* 6* 6*    LIVER FUNCTION TESTS: Recent Labs    04/08/19 0422 04/09/19 0300 04/09/19 1558 04/10/19 0406  BILITOT 0.9 0.7 0.7 0.9  AST 121* 94* 85* 74*  ALT 23 23 21 19   ALKPHOS 61 46 41 42  PROT 6.2* 5.2* 5.2* 5.0*  ALBUMIN 1.8* 1.5* 1.4* 1.3*    TUMOR MARKERS: No results for input(s): AFPTM, CEA, CA199, CHROMGRNA in  the last 8760 hours.  Assessment and Plan: Renal failure in need of HD COVID+ For urgent tunneled HD  cath to start dialysis. Labs reviewed. Pt unable to consent herself at this time and no family available by phone. D/w Dr. Carolin Sicks and we have signed emergent consent so the procedure can be completed.   Thank you for this interesting consult.  I greatly enjoyed meeting CHERALYN FRAVEL and look forward to participating in their care.  A copy of this report was sent to the requesting provider on this date.  Electronically Signed: Ascencion Dike, PA-C 04/10/2019, 2:49 PM   I spent a total of 10 minutes in face to face in clinical consultation, greater than 50% of which was counseling/coordinating care for HD cath

## 2019-04-10 NOTE — Progress Notes (Signed)
IV lasix not available at administration time.  Texted pharmacy to deliver.  Dialysis nurse came to unit shortly after 1120 pm.  Holding IV lasix while patient gets dialysis d/t risk for hypotension.  Family med paged regarding holding lasix.

## 2019-04-10 NOTE — Procedures (Signed)
  Procedure: R internal jugular HD catheter placement     EBL:   minimal Complications:  none immediate  See full dictation in [facility].  Dillard Cannon MD Main # [phone] Pager  [phone]

## 2019-04-11 ENCOUNTER — Inpatient Hospital Stay (HOSPITAL_COMMUNITY): Payer: Medicaid Other

## 2019-04-11 DIAGNOSIS — E872 Acidosis, unspecified: Secondary | ICD-10-CM

## 2019-04-11 DIAGNOSIS — N032 Chronic nephritic syndrome with diffuse membranous glomerulonephritis: Secondary | ICD-10-CM

## 2019-04-11 DIAGNOSIS — J1282 Pneumonia due to coronavirus disease 2019: Secondary | ICD-10-CM

## 2019-04-11 DIAGNOSIS — J9601 Acute respiratory failure with hypoxia: Secondary | ICD-10-CM

## 2019-04-11 DIAGNOSIS — N179 Acute kidney failure, unspecified: Secondary | ICD-10-CM

## 2019-04-11 DIAGNOSIS — I4891 Unspecified atrial fibrillation: Secondary | ICD-10-CM | POA: Diagnosis not present

## 2019-04-11 DIAGNOSIS — J439 Emphysema, unspecified: Secondary | ICD-10-CM

## 2019-04-11 DIAGNOSIS — E875 Hyperkalemia: Secondary | ICD-10-CM | POA: Diagnosis not present

## 2019-04-11 DIAGNOSIS — Z992 Dependence on renal dialysis: Secondary | ICD-10-CM

## 2019-04-11 DIAGNOSIS — G9340 Encephalopathy, unspecified: Secondary | ICD-10-CM | POA: Diagnosis not present

## 2019-04-11 DIAGNOSIS — J432 Centrilobular emphysema: Secondary | ICD-10-CM

## 2019-04-11 DIAGNOSIS — E1121 Type 2 diabetes mellitus with diabetic nephropathy: Secondary | ICD-10-CM | POA: Diagnosis present

## 2019-04-11 DIAGNOSIS — U071 COVID-19: Secondary | ICD-10-CM

## 2019-04-11 HISTORY — DX: Type 2 diabetes mellitus with diabetic nephropathy: E11.21

## 2019-04-11 HISTORY — DX: Dependence on renal dialysis: Z99.2

## 2019-04-11 HISTORY — DX: Emphysema, unspecified: J43.9

## 2019-04-11 HISTORY — DX: Pneumonia due to coronavirus disease 2019: J12.82

## 2019-04-11 HISTORY — DX: Unspecified atrial fibrillation: I48.91

## 2019-04-11 HISTORY — DX: Chronic nephritic syndrome with diffuse membranous glomerulonephritis: N03.2

## 2019-04-11 HISTORY — DX: Acute respiratory failure with hypoxia: J96.01

## 2019-04-11 HISTORY — DX: COVID-19: U07.1

## 2019-04-11 LAB — CBC
HCT: 24.2 % — ABNORMAL LOW (ref 36.0–46.0)
HCT: 25.1 % — ABNORMAL LOW (ref 36.0–46.0)
Hemoglobin: 7.9 g/dL — ABNORMAL LOW (ref 12.0–15.0)
Hemoglobin: 8.5 g/dL — ABNORMAL LOW (ref 12.0–15.0)
MCH: 26.8 pg (ref 26.0–34.0)
MCH: 27.1 pg (ref 26.0–34.0)
MCHC: 32.6 g/dL (ref 30.0–36.0)
MCHC: 33.9 g/dL (ref 30.0–36.0)
MCV: 79.2 fL — ABNORMAL LOW (ref 80.0–100.0)
MCV: 82.9 fL (ref 80.0–100.0)
Platelets: 162 10*3/uL (ref 150–400)
Platelets: 182 10*3/uL (ref 150–400)
RBC: 2.92 MIL/uL — ABNORMAL LOW (ref 3.87–5.11)
RBC: 3.17 MIL/uL — ABNORMAL LOW (ref 3.87–5.11)
RDW: 17.9 % — ABNORMAL HIGH (ref 11.5–15.5)
RDW: 18.5 % — ABNORMAL HIGH (ref 11.5–15.5)
WBC: 16.8 10*3/uL — ABNORMAL HIGH (ref 4.0–10.5)
WBC: 18.1 10*3/uL — ABNORMAL HIGH (ref 4.0–10.5)
nRBC: 1.2 % — ABNORMAL HIGH (ref 0.0–0.2)
nRBC: 1.4 % — ABNORMAL HIGH (ref 0.0–0.2)

## 2019-04-11 LAB — ECHOCARDIOGRAM COMPLETE
Height: 64 in
Weight: 2204.6 oz

## 2019-04-11 LAB — COMPREHENSIVE METABOLIC PANEL
ALT: 20 U/L (ref 0–44)
AST: 93 U/L — ABNORMAL HIGH (ref 15–41)
Albumin: 1.5 g/dL — ABNORMAL LOW (ref 3.5–5.0)
Alkaline Phosphatase: 59 U/L (ref 38–126)
Anion gap: 20 — ABNORMAL HIGH (ref 5–15)
BUN: 70 mg/dL — ABNORMAL HIGH (ref 6–20)
CO2: 15 mmol/L — ABNORMAL LOW (ref 22–32)
Calcium: 7.4 mg/dL — ABNORMAL LOW (ref 8.9–10.3)
Chloride: 107 mmol/L (ref 98–111)
Creatinine, Ser: 5.74 mg/dL — ABNORMAL HIGH (ref 0.44–1.00)
GFR calc Af Amer: 9 mL/min — ABNORMAL LOW (ref >60)
GFR calc non Af Amer: 7 mL/min — ABNORMAL LOW (ref >60)
Glucose, Bld: 172 mg/dL — ABNORMAL HIGH (ref 70–99)
Potassium: 4.5 mmol/L (ref 3.5–5.1)
Sodium: 142 mmol/L (ref 135–145)
Total Bilirubin: 0.7 mg/dL (ref 0.3–1.2)
Total Protein: 5.2 g/dL — ABNORMAL LOW (ref 6.5–8.1)

## 2019-04-11 LAB — C-REACTIVE PROTEIN: CRP: 24 mg/dL — ABNORMAL HIGH (ref <1.0)

## 2019-04-11 LAB — GLUCOSE, CAPILLARY
Glucose-Capillary: 166 mg/dL — ABNORMAL HIGH (ref 70–99)
Glucose-Capillary: 158 mg/dL — ABNORMAL HIGH (ref 70–99)
Glucose-Capillary: 207 mg/dL — ABNORMAL HIGH (ref 70–99)
Glucose-Capillary: 202 mg/dL — ABNORMAL HIGH (ref 70–99)

## 2019-04-11 LAB — D-DIMER, QUANTITATIVE: D-Dimer, Quant: 7.83 ug/mL-FEU — ABNORMAL HIGH (ref 0.00–0.50)

## 2019-04-11 LAB — HEMOGLOBIN AND HEMATOCRIT, BLOOD
HCT: 25.9 % — ABNORMAL LOW (ref 36.0–46.0)
Hemoglobin: 8.6 g/dL — ABNORMAL LOW (ref 12.0–15.0)

## 2019-04-11 LAB — PHOSPHORUS: Phosphorus: 8.1 mg/dL — ABNORMAL HIGH (ref 2.5–4.6)

## 2019-04-11 LAB — HEPATITIS B CORE ANTIBODY, IGM: Hep B C IgM: NONREACTIVE

## 2019-04-11 LAB — TROPONIN I (HIGH SENSITIVITY): Troponin I (High Sensitivity): 366 ng/L (ref <18)

## 2019-04-11 LAB — HEPATITIS B SURFACE ANTIBODY,QUALITATIVE: Hep B S Ab: NONREACTIVE

## 2019-04-11 LAB — HEPARIN LEVEL (UNFRACTIONATED): Heparin Unfractionated: 1.26 IU/mL — ABNORMAL HIGH (ref 0.30–0.70)

## 2019-04-11 LAB — TSH: TSH: 0.689 u[IU]/mL (ref 0.350–4.500)

## 2019-04-11 MED ORDER — LIDOCAINE-PRILOCAINE 2.5-2.5 % EX CREA
1.0000 "application " | TOPICAL_CREAM | CUTANEOUS | Status: DC | PRN
  Filled 2019-04-11: qty 5

## 2019-04-11 MED ORDER — DARBEPOETIN ALFA 100 MCG/0.5ML IJ SOSY
100.0000 ug | PREFILLED_SYRINGE | INTRAMUSCULAR | Status: DC
  Filled 2019-04-11: qty 0.5

## 2019-04-11 MED ORDER — IPRATROPIUM-ALBUTEROL 20-100 MCG/ACT IN AERS
1.0000 | INHALATION_SPRAY | Freq: Four times a day (QID) | RESPIRATORY_TRACT | Status: DC
  Administered 2019-04-11 (×2): 1 via RESPIRATORY_TRACT
  Filled 2019-04-11: qty 4

## 2019-04-11 MED ORDER — SODIUM CHLORIDE 0.9 % IV SOLN
100.0000 mL | INTRAVENOUS | Status: DC | PRN
  Administered 2019-04-15: 100 mL via INTRAVENOUS

## 2019-04-11 MED ORDER — LIDOCAINE HCL (PF) 1 % IJ SOLN: 5.0000 mL | INTRAMUSCULAR | Status: DC | PRN

## 2019-04-11 MED ORDER — METOPROLOL TARTRATE 5 MG/5ML IV SOLN
5.0000 mg | Freq: Four times a day (QID) | INTRAVENOUS | Status: DC
  Administered 2019-04-11 – 2019-04-12 (×3): 5 mg via INTRAVENOUS
  Filled 2019-04-11 (×3): qty 5

## 2019-04-11 MED ORDER — HEPARIN (PORCINE) 25000 UT/250ML-% IV SOLN: 700.0000 [IU]/h | INTRAVENOUS | Status: DC

## 2019-04-11 MED ORDER — PENTAFLUOROPROP-TETRAFLUOROETH EX AERO: 1.0000 "application " | INHALATION_SPRAY | CUTANEOUS | Status: DC | PRN

## 2019-04-11 MED ORDER — CEPHALEXIN 500 MG PO CAPS: 500.0000 mg | ORAL_CAPSULE | ORAL | Status: DC

## 2019-04-11 MED ORDER — METOPROLOL TARTRATE 12.5 MG HALF TABLET: 12.5000 mg | ORAL_TABLET | Freq: Four times a day (QID) | ORAL | Status: DC

## 2019-04-11 MED ORDER — CHLORHEXIDINE GLUCONATE CLOTH 2 % EX PADS
6.0000 | MEDICATED_PAD | Freq: Every day | CUTANEOUS | Status: DC
  Administered 2019-04-12 – 2019-04-14 (×3): 6 via TOPICAL

## 2019-04-11 MED ORDER — ONDANSETRON HCL 4 MG/2ML IJ SOLN: 4.0000 mg | Freq: Three times a day (TID) | INTRAMUSCULAR | Status: DC | PRN

## 2019-04-11 MED ORDER — ALTEPLASE 2 MG IJ SOLR: 2.0000 mg | Freq: Once | INTRAMUSCULAR | Status: DC | PRN

## 2019-04-11 MED ORDER — HEPARIN (PORCINE) 25000 UT/250ML-% IV SOLN
900.0000 [IU]/h | INTRAVENOUS | Status: DC
  Administered 2019-04-11: 900 [IU]/h via INTRAVENOUS
  Filled 2019-04-11: qty 250

## 2019-04-11 MED ORDER — HEPARIN SODIUM (PORCINE) 1000 UNIT/ML DIALYSIS
1000.0000 [IU] | INTRAMUSCULAR | Status: DC | PRN
  Filled 2019-04-11: qty 1

## 2019-04-11 MED ORDER — CHLORHEXIDINE GLUCONATE CLOTH 2 % EX PADS: 6.0000 | MEDICATED_PAD | Freq: Every day | CUTANEOUS | Status: DC

## 2019-04-11 NOTE — Progress Notes (Signed)
ANTICOAGULATION CONSULT NOTE - Initial Consult  Pharmacy Consult for heparin infusion Indication: atrial fibrillation  Patient Measurements: Height: 5\' 4"  (162.6 cm) Weight: 137 lb 12.6 oz (62.5 kg) IBW/kg (Calculated) : 54.7 Heparin Dosing Weight: 62.5 kg  Vital Signs: Temp: 97.5 F (36.4 C) (12/23 0800) Temp Source: Oral (12/23 0800) BP: 146/74 (12/23 0800) Pulse Rate: 62 (12/23 0228)  Labs: Recent Labs    04/08/19 1254 04/08/19 2031 04/08/19 2310 04/09/19 0105 04/09/19 0820 04/09/19 1558 04/10/19 0406 04/10/19 1645 04/10/19 2359 04/11/19 0451  HGB 7.9*  --   --   --   --   --   --   --  7.9* 8.5*  HCT 25.3*  --   --   --   --   --   --   --  24.2* 25.1*  PLT 160  --   --   --   --   --   --   --  182 162  LABPROT  --   --   --   --   --   --   --  15.0  --   --   INR  --   --   --   --   --   --   --  1.2  --   --   CREATININE 6.90*   < >  --   --   --  7.51* 8.33*  --   --  5.74*  TROPONINIHS  --   --  1,879* 2,156* 1,139*  --   --   --   --   --    < > = values in this interval not displayed.    Estimated Creatinine Clearance: 9.1 mL/min (A) (by C-G formula based on SCr of 5.74 mg/dL (H)).   Medical History: Past Medical History:  Diagnosis Date  . Chronic kidney disease (CKD), stage IV (severe) ([place]) 07/08/2017  . CKD (chronic kidney disease)   . Diabetes mellitus without complication ([place])   . Diabetic nephropathy ([place]) 04/11/2019  . Fall 11/22/2018  . Frequent falls 07/22/2017  . Grief 09/08/2017  . Hematuria 01/20/2017   Noted on UA's in June & Oct 2018, possibly related to UTI [ ]  recheck UA at future visit  . Hypertension   . Neuropathy   . Pancreatitis 10/08/2016  . Postherpetic neuralgia 09/08/2017  . Symptomatic anemia 04/05/2018  . Tremor     Assessment: 58 yo W starting on heparin for new Afib (CHADS2VASc = 3) in the setting of COVID PNA and elevated D-dimer (peak 8.75).   Patient was on heparin 7500 subcutaneous Q8 hr for COVID and high  D-dimer, last dose at 6am today. Anemic, H/H & plt stable.  Goal of Therapy:  Heparin level 0.3-0.7 units/ml Monitor platelets by anticoagulation protocol: Yes   Plan:  Start heparin 900 units/hr DC subcutaneous heparin  F/U 6hr HL Monitor daily HL, CBC, plt Monitor for signs/symptoms of bleeding   Benetta Spar, PharmD, BCPS, BCCP Clinical Pharmacist  Please check AMION for all [place] phone numbers After 10:00 PM, call [place] [phone]

## 2019-04-11 NOTE — Progress Notes (Addendum)
Family Medicine Teaching Service Daily Progress Note Intern Pager: [phone]  Patient name: Andrea Glenn Medical record number: [id number] Date of birth: [birth year] Age: 58 y.o. Gender: female  Primary Care Provider: Guadalupe Dawn, MD Consultants: Nephrololgy, wound care Code Status: FULL  Pt Overview and Major Events to Date:  12/20 Admitted to [facility] 12/22- on HFNC   Assessment and Plan:  Andrea Glenn is a 58 y.o. female presenting with weakness now with sepsis due to [place]. PMH is significant for focal segmental glomerulonephritis, CKD, DM, HTN, venous stasis, restless leg syndrome.  Sepsis 2/2 COVID Pneumonia Patient has been stable on 15 L HFNC overnight.  No desaturations on this. 1/2 blood cultures positive for bacillus species.  Urine culture positive for Klebsiella, resistant to ampicillin.  CRP has been uptrending since admission, 12.3>17>20.3>24.  D-dimer downtrending 8.75>>7.83.  12/22 had chest x-ray with worsening of patchy consolidation in lungs bilaterally. -Continue dexamethasone day 4 of 10 and remdesivir day 4 of 5 -Assess for need to escalate abx  New ESRD on HD, Hx of FSGN  UTI Urine culture with greater than 100,000 colonies of Klebsiella pneumonia, resistant to ampicillin.  Blood culture with 1/2+ for bacillus species.  AST/ALT gradually uptrending, is 74-93/19-20.  Her medical history is significant for FSGN, hypertension, diabetes.  She follows outpatient with nephrology and has previously been treated with tacrolimus which has been discontinued and replaced more recently with rituximab infusions.    Patient had tunnel catheter placed on 12/22 for HD.  Received HD overnight per chart review, -1L, but patient states that she did not have HD last night.  Tunnel catheter site with bandage over top. -Nephrology consulted, now on HD -Strict I's and O's -CTX to Keflex for UTI for 5 days total  New Onset A Fib Seen on telemetry this AM around 10AM.  HR on  charting 104, no RVR.  Cardiology is seeing patient already.   - f/u cards recs - echo order - stat EKG - heparin per pharmacy for A fib dosing - add-on TSH  Elevated troponin At bedtime troponin 1190 on admission, up trended to 2156, then down trended to 1139.  Patient with significantly elevated troponins on admission, thought to be secondary to demand ischemia.  Cardiology was consulted, recommended starting aspirin, discontinue Norvasc and start low-dose beta-blocker.  Also recommended checking echocardiogram. -Hold home Norvasc -Continue metoprolol 12.5 mg twice daily -Cardiology following appreciate recommendations -f/u Echo  Hyperkalemia: Resolved Hyperkalemia resolved with HD overnight.  K4.5 this AM. -Follow-up nephrology recommendations -Continue to monitor BMP  Volume overload, Now on HD Most likely secondary to kidney failure, patient now on HD as of 12/22.  Tunnel cath in place.  Volume status this a.m. appears improved, patient does endorse some abdominal pain this morning. -Cardiology consulted, recommending echo  -cont Metoprolol 12.5mg  BID  -Lasix 120mg  q8h, per nephro -Appreciate nephro recs appreciated  Lower extremity swelling with pain History and physical consistent with venous stasis.    Bilateral lower extremities with bandage wrapped. -Consider gabapentin for worsening pain control -Monitor lower extremity swelling, lasix as above - wound care consulted  AST elevation, downtrending ST elevation on admission likely secondary to sepsis, AST was 121 at that time, his nontender to 93.  Patient on remdesivir which can also cause elevation in LFTs, will monitor closely.  ALT within normal limits at 20.  Patient complaining of some abdominal pain this morning and guarding her right upper quadrant, very difficult for her to provide a history  however on whether or not this is new. -Trending LFTs with daily CMP -Consider right upper quadrant ultrasound if  abdominal pain continues  Diabetes, type II with LE neuropathy Last A1c 5.9 (11/22/18), home medication includes dulaglutide once weekly.  CBG is within normal limits overnight, only receiving sliding scale insulin. -SSI sensitive -Monitor CBGs  Anemia, normocytic  Hemoglobin 9.4 on admission decreased from 11.62 months ago.  Hemoglobin today stable at 8.5.  Ferritin on admission elevated to 2725, likely secondary to chronic kidney disease. -Continue to monitor on daily CBC -Transfuse when necessary  HTN Remains intermittently mildly hypertensive, 162/83 this a.m., overnight was 127/74, with lowest BP overnight 107/77.Marland Kitchen  Home medication included amlodipine 10 mg daily prior to this admission, holding now per cards recs and change to metop 12.5mg  BID.  No concern for hypotension at this time. -cont Metoprolol 12.5mg  twice daily  MDD Currently taking fluoxetine. -Continue fluoxetine  Nicotine use disorder Current smoker.  No desire for nicotine patch at this time. -Order nicotine patch if desired  FEN/GI: renal diet Prophylaxis: heparin per pharm for COVID-19 infection and new-onset A fib  Disposition: Pending PT OT eval, patient lives home alone  Subjective:  Patient lying in bed, wincing in pain, answering questions minimally.  Does note some abdominal pain, but does not elaborate as to whether this is acute or chronic.  States that she did not receive HD last night, despite the chart showing that she had 1 L removed.  Objective: Temp:  [97.5 F (36.4 C)-98.4 F (36.9 C)] 97.7 F (36.5 C) (12/23 0337) Pulse Rate:  [62-70] 62 (12/23 0228) Resp:  [12-19] 12 (12/23 0228) BP: (99-144)/(65-97) 127/74 (12/23 0228) SpO2:  [90 %-100 %] 99 % (12/23 0228) Weight:  [62.5 kg-64.2 kg] 62.5 kg (12/23 0228)   Physical Exam:  General: 58 y.o. female in NAD, however appears uncomfortable, answers some questions but only minimally despite multiple attempts Cardio: RRR no m/r/g Lungs:  Diffuse crackles, no increased work of breathing, HFNC 15 L Abdomen: Clutching her abdomen, voluntary guarding with right upper quadrant palpation Skin: warm and dry Extremities: Trace bilateral lower extremity edema, bilateral lower extremities with Ace wrap bandages   Laboratory: Recent Labs  Lab 04/08/19 1254 04/10/19 2359 04/11/19 0451  WBC 10.3 16.8* 18.1*  HGB 7.9* 7.9* 8.5*  HCT 25.3* 24.2* 25.1*  PLT 160 182 162   Recent Labs  Lab 04/09/19 1558 04/10/19 0406 04/11/19 0451  NA 139 140 142  K 5.3* 5.6* 4.5  CL 114* 113* 107  CO2 11* 10* 15*  BUN 80* 93* 70*  CREATININE 7.51* 8.33* 5.74*  CALCIUM 7.5* 7.4* 7.4*  PROT 5.2* 5.0* 5.2*  BILITOT 0.7 0.9 0.7  ALKPHOS 41 42 59  ALT 21 19 20   AST 85* 74* 93*  GLUCOSE 129* 221* 172*      Imaging/Diagnostic Tests: IR Fluoro Guide CV Line Right  Result Date: 04/11/2019 CLINICAL DATA:  Renal failure, needs access for hemodialysis. COVID positive EXAM: TUNNELED HEMODIALYSIS CATHETER PLACEMENT WITH ULTRASOUND AND FLUOROSCOPIC GUIDANCE TECHNIQUE: The procedure, risks, benefits, and alternatives were explained to the patient and family. Questions regarding the procedure were encouraged and answered. The family understands and consents to the procedure. As antibiotic prophylaxis, cefazolin 1 g was ordered pre-procedure and administered intravenously within one hour of incision.Patency of the right IJ vein was confirmed with ultrasound with image documentation. An appropriate skin site was determined. Region was prepped using maximum barrier technique including cap and mask, sterile gown, sterile  gloves, large sterile sheet, and Chlorhexidine as cutaneous antisepsis. The region was infiltrated locally with 1% lidocaine. Intravenous Fentanyl 45mcg and Versed 1mg  were administered as conscious sedation during continuous monitoring of the patient's level of consciousness and physiological / cardiorespiratory status by the radiology RN,  with a total moderate sedation time of 10 minutes. Under real-time ultrasound guidance, the right IJ vein was accessed with a 21 gauge micropuncture needle; the needle tip within the vein was confirmed with ultrasound image documentation. Needle exchanged over the 018 guidewire for transitional dilator, which allowed advancement of a Benson wire into the IVC. Over this, an MPA catheter was advanced. A Palindrome 23 hemodialysis catheter was tunneled from the right anterior chest wall approach to the right IJ dermatotomy site. The MPA catheter was exchanged over an Amplatz wire for serial vascular dilators which allow placement of a peel-away sheath, through which the catheter was advanced under intermittent fluoroscopy, positioned with its tips in the proximal and midright atrium. Spot chest radiograph confirms good catheter position. No pneumothorax. Catheter was flushed and primed per protocol. Catheter secured externally with O Prolene sutures. The right IJ dermatotomy site was closed with Dermabond. COMPLICATIONS: COMPLICATIONS None immediate FLUOROSCOPY TIME:  0.9 minutes; 25  uGym2 DAP COMPARISON:  None IMPRESSION: 1. Technically successful placement of tunneled right IJ hemodialysis catheter with ultrasound and fluoroscopic guidance. Ready for routine use. ACCESS: Remains approachable for percutaneous intervention as needed. Electronically Signed   By: Lucrezia Europe M.D.   On: 04/11/2019 07:44   IR US Guide Vasc Access Right  Result Date: 04/11/2019 CLINICAL DATA:  Renal failure, needs access for hemodialysis. COVID positive EXAM: TUNNELED HEMODIALYSIS CATHETER PLACEMENT WITH ULTRASOUND AND FLUOROSCOPIC GUIDANCE TECHNIQUE: The procedure, risks, benefits, and alternatives were explained to the patient and family. Questions regarding the procedure were encouraged and answered. The family understands and consents to the procedure. As antibiotic prophylaxis, cefazolin 1 g was ordered pre-procedure and  administered intravenously within one hour of incision.Patency of the right IJ vein was confirmed with ultrasound with image documentation. An appropriate skin site was determined. Region was prepped using maximum barrier technique including cap and mask, sterile gown, sterile gloves, large sterile sheet, and Chlorhexidine as cutaneous antisepsis. The region was infiltrated locally with 1% lidocaine. Intravenous Fentanyl 39mcg and Versed 1mg  were administered as conscious sedation during continuous monitoring of the patient's level of consciousness and physiological / cardiorespiratory status by the radiology RN, with a total moderate sedation time of 10 minutes. Under real-time ultrasound guidance, the right IJ vein was accessed with a 21 gauge micropuncture needle; the needle tip within the vein was confirmed with ultrasound image documentation. Needle exchanged over the 018 guidewire for transitional dilator, which allowed advancement of a Benson wire into the IVC. Over this, an MPA catheter was advanced. A Palindrome 23 hemodialysis catheter was tunneled from the right anterior chest wall approach to the right IJ dermatotomy site. The MPA catheter was exchanged over an Amplatz wire for serial vascular dilators which allow placement of a peel-away sheath, through which the catheter was advanced under intermittent fluoroscopy, positioned with its tips in the proximal and midright atrium. Spot chest radiograph confirms good catheter position. No pneumothorax. Catheter was flushed and primed per protocol. Catheter secured externally with O Prolene sutures. The right IJ dermatotomy site was closed with Dermabond. COMPLICATIONS: COMPLICATIONS None immediate FLUOROSCOPY TIME:  0.9 minutes; 25  uGym2 DAP COMPARISON:  None IMPRESSION: 1. Technically successful placement of tunneled right IJ hemodialysis catheter with  ultrasound and fluoroscopic guidance. Ready for routine use. ACCESS: Remains approachable for  percutaneous intervention as needed. Electronically Signed   By: Lucrezia Europe M.D.   On: 04/11/2019 07:44   DG Chest Port 1 View  Result Date: 04/10/2019 CLINICAL DATA:  COVID-19 pneumonia, hypoxia EXAM: PORTABLE CHEST 1 VIEW COMPARISON:  04/08/2019 chest radiograph. FINDINGS: Stable cardiomediastinal silhouette with normal heart size. No pneumothorax. No pleural effusion. Severe patchy consolidation throughout both lungs, substantially worsened. IMPRESSION: Substantial worsening of severe patchy consolidation throughout both lungs compatible with COVID-19 pneumonia. Electronically Signed   By: Ilona Sorrel M.D.   On: 04/10/2019 14:32     Courtland Reas, Bernita Raisin, DO 04/11/2019, 7:57 AM PGY-1, [facility] Intern pager: [phone], text pages welcome

## 2019-04-11 NOTE — Progress Notes (Signed)
  Echocardiogram 2D Echocardiogram has been performed.  Andrea Glenn 04/11/2019, 2:22 PM

## 2019-04-11 NOTE — Progress Notes (Signed)
[name] NEPHROLOGY PROGRESS NOTE  Assessment/ Plan: Pt is a 58 y.o. yo female  with history of poorly controlled DM, HTN, HLD, nephrotic syndrome with biopsy-proven FSGS and diabetic nephropathy s/p prograf and rituximab, CKD stage IV with creatinine level somewhere between 3-4 at baseline, presented with worsening leg edema, Covid 19+, seen as a consultation for AKI on CKD.  #AKI on CKD stage IV with fluid overload: She has biopsy-proven FSGS and diabetic changes and follows at [facility]-  Has not responded to anything.  She had received Prograf and subsequently 2 doses of rituximab.  She now has UTI and COVID-19 positive. Kidney US: No hydronephrosis.   Renal parameters  worsening including hyperkalemia, acidosis and uremia. S/p  placement of tunneled catheter and first HD early this AM. Will plan for second treatment at some point on Thursday.  I am certain that she is going to be long term HD requiring- starting CLIP   #Fluid overload/acute pulmonary edema: Refractory to diuretics therefore restarting dialysis.  Did make urine-  Will continue lasix for now   #Metabolic acidosis:  Starting renal replacement therapy as discussed above- slightly better. Cont bicarb for now until get another treatment under her belt   #Hyperkalemia: better  #COVID-19 positive: Receiving remdesivir and Decadron by primary team.  #UTI: On ceftriaxone.  Follow-up culture result.  Anemia-  chec iron stores and give ESA   Subjective: Got HD early this AM- seemed to tolerate well   Objective Vital signs in last 24 hours: Vitals:   04/11/19 0228 04/11/19 0337 04/11/19 0800 04/11/19 1200  BP: 127/74  (!) 146/74 115/71  Pulse: 62   (!) 109  Resp: 12   12  Temp: (!) 97.5 F (36.4 C) 97.7 F (36.5 C) (!) 97.5 F (36.4 C) 97.9 F (36.6 C)  TempSrc: Oral  Oral Oral  SpO2: 99%   92%  Weight: 62.5 kg     Height:       Weight change:   Intake/Output Summary (Last 24 hours) at 04/11/2019  1338 Last data filed at 04/11/2019 0800 Gross per 24 hour  Intake 462.63 ml  Output 2050 ml  Net -1587.37 ml       Labs: Basic Metabolic Panel: Recent Labs  Lab 04/09/19 1558 04/10/19 0406 04/11/19 0451  NA 139 140 142  K 5.3* 5.6* 4.5  CL 114* 113* 107  CO2 11* 10* 15*  GLUCOSE 129* 221* 172*  BUN 80* 93* 70*  CREATININE 7.51* 8.33* 5.74*  CALCIUM 7.5* 7.4* 7.4*  PHOS  --   --  8.1*   Liver Function Tests: Recent Labs  Lab 04/09/19 1558 04/10/19 0406 04/11/19 0451  AST 85* 74* 93*  ALT 21 19 20   ALKPHOS 41 42 59  BILITOT 0.7 0.9 0.7  PROT 5.2* 5.0* 5.2*  ALBUMIN 1.4* 1.3* 1.5*   No results for input(s): LIPASE, AMYLASE in the last 168 hours. No results for input(s): AMMONIA in the last 168 hours. CBC: Recent Labs  Lab 04/08/2019 2359 04/08/19 1254 04/10/19 2359 04/11/19 0451  WBC 11.4* 10.3 16.8* 18.1*  NEUTROABS 9.8*  --   --   --   HGB 9.4* 7.9* 7.9* 8.5*  HCT 30.7* 25.3* 24.2* 25.1*  MCV 87.2 86.9 82.9 79.2*  PLT 177 160 182 162   Cardiac Enzymes: No results for input(s): CKTOTAL, CKMB, CKMBINDEX, TROPONINI in the last 168 hours. CBG: Recent Labs  Lab 04/10/19 1142 04/10/19 1703 04/10/19 2008 04/11/19 0744 04/11/19 1157  GLUCAP 160*  148* 151* 166* 158*    Iron Studies:  No results for input(s): IRON, TIBC, TRANSFERRIN, FERRITIN in the last 72 hours. Studies/Results: IR Fluoro Guide CV Line Right  Result Date: 04/11/2019 CLINICAL DATA:  Renal failure, needs access for hemodialysis. COVID positive EXAM: TUNNELED HEMODIALYSIS CATHETER PLACEMENT WITH ULTRASOUND AND FLUOROSCOPIC GUIDANCE TECHNIQUE: The procedure, risks, benefits, and alternatives were explained to the patient and family. Questions regarding the procedure were encouraged and answered. The family understands and consents to the procedure. As antibiotic prophylaxis, cefazolin 1 g was ordered pre-procedure and administered intravenously within one hour of incision.Patency of the  right IJ vein was confirmed with ultrasound with image documentation. An appropriate skin site was determined. Region was prepped using maximum barrier technique including cap and mask, sterile gown, sterile gloves, large sterile sheet, and Chlorhexidine as cutaneous antisepsis. The region was infiltrated locally with 1% lidocaine. Intravenous Fentanyl 38mcg and Versed 1mg  were administered as conscious sedation during continuous monitoring of the patient's level of consciousness and physiological / cardiorespiratory status by the radiology RN, with a total moderate sedation time of 10 minutes. Under real-time ultrasound guidance, the right IJ vein was accessed with a 21 gauge micropuncture needle; the needle tip within the vein was confirmed with ultrasound image documentation. Needle exchanged over the 018 guidewire for transitional dilator, which allowed advancement of a Benson wire into the IVC. Over this, an MPA catheter was advanced. A Palindrome 23 hemodialysis catheter was tunneled from the right anterior chest wall approach to the right IJ dermatotomy site. The MPA catheter was exchanged over an Amplatz wire for serial vascular dilators which allow placement of a peel-away sheath, through which the catheter was advanced under intermittent fluoroscopy, positioned with its tips in the proximal and midright atrium. Spot chest radiograph confirms good catheter position. No pneumothorax. Catheter was flushed and primed per protocol. Catheter secured externally with O Prolene sutures. The right IJ dermatotomy site was closed with Dermabond. COMPLICATIONS: COMPLICATIONS None immediate FLUOROSCOPY TIME:  0.9 minutes; 25  uGym2 DAP COMPARISON:  None IMPRESSION: 1. Technically successful placement of tunneled right IJ hemodialysis catheter with ultrasound and fluoroscopic guidance. Ready for routine use. ACCESS: Remains approachable for percutaneous intervention as needed. Electronically Signed   By: Lucrezia Europe M.D.    On: 04/11/2019 07:44   IR US Guide Vasc Access Right  Result Date: 04/11/2019 CLINICAL DATA:  Renal failure, needs access for hemodialysis. COVID positive EXAM: TUNNELED HEMODIALYSIS CATHETER PLACEMENT WITH ULTRASOUND AND FLUOROSCOPIC GUIDANCE TECHNIQUE: The procedure, risks, benefits, and alternatives were explained to the patient and family. Questions regarding the procedure were encouraged and answered. The family understands and consents to the procedure. As antibiotic prophylaxis, cefazolin 1 g was ordered pre-procedure and administered intravenously within one hour of incision.Patency of the right IJ vein was confirmed with ultrasound with image documentation. An appropriate skin site was determined. Region was prepped using maximum barrier technique including cap and mask, sterile gown, sterile gloves, large sterile sheet, and Chlorhexidine as cutaneous antisepsis. The region was infiltrated locally with 1% lidocaine. Intravenous Fentanyl 58mcg and Versed 1mg  were administered as conscious sedation during continuous monitoring of the patient's level of consciousness and physiological / cardiorespiratory status by the radiology RN, with a total moderate sedation time of 10 minutes. Under real-time ultrasound guidance, the right IJ vein was accessed with a 21 gauge micropuncture needle; the needle tip within the vein was confirmed with ultrasound image documentation. Needle exchanged over the 018 guidewire for transitional dilator, which allowed  advancement of a Benson wire into the IVC. Over this, an MPA catheter was advanced. A Palindrome 23 hemodialysis catheter was tunneled from the right anterior chest wall approach to the right IJ dermatotomy site. The MPA catheter was exchanged over an Amplatz wire for serial vascular dilators which allow placement of a peel-away sheath, through which the catheter was advanced under intermittent fluoroscopy, positioned with its tips in the proximal and midright  atrium. Spot chest radiograph confirms good catheter position. No pneumothorax. Catheter was flushed and primed per protocol. Catheter secured externally with O Prolene sutures. The right IJ dermatotomy site was closed with Dermabond. COMPLICATIONS: COMPLICATIONS None immediate FLUOROSCOPY TIME:  0.9 minutes; 25  uGym2 DAP COMPARISON:  None IMPRESSION: 1. Technically successful placement of tunneled right IJ hemodialysis catheter with ultrasound and fluoroscopic guidance. Ready for routine use. ACCESS: Remains approachable for percutaneous intervention as needed. Electronically Signed   By: Lucrezia Europe M.D.   On: 04/11/2019 07:44   DG Chest Port 1 View  Result Date: 04/10/2019 CLINICAL DATA:  COVID-19 pneumonia, hypoxia EXAM: PORTABLE CHEST 1 VIEW COMPARISON:  04/08/2019 chest radiograph. FINDINGS: Stable cardiomediastinal silhouette with normal heart size. No pneumothorax. No pleural effusion. Severe patchy consolidation throughout both lungs, substantially worsened. IMPRESSION: Substantial worsening of severe patchy consolidation throughout both lungs compatible with COVID-19 pneumonia. Electronically Signed   By: Ilona Sorrel M.D.   On: 04/10/2019 14:32    Medications: Infusions: . sodium chloride    . sodium chloride    . furosemide 120 mg (04/11/19 [id number])  . heparin    . remdesivir 100 mg in NS 100 mL 100 mg (04/11/19 0910)    Scheduled Medications: . aspirin  81 mg Oral Daily  . [START ON 04/12/2019] cephALEXin  500 mg Oral Q24H  . Chlorhexidine Gluconate Cloth  6 each Topical Q0600  . dexamethasone (DECADRON) injection  6 mg Intravenous Q24H  . FLUoxetine  20 mg Oral Daily  . insulin aspart  0-9 Units Subcutaneous TID WC  . metoprolol tartrate  12.5 mg Oral Q6H  . sodium bicarbonate  1,300 mg Oral TID  . sodium chloride flush  3 mL Intravenous Q12H    have reviewed scheduled and prn medications.  Physical Exam: Patient was not examined directly because of COVID-19 pneumonia in  order to lower exposure and to preserve PPE.  I have discussed with the patient, her nurse and primary team and reviewed the pertinent physical examination.    Andrea Glenn A Mehgan Santmyer 04/11/2019,1:38 PM  LOS: 3 days

## 2019-04-11 NOTE — Progress Notes (Addendum)
FPTS Interim Progress Note  Advised by nurse that patient had episode of dark brown emesis at 1030 and stated "I feel better" afterwards.  Vomit was disposed of prior to discussing with nurse, therefore cannot perform FOBT to determine if was true blood.  Patient's pulse at present is in 90s at present per chart review.  Will obtain stat H&H and continue to monitor.  If occurs again, will order FOBT to determine if emesis is bloody.  Update: Paged with patient having nausea this afternoon, requesting IV zofran, order placed.  Patient with down-trending trop to 366, which is very reassuring.  Will continue to monitor and have night team see patient.   [place], DO 04/11/2019, 2:27 PM PGY-2, [place] Medicine Service pager [phone]

## 2019-04-11 NOTE — Progress Notes (Signed)
FPTS Interim Progress Note  Update:  EKG does show A fib with RVR.  Per note, cardiology is aware.  Changing metoprolol QID for rate control.  Will continue with hep gtt.Sandi Carne, Bernita Raisin, DO 04/11/2019, 12:52 PM PGY-2, [place] Service pager [phone]

## 2019-04-11 NOTE — Progress Notes (Signed)
Progress Note  Patient Name: Andrea Glenn Date of Encounter: 04/11/2019  Primary Cardiologist:   No primary care provider on file.   Subjective   The patient was lying in bed without acute tachypnea.  She has pain to touch diffusely.  Somnolent but communicates, answers questions  Inpatient Medications    Scheduled Meds: . aspirin  81 mg Oral Daily  . Chlorhexidine Gluconate Cloth  6 each Topical Q0600  . dexamethasone (DECADRON) injection  6 mg Intravenous Q24H  . FLUoxetine  20 mg Oral Daily  . heparin  7,500 Units Subcutaneous Q8H  . insulin aspart  0-9 Units Subcutaneous TID WC  . metoprolol tartrate  12.5 mg Oral BID  . sodium bicarbonate  1,300 mg Oral TID  . sodium chloride flush  3 mL Intravenous Q12H   Continuous Infusions: . sodium chloride    . sodium chloride    .  ceFAZolin (ANCEF) IV    . cefTRIAXone (ROCEPHIN)  IV 1 g (04/11/19 0706)  . furosemide 120 mg (04/11/19 [id number])  . remdesivir 100 mg in NS 100 mL 100 mg (04/10/19 0836)   PRN Meds: sodium chloride, sodium chloride, acetaminophen, alteplase, heparin, heparin, lidocaine (PF), lidocaine-prilocaine, ondansetron, pentafluoroprop-tetrafluoroeth, polyethylene glycol, traMADol   Vital Signs    Vitals:   04/11/19 0200 04/11/19 0215 04/11/19 0228 04/11/19 0337  BP: (!) 131/91 99/67 127/74   Pulse: 63 63 62   Resp: 15 12 12    Temp:   (!) 97.5 F (36.4 C) 97.7 F (36.5 C)  TempSrc:   Oral   SpO2: 98% 97% 99%   Weight:   62.5 kg   Height:        Intake/Output Summary (Last 24 hours) at 04/11/2019 0735 Last data filed at 04/11/2019 0228 Gross per 24 hour  Intake 352.63 ml  Output 2025 ml  Net -1672.37 ml   Filed Weights   04/08/19 0427 04/11/19 0009 04/11/19 0228  Weight: 64.4 kg 64.2 kg 62.5 kg    Telemetry    Atrial fib with RVR- Personally Reviewed  ECG    NA - Personally Reviewed  Physical Exam   GEN: Moderately uncomfortable.   Neck: No  JVD Cardiac: IrregularRR, no  murmurs, rubs, or gallops.  Respiratory:   Decreased breath sounds bilaterally GI: Soft, nontender, non-distended, normal bowel sounds  MS:  No edema; No deformity. Neuro:   Nonfocal     Labs    Chemistry Recent Labs  Lab 04/09/19 1558 04/10/19 0406 04/11/19 0451  NA 139 140 142  K 5.3* 5.6* 4.5  CL 114* 113* 107  CO2 11* 10* 15*  GLUCOSE 129* 221* 172*  BUN 80* 93* 70*  CREATININE 7.51* 8.33* 5.74*  CALCIUM 7.5* 7.4* 7.4*  PROT 5.2* 5.0* 5.2*  ALBUMIN 1.4* 1.3* 1.5*  AST 85* 74* 93*  ALT 21 19 20   ALKPHOS 41 42 59  BILITOT 0.7 0.9 0.7  GFRNONAA 5* 5* 7*  GFRAA 6* 6* 9*  ANIONGAP 14 17* 20*     Hematology Recent Labs  Lab 04/08/19 1254 04/10/19 2359 04/11/19 0451  WBC 10.3 16.8* 18.1*  RBC 2.91* 2.92* 3.17*  HGB 7.9* 7.9* 8.5*  HCT 25.3* 24.2* 25.1*  MCV 86.9 82.9 79.2*  MCH 27.1 27.1 26.8  MCHC 31.2 32.6 33.9  RDW 18.6* 18.5* 17.9*  PLT 160 182 162    Cardiac EnzymesNo results for input(s): TROPONINI in the last 168 hours. No results for input(s): TROPIPOC in the last 168 hours.  BNP Recent Labs  Lab 04/08/19 0422  BNP 1,163.8*     DDimer  Recent Labs  Lab 04/09/19 0300 04/10/19 0406 04/11/19 0451  DDIMER 8.22* 7.92* 7.83*     Radiology    DG Chest Port 1 View  Result Date: 04/10/2019 CLINICAL DATA:  COVID-19 pneumonia, hypoxia EXAM: PORTABLE CHEST 1 VIEW COMPARISON:  04/08/2019 chest radiograph. FINDINGS: Stable cardiomediastinal silhouette with normal heart size. No pneumothorax. No pleural effusion. Severe patchy consolidation throughout both lungs, substantially worsened. IMPRESSION: Substantial worsening of severe patchy consolidation throughout both lungs compatible with COVID-19 pneumonia. Electronically Signed   By: Ilona Sorrel M.D.   On: 04/10/2019 14:32    Cardiac Studies   Echo pending.   Patient Profile     58 y.o. female  with a hx of HTN, HLD, poorly controled DM with neurophathy, FSGS with nephrotic syndrome,  tobacco smoking and CKD IV who is being seen for the evaluation of elevated troponin at the request of Dr. Nori Riis.   She has COVID 19.    Assessment & Plan    ELEVATED TROPONIN:  Might be demand ischemia.   I attempted a bedside echo to assess EF but images not optimal.  I will go ahead and order the formal echo.     ACUTE ON CHRONIC RENAL INSUFFICIENCY:  Now status post dialysis.    ACUTE RESPIRATORY FAILURE:   Now net negative with dialysis 1.15 liters.  Oxygenation is stable on high flow O2  ATRIAL FIB:  Agree with starting heparin.  I will change metoprolol to qid for rate control.      For questions or updates, please contact [place] Please consult [email or web address] for contact info under Cardiology/STEMI.   Signed, Minus Breeding, MD  04/11/2019, 7:35 AM

## 2019-04-11 NOTE — Progress Notes (Signed)
ANTICOAGULATION CONSULT NOTE - Initial Consult  Pharmacy Consult for heparin infusion Indication: atrial fibrillation  Patient Measurements: Height: 5\' 4"  (162.6 cm) Weight: 137 lb 12.6 oz (62.5 kg) IBW/kg (Calculated) : 54.7 Heparin Dosing Weight: 62.5 kg  Vital Signs: Temp: 97.5 F (36.4 C) (12/23 2002) Temp Source: Axillary (12/23 2002) BP: 98/80 (12/23 1600) Pulse Rate: 108 (12/23 1600)  Labs: Recent Labs    04/09/19 0105 04/09/19 0300 04/09/19 0820 04/09/19 1558 04/10/19 0406 04/10/19 1645 04/10/19 2359 04/11/19 0451 04/11/19 1533 04/11/19 1859  HGB  --    < >  --   --   --   --  7.9* 8.5* 8.6*  --   HCT  --   --   --   --   --   --  24.2* 25.1* 25.9*  --   PLT  --   --   --   --   --   --  182 162  --   --   LABPROT  --   --   --   --   --  15.0  --   --   --   --   INR  --   --   --   --   --  1.2  --   --   --   --   HEPARINUNFRC  --   --   --   --   --   --   --   --   --  1.26*  CREATININE  --   --   --  7.51* 8.33*  --   --  5.74*  --   --   TROPONINIHS 2,156*  --  1,139*  --   --   --   --   --  366*  --    < > = values in this interval not displayed.    Estimated Creatinine Clearance: 9.1 mL/min (A) (by C-G formula based on SCr of 5.74 mg/dL (H)).   Medical History: Past Medical History:  Diagnosis Date  . Acute hypoxemic respiratory failure due to COVID-19 ([place]) 04/11/2019  . Anemia of renal disease 06/21/2017  . Atrial fibrillation ([place]) 04/11/2019  . Chronic kidney disease (CKD), stage IV (severe) ([place]) 07/08/2017  . CKD (chronic kidney disease)   . Diabetes mellitus without complication ([place])   . Diabetic nephropathy ([place]) 04/11/2019  . Emphysema lung ([place]) 04/11/2019   Chest CT 09/30/17: Mild emphysematous changes within the lung apices, with associated biapical pleuroparenchymal scarring/thickening  . Fall 11/22/2018  . Focal segmental glomerulosclerosis with chronic glomerulonephritis 04/11/2019  . Frequent falls 07/22/2017  . Grief 09/08/2017   . Hematuria 01/20/2017   Noted on UA's in June & Oct 2018, possibly related to UTI [ ]  recheck UA at future visit  . Hemodialysis status ([place]) 04/11/2019  . Hyperlipidemia associated with type 2 diabetes mellitus ([place]) 10/26/2013   ASCVD score 9.1% as of 11/12/2016. Recommend high intensity statin. Allergy to atorvastatin (suicidal thoughts).  . Hypertension   . Nephrotic syndrome   . Neuropathy   . Pancreatitis 10/08/2016  . Pneumonia due to COVID-19 virus 04/11/2019  . Postherpetic neuralgia 09/08/2017  . Primary hypertension 02/03/2017  . Restless leg syndrome 03/03/2010   Qualifier: Diagnosis of  By: Kennon Rounds MD, Lavella Lemons    . Solitary pulmonary nodule 10/10/2017   Chest CT 09/30/17:  2. 3 mm pulmonary nodule within the RIGHT upper lobe. No follow-up needed if patient is low-risk. Non-contrast chest CT can  be considered in 12 months if patient is high-risk. Given the biapical emphysematous change, patient is likely high risk and the follow-up CT is likely indicated. This recommendation follows the consensus statement: Guidelines for Management of Incidental P  . Symptomatic anemia 04/05/2018  . Tobacco use disorder 06/16/2006   Every day smoker. Cigarettes only. Currently smoking 1/2 packs per day since age 67. Has 20-pack-year history. Previous cessation attempt in 1996 with success for 2 years until about the family member.  . Tremor   . Uncontrolled type 2 diabetes mellitus with peripheral neuropathy ([place]) 06/16/2006   Poorly controlled on Tresiba only. Previously on glipizide, glimpiride, and metformin.     Assessment: 58 yr old female starting on heparin for new Afib in the setting of COVID PNA and elevated D-dimer.  Patient was on heparin 7500 subcutaneous Q8 hr for COVID and high D-dimer, last dose at 0600 today. H/H 8.6/25.9, platelets 162 (low, but stable).  Heparin level drawn ~5 hrs after starting heparin infusion at 900 units/hr is 1.26 units/ml, which is above the goal range for this  pt. Per RN, no issues with IV; pt does have a small amount of bleeding around HD catheter site from yesterday, but it is stable and has not increased, per RN.  Goal of Therapy:  Heparin level 0.3-0.7 units/ml Monitor platelets by anticoagulation protocol: Yes   Plan:  Hold heparin infusion for 1 hr, then restart heparin at 700 units/hr Check 8-hr heparin level after restarting heparin Monitor daily heparin level, CBC Monitor for signs/symptoms of bleeding   Gillermina Hu, PharmD, BCPS, [facility] Clinical Pharmacist 04/11/19, 20:10 PM

## 2019-04-11 NOTE — Discharge Planning (Signed)
Referral initiated for the covid shift at [facility].Awaiting clearance.

## 2019-04-11 NOTE — Progress Notes (Addendum)
CRITICAL VALUE ALERT  Critical Value:  Troponin 366  Date & Time Notied:  04/11/19 1614  Provider Notified: Will notify McDiarmid's team Dr. Posey Pronto Orders Received/Actions taken: none

## 2019-04-12 ENCOUNTER — Inpatient Hospital Stay (HOSPITAL_COMMUNITY): Payer: Medicaid Other

## 2019-04-12 DIAGNOSIS — I4891 Unspecified atrial fibrillation: Secondary | ICD-10-CM

## 2019-04-12 DIAGNOSIS — J432 Centrilobular emphysema: Secondary | ICD-10-CM

## 2019-04-12 DIAGNOSIS — E872 Acidosis: Secondary | ICD-10-CM

## 2019-04-12 DIAGNOSIS — G9340 Encephalopathy, unspecified: Secondary | ICD-10-CM

## 2019-04-12 DIAGNOSIS — I639 Cerebral infarction, unspecified: Secondary | ICD-10-CM

## 2019-04-12 DIAGNOSIS — E8729 Other acidosis: Secondary | ICD-10-CM

## 2019-04-12 DIAGNOSIS — T68XXXA Hypothermia, initial encounter: Secondary | ICD-10-CM | POA: Diagnosis not present

## 2019-04-12 LAB — IRON AND TIBC: Iron: 74 ug/dL (ref 28–170)

## 2019-04-12 LAB — CBC
HCT: 24.3 % — ABNORMAL LOW (ref 36.0–46.0)
Hemoglobin: 7.9 g/dL — ABNORMAL LOW (ref 12.0–15.0)
MCH: 26.1 pg (ref 26.0–34.0)
MCHC: 32.5 g/dL (ref 30.0–36.0)
MCV: 80.2 fL (ref 80.0–100.0)
Platelets: 170 10*3/uL (ref 150–400)
RBC: 3.03 MIL/uL — ABNORMAL LOW (ref 3.87–5.11)
RDW: 18 % — ABNORMAL HIGH (ref 11.5–15.5)
WBC: 16 10*3/uL — ABNORMAL HIGH (ref 4.0–10.5)
nRBC: 1.6 % — ABNORMAL HIGH (ref 0.0–0.2)

## 2019-04-12 LAB — BLOOD GAS, ARTERIAL
Acid-base deficit: 12.2 mmol/L — ABNORMAL HIGH (ref 0.0–2.0)
Bicarbonate: 14 mmol/L — ABNORMAL LOW (ref 20.0–28.0)
Drawn by: 39899
FIO2: 80
O2 Saturation: 90.2 %
Patient temperature: 35.2
pCO2 arterial: 32.5 mmHg (ref 32.0–48.0)
pH, Arterial: 7.246 — ABNORMAL LOW (ref 7.350–7.450)
pO2, Arterial: 64.7 mmHg — ABNORMAL LOW (ref 83.0–108.0)

## 2019-04-12 LAB — COMPREHENSIVE METABOLIC PANEL
ALT: 17 U/L (ref 0–44)
AST: 66 U/L — ABNORMAL HIGH (ref 15–41)
Albumin: 1.2 g/dL — ABNORMAL LOW (ref 3.5–5.0)
Alkaline Phosphatase: 64 U/L (ref 38–126)
Anion gap: 20 — ABNORMAL HIGH (ref 5–15)
BUN: 95 mg/dL — ABNORMAL HIGH (ref 6–20)
CO2: 15 mmol/L — ABNORMAL LOW (ref 22–32)
Calcium: 7.3 mg/dL — ABNORMAL LOW (ref 8.9–10.3)
Chloride: 106 mmol/L (ref 98–111)
Creatinine, Ser: 6.9 mg/dL — ABNORMAL HIGH (ref 0.44–1.00)
GFR calc Af Amer: 7 mL/min — ABNORMAL LOW (ref >60)
GFR calc non Af Amer: 6 mL/min — ABNORMAL LOW (ref >60)
Glucose, Bld: 275 mg/dL — ABNORMAL HIGH (ref 70–99)
Potassium: 5 mmol/L (ref 3.5–5.1)
Sodium: 141 mmol/L (ref 135–145)
Total Bilirubin: 0.4 mg/dL (ref 0.3–1.2)
Total Protein: 5 g/dL — ABNORMAL LOW (ref 6.5–8.1)

## 2019-04-12 LAB — FERRITIN: Ferritin: 7097 ng/mL — ABNORMAL HIGH (ref 11–307)

## 2019-04-12 LAB — GLUCOSE, CAPILLARY
Glucose-Capillary: 158 mg/dL — ABNORMAL HIGH (ref 70–99)
Glucose-Capillary: 199 mg/dL — ABNORMAL HIGH (ref 70–99)
Glucose-Capillary: 214 mg/dL — ABNORMAL HIGH (ref 70–99)
Glucose-Capillary: 224 mg/dL — ABNORMAL HIGH (ref 70–99)
Glucose-Capillary: 232 mg/dL — ABNORMAL HIGH (ref 70–99)

## 2019-04-12 LAB — MRSA PCR SCREENING: MRSA by PCR: NEGATIVE

## 2019-04-12 LAB — D-DIMER, QUANTITATIVE: D-Dimer, Quant: 5.5 ug/mL-FEU — ABNORMAL HIGH (ref 0.00–0.50)

## 2019-04-12 LAB — RENAL FUNCTION PANEL
Albumin: 1.2 g/dL — ABNORMAL LOW (ref 3.5–5.0)
Anion gap: 16 — ABNORMAL HIGH (ref 5–15)
BUN: 103 mg/dL — ABNORMAL HIGH (ref 6–20)
CO2: 14 mmol/L — ABNORMAL LOW (ref 22–32)
Calcium: 6.8 mg/dL — ABNORMAL LOW (ref 8.9–10.3)
Chloride: 111 mmol/L (ref 98–111)
Creatinine, Ser: 6.66 mg/dL — ABNORMAL HIGH (ref 0.44–1.00)
GFR calc Af Amer: 7 mL/min — ABNORMAL LOW (ref >60)
GFR calc non Af Amer: 6 mL/min — ABNORMAL LOW (ref >60)
Glucose, Bld: 229 mg/dL — ABNORMAL HIGH (ref 70–99)
Phosphorus: 10.7 mg/dL — ABNORMAL HIGH (ref 2.5–4.6)
Potassium: 4.5 mmol/L (ref 3.5–5.1)
Sodium: 141 mmol/L (ref 135–145)

## 2019-04-12 LAB — PHOSPHORUS: Phosphorus: 11.2 mg/dL — ABNORMAL HIGH (ref 2.5–4.6)

## 2019-04-12 LAB — LACTIC ACID, PLASMA
Lactic Acid, Venous: 1.7 mmol/L (ref 0.5–1.9)
Lactic Acid, Venous: 1.8 mmol/L (ref 0.5–1.9)

## 2019-04-12 LAB — HEPARIN LEVEL (UNFRACTIONATED): Heparin Unfractionated: 1.38 IU/mL — ABNORMAL HIGH (ref 0.30–0.70)

## 2019-04-12 LAB — C-REACTIVE PROTEIN: CRP: 23.1 mg/dL — ABNORMAL HIGH (ref <1.0)

## 2019-04-12 MED ORDER — IPRATROPIUM-ALBUTEROL 20-100 MCG/ACT IN AERS
1.0000 | INHALATION_SPRAY | Freq: Four times a day (QID) | RESPIRATORY_TRACT | Status: DC | PRN
  Filled 2019-04-12: qty 4

## 2019-04-12 MED ORDER — INSULIN GLARGINE 100 UNIT/ML ~~LOC~~ SOLN
10.0000 [IU] | Freq: Every day | SUBCUTANEOUS | Status: DC
  Filled 2019-04-12: qty 0.1

## 2019-04-12 MED ORDER — SODIUM CHLORIDE 0.9 % IV SOLN
1.0000 g | INTRAVENOUS | Status: DC
  Administered 2019-04-12: 1 g via INTRAVENOUS
  Filled 2019-04-12 (×2): qty 1

## 2019-04-12 MED ORDER — STROKE: EARLY STAGES OF RECOVERY BOOK
Freq: Once | Status: AC
  Filled 2019-04-12: qty 1

## 2019-04-12 MED ORDER — CHLORHEXIDINE GLUCONATE 0.12 % MT SOLN
15.0000 mL | Freq: Two times a day (BID) | OROMUCOSAL | Status: DC
  Administered 2019-04-12 – 2019-04-14 (×5): 15 mL via OROMUCOSAL
  Filled 2019-04-12 (×4): qty 15

## 2019-04-12 MED ORDER — INSULIN GLARGINE 100 UNIT/ML ~~LOC~~ SOLN
10.0000 [IU] | Freq: Every day | SUBCUTANEOUS | Status: DC
  Administered 2019-04-12 – 2019-04-14 (×3): 10 [IU] via SUBCUTANEOUS
  Filled 2019-04-12 (×5): qty 0.1

## 2019-04-12 MED ORDER — AMIODARONE HCL IN DEXTROSE 360-4.14 MG/200ML-% IV SOLN
60.0000 mg/h | INTRAVENOUS | Status: AC
  Administered 2019-04-12 (×2): 60 mg/h via INTRAVENOUS

## 2019-04-12 MED ORDER — PRISMASOL BGK 4/2.5 32-4-2.5 MEQ/L REPLACEMENT SOLN
Status: DC
  Filled 2019-04-12 (×5): qty 5000

## 2019-04-12 MED ORDER — VITAMIN C 250 MG PO TABS
500.0000 mg | ORAL_TABLET | Freq: Every day | ORAL | Status: DC
  Filled 2019-04-12: qty 2

## 2019-04-12 MED ORDER — AMIODARONE LOAD VIA INFUSION
150.0000 mg | Freq: Once | INTRAVENOUS | Status: AC
  Administered 2019-04-12: 150 mg via INTRAVENOUS
  Filled 2019-04-12: qty 83.34

## 2019-04-12 MED ORDER — INSULIN ASPART 100 UNIT/ML ~~LOC~~ SOLN
0.0000 [IU] | SUBCUTANEOUS | Status: DC
  Administered 2019-04-12: 3 [IU] via SUBCUTANEOUS
  Administered 2019-04-12: 2 [IU] via SUBCUTANEOUS
  Administered 2019-04-12: 3 [IU] via SUBCUTANEOUS
  Administered 2019-04-13: 1 [IU] via SUBCUTANEOUS
  Administered 2019-04-13: 2 [IU] via SUBCUTANEOUS
  Administered 2019-04-13 (×2): 1 [IU] via SUBCUTANEOUS
  Administered 2019-04-14 (×2): 2 [IU] via SUBCUTANEOUS
  Administered 2019-04-14 (×5): 1 [IU] via SUBCUTANEOUS
  Administered 2019-04-15 (×2): 2 [IU] via SUBCUTANEOUS

## 2019-04-12 MED ORDER — IOHEXOL 350 MG/ML SOLN
100.0000 mL | Freq: Once | INTRAVENOUS | Status: AC | PRN
  Administered 2019-04-12: 100 mL via INTRAVENOUS

## 2019-04-12 MED ORDER — AMIODARONE HCL IN DEXTROSE 360-4.14 MG/200ML-% IV SOLN
30.0000 mg/h | INTRAVENOUS | Status: DC
  Administered 2019-04-12: 30 mg/h via INTRAVENOUS
  Filled 2019-04-12 (×2): qty 200

## 2019-04-12 MED ORDER — HEPARIN SODIUM (PORCINE) 1000 UNIT/ML IJ SOLN: 3.8000 mL | Freq: Once | INTRAMUSCULAR | Status: DC

## 2019-04-12 MED ORDER — PRISMASOL BGK 4/2.5 32-4-2.5 MEQ/L REPLACEMENT SOLN
Status: DC
  Filled 2019-04-12 (×9): qty 5000

## 2019-04-12 MED ORDER — SODIUM CHLORIDE 0.9 % FOR CRRT
INTRAVENOUS_CENTRAL | Status: DC | PRN
  Administered 2019-04-13: 06:00:00 1000 mL via INTRAVENOUS_CENTRAL
  Filled 2019-04-12 (×4): qty 1000

## 2019-04-12 MED ORDER — VANCOMYCIN HCL IN DEXTROSE 1-5 GM/200ML-% IV SOLN
1000.0000 mg | Freq: Once | INTRAVENOUS | Status: AC
  Administered 2019-04-12: 1000 mg via INTRAVENOUS
  Filled 2019-04-12: qty 200

## 2019-04-12 MED ORDER — MAGNESIUM SULFATE 2 GM/50ML IV SOLN
2.0000 g | Freq: Once | INTRAVENOUS | Status: AC
  Administered 2019-04-12: 2 g via INTRAVENOUS
  Filled 2019-04-12: qty 50

## 2019-04-12 MED ORDER — ZINC SULFATE 220 (50 ZN) MG PO CAPS: 220.0000 mg | ORAL_CAPSULE | Freq: Every day | ORAL | Status: DC

## 2019-04-12 MED ORDER — HEPARIN SODIUM (PORCINE) 1000 UNIT/ML IJ SOLN
INTRAMUSCULAR | Status: AC
  Filled 2019-04-12: qty 4

## 2019-04-12 MED ORDER — VANCOMYCIN HCL 500 MG/100ML IV SOLN
500.0000 mg | INTRAVENOUS | Status: DC
  Filled 2019-04-12: qty 100

## 2019-04-12 MED ORDER — PRISMASOL BGK 4/2.5 32-4-2.5 MEQ/L IV SOLN
INTRAVENOUS | Status: DC
  Filled 2019-04-12 (×25): qty 5000

## 2019-04-12 MED ORDER — PANTOPRAZOLE SODIUM 40 MG IV SOLR
40.0000 mg | Freq: Two times a day (BID) | INTRAVENOUS | Status: DC
  Administered 2019-04-12 – 2019-04-13 (×3): 40 mg via INTRAVENOUS
  Filled 2019-04-12 (×3): qty 40

## 2019-04-12 MED ORDER — HEPARIN SODIUM (PORCINE) 1000 UNIT/ML DIALYSIS
1000.0000 [IU] | INTRAMUSCULAR | Status: DC | PRN
  Administered 2019-04-13: 23:00:00 4000 [IU] via INTRAVENOUS_CENTRAL
  Filled 2019-04-12: qty 4
  Filled 2019-04-12 (×2): qty 6

## 2019-04-12 MED ORDER — ORAL CARE MOUTH RINSE
15.0000 mL | Freq: Two times a day (BID) | OROMUCOSAL | Status: DC
  Administered 2019-04-13 – 2019-04-14 (×4): 15 mL via OROMUCOSAL

## 2019-04-12 MED ORDER — METOPROLOL TARTRATE 5 MG/5ML IV SOLN
2.5000 mg | Freq: Four times a day (QID) | INTRAVENOUS | Status: DC
  Administered 2019-04-12 (×2): 2.5 mg via INTRAVENOUS
  Filled 2019-04-12 (×2): qty 5

## 2019-04-12 MED ORDER — SODIUM CHLORIDE 0.9 % IV SOLN: 1.0000 g | INTRAVENOUS | Status: DC

## 2019-04-12 MED ORDER — DARBEPOETIN ALFA 100 MCG/0.5ML IJ SOSY
PREFILLED_SYRINGE | INTRAMUSCULAR | Status: AC
  Filled 2019-04-12: qty 0.5

## 2019-04-12 NOTE — Progress Notes (Signed)
I see that patients status is worsening and she is being moved to 31M.  She appears too unstable for IHD at this point.  The best way to get her better faster from her uremia and volume overload is to do CRRT.  I have informed  the charge nurse for 3 M so he can plan for staffing   Louis Meckel

## 2019-04-12 NOTE — Progress Notes (Addendum)
Pharmacy Antibiotic Note  Andrea Glenn is a 58 y.o. female admitted on 04/16/2019 with sepsis.  Pharmacy has been consulted for vancomycin/cefepime dosing.  Patient was on treatment for COVID + and kelbsiella UTI when developed AMS on 12/24. Temp down to 95; WBC 18.1>16, CRP peaked at 24> 23.1; LA 1> 1.7; D-Dimer 7.8> 5.5; HR up to 110s, BP low normal. Patient was started on dialysis 12/23 and will get a net even run today.   [id number] worsening of severe patchy consolidation throughout both lungs compatible with COVID-19 pneumonia  ADDENDUM: Transferred to 90M and starting CRRT. Will dose according to dialysis tolerance and modality.  Plan: Vancomycin 1g load then 500mg  Q24 hr F/u dialysis schedule and adjust vancomycin as needed Consider ordering vancomycin levels  Start cefepime 1g Q24hr, give after dialysis if possible F/u cultures, clinical status, LOT Narrow antibiotics as able    Height: 5\' 4"  (162.6 cm) Weight: 137 lb 12.6 oz (62.5 kg) IBW/kg (Calculated) : 54.7  Temp (24hrs), Avg:97.2 F (36.2 C), Min:95.2 F (35.1 C), Max:97.9 F (36.6 C)  Recent Labs  Lab 03/28/2019 2359 04/08/19 0422 04/08/19 1254 04/09/19 0300 04/09/19 1558 04/10/19 0406 04/10/19 2359 04/11/19 0451 04/12/19 0710 04/12/19 0855  WBC 11.4*  --  10.3  --   --   --  16.8* 18.1* 16.0*  --   CREATININE 6.95*  --  6.90* 7.26* 7.51* 8.33*  --  5.74* 6.90*  --   LATICACIDVEN  --  1.0  --   --   --   --   --   --   --  1.7    Estimated Creatinine Clearance: 7.6 mL/min (A) (by C-G formula based on SCr of 6.9 mg/dL (H)).     Antimicrobials this admission: Vanc 12/24 >> Cefe 12/24 >> CTX 12/21 >>12/23 Remdesevir 12/20 >> 12/25  Microbiology results: 12/24 Bcx pend 12/20 BCx 1/4 bacillus sp, likely contaminant 12/20 UCx >> Kleb R amp, nitrofur  Thank you for allowing pharmacy to be a part of this patient's care.   Benetta Spar, PharmD, BCPS, BCCP Clinical Pharmacist  Please check  AMION for all [place] phone numbers After 10:00 PM, call [place] [phone]

## 2019-04-12 NOTE — Consult Note (Addendum)
Neurology Consultation Reason for Consult: Stroke Referring Physician: Julio Alm  CC: Altered mental status  History is obtained from: Chart review  HPI: Andrea Glenn is a 58 y.o. female with history of diabetes, CKD, atrial fibrillation admitted with worsening edema, fevers, cough was found to be positive for COVID-19. She was having worsening renal failure and elevated troponin, 2 days ago she had dialysis catheter placed. Yesterday she was reported as somnolent but answering questions readily. She had atrial fibrillation and started on anticoagulation with heparin yesterday. Today, it was noted that she had decreased responsiveness and a stat head CT was obtained which demonstrates bilateral cerebellar hypointensities. She initially was unresponsive to anything but painful stimulation, she has since improved to agitation/confusion.   LKW: Unclear as she was somnolent even already yesterday. tpa given?: no, already on anticoagulation    ROS: Unable to obtain due to altered mental status.  Past Medical History:  Diagnosis Date  . Acute hypoxemic respiratory failure due to COVID-19 ([place]) 04/11/2019  . Anemia of renal disease 06/21/2017  . Atrial fibrillation ([place]) 04/11/2019  . Chronic kidney disease (CKD), stage IV (severe) ([place]) 07/08/2017  . CKD (chronic kidney disease)   . Diabetes mellitus without complication ([place])   . Diabetic nephropathy ([place]) 04/11/2019  . Emphysema lung ([place]) 04/11/2019   Chest CT 09/30/17: Mild emphysematous changes within the lung apices, with associated biapical pleuroparenchymal scarring/thickening  . Fall 11/22/2018  . Focal segmental glomerulosclerosis with chronic glomerulonephritis 04/11/2019  . Frequent falls 07/22/2017  . Grief 09/08/2017  . Hematuria 01/20/2017   Noted on UA's in June & Oct 2018, possibly related to UTI [ ]  recheck UA at future visit  . Hemodialysis status ([place]) 04/11/2019  . Hyperlipidemia associated with type 2 diabetes  mellitus ([place]) 10/26/2013   ASCVD score 9.1% as of 11/12/2016. Recommend high intensity statin. Allergy to atorvastatin (suicidal thoughts).  . Hypertension   . Nephrotic syndrome   . Neuropathy   . Pancreatitis 10/08/2016  . Pneumonia due to COVID-19 virus 04/11/2019  . Postherpetic neuralgia 09/08/2017  . Primary hypertension 02/03/2017  . Restless leg syndrome 03/03/2010   Qualifier: Diagnosis of  By: Kennon Rounds MD, Lavella Lemons    . Solitary pulmonary nodule 10/10/2017   Chest CT 09/30/17:  2. 3 mm pulmonary nodule within the RIGHT upper lobe. No follow-up needed if patient is low-risk. Non-contrast chest CT can be considered in 12 months if patient is high-risk. Given the biapical emphysematous change, patient is likely high risk and the follow-up CT is likely indicated. This recommendation follows the consensus statement: Guidelines for Management of Incidental P  . Symptomatic anemia 04/05/2018  . Tobacco use disorder 06/16/2006   Every day smoker. Cigarettes only. Currently smoking 1/2 packs per day since age 83. Has 20-pack-year history. Previous cessation attempt in 1996 with success for 2 years until about the family member.  . Tremor   . Uncontrolled type 2 diabetes mellitus with peripheral neuropathy ([place]) 06/16/2006   Poorly controlled on Tresiba only. Previously on glipizide, glimpiride, and metformin.      Family History  Problem Relation Age of Onset  . Kidney failure Mother   . Diabetes Mother   . Thyroid disease Mother   . Hypertension Mother   . Heart failure Mother   . Kidney failure Father   . Cancer Sister   . Kidney failure Sister   . Stroke Brother   . Cancer Other   . Kidney failure Maternal Grandmother   . Kidney failure  Maternal Grandfather   . Kidney failure Sister   . Heart attack Sister      Social History:  reports that she has been smoking cigarettes. She started smoking about 42 years ago. She has a 2.00 pack-year smoking history. She has never used smokeless  tobacco. She reports that she does not drink alcohol or use drugs.   Exam: Current vital signs: BP (!) 114/99   Pulse (!) 114   Temp (!) 95.7 F (35.4 C)   Resp 11   Ht 5\' 4"  (1.626 m)   Wt 62.5 kg   SpO2 99%   BMI 23.65 kg/m  Vital signs in last 24 hours: Temp:  [95.2 F (35.1 C)-97.6 F (36.4 C)] 95.7 F (35.4 C) (12/24 1030) Pulse Rate:  [74-124] 114 (12/24 1245) Resp:  [10-26] 11 (12/24 1400) BP: (76-114)/(54-99) 114/99 (12/24 1400) SpO2:  [90 %-100 %] 99 % (12/24 1330)   Physical Exam  Constitutional: Appears well-developed and well-nourished.  Psych: Affect appropriate to situation Eyes: No scleral injection HENT: No OP obstrucion MSK: no joint deformities.  Cardiovascular: Normal rate and regular rhythm.  Respiratory: Effort normal, non-labored breathing GI: Soft.  No distension. There is no tenderness.  Skin: WDI  Neuro: Mental Status: Patient is arousable, but does not follow commands. She does resist the examiner and push me away. Cranial Nerves: II: She does not clearly blink to threat pupils are equal, round, and reactive to light.   III,IV, VI: Doll's eye intact V: VII: Blinks to eyelid stimulation bilaterally Motor: She moves all extremities spontaneously, I do question whether she is moving the left less than the right, but I do see her lift up against gravity. Sensory: She response to noxious stimulation in all 4 extremities Cerebellar: She does not perform  I have reviewed labs in epic and the results pertinent to this consultation are: PCO2 32  I have reviewed the images obtained: CT head-acute appearing infarcts in the cerebellum  Impression: 58 year old female with abrupt change in mental status in the setting of worsening renal function and embolic appearing infarcts. I am concerned that she may have had an embolic shower and an MRI will be needed to confirm this. Also, her basilar will need to be evaluated though at this time she has no  brainstem findings to suggest basilar occlusion. She is also having some bleeding from her procedure site.  Recommendations: - HgbA1c, fasting lipid panel - MRI  of the brain without contrast - Frequent neuro checks - Echocardiogram - Carotid dopplers - Prophylactic therapy-Antiplatelet med: Aspirin for now pending MRI. - Risk factor modification - Telemetry monitoring - PT consult, OT consult, Speech consult - Stroke team to follow   Roland Rack, MD [facility] [phone]  If 7pm- 7am, please page neurology on call as listed in [place].

## 2019-04-12 NOTE — Progress Notes (Signed)
OT Cancellation Note  Patient Details Name: Andrea Glenn MRN: [id number] DOB: November 27, 1960   Cancelled Treatment:    Reason Eval/Treat Not Completed: Patient at procedure or test/ unavailable.  Attempted x 2 - pt returning from CT, and then having line placed.  Now with possible transfer to ICU due to new infarcts, AMS, and acute hypoxic Respiratory failure.  Nilsa Nutting., OTR/L Acute Rehabilitation Services Pager [phone] Office [phone]      Lucille Passy M 04/12/2019, 12:26 PM

## 2019-04-12 NOTE — Progress Notes (Signed)
eLink Physician-Brief Progress Note Patient Name: Andrea Glenn DOB: 1961/04/02 MRN: [id number]   Date of Service  04/12/2019  HPI/Events of Note  Sinus bradycardia after conversion I/s/o amiodarone drip/bolus. Hemodynamically stable relative to prior to conversion.  eICU Interventions  RN has held amiodarone drip which is reasonable. Has AV nodal agents on board. If Afib w/ RVR recurs we may need to resume amiodarone but will defer until such time.     Intervention Category Intermediate Interventions: Arrhythmia - evaluation and management  Marily Lente Janathan Bribiesca 04/12/2019, 10:32 PM

## 2019-04-12 NOTE — Progress Notes (Signed)
Paged by nurse that patient's tunnel cath site now bleeding.  Notes that she was moving her arms a lot, coming back after CT and had to be put in mittens.  Nurse reports bleeding into about 1/5th of bandage, not gushing blood.  She is having IV team come to assist.  Hep gtt is off.  Will continue to monitor.  If bleeding increases, will order stat H&H.  No need for protamine at present.    [place] Constable, D.O.  PGY-2 Family Medicine  04/12/2019 10:55 AM

## 2019-04-12 NOTE — Consult Note (Addendum)
NAME:  ODILE VELOSO, MRN:  [id number], DOB:  1960/08/11, LOS: 4 ADMISSION DATE:  03/25/2019, CONSULTATION DATE:  04/12/2019 REFERRING MD: Dolores Hoose CHIEF COMPLAINT:  Hypoxia, AMS    Brief History   58 year old with multiple comorbidities admitted 12/20 with sepsis due to COVID-19 pneumonia and urinary tract infection complicated by acute on chronic kidney disease now on dialysis with altered mental status, hypotension and hypoxia.  PCCM consulted for management assistance.  History of present illness   58 year old female past medical history of focal segmental glomerular nephrotic syndrome with CKD stage IV now progressed to end-stage renal disease on dialysis this admission, tobacco dependence, diabetes mellitus poor control, hypertension, venous stasis he was admitted on 12/20 with sepsis secondary to Klebsiella urinary tract infection that was associated with weakness and unsteady gait.  Her hospitalization has been complicated by [id number] pneumonia with worsening hypoxia, elevated troponin for which cardiology was consulted, also developed atrial fibrillation.  On 12/24 she developed altered mental status prompting stat CT of the brain which revealed low density in the right cerebellar hemisphere concerning for acute to subacute infarcts.  Neurology consulted.  She was quite encephalopathic and pulled at her right tunneled HD cath causing area to bleed.  She also became hypotensive.  PCCM consulted for management assistance.  Past Medical History   Past Medical History:  Diagnosis Date  . Acute hypoxemic respiratory failure due to COVID-19 ([place]) 04/11/2019  . Anemia of renal disease 06/21/2017  . Atrial fibrillation ([place]) 04/11/2019  . Chronic kidney disease (CKD), stage IV (severe) ([place]) 07/08/2017  . CKD (chronic kidney disease)   . Diabetes mellitus without complication ([place])   . Diabetic nephropathy ([place]) 04/11/2019  . Emphysema lung ([place]) 04/11/2019   Chest CT 09/30/17: Mild emphysematous  changes within the lung apices, with associated biapical pleuroparenchymal scarring/thickening  . Fall 11/22/2018  . Focal segmental glomerulosclerosis with chronic glomerulonephritis 04/11/2019  . Frequent falls 07/22/2017  . Grief 09/08/2017  . Hematuria 01/20/2017   Noted on UA's in June & Oct 2018, possibly related to UTI '[ ]'  recheck UA at future visit  . Hemodialysis status ([place]) 04/11/2019  . Hyperlipidemia associated with type 2 diabetes mellitus ([place]) 10/26/2013   ASCVD score 9.1% as of 11/12/2016. Recommend high intensity statin. Allergy to atorvastatin (suicidal thoughts).  . Hypertension   . Nephrotic syndrome   . Neuropathy   . Pancreatitis 10/08/2016  . Pneumonia due to COVID-19 virus 04/11/2019  . Postherpetic neuralgia 09/08/2017  . Primary hypertension 02/03/2017  . Restless leg syndrome 03/03/2010   Qualifier: Diagnosis of  By: Kennon Rounds MD, Lavella Lemons    . Solitary pulmonary nodule 10/10/2017   Chest CT 09/30/17:  2. 3 mm pulmonary nodule within the RIGHT upper lobe. No follow-up needed if patient is low-risk. Non-contrast chest CT can be considered in 12 months if patient is high-risk. Given the biapical emphysematous change, patient is likely high risk and the follow-up CT is likely indicated. This recommendation follows the consensus statement: Guidelines for Management of Incidental P  . Symptomatic anemia 04/05/2018  . Tobacco use disorder 06/16/2006   Every day smoker. Cigarettes only. Currently smoking 1/2 packs per day since age 27. Has 20-pack-year history. Previous cessation attempt in 1996 with success for 2 years until about the family member.  . Tremor   . Uncontrolled type 2 diabetes mellitus with peripheral neuropathy ([place]) 06/16/2006   Poorly controlled on Tresiba only. Previously on glipizide, glimpiride, and metformin.      Significant Hospital Events  12/20 Admitted   Consults:  Nephrology Neurology PCCM Cardiology  Procedures:  12/22 right IJ tunneled HD  catheter  Significant Diagnostic Tests:  12/23   1. Left ventricular ejection fraction, by visual estimation, is 50 to 55%. The left ventricle has normal function. There is no left ventricular hypertrophy.  2. Left ventricular diastolic function could not be evaluated.  3. The left ventricle has no regional wall motion abnormalities.  4. Global right ventricle has normal systolic function.The right ventricular size is normal.  5. Left atrial size was normal.  6. Right atrial size was normal.  7. The mitral valve is normal in structure. Trivial mitral valve regurgitation. No evidence of mitral stenosis.  8. The tricuspid valve is normal in structure.  9. The aortic valve is tricuspid. Aortic valve regurgitation is not visualized. Mild aortic valve sclerosis without stenosis. 10. The pulmonic valve was normal in structure. Pulmonic valve regurgitation is not visualized. 11. The inferior vena cava is normal in size with greater than 50% respiratory variability, suggesting right atrial pressure of 3 mmHg. 12. HR 130-144 at time of study; normal LV function; trace MR and TR; catheter in right atrium.  12/24 CT brain IMPRESSION: Areas of low-density in the right cerebellar hemisphere concerning for acute to subacute infarcts.  Chronic appearing bilateral basal ganglia lacunar infarcts.  12/24 CXR Severe diffuse bilateral infiltrates and airspace disease.  Right IJ dialysis catheter in place. Micro Data:  SARS Coronavirus 2 POSITIVE  12/20 Blood>> Bacillus  12/20 Urine> Klebsiella-ampicillin resistant, nitrofurantoin intermediate 12/24 Blood>>   Antimicrobials/COVID19 treatment  Azithromycin 12/19 x 1 dose Ceftriaxone 12/19>12/21, 12/23>12/24 Cefazolin 12/22 SCIP Cefepime 12/24>> Remdesivir 12/20>12/24 Dexamethasone 12/20>> Vancomycin 12/24>>  Interim history/subjective:  Patient is seen and evaluated in 5W19.  She moans to noxious stimuli only.  Does not follow commands.   Appears acutely ill.  SPO2 88% on 15 L via high flow nasal cannula  Objective   Blood pressure 99/75, pulse 92, temperature (!) 95.7 F (35.4 C), resp. rate 16, height '5\' 4"'  (1.626 m), weight 62.5 kg, SpO2 96 %.        Intake/Output Summary (Last 24 hours) at 04/12/2019 1139 Last data filed at 04/12/2019 0715 Gross per 24 hour  Intake 343.33 ml  Output 325 ml  Net 18.33 ml   Filed Weights   04/08/19 0427 04/11/19 0009 04/11/19 0228  Weight: 64.4 kg 64.2 kg 62.5 kg    Examination: General: Acutely and chronically ill-appearing female, lying supine in bed, peers older than chronological age HENT: Normocephalic.  She has a cat like contact lenses in place therefore unable to assess her pupils, her eyes do not track.  Dry mucous membranes Neck: No JVD. Trachea midline. CV: RRR. S1S2. No MRG. +2 radial pulses.  Lungs: BBS diminished at bases, upper lobe rhonchi, tachypneic, slightly shallow effort, NL, symmetrical ABD: +BS x4. SNT/ND. No masses, guarding or rigidity GU: No Foley EXT: She moves her upper extremities spontaneously and listen to her face.  She does not move her lower extremities.  She has bilateral lower extremity edema.  Chronic venous stasis changes over lower extremities Skin: Pale, cool, dry.  Lower extremities have bilateral clean dressing to same.  Clean dry intact.  Chronic venous stasis changes over lower extremities Neuro: She moans and withdraws to noxious stimuli only.  She does not follow commands.  She does move her arms purposefully to her face    Resolved Hospital Problem list   NA  Assessment & Plan:  This is a 58 year old female with acute hypoxic respiratory failure and severe sepsis secondary to COVID-19 pneumonia and Klebsiella urinary tract infection with possible superimposed bacterial pneumonia, possibly both gram-negative and gram-positive organisms, with acute encephalopathy likely secondary to acute/subacute CVA in the setting of now end-stage  renal disease.   Acute hypoxic respiratory failure secondary to COVID-19 pneumonia with possible superimposed bacterial pneumonia Plan: Transfer to ICU for closer monitoring.  She is at high risk for intubation. Maintain SpO2 goal 88% or higher She has completed remdesivir treatment.  She is on Decadron. Add vitamin C and zinc Chest x-ray and ABG as needed Continue antibiotics as escalated today Follow-up repeat cultures  Encephalopathy likely secondary to acute/subacute CVA Plan: Neurology has been consulted Plan is for CTA  Urinary tract infection Plan: Continue cefepime-today is day 5 of antibiotics for UTI  Acute kidney on CKD stage IV now end-stage renal disease on hemodialysis Plan: Nephrology following   Elevated troponin New onset atrial fibrillation EF 50 to 82%, no diastolic dysfunction Plan: Patient has been started on heparin drip for anticoagulation which was held this morning due to altered mental status and concern for head bleed.  Will await findings of CTA and consider resumption. Cardiology is following  Diabetes mellitus type II with poor control Plan: Change sliding scale insulin to every 4 hours Continue Levemir May need to uptitrate  Acute on chronic anemia-likely of chronic kidney disease No signs and symptoms of bleeding Plan: Continue to monitor  Best practice:  Diet: N.p.o. insert feeding tube.  Consult nutrition for TF recs Pain/Anxiety/Delirium protocol (if indicated): Monitor for needs VAP protocol (if indicated): Currently not indicated DVT prophylaxis: SCDs, +/-resumption of heparin drip will follow up after CT GI prophylaxis: PPI Glucose control: Sliding scale insulin, Levemir Mobility: Bedrest Code Status: Full Family Communication: Family medicine has attempted to contact family multiple times without success.  We will continue to do so Disposition: Transfer to ICU  Labs   CBC: Recent Labs  Lab 04/12/2019 2359 04/08/19 1254  04/10/19 2359 04/11/19 0451 04/11/19 1533 04/12/19 0710  WBC 11.4* 10.3 16.8* 18.1*  --  16.0*  NEUTROABS 9.8*  --   --   --   --   --   HGB 9.4* 7.9* 7.9* 8.5* 8.6* 7.9*  HCT 30.7* 25.3* 24.2* 25.1* 25.9* 24.3*  MCV 87.2 86.9 82.9 79.2*  --  80.2  PLT 177 160 182 162  --  800    Basic Metabolic Panel: Recent Labs  Lab 04/09/19 0300 04/09/19 1558 04/10/19 0406 04/11/19 0451 04/12/19 0710  NA 139 139 140 142 141  K 5.5* 5.3* 5.6* 4.5 5.0  CL 115* 114* 113* 107 106  CO2 11* 11* 10* 15* 15*  GLUCOSE 176* 129* 221* 172* 275*  BUN 75* 80* 93* 70* 95*  CREATININE 7.26* 7.51* 8.33* 5.74* 6.90*  CALCIUM 7.8* 7.5* 7.4* 7.4* 7.3*  PHOS  --   --   --  8.1* 11.2*   GFR: Estimated Creatinine Clearance: 7.6 mL/min (A) (by C-G formula based on SCr of 6.9 mg/dL (H)). Recent Labs  Lab 04/08/19 0422 04/08/19 1254 04/10/19 2359 04/11/19 0451 04/12/19 0710 04/12/19 0855  PROCALCITON  --  2.02  --   --   --   --   WBC  --  10.3 16.8* 18.1* 16.0*  --   LATICACIDVEN 1.0  --   --   --   --  1.7    Liver Function Tests: Recent Labs  Lab  04/09/19 0300 04/09/19 1558 04/10/19 0406 04/11/19 0451 04/12/19 0710  AST 94* 85* 74* 93* 66*  ALT '23 21 19 20 17  ' ALKPHOS 46 41 42 59 64  BILITOT 0.7 0.7 0.9 0.7 0.4  PROT 5.2* 5.2* 5.0* 5.2* 5.0*  ALBUMIN 1.5* 1.4* 1.3* 1.5* 1.2*   No results for input(s): LIPASE, AMYLASE in the last 168 hours. No results for input(s): AMMONIA in the last 168 hours.  ABG    Component Value Date/Time   PHART 7.246 (L) 04/12/2019 0744   PCO2ART 32.5 04/12/2019 0744   PO2ART 64.7 (L) 04/12/2019 0744   HCO3 14.0 (L) 04/12/2019 0744   TCO2 28 10/21/2009 2057   ACIDBASEDEF 12.2 (H) 04/12/2019 0744   O2SAT 90.2 04/12/2019 0744     Coagulation Profile: Recent Labs  Lab 04/10/19 1645  INR 1.2    Cardiac Enzymes: No results for input(s): CKTOTAL, CKMB, CKMBINDEX, TROPONINI in the last 168 hours.  HbA1C: HbA1c, POC (controlled diabetic range)    Date/Time Value Ref Range Status  11/22/2018 11:52 AM 5.9 0.0 - 7.0 % Final  04/04/2018 04:10 PM 6.0 0.0 - 7.0 % Final   Hgb A1c MFr Bld  Date/Time Value Ref Range Status  04/08/2019 04:22 AM 7.9 (H) 4.8 - 5.6 % Final    Comment:    (NOTE) Pre diabetes:          5.7%-6.4% Diabetes:              >6.4% Glycemic control for   <7.0% adults with diabetes     CBG: Recent Labs  Lab 04/11/19 0744 04/11/19 1157 04/11/19 1612 04/11/19 2001 04/12/19 0724  GLUCAP 166* 158* 207* 202* 232*    Review of Systems:   Unable to obtain due to altered mental status  Past Medical History  She,  has a past medical history of Acute hypoxemic respiratory failure due to COVID-19 ([place]) (04/11/2019), Anemia of renal disease (06/21/2017), Atrial fibrillation ([place]) (04/11/2019), Chronic kidney disease (CKD), stage IV (severe) ([place]) (07/08/2017), CKD (chronic kidney disease), Diabetes mellitus without complication ([place]), Diabetic nephropathy ([place]) (04/11/2019), Emphysema lung ([place]) (04/11/2019), Fall (11/22/2018), Focal segmental glomerulosclerosis with chronic glomerulonephritis (04/11/2019), Frequent falls (07/22/2017), Grief (09/08/2017), Hematuria (01/20/2017), Hemodialysis status ([place]) (04/11/2019), Hyperlipidemia associated with type 2 diabetes mellitus ([place]) (10/26/2013), Hypertension, Nephrotic syndrome, Neuropathy, Pancreatitis (10/08/2016), Pneumonia due to COVID-19 virus (04/11/2019), Postherpetic neuralgia (09/08/2017), Primary hypertension (02/03/2017), Restless leg syndrome (03/03/2010), Solitary pulmonary nodule (10/10/2017), Symptomatic anemia (04/05/2018), Tobacco use disorder (06/16/2006), Tremor, and Uncontrolled type 2 diabetes mellitus with peripheral neuropathy ([place]) (06/16/2006).   Surgical History    Past Surgical History:  Procedure Laterality Date  . BACK SURGERY    . CESAREAN SECTION    . FOOT SURGERY    . IR FLUORO GUIDE CV LINE RIGHT  04/10/2019  . IR US GUIDE VASC ACCESS RIGHT  04/10/2019   . SHOULDER SURGERY       Social History   reports that she has been smoking cigarettes. She started smoking about 42 years ago. She has a 2.00 pack-year smoking history. She has never used smokeless tobacco. She reports that she does not drink alcohol or use drugs.   Family History   Her family history includes Cancer in her sister and another family member; Diabetes in her mother; Heart attack in her sister; Heart failure in her mother; Hypertension in her mother; Kidney failure in her father, maternal grandfather, maternal grandmother, mother, sister, and sister; Stroke in her brother; Thyroid disease in her mother.  Allergies Allergies  Allergen Reactions  . Atorvastatin Other (See Comments)    Suicidal thoughts  . Statins Other (See Comments)    This class of meds causes "suicidal thoughts"  . Amitriptyline Other (See Comments)    Depression  . Metformin And Related Diarrhea  . Adhesive [Tape] Other (See Comments)    "Sometimes peels off my skin"  . Gabapentin Other (See Comments)    "Gives me the shakes"  . Latex Rash     Home Medications  Prior to Admission medications   Medication Sig Start Date End Date Taking? Authorizing Provider  acetaminophen (TYLENOL) 500 MG tablet Take 1,000 mg by mouth every 6 (six) hours as needed (for pain or headaches).    Yes [provider]  amLODipine (NORVASC) 2.5 MG tablet Take 2.5 mg by mouth at bedtime as needed (for high B/P).  03/23/19  Yes [provider]  ammonium lactate (AMLACTIN) 12 % lotion Apply 1 application topically daily. Apply to both feet daily Patient taking differently: Apply 1 application topically See admin instructions. Apply to both feet daily 06/09/18  Yes [place], Stephani Police, DPM  capsaicin (ZOSTRIX) 0.025 % cream Apply topically 2 (two) times daily as needed (pain). 04/13/18  Yes Diallo, Abdoulaye, MD  Dulaglutide 1.5 MG/0.5ML SOPN Inject 1.5 mg into the skin once a week. Patient taking  differently: Inject 1.5 mg into the skin every Tuesday.  11/02/17  Yes Hensel, Jamal Collin, MD  FLUoxetine (PROZAC) 20 MG capsule Take 1 capsule (20 mg total) by mouth daily. 05/16/18  Yes [place] Bing, DO  ondansetron (ZOFRAN) 4 MG tablet Take 1 tablet (4 mg total) by mouth daily as needed for nausea or vomiting. 11/24/18 11/24/19 Yes Stark Klein, MD  tacrolimus (PROGRAF) 1 MG capsule Take 1-2 mg by mouth See admin instructions. Take 2 mg by mouth in the morning and 1 mg at bedtime 05/10/18  Yes [provider]  Blood Glucose Monitoring Suppl (ACCU-CHEK AVIVA PLUS) w/Device KIT 1 each by Does not apply route 3 (three) times daily with meals. 01/03/19   Guadalupe Dawn, MD  Blood Pressure Monitoring (BLOOD PRESSURE CUFF) MISC 1 each by Does not apply route as needed. 01/03/19   Guadalupe Dawn, MD  furosemide (LASIX) 40 MG tablet Take 80 mg by mouth 2 (two) times daily. FOR 5 DAYS 02/23/19   [provider]  furosemide (LASIX) 80 MG tablet Take 1 tablet (80 mg total) by mouth 2 (two) times daily. Patient not taking: Reported on 04/08/2019 11/24/18   Wilber Oliphant, MD  hydrALAZINE (APRESOLINE) 25 MG tablet Take 1 tablet (25 mg total) by mouth 3 (three) times daily for 30 days. Patient not taking: Reported on 04/08/2019 06/17/18 04/08/19  Shelly Coss, MD  Misc. Devices (WALKER) MISC 1 each by Does not apply route as needed. 01/03/19   Guadalupe Dawn, MD       The patient is critically ill with acute hypoxic respiratory failure and encephalopathy. She requires ICU for high complexity decision making, titration of high alert medications, ventilator management, titration of oxygen and interpretation of advanced monitoring.    I personally spent 45 minutes providing critical care services including personally reviewing test results, discussing care with nursing staff/other physicians and completing orders pertaining to this patient.  Time was exclusive to the patient and does not include  time spent teaching or in procedures.  Voice recognition software was used in the production of this record.  Errors in interpretation may have been inadvertently  missed during review.  Francine Graven, MSN, AGACNP  [facility]

## 2019-04-12 NOTE — Progress Notes (Signed)
Patient heart rate now 45. Elink notified, amiodarone paused and EKG done. Patient condition unchanged at this time. Responsive to pain, protecting airway, blood pressure stable.

## 2019-04-12 NOTE — Progress Notes (Signed)
Late entry for 0800 During report patient only responding to painful stimuli.  Rapid response in room. Patient's temperature 95 bair hugger applied.  Dr. Curly Rim and came to assess patient. Patient began to wake up more when cleaning her up.  Moving her right arm up and down and trying to pull her O2 off.  Patient eventually transferred to [place], report called.

## 2019-04-12 NOTE — Progress Notes (Signed)
FPTS Interim Progress Note  Spoke with Nephrology regarding patient's fluid status.  Agree that patient likely not fluid overloaded, therefore can give small dose of fluids if needed for hypotension, but would hold off now with stable Bps.    Will broaden antibiotics to vanc and cefepime.    Will start high dose protonix IV.  Hep is off in case of bleed.  Getting stat CT head.  HR goal <120, will decrease metoprolol to 2.5mg  QID.  Will attempt to contact family and consult palliative, as patient likely with poor prognosis at present.  [place], DO 04/12/2019, 10:06 AM PGY-2, [place] Service pager [phone]

## 2019-04-12 NOTE — Progress Notes (Addendum)
Patient's 4 am V?S generated a MEWS of 4, d/t elevated HR and depressed RR.  Nurse attempted to get recheck of V/S including temp.  Nurse unable to get either oral temp or axillary temp.  Nurse able to get rectal temp of 95.2 w/ assistance of another staff member. Another MEWS of 4 generated. Patient's covering Doctor noticed decreased temp and called about patient.  Heated blankets placed on patient and obtaining [employer].  Rapid Response nurse notified.    Rapid response nurse responded to bedside.  Bear hugger placed on patient.

## 2019-04-12 NOTE — Progress Notes (Signed)
[facility] NEPHROLOGY PROGRESS NOTE  Assessment/ Plan: Pt is a 58 y.o. yo female  with history of poorly controlled DM, HTN, HLD, nephrotic syndrome with biopsy-proven FSGS and diabetic nephropathy s/p prograf and rituximab, CKD stage IV with creatinine level somewhere between 3-4 at baseline, presented with worsening edema, Covid 19+, seen as a consultation for AKI on CKD.  #AKI on CKD stage IV with fluid overload: She has biopsy-proven FSGS and diabetic changes and follows at [facility]-  Has not responded to any treatment and had progressive dz.  She had received Prograf and subsequently 2 doses of rituximab.  She now has UTI and COVID-19 positive. Kidney US: No hydronephrosis.   Renal parameters  worsening including hyperkalemia, acidosis and uremia. S/p  placement of tunneled catheter and first HD early 12/23.  Attempting for second treatment for today.  I am certain that she is going to be long term HD requiring- starting CLIP.  Acute change today - possible CVA and looking like some sepsis parameters with hypothermia and hypotension-  If BP is more stable later will try HD without volume removal but if more unstable may need CRRT    #Fluid overload/acute pulmonary edema: Refractory to diuretics therefore restarting dialysis.  Did make urine-  Will continue lasix for now- sats holding on high flow o2-  Difficult to know if hypoxia due to COVID or volume  - is still making some urine  #Metabolic acidosis:  Starting renal replacement therapy as discussed above- slightly better. Cont bicarb for now until get another treatment under her belt   #Hyperkalemia: better  #COVID-19 positive: Receiving remdesivir and Decadron by primary team.  #UTI: On ceftriaxone.  Follow-up culture result.  Anemia-  iron stores low , no iron yet-   and giving ESA   Bones-  High phos-  Not biggest issue right now, will dec with HD when we can get   Subjective: Some dec MS this AM-  HCT indicates  possible right cerebellar acute infarct, also uremic.  There was a BP that was low and also with hypothermia at the time they were to get her for HD so she was passed - BP has been OK since  Objective Vital signs in last 24 hours: Vitals:   04/12/19 0830 04/12/19 0900 04/12/19 1000 04/12/19 1030  BP: (!) 89/67 100/64 106/67 99/75  Pulse: 89 91 86 92  Resp: 10 14 11 16   Temp:    (!) 95.7 F (35.4 C)  TempSrc:      SpO2: 99% 96% 100% 96%  Weight:      Height:       Weight change:   Intake/Output Summary (Last 24 hours) at 04/12/2019 1138 Last data filed at 04/12/2019 0715 Gross per 24 hour  Intake 343.33 ml  Output 325 ml  Net 18.33 ml       Labs: Basic Metabolic Panel: Recent Labs  Lab 04/10/19 0406 04/11/19 0451 04/12/19 0710  NA 140 142 141  K 5.6* 4.5 5.0  CL 113* 107 106  CO2 10* 15* 15*  GLUCOSE 221* 172* 275*  BUN 93* 70* 95*  CREATININE 8.33* 5.74* 6.90*  CALCIUM 7.4* 7.4* 7.3*  PHOS  --  8.1* 11.2*   Liver Function Tests: Recent Labs  Lab 04/10/19 0406 04/11/19 0451 04/12/19 0710  AST 74* 93* 66*  ALT 19 20 17   ALKPHOS 42 59 64  BILITOT 0.9 0.7 0.4  PROT 5.0* 5.2* 5.0*  ALBUMIN 1.3* 1.5* 1.2*   No  results for input(s): LIPASE, AMYLASE in the last 168 hours. No results for input(s): AMMONIA in the last 168 hours. CBC: Recent Labs  Lab 04/12/2019 2359 04/08/19 1254 04/10/19 2359 04/11/19 0451 04/11/19 1533 04/12/19 0710  WBC 11.4* 10.3 16.8* 18.1*  --  16.0*  NEUTROABS 9.8*  --   --   --   --   --   HGB 9.4* 7.9* 7.9* 8.5* 8.6* 7.9*  HCT 30.7* 25.3* 24.2* 25.1* 25.9* 24.3*  MCV 87.2 86.9 82.9 79.2*  --  80.2  PLT 177 160 182 162  --  170   Cardiac Enzymes: No results for input(s): CKTOTAL, CKMB, CKMBINDEX, TROPONINI in the last 168 hours. CBG: Recent Labs  Lab 04/11/19 0744 04/11/19 1157 04/11/19 1612 04/11/19 2001 04/12/19 0724  GLUCAP 166* 158* 207* 202* 232*    Iron Studies:  Recent Labs    04/12/19 0710  IRON 74   TIBC NOT CALCULATED  FERRITIN 7,097*   Studies/Results: CT HEAD WO CONTRAST  Result Date: 04/12/2019 CLINICAL DATA:  Altered mental status. EXAM: CT HEAD WITHOUT CONTRAST TECHNIQUE: Contiguous axial images were obtained from the base of the skull through the vertex without intravenous contrast. COMPARISON:  07/22/2017 FINDINGS: Brain: Chronic appearing bilateral basal ganglia lacunar infarcts. Low-density noted in the right cerebellar hemisphere compatible with acute to subacute infarction. No hemorrhage or hydrocephalus. Vascular: No hyperdense vessel or unexpected calcification. Skull: No acute calvarial abnormality. Sinuses/Orbits: Visualized paranasal sinuses and mastoids clear. Orbital soft tissues unremarkable. Other: None IMPRESSION: Areas of low-density in the right cerebellar hemisphere concerning for acute to subacute infarcts. Chronic appearing bilateral basal ganglia lacunar infarcts. Electronically Signed   By: Rolm Baptise M.D.   On: 04/12/2019 10:41   IR Fluoro Guide CV Line Right  Result Date: 04/11/2019 CLINICAL DATA:  Renal failure, needs access for hemodialysis. COVID positive EXAM: TUNNELED HEMODIALYSIS CATHETER PLACEMENT WITH ULTRASOUND AND FLUOROSCOPIC GUIDANCE TECHNIQUE: The procedure, risks, benefits, and alternatives were explained to the patient and family. Questions regarding the procedure were encouraged and answered. The family understands and consents to the procedure. As antibiotic prophylaxis, cefazolin 1 g was ordered pre-procedure and administered intravenously within one hour of incision.Patency of the right IJ vein was confirmed with ultrasound with image documentation. An appropriate skin site was determined. Region was prepped using maximum barrier technique including cap and mask, sterile gown, sterile gloves, large sterile sheet, and Chlorhexidine as cutaneous antisepsis. The region was infiltrated locally with 1% lidocaine. Intravenous Fentanyl 52mcg and Versed  1mg  were administered as conscious sedation during continuous monitoring of the patient's level of consciousness and physiological / cardiorespiratory status by the radiology RN, with a total moderate sedation time of 10 minutes. Under real-time ultrasound guidance, the right IJ vein was accessed with a 21 gauge micropuncture needle; the needle tip within the vein was confirmed with ultrasound image documentation. Needle exchanged over the 018 guidewire for transitional dilator, which allowed advancement of a Benson wire into the IVC. Over this, an MPA catheter was advanced. A Palindrome 23 hemodialysis catheter was tunneled from the right anterior chest wall approach to the right IJ dermatotomy site. The MPA catheter was exchanged over an Amplatz wire for serial vascular dilators which allow placement of a peel-away sheath, through which the catheter was advanced under intermittent fluoroscopy, positioned with its tips in the proximal and midright atrium. Spot chest radiograph confirms good catheter position. No pneumothorax. Catheter was flushed and primed per protocol. Catheter secured externally with O Prolene sutures. The right  IJ dermatotomy site was closed with Dermabond. COMPLICATIONS: COMPLICATIONS None immediate FLUOROSCOPY TIME:  0.9 minutes; 25  uGym2 DAP COMPARISON:  None IMPRESSION: 1. Technically successful placement of tunneled right IJ hemodialysis catheter with ultrasound and fluoroscopic guidance. Ready for routine use. ACCESS: Remains approachable for percutaneous intervention as needed. Electronically Signed   By: Lucrezia Europe M.D.   On: 04/11/2019 07:44   IR US Guide Vasc Access Right  Result Date: 04/11/2019 CLINICAL DATA:  Renal failure, needs access for hemodialysis. COVID positive EXAM: TUNNELED HEMODIALYSIS CATHETER PLACEMENT WITH ULTRASOUND AND FLUOROSCOPIC GUIDANCE TECHNIQUE: The procedure, risks, benefits, and alternatives were explained to the patient and family. Questions regarding  the procedure were encouraged and answered. The family understands and consents to the procedure. As antibiotic prophylaxis, cefazolin 1 g was ordered pre-procedure and administered intravenously within one hour of incision.Patency of the right IJ vein was confirmed with ultrasound with image documentation. An appropriate skin site was determined. Region was prepped using maximum barrier technique including cap and mask, sterile gown, sterile gloves, large sterile sheet, and Chlorhexidine as cutaneous antisepsis. The region was infiltrated locally with 1% lidocaine. Intravenous Fentanyl 66mcg and Versed 1mg  were administered as conscious sedation during continuous monitoring of the patient's level of consciousness and physiological / cardiorespiratory status by the radiology RN, with a total moderate sedation time of 10 minutes. Under real-time ultrasound guidance, the right IJ vein was accessed with a 21 gauge micropuncture needle; the needle tip within the vein was confirmed with ultrasound image documentation. Needle exchanged over the 018 guidewire for transitional dilator, which allowed advancement of a Benson wire into the IVC. Over this, an MPA catheter was advanced. A Palindrome 23 hemodialysis catheter was tunneled from the right anterior chest wall approach to the right IJ dermatotomy site. The MPA catheter was exchanged over an Amplatz wire for serial vascular dilators which allow placement of a peel-away sheath, through which the catheter was advanced under intermittent fluoroscopy, positioned with its tips in the proximal and midright atrium. Spot chest radiograph confirms good catheter position. No pneumothorax. Catheter was flushed and primed per protocol. Catheter secured externally with O Prolene sutures. The right IJ dermatotomy site was closed with Dermabond. COMPLICATIONS: COMPLICATIONS None immediate FLUOROSCOPY TIME:  0.9 minutes; 25  uGym2 DAP COMPARISON:  None IMPRESSION: 1. Technically  successful placement of tunneled right IJ hemodialysis catheter with ultrasound and fluoroscopic guidance. Ready for routine use. ACCESS: Remains approachable for percutaneous intervention as needed. Electronically Signed   By: Lucrezia Europe M.D.   On: 04/11/2019 07:44   DG CHEST PORT 1 VIEW  Result Date: 04/12/2019 CLINICAL DATA:  Sepsis EXAM: PORTABLE CHEST 1 VIEW COMPARISON:  04/10/2011 FINDINGS: Diffuse airspace disease throughout the lungs. No significant change since prior study. No effusions. Heart is normal size. Interval placement of right dialysis catheter with the tip in the lower right atrium. No pneumothorax. IMPRESSION: Interval placement of right dialysis catheter. The tip is in the lower right atrium. No pneumothorax. Severe diffuse bilateral airspace disease, unchanged. Electronically Signed   By: Rolm Baptise M.D.   On: 04/12/2019 10:11   DG Chest Port 1 View  Result Date: 04/10/2019 CLINICAL DATA:  COVID-19 pneumonia, hypoxia EXAM: PORTABLE CHEST 1 VIEW COMPARISON:  04/08/2019 chest radiograph. FINDINGS: Stable cardiomediastinal silhouette with normal heart size. No pneumothorax. No pleural effusion. Severe patchy consolidation throughout both lungs, substantially worsened. IMPRESSION: Substantial worsening of severe patchy consolidation throughout both lungs compatible with COVID-19 pneumonia. Electronically Signed   By: Corene Cornea  A Poff M.D.   On: 04/10/2019 14:32   ECHOCARDIOGRAM COMPLETE  Result Date: 04/11/2019   ECHOCARDIOGRAM REPORT   Patient Name:   [facility] Date of Exam: 04/11/2019 Medical Rec #:  [id number]        Height:       64.0 in Accession #:    [id number]       Weight:       137.8 lb Date of Birth:  January 28, 1961        BSA:          1.67 m Patient Age:    38 years         BP:           146/74 mmHg Patient Gender: F                HR:           106 bpm. Exam Location:  Inpatient Procedure: 2D Echo, Cardiac Doppler and Color Doppler Indications:    Atrial Fibrillation  427.31  History:        Patient has prior history of Echocardiogram examinations, most                 recent 04/06/2018. Risk Factors:Hypertension, Diabetes,                 Dyslipidemia and Current Smoker. Sepsis.  Sonographer:    Paulita Fujita RDCS Referring Phys: [place]  1. Left ventricular ejection fraction, by visual estimation, is 50 to 55%. The left ventricle has normal function. There is no left ventricular hypertrophy.  2. Left ventricular diastolic function could not be evaluated.  3. The left ventricle has no regional wall motion abnormalities.  4. Global right ventricle has normal systolic function.The right ventricular size is normal.  5. Left atrial size was normal.  6. Right atrial size was normal.  7. The mitral valve is normal in structure. Trivial mitral valve regurgitation. No evidence of mitral stenosis.  8. The tricuspid valve is normal in structure.  9. The aortic valve is tricuspid. Aortic valve regurgitation is not visualized. Mild aortic valve sclerosis without stenosis. 10. The pulmonic valve was normal in structure. Pulmonic valve regurgitation is not visualized. 11. The inferior vena cava is normal in size with greater than 50% respiratory variability, suggesting right atrial pressure of 3 mmHg. 12. HR 130-144 at time of study; normal LV function; trace MR and TR; catheter in right atrium. FINDINGS  Left Ventricle: Left ventricular ejection fraction, by visual estimation, is 50 to 55%. The left ventricle has normal function. The left ventricle has no regional wall motion abnormalities. There is no left ventricular hypertrophy. The left ventricular diastology could not be evaluated due to atrial fibrillation. Left ventricular diastolic function could not be evaluated. Normal left atrial pressure. Right Ventricle: The right ventricular size is normal.Global RV systolic function is has normal systolic function. The tricuspid regurgitant velocity is 2.12 m/s, and with  an assumed right atrial pressure of 8 mmHg, the estimated right ventricular systolic pressure is normal at 26.0 mmHg. Left Atrium: Left atrial size was normal in size. Right Atrium: Right atrial size was normal in size Pericardium: There is no evidence of pericardial effusion. Mitral Valve: The mitral valve is normal in structure. Trivial mitral valve regurgitation. No evidence of mitral valve stenosis by observation. Tricuspid Valve: The tricuspid valve is normal in structure. Tricuspid valve regurgitation is trivial. Aortic Valve: The aortic valve is tricuspid. Aortic  valve regurgitation is not visualized. Mild aortic valve sclerosis is present, with no evidence of aortic valve stenosis. Pulmonic Valve: The pulmonic valve was normal in structure. Pulmonic valve regurgitation is not visualized. Pulmonic regurgitation is not visualized. Aorta: The aortic root is normal in size and structure. Venous: The inferior vena cava is normal in size with greater than 50% respiratory variability, suggesting right atrial pressure of 3 mmHg.  Additional Comments: HR 130-144 at time of study; normal LV function; trace MR and TR; catheter in right atrium.  LEFT VENTRICLE PLAX 2D LVIDd:         3.94 cm LVIDs:         3.39 cm LV PW:         0.77 cm LV IVS:        0.74 cm LV SV:         20 ml LV SV Index:   12.11  LV Volumes (MOD) LV area d, A2C:    30.70 cm LV area d, A4C:    23.60 cm LV area s, A2C:    19.90 cm LV area s, A4C:    18.60 cm LV major d, A2C:   7.87 cm LV major d, A4C:   6.59 cm LV major s, A2C:   6.25 cm LV major s, A4C:   6.15 cm LV vol d, MOD A2C: 101.0 ml LV vol d, MOD A4C: 70.8 ml LV vol s, MOD A2C: 52.3 ml LV vol s, MOD A4C: 47.3 ml LV SV MOD A2C:     48.7 ml LV SV MOD A4C:     70.8 ml LV SV MOD BP:      42.3 ml RIGHT VENTRICLE TAPSE (M-mode): 1.6 cm LEFT ATRIUM             Index       RIGHT ATRIUM           Index LA diam:        2.90 cm 1.74 cm/m  RA Area:     13.70 cm LA Vol (A2C):   42.3 ml 25.33 ml/m  RA Volume:   39.50 ml  23.65 ml/m LA Vol (A4C):   34.1 ml 20.39 ml/m LA Biplane Vol: 41.7 ml 24.97 ml/m  AORTIC VALVE LVOT Vmax:   76.70 cm/s LVOT Vmean:  52.800 cm/s LVOT VTI:    0.140 m  AORTA Ao Root diam: 2.40 cm TRICUSPID VALVE TR Peak grad:   18.0 mmHg TR Vmax:        212.00 cm/s  SHUNTS Systemic VTI: 0.14 m  Kirk Ruths MD Electronically signed by Kirk Ruths MD Signature Date/Time: 04/11/2019/2:40:42 PM    Final     Medications: Infusions: . sodium chloride    . sodium chloride    . ceFEPime (MAXIPIME) IV    . remdesivir 100 mg in NS 100 mL Stopped (04/11/19 1714)  . vancomycin      Scheduled Medications: . aspirin  81 mg Oral Daily  . Chlorhexidine Gluconate Cloth  6 each Topical Q0600  . Chlorhexidine Gluconate Cloth  6 each Topical Q0600  . Darbepoetin Alfa      . darbepoetin (ARANESP) injection - DIALYSIS  100 mcg Intravenous Q Thu-HD  . dexamethasone (DECADRON) injection  6 mg Intravenous Q24H  . FLUoxetine  20 mg Oral Daily  . heparin      . heparin  3.8 mL Intracatheter Once  . insulin aspart  0-9 Units Subcutaneous TID WC  . insulin glargine  10 Units Subcutaneous QHS  . Ipratropium-Albuterol  1 puff Inhalation QID  . metoprolol tartrate  2.5 mg Intravenous Q6H  . pantoprazole (PROTONIX) IV  40 mg Intravenous Q12H  . sodium bicarbonate  1,300 mg Oral TID  . sodium chloride flush  3 mL Intravenous Q12H    have reviewed scheduled and prn medications.  Physical Exam: Patient was not examined directly because of COVID-19 pneumonia in order to lower exposure and to preserve PPE.  I have discussed with the patient, her nurse and primary team and reviewed the pertinent physical examination.    Maliha Outten A Megen Madewell 04/12/2019,11:38 AM  LOS: 4 days

## 2019-04-12 NOTE — Progress Notes (Signed)
Family Medicine Teaching Service Daily Progress Note Intern Pager: [phone]  Patient name: Andrea Glenn Medical record number: [id number] Date of birth: [birth year] Age: 58 y.o. Gender: female  Primary Care Provider: Guadalupe Dawn, MD Consultants: Nephrololgy, wound care Code Status: FULL  Pt Overview and Major Events to Date:  12/20 Admitted to [facility] 12/22- on HFNC   Assessment and Plan:  Andrea Glenn is a 58 y.o. female presenting with weakness now with sepsis due to [place]. PMH is significant for focal segmental glomerulonephritis, CKD, DM, HTN, venous stasis, restless leg syndrome.  Sepsis 2/2 COVID Pneumonia, now with possible super-imposed pneumonia vs urosepsis See intern progress note from today.  Patient on 15 L HFNC overnight, this morning after rapid response was called due to hypothermia to 95.2 and mews score of 4, patient was found to be without oxygen on.  Monitor was reviewed, patient has not had good waveform for 2 hours prior to this, unknown how long she was without her oxygen.  She had new change in mental status this a.m., responding only to pain.  She is breathing comfortably on 15 L with appropriate oxygen saturations in the low 90s.  Ordering stat portable chest x-ray to assess for possible superimposed bacterial pneumonia versus worsening of Covid pneumonia.  CRP worsening this a.m. 20>24.  D-dimer improving 7.83>5.5.  WBC has improved from 18>16 today.  ABG with metabolic acidosis [id number], anion gap 20 on CMP.  Concern for sepsis given this, but will hold off on fluids at this time given she would likely benefit from HD after receiving stat CT head.  We will continue to reassess and consider fluids if needed, MAPs now appropriate, last BP 101/69 in chart.  Could also consider prolonged hypoxia as a cause.   -Continue dexamethasone day 5 of 10 and remdesivir day 5 of 5, pending CXR can continue for 5 more days -Assess for need to escalate abx -repeat  CXR -lactic acid -blood cultures x2 -urine cx -hold off on fluids given stable MAPs at   Altered mental status Patient found to be altered this a.m., somnolent, arouses only to painful stimuli.  Does not speak, only moans.  This is new for her, as yesterday she was not speaking much, but was awake and alert.  Differential per above, patient also on heparin drip that was started yesterday for new's onset A. fib with RVR.  It is possible that she has a new intracranial bleed given being on heparin drip.  Therefore will turn off heparin drip and received stat CT head.  Could also be secondary to hypoperfusion, which could be attributed to not having oxygen for an uncertain amount of time or due to septic shock.  She is being worked up per above.  We will continue to monitor her mental status and overall medical status closely.  Also consider that patient has COVID, which can cause AMS. - plan per above - stat CT head - hold hep gtt until CT head results  New ESRD on HD, Hx of FSGN  UTI Patient received last dose of ceftriaxone on 12/23 around 7 AM.  Was planning to change patient to Keflex today, but given her overall decline, will continue with ceftriaxone.  Also obtain new urine culture as well blood culture, rest of plan per above given possible new sepsis.  Currently her maps are stable, I was concerned that they were slightly soft prior to this.  Patient was being set up for HD this a.m., but asked  to hold off while patient has this work-up for her decline in mental status and hypothermia.  BUN has increased to 95, therefore after this work-up is completed she would likely benefit from HD.  Her maps not seem to be able to tolerate this. -Nephrology consulted, now on HD -Strict I's and O's - cont CTX -Lasix 120 mg every 8 hours per nephro  New Onset A Fib Seen on telemetry on 12/23.  Patient was placed on metoprolol every 6 hours by cardiology and was placed on a heparin drip for  anticoagulation.  Patient now with high mental status this a.m., concern for head bleed given new heparin drip, therefore will hold heparin drip while patient has stat CT head to rule out head bleed.  Can restart if CT head is normal.  Heart rate on examination this a.m. in low 100s.  TSH within normal limits at 0.69, echo on 12/23 with EF 50 to [id number], no diastolic dysfunction, no noted thrombus. - f/u cards recs -Holding Heparin drip at present, working patient up for possible head bleed - add-on TSH  Elevated troponin: Improved Troponins 19990>1879>2156>1139>366.  Reassured that she has downtrending troponins.  Cardiology believed to be demand ischemia.  Echo on 12/23 with EF 50 to 55%.  Given new onset A. fib, patient has been placed on metoprolol every 4 hours. -Hold home Norvasc -Continue metoprolol per cardiology -Cardiology following appreciate recommendations  Lower extremity swelling with pain History and physical consistent with venous stasis.    Bilateral lower extremities with bandage wrapped. -Consider gabapentin for worsening pain control -Monitor lower extremity swelling, lasix as above - wound care consulted  AST elevation, downtrending AST 66, ALT 17.  Both consistently downtrending.  We will continue to monitor while patient is on remdesivir. -Trending LFTs with daily CMP  Diabetes, type II with LE neuropathy Last A1c 5.9 (11/22/18), home medication includes dulaglutide once weekly.  CBG is within normal limits overnight, only receiving sliding scale insulin.  CBG this a.m. 275, receiving dexamethasone. -SSI sensitive -Start Lantus 10 units nightly -Monitor CBGs  Anemia, normocytic  Hemoglobin 9.4 on admission decreased from 11.62 months ago.  Hemoglobin 8.5>7.9.  Ferritin on admission elevated to 2725, likely secondary to chronic kidney disease.  No known coronary artery disease, transfusion threshold of 7. -Continue to monitor on daily CBC -Transfuse when necessary,  threshold 7  HTN Patient has been more hypotensive, most recent blood pressure with maps in 70s.  See above.  Home medication included amlodipine 10 mg daily prior to this admission, holding now per cards recs and change to metop.   -cont Metoprolol QID for rate control, watch BP closely  MDD Currently taking fluoxetine. -Continue fluoxetine  Nicotine use disorder Current smoker.  No desire for nicotine patch at this time. -Order nicotine patch if desired  FEN/GI: renal diet Prophylaxis: heparin per pharm for COVID-19 infection and new-onset A fib  Disposition: Pending PT OT eval, patient lives home alone  Subjective:  Patient lying in bed, somnolent, not responding to questions, wincing to pain only.  Moaning at times.  Objective: Temp:  [95.2 F (35.1 C)-97.9 F (36.6 C)] 95.2 F (35.1 C) (12/24 0619) Pulse Rate:  [74-109] 75 (12/24 0815) Resp:  [11-21] 17 (12/24 0815) BP: (76-115)/(54-80) 101/69 (12/24 0815) SpO2:  [90 %-100 %] 100 % (12/24 0815)   Physical Exam:  General: 58 y.o. female in NAD Cardio: Irregularly irregular, rate low 100s in room Lungs: Diffuse crackles, decent air movement throughout lung fields, on  15 L per HFNC Abdomen: Soft, winces to palpation Extremities: Trace BLE edema, bilateral bandages on lower extremities Neuro: Moans to painful stimuli, does not follow commands, does not open eyes even when prompted   Laboratory: Recent Labs  Lab 04/10/19 2359 04/11/19 0451 04/11/19 1533 04/12/19 0710  WBC 16.8* 18.1*  --  16.0*  HGB 7.9* 8.5* 8.6* 7.9*  HCT 24.2* 25.1* 25.9* 24.3*  PLT 182 162  --  170   Recent Labs  Lab 04/10/19 0406 04/11/19 0451 04/12/19 0710  NA 140 142 141  K 5.6* 4.5 5.0  CL 113* 107 106  CO2 10* 15* 15*  BUN 93* 70* 95*  CREATININE 8.33* 5.74* 6.90*  CALCIUM 7.4* 7.4* 7.3*  PROT 5.0* 5.2* 5.0*  BILITOT 0.9 0.7 0.4  ALKPHOS 42 59 64  ALT 19 20 17   AST 74* 93* 66*  GLUCOSE 221* 172* 275*       Imaging/Diagnostic Tests: ECHOCARDIOGRAM COMPLETE  Result Date: 04/11/2019   ECHOCARDIOGRAM REPORT   Patient Name:   REBECCALYNN CAI Date of Exam: 04/11/2019 Medical Rec #:  [id number]        Height:       64.0 in Accession #:    [id number]       Weight:       137.8 lb Date of Birth:  Sep 06, 1960        BSA:          1.67 m Patient Age:    56 years         BP:           146/74 mmHg Patient Gender: F                HR:           106 bpm. Exam Location:  Inpatient Procedure: 2D Echo, Cardiac Doppler and Color Doppler Indications:    Atrial Fibrillation 427.31  History:        Patient has prior history of Echocardiogram examinations, most                 recent 04/06/2018. Risk Factors:Hypertension, Diabetes,                 Dyslipidemia and Current Smoker. Sepsis.  Sonographer:    Paulita Fujita RDCS Referring Phys: [place]  1. Left ventricular ejection fraction, by visual estimation, is 50 to 55%. The left ventricle has normal function. There is no left ventricular hypertrophy.  2. Left ventricular diastolic function could not be evaluated.  3. The left ventricle has no regional wall motion abnormalities.  4. Global right ventricle has normal systolic function.The right ventricular size is normal.  5. Left atrial size was normal.  6. Right atrial size was normal.  7. The mitral valve is normal in structure. Trivial mitral valve regurgitation. No evidence of mitral stenosis.  8. The tricuspid valve is normal in structure.  9. The aortic valve is tricuspid. Aortic valve regurgitation is not visualized. Mild aortic valve sclerosis without stenosis. 10. The pulmonic valve was normal in structure. Pulmonic valve regurgitation is not visualized. 11. The inferior vena cava is normal in size with greater than 50% respiratory variability, suggesting right atrial pressure of 3 mmHg. 12. HR 130-144 at time of study; normal LV function; trace MR and TR; catheter in right atrium. FINDINGS  Left  Ventricle: Left ventricular ejection fraction, by visual estimation, is 50 to 55%. The left ventricle has normal function. The  left ventricle has no regional wall motion abnormalities. There is no left ventricular hypertrophy. The left ventricular diastology could not be evaluated due to atrial fibrillation. Left ventricular diastolic function could not be evaluated. Normal left atrial pressure. Right Ventricle: The right ventricular size is normal.Global RV systolic function is has normal systolic function. The tricuspid regurgitant velocity is 2.12 m/s, and with an assumed right atrial pressure of 8 mmHg, the estimated right ventricular systolic pressure is normal at 26.0 mmHg. Left Atrium: Left atrial size was normal in size. Right Atrium: Right atrial size was normal in size Pericardium: There is no evidence of pericardial effusion. Mitral Valve: The mitral valve is normal in structure. Trivial mitral valve regurgitation. No evidence of mitral valve stenosis by observation. Tricuspid Valve: The tricuspid valve is normal in structure. Tricuspid valve regurgitation is trivial. Aortic Valve: The aortic valve is tricuspid. Aortic valve regurgitation is not visualized. Mild aortic valve sclerosis is present, with no evidence of aortic valve stenosis. Pulmonic Valve: The pulmonic valve was normal in structure. Pulmonic valve regurgitation is not visualized. Pulmonic regurgitation is not visualized. Aorta: The aortic root is normal in size and structure. Venous: The inferior vena cava is normal in size with greater than 50% respiratory variability, suggesting right atrial pressure of 3 mmHg.  Additional Comments: HR 130-144 at time of study; normal LV function; trace MR and TR; catheter in right atrium.  LEFT VENTRICLE PLAX 2D LVIDd:         3.94 cm LVIDs:         3.39 cm LV PW:         0.77 cm LV IVS:        0.74 cm LV SV:         20 ml LV SV Index:   12.11  LV Volumes (MOD) LV area d, A2C:    30.70 cm LV area d,  A4C:    23.60 cm LV area s, A2C:    19.90 cm LV area s, A4C:    18.60 cm LV major d, A2C:   7.87 cm LV major d, A4C:   6.59 cm LV major s, A2C:   6.25 cm LV major s, A4C:   6.15 cm LV vol d, MOD A2C: 101.0 ml LV vol d, MOD A4C: 70.8 ml LV vol s, MOD A2C: 52.3 ml LV vol s, MOD A4C: 47.3 ml LV SV MOD A2C:     48.7 ml LV SV MOD A4C:     70.8 ml LV SV MOD BP:      42.3 ml RIGHT VENTRICLE TAPSE (M-mode): 1.6 cm LEFT ATRIUM             Index       RIGHT ATRIUM           Index LA diam:        2.90 cm 1.74 cm/m  RA Area:     13.70 cm LA Vol (A2C):   42.3 ml 25.33 ml/m RA Volume:   39.50 ml  23.65 ml/m LA Vol (A4C):   34.1 ml 20.39 ml/m LA Biplane Vol: 41.7 ml 24.97 ml/m  AORTIC VALVE LVOT Vmax:   76.70 cm/s LVOT Vmean:  52.800 cm/s LVOT VTI:    0.140 m  AORTA Ao Root diam: 2.40 cm TRICUSPID VALVE TR Peak grad:   18.0 mmHg TR Vmax:        212.00 cm/s  SHUNTS Systemic VTI: 0.14 m  Kirk Ruths MD Electronically signed by Kirk Ruths  MD Signature Date/Time: 04/11/2019/2:40:42 PM    Final      Cleophas Dunker, DO 04/12/2019, 9:09 AM PGY-2, [facility] Intern pager: [phone], text pages welcome

## 2019-04-12 NOTE — Significant Event (Signed)
Rapid Response Event Note  Received a call from nurse that patient is moving to the ICU due to ongoing/worsening altered mental status, coupled with periods of hypotension. I went up to see the patient and assisted with the transport to ICU. Patient's mental status/neurologic exam essenitally was the same for me from this morning, perhaps a little better, she is now picking her gown and her telemetry but calms down when directed. Respiratory status unchanged, 94% on HFNC 15L - RR effort - overall stable. CTH + subacute/acute infarcts. HR remains AF low 100s, SBP upper 80s to 90s.   Patient transferred to 3M05. Tolerated transfer, remained stable during transport.   Start Time 1211 End Time 1316   Hiram Mciver R

## 2019-04-12 NOTE — Progress Notes (Signed)
Progress Note  Patient Name: Andrea Glenn Date of Encounter: 04/12/2019  Primary Cardiologist:   No primary care provider on file.   Subjective   Events noted with AMS.   Awake but not responding to questions.    Inpatient Medications    Scheduled Meds: . aspirin  81 mg Oral Daily  . Chlorhexidine Gluconate Cloth  6 each Topical Q0600  . Chlorhexidine Gluconate Cloth  6 each Topical Q0600  . Darbepoetin Alfa      . darbepoetin (ARANESP) injection - DIALYSIS  100 mcg Intravenous Q Thu-HD  . dexamethasone (DECADRON) injection  6 mg Intravenous Q24H  . FLUoxetine  20 mg Oral Daily  . heparin      . heparin  3.8 mL Intracatheter Once  . insulin aspart  0-9 Units Subcutaneous TID WC  . insulin glargine  10 Units Subcutaneous QHS  . Ipratropium-Albuterol  1 puff Inhalation QID  . metoprolol tartrate  5 mg Intravenous Q6H  . sodium bicarbonate  1,300 mg Oral TID  . sodium chloride flush  3 mL Intravenous Q12H   Continuous Infusions: . sodium chloride    . sodium chloride    . cefTRIAXone (ROCEPHIN)  IV    . furosemide 120 mg (04/12/19 0629)  . remdesivir 100 mg in NS 100 mL Stopped (04/11/19 1714)   PRN Meds: sodium chloride, sodium chloride, acetaminophen, alteplase, heparin, heparin, lidocaine (PF), lidocaine-prilocaine, ondansetron (ZOFRAN) IV, pentafluoroprop-tetrafluoroeth, polyethylene glycol, traMADol   Vital Signs    Vitals:   04/12/19 0755 04/12/19 0800 04/12/19 0805 04/12/19 0815  BP: 96/74 98/63 96/66  101/69  Pulse: 87 86 89 75  Resp: 17 18 19 17   Temp:      TempSrc:      SpO2: 98% 100% 98% 100%  Weight:      Height:        Intake/Output Summary (Last 24 hours) at 04/12/2019 1001 Last data filed at 04/12/2019 0715 Gross per 24 hour  Intake 343.33 ml  Output 325 ml  Net 18.33 ml   Filed Weights   04/08/19 0427 04/11/19 0009 04/11/19 0228  Weight: 64.4 kg 64.2 kg 62.5 kg    Telemetry    Atrial fib with RVR- Personally Reviewed  ECG      NA - Personally Reviewed  Physical Exam   GEN:    Acutely ill.   Neck: No  JVD Cardiac: Irregular RR, soft apical systolic murmur, no diastolic murmurs, rubs, or gallops.  Respiratory:   Decreased breath sounds with diffuse crackles GI: Soft, tender to palpation MS:   Mild diffuse edema; No deformity. Neuro:   moves extremities   Labs    Chemistry Recent Labs  Lab 04/10/19 0406 04/11/19 0451 04/12/19 0710  NA 140 142 141  K 5.6* 4.5 5.0  CL 113* 107 106  CO2 10* 15* 15*  GLUCOSE 221* 172* 275*  BUN 93* 70* 95*  CREATININE 8.33* 5.74* 6.90*  CALCIUM 7.4* 7.4* 7.3*  PROT 5.0* 5.2* 5.0*  ALBUMIN 1.3* 1.5* 1.2*  AST 74* 93* 66*  ALT 19 20 17   ALKPHOS 42 59 64  BILITOT 0.9 0.7 0.4  GFRNONAA 5* 7* 6*  GFRAA 6* 9* 7*  ANIONGAP 17* 20* 20*     Hematology Recent Labs  Lab 04/10/19 2359 04/11/19 0451 04/11/19 1533 04/12/19 0710  WBC 16.8* 18.1*  --  16.0*  RBC 2.92* 3.17*  --  3.03*  HGB 7.9* 8.5* 8.6* 7.9*  HCT 24.2* 25.1* 25.9* 24.3*  MCV 82.9 79.2*  --  80.2  MCH 27.1 26.8  --  26.1  MCHC 32.6 33.9  --  32.5  RDW 18.5* 17.9*  --  18.0*  PLT 182 162  --  170    Cardiac EnzymesNo results for input(s): TROPONINI in the last 168 hours. No results for input(s): TROPIPOC in the last 168 hours.   BNP Recent Labs  Lab 04/08/19 0422  BNP 1,163.8*     DDimer  Recent Labs  Lab 04/10/19 0406 04/11/19 0451 04/12/19 0710  DDIMER 7.92* 7.83* 5.50*     Radiology    IR Fluoro Guide CV Line Right  Result Date: 04/11/2019 CLINICAL DATA:  Renal failure, needs access for hemodialysis. COVID positive EXAM: TUNNELED HEMODIALYSIS CATHETER PLACEMENT WITH ULTRASOUND AND FLUOROSCOPIC GUIDANCE TECHNIQUE: The procedure, risks, benefits, and alternatives were explained to the patient and family. Questions regarding the procedure were encouraged and answered. The family understands and consents to the procedure. As antibiotic prophylaxis, cefazolin 1 g was ordered  pre-procedure and administered intravenously within one hour of incision.Patency of the right IJ vein was confirmed with ultrasound with image documentation. An appropriate skin site was determined. Region was prepped using maximum barrier technique including cap and mask, sterile gown, sterile gloves, large sterile sheet, and Chlorhexidine as cutaneous antisepsis. The region was infiltrated locally with 1% lidocaine. Intravenous Fentanyl 14mcg and Versed 1mg  were administered as conscious sedation during continuous monitoring of the patient's level of consciousness and physiological / cardiorespiratory status by the radiology RN, with a total moderate sedation time of 10 minutes. Under real-time ultrasound guidance, the right IJ vein was accessed with a 21 gauge micropuncture needle; the needle tip within the vein was confirmed with ultrasound image documentation. Needle exchanged over the 018 guidewire for transitional dilator, which allowed advancement of a Benson wire into the IVC. Over this, an MPA catheter was advanced. A Palindrome 23 hemodialysis catheter was tunneled from the right anterior chest wall approach to the right IJ dermatotomy site. The MPA catheter was exchanged over an Amplatz wire for serial vascular dilators which allow placement of a peel-away sheath, through which the catheter was advanced under intermittent fluoroscopy, positioned with its tips in the proximal and midright atrium. Spot chest radiograph confirms good catheter position. No pneumothorax. Catheter was flushed and primed per protocol. Catheter secured externally with O Prolene sutures. The right IJ dermatotomy site was closed with Dermabond. COMPLICATIONS: COMPLICATIONS None immediate FLUOROSCOPY TIME:  0.9 minutes; 25  uGym2 DAP COMPARISON:  None IMPRESSION: 1. Technically successful placement of tunneled right IJ hemodialysis catheter with ultrasound and fluoroscopic guidance. Ready for routine use. ACCESS: Remains  approachable for percutaneous intervention as needed. Electronically Signed   By: Lucrezia Europe M.D.   On: 04/11/2019 07:44   IR US Guide Vasc Access Right  Result Date: 04/11/2019 CLINICAL DATA:  Renal failure, needs access for hemodialysis. COVID positive EXAM: TUNNELED HEMODIALYSIS CATHETER PLACEMENT WITH ULTRASOUND AND FLUOROSCOPIC GUIDANCE TECHNIQUE: The procedure, risks, benefits, and alternatives were explained to the patient and family. Questions regarding the procedure were encouraged and answered. The family understands and consents to the procedure. As antibiotic prophylaxis, cefazolin 1 g was ordered pre-procedure and administered intravenously within one hour of incision.Patency of the right IJ vein was confirmed with ultrasound with image documentation. An appropriate skin site was determined. Region was prepped using maximum barrier technique including cap and mask, sterile gown, sterile gloves, large sterile sheet, and Chlorhexidine as cutaneous antisepsis. The region was infiltrated locally  with 1% lidocaine. Intravenous Fentanyl 51mcg and Versed 1mg  were administered as conscious sedation during continuous monitoring of the patient's level of consciousness and physiological / cardiorespiratory status by the radiology RN, with a total moderate sedation time of 10 minutes. Under real-time ultrasound guidance, the right IJ vein was accessed with a 21 gauge micropuncture needle; the needle tip within the vein was confirmed with ultrasound image documentation. Needle exchanged over the 018 guidewire for transitional dilator, which allowed advancement of a Benson wire into the IVC. Over this, an MPA catheter was advanced. A Palindrome 23 hemodialysis catheter was tunneled from the right anterior chest wall approach to the right IJ dermatotomy site. The MPA catheter was exchanged over an Amplatz wire for serial vascular dilators which allow placement of a peel-away sheath, through which the catheter was  advanced under intermittent fluoroscopy, positioned with its tips in the proximal and midright atrium. Spot chest radiograph confirms good catheter position. No pneumothorax. Catheter was flushed and primed per protocol. Catheter secured externally with O Prolene sutures. The right IJ dermatotomy site was closed with Dermabond. COMPLICATIONS: COMPLICATIONS None immediate FLUOROSCOPY TIME:  0.9 minutes; 25  uGym2 DAP COMPARISON:  None IMPRESSION: 1. Technically successful placement of tunneled right IJ hemodialysis catheter with ultrasound and fluoroscopic guidance. Ready for routine use. ACCESS: Remains approachable for percutaneous intervention as needed. Electronically Signed   By: Lucrezia Europe M.D.   On: 04/11/2019 07:44   DG Chest Port 1 View  Result Date: 04/10/2019 CLINICAL DATA:  COVID-19 pneumonia, hypoxia EXAM: PORTABLE CHEST 1 VIEW COMPARISON:  04/08/2019 chest radiograph. FINDINGS: Stable cardiomediastinal silhouette with normal heart size. No pneumothorax. No pleural effusion. Severe patchy consolidation throughout both lungs, substantially worsened. IMPRESSION: Substantial worsening of severe patchy consolidation throughout both lungs compatible with COVID-19 pneumonia. Electronically Signed   By: Ilona Sorrel M.D.   On: 04/10/2019 14:32   ECHOCARDIOGRAM COMPLETE  Result Date: 04/11/2019   ECHOCARDIOGRAM REPORT   Patient Name:   Andrea Glenn Date of Exam: 04/11/2019 Medical Rec #:  [id number]        Height:       64.0 in Accession #:    [id number]       Weight:       137.8 lb Date of Birth:  [birth year]        BSA:          1.67 m Patient Age:    21 years         BP:           146/74 mmHg Patient Gender: F                HR:           106 bpm. Exam Location:  Inpatient Procedure: 2D Echo, Cardiac Doppler and Color Doppler Indications:    Atrial Fibrillation 427.31  History:        Patient has prior history of Echocardiogram examinations, most                 recent 04/06/2018. Risk  Factors:Hypertension, Diabetes,                 Dyslipidemia and Current Smoker. Sepsis.  Sonographer:    Paulita Fujita RDCS Referring Phys: [place]  1. Left ventricular ejection fraction, by visual estimation, is 50 to 55%. The left ventricle has normal function. There is no left ventricular hypertrophy.  2. Left ventricular diastolic function could not be evaluated.  3. The left ventricle has no regional wall motion abnormalities.  4. Global right ventricle has normal systolic function.The right ventricular size is normal.  5. Left atrial size was normal.  6. Right atrial size was normal.  7. The mitral valve is normal in structure. Trivial mitral valve regurgitation. No evidence of mitral stenosis.  8. The tricuspid valve is normal in structure.  9. The aortic valve is tricuspid. Aortic valve regurgitation is not visualized. Mild aortic valve sclerosis without stenosis. 10. The pulmonic valve was normal in structure. Pulmonic valve regurgitation is not visualized. 11. The inferior vena cava is normal in size with greater than 50% respiratory variability, suggesting right atrial pressure of 3 mmHg. 12. HR 130-144 at time of study; normal LV function; trace MR and TR; catheter in right atrium. FINDINGS  Left Ventricle: Left ventricular ejection fraction, by visual estimation, is 50 to 55%. The left ventricle has normal function. The left ventricle has no regional wall motion abnormalities. There is no left ventricular hypertrophy. The left ventricular diastology could not be evaluated due to atrial fibrillation. Left ventricular diastolic function could not be evaluated. Normal left atrial pressure. Right Ventricle: The right ventricular size is normal.Global RV systolic function is has normal systolic function. The tricuspid regurgitant velocity is 2.12 m/s, and with an assumed right atrial pressure of 8 mmHg, the estimated right ventricular systolic pressure is normal at 26.0 mmHg. Left  Atrium: Left atrial size was normal in size. Right Atrium: Right atrial size was normal in size Pericardium: There is no evidence of pericardial effusion. Mitral Valve: The mitral valve is normal in structure. Trivial mitral valve regurgitation. No evidence of mitral valve stenosis by observation. Tricuspid Valve: The tricuspid valve is normal in structure. Tricuspid valve regurgitation is trivial. Aortic Valve: The aortic valve is tricuspid. Aortic valve regurgitation is not visualized. Mild aortic valve sclerosis is present, with no evidence of aortic valve stenosis. Pulmonic Valve: The pulmonic valve was normal in structure. Pulmonic valve regurgitation is not visualized. Pulmonic regurgitation is not visualized. Aorta: The aortic root is normal in size and structure. Venous: The inferior vena cava is normal in size with greater than 50% respiratory variability, suggesting right atrial pressure of 3 mmHg.  Additional Comments: HR 130-144 at time of study; normal LV function; trace MR and TR; catheter in right atrium.  LEFT VENTRICLE PLAX 2D LVIDd:         3.94 cm LVIDs:         3.39 cm LV PW:         0.77 cm LV IVS:        0.74 cm LV SV:         20 ml LV SV Index:   12.11  LV Volumes (MOD) LV area d, A2C:    30.70 cm LV area d, A4C:    23.60 cm LV area s, A2C:    19.90 cm LV area s, A4C:    18.60 cm LV major d, A2C:   7.87 cm LV major d, A4C:   6.59 cm LV major s, A2C:   6.25 cm LV major s, A4C:   6.15 cm LV vol d, MOD A2C: 101.0 ml LV vol d, MOD A4C: 70.8 ml LV vol s, MOD A2C: 52.3 ml LV vol s, MOD A4C: 47.3 ml LV SV MOD A2C:     48.7 ml LV SV MOD A4C:     70.8 ml LV SV MOD BP:      42.3  ml RIGHT VENTRICLE TAPSE (M-mode): 1.6 cm LEFT ATRIUM             Index       RIGHT ATRIUM           Index LA diam:        2.90 cm 1.74 cm/m  RA Area:     13.70 cm LA Vol (A2C):   42.3 ml 25.33 ml/m RA Volume:   39.50 ml  23.65 ml/m LA Vol (A4C):   34.1 ml 20.39 ml/m LA Biplane Vol: 41.7 ml 24.97 ml/m  AORTIC VALVE  LVOT Vmax:   76.70 cm/s LVOT Vmean:  52.800 cm/s LVOT VTI:    0.140 m  AORTA Ao Root diam: 2.40 cm TRICUSPID VALVE TR Peak grad:   18.0 mmHg TR Vmax:        212.00 cm/s  SHUNTS Systemic VTI: 0.14 m  Kirk Ruths MD Electronically signed by Kirk Ruths MD Signature Date/Time: 04/11/2019/2:40:42 PM    Final     Cardiac Studies   Echo   1. Left ventricular ejection fraction, by visual estimation, is 50 to 55%. The left ventricle has normal function. There is no left ventricular hypertrophy. 2. Left ventricular diastolic function could not be evaluated. 3. The left ventricle has no regional wall motion abnormalities. 4. Global right ventricle has normal systolic function.The right ventricular size is normal. 5. Left atrial size was normal. 6. Right atrial size was normal. 7. The mitral valve is normal in structure. Trivial mitral valve regurgitation. No evidence of mitral stenosis. 8. The tricuspid valve is normal in structure. 9. The aortic valve is tricuspid. Aortic valve regurgitation is not visualized. Mild aortic valve sclerosis without stenosis. 10. The pulmonic valve was normal in structure. Pulmonic valve regurgitation is not visualized. 11. The inferior vena cava is normal in size with greater than 50% respiratory variability, suggesting right atrial pressure of 3 mmHg. 12. HR 130-144 at time of study; normal LV function; trace MR and TR; catheter in right atrium.  Patient Profile     58 y.o. female  with a hx of HTN, HLD, poorly controled DM with neurophathy, FSGS with nephrotic syndrome, tobacco smoking and CKD IV who is being seen for the evaluation of elevated troponin at the request of Dr. Nori Riis.   She has COVID 19.    Assessment & Plan    ELEVATED TROPONIN:  Might be demand ischemia.   Trended down and EF OK.  No further work up for this.    ACUTE ON CHRONIC RENAL INSUFFICIENCY:  Was getting dialysis.   Given hemodynamics she will be on CVVH.    ACUTE RESPIRATORY  FAILURE:    Per primary team CCM.    ATRIAL FIB:  Heparin on hold pending neuro work up.  No acute bleed on head CT.  There is evidence of right cerebellar acute to subacute infarcts.  BP[ has fallen and I will discontinue beta blocker.  HR increased.  Rate will be difficult to control secondary to underlying sepsis.    IV amio started for rate control.  Resume heparin if acute stroke thought to be embolic/thrombotic.    For questions or updates, please contact [place] Please consult [email or web address] for contact info under Cardiology/STEMI.   Signed, Minus Breeding, MD  04/12/2019, 10:01 AM

## 2019-04-12 NOTE — Significant Event (Addendum)
Rapid Response Event Note  Overview: MEWS 4 - Hypothermia and Tachycardia  Initial Focused Assessment: I received a call from nurse to report a MEWS 4 for low temperature and increased HR. When I came to see the patient, her oxygen was not on, - the cannula was off and behind her head. No pulse oxygen reading was registering. I reapplied the oxygen and was not responding, I attempted to sternal rub several time and finally after a few minutes, she started to moan. Per nurse, patient is alert and oriented x 1, but currently is quite somnolent, her SBP was in the 70s when I cycled the first one. Lasix was given already and completed. 95% on HFNC.15L. Blood sugar > 200. Bair hugger was applied after I arrived. Heparin drip was infusing. Patient did become responsive towards the end of my assessment - would moan and moves BU extremities spontaneously and BLE to painful stimuli. Pupils hard to assess given that patient has decorative contact lenses in and I was not able to remove them.   Interventions: -- STAT ABG -- STAT LABs  Plan of Care: -- Patient to go for CT Head - was on Heparin Drip which I held for the time being -- F/U with scan, labs, and ABG.  -- Monitor VS -- recheck temperature and VS frequently  Event Summary:  Call Time 0700 Arrival Time 0704 End Time 0800  Andrea Glenn

## 2019-04-12 NOTE — Progress Notes (Addendum)
Have attempted to call Imogene Burn, only relative listed in chart.  Attempted 3x, each time there is one ring, then states that VM has not been set up.  Palliative has been consulted.  Will continue to try to contact.  Have attempted to contact again, same response as above.  [place] Constable, D.O.  PGY-2 Family Medicine  04/12/2019 10:21 AM

## 2019-04-12 NOTE — Progress Notes (Signed)
PT Cancellation Note  Patient Details Name: Andrea Glenn MRN: [id number] DOB: [birth year]   Cancelled Treatment:    Reason Eval/Treat Not Completed: (P) Medical issues which prohibited therapy MRI this morning revealed cerebellar infarct. Pt currently being considered for transfer to ICU. PT will follow back for evaluation when pt is medically ready.  Vanita Cannell B. Migdalia Dk PT, DPT Acute Rehabilitation Services Pager [phone] Office [phone]    [place] 04/12/2019, 11:46 AM

## 2019-04-12 NOTE — Progress Notes (Addendum)
FPTS Interim Progress Note  S: Paged that patient with hypothermia to 95.2 and soft Bps.  Patient seen and examined at bedside.  Rapid Response RN at bedside on arrival.  Please see her note for her assessment.  Patient lying in bed, Bair hugger on, moaning to painful stimuli, BP at bedside with MAPs in 70s, on HFNC 15L, sats in low 90s.  Per Rapid Response RN, when she came into room, patient did not have O2 on.  ABG, CMP had been drawn.  HD nurse had bedside set up ready, but patient was not yet started on it.  Discussed with RR RN differential for change in mental status, including lack of HFNC, need for HD, hypoperfusion, thoguh MAPs had been >60s, or possible intracranial bleed given patient has been on hep gtt since yesterday.  Hep gtt turned off while in room, order discontinued, hold until CT head.  Advised HD nurse to hold off on HD pending labs given soft pressures, CMP and ABG pending.  Spoke with day nurse, Ginger and updated on patient's condition.  Upon leaving room, patient was somnolent, but arouses to pain and moans, doesn't open eyes, breathing comfortably with appropriate sats on 15L per HFNC, MAP 84.  RN to accompany patient to stat CT head.  Will continue to monitor closely.  Update: ABG with metabolic acidosis, anion gap on CMP.  Could consider hyperlactatemia as cause, could be 2/2 hypoperfusion from lack of oxygen or shock.  BUN also elevated to 95, therfore uremia could be contributing to mental status.  Will order stat lactic acid, pressures now, 101/69.  After stat head CT, if pressures still stable, can likely start HD, as suspect would improve status, but need to rule out head bleed.  Given her previous hypothermia and overall decline, will also order blood cultures x2, urine culture, stat portable CXR, restart CTX for known UTI (last dose yesterday AM), and consider further broadening if needed.  Would hold off on fluids at this time given her MAPs are currently appropriate and she  is waiting for HD.  [place], DO 04/12/2019, 8:15 AM PGY-2, [place] Service pager [phone]

## 2019-04-12 NOTE — Progress Notes (Signed)
FPTS Interim Progress Note  Spoke with neurology given possible right cerebellar acute infarct, he agrees for consult and will order stat CT angio.  Nursing called and advised of this.  She requested CCM consult for higher level of care given patient's status.  Consulted CCM, spoke with Dr. Nelda Marseille and advised of status.  She has been protecting her airway and he was advised of this.  He notes that he will send someone from CCM team to see the patient.  [place], DO 04/12/2019, 11:07 AM PGY-2, [place] Service pager [phone]

## 2019-04-13 DIAGNOSIS — G9341 Metabolic encephalopathy: Secondary | ICD-10-CM

## 2019-04-13 DIAGNOSIS — N185 Chronic kidney disease, stage 5: Secondary | ICD-10-CM

## 2019-04-13 DIAGNOSIS — E1122 Type 2 diabetes mellitus with diabetic chronic kidney disease: Secondary | ICD-10-CM

## 2019-04-13 DIAGNOSIS — N269 Renal sclerosis, unspecified: Secondary | ICD-10-CM

## 2019-04-13 LAB — LIPID PANEL
Cholesterol: 145 mg/dL (ref 0–200)
HDL: 27 mg/dL — ABNORMAL LOW (ref >40)
LDL Cholesterol: 69 mg/dL (ref 0–99)
Total CHOL/HDL Ratio: 5.4 RATIO
Triglycerides: 243 mg/dL — ABNORMAL HIGH (ref <150)
VLDL: 49 mg/dL — ABNORMAL HIGH (ref 0–40)

## 2019-04-13 LAB — CBC
HCT: 20.7 % — ABNORMAL LOW (ref 36.0–46.0)
HCT: 21.8 % — ABNORMAL LOW (ref 36.0–46.0)
Hemoglobin: 7.3 g/dL — ABNORMAL LOW (ref 12.0–15.0)
Hemoglobin: 7.4 g/dL — ABNORMAL LOW (ref 12.0–15.0)
MCH: 27.5 pg (ref 26.0–34.0)
MCH: 26.8 pg (ref 26.0–34.0)
MCHC: 35.3 g/dL (ref 30.0–36.0)
MCHC: 33.9 g/dL (ref 30.0–36.0)
MCV: 78.1 fL — ABNORMAL LOW (ref 80.0–100.0)
MCV: 79 fL — ABNORMAL LOW (ref 80.0–100.0)
Platelets: 129 10*3/uL — ABNORMAL LOW (ref 150–400)
Platelets: 120 10*3/uL — ABNORMAL LOW (ref 150–400)
RBC: 2.65 MIL/uL — ABNORMAL LOW (ref 3.87–5.11)
RBC: 2.76 MIL/uL — ABNORMAL LOW (ref 3.87–5.11)
RDW: 17.2 % — ABNORMAL HIGH (ref 11.5–15.5)
RDW: 17.5 % — ABNORMAL HIGH (ref 11.5–15.5)
WBC: 15.3 10*3/uL — ABNORMAL HIGH (ref 4.0–10.5)
WBC: 17.7 10*3/uL — ABNORMAL HIGH (ref 4.0–10.5)
nRBC: 2.6 % — ABNORMAL HIGH (ref 0.0–0.2)
nRBC: 2.3 % — ABNORMAL HIGH (ref 0.0–0.2)

## 2019-04-13 LAB — RENAL FUNCTION PANEL
Albumin: 1.3 g/dL — ABNORMAL LOW (ref 3.5–5.0)
Albumin: 1.3 g/dL — ABNORMAL LOW (ref 3.5–5.0)
Anion gap: 14 (ref 5–15)
Anion gap: 11 (ref 5–15)
BUN: 55 mg/dL — ABNORMAL HIGH (ref 6–20)
BUN: 46 mg/dL — ABNORMAL HIGH (ref 6–20)
CO2: 21 mmol/L — ABNORMAL LOW (ref 22–32)
CO2: 21 mmol/L — ABNORMAL LOW (ref 22–32)
Calcium: 7.1 mg/dL — ABNORMAL LOW (ref 8.9–10.3)
Calcium: 6.7 mg/dL — ABNORMAL LOW (ref 8.9–10.3)
Chloride: 106 mmol/L (ref 98–111)
Chloride: 108 mmol/L (ref 98–111)
Creatinine, Ser: 3.55 mg/dL — ABNORMAL HIGH (ref 0.44–1.00)
Creatinine, Ser: 2.93 mg/dL — ABNORMAL HIGH (ref 0.44–1.00)
GFR calc Af Amer: 15 mL/min — ABNORMAL LOW (ref >60)
GFR calc Af Amer: 19 mL/min — ABNORMAL LOW (ref >60)
GFR calc non Af Amer: 13 mL/min — ABNORMAL LOW (ref >60)
GFR calc non Af Amer: 17 mL/min — ABNORMAL LOW (ref >60)
Glucose, Bld: 99 mg/dL (ref 70–99)
Glucose, Bld: 134 mg/dL — ABNORMAL HIGH (ref 70–99)
Phosphorus: 4.9 mg/dL — ABNORMAL HIGH (ref 2.5–4.6)
Phosphorus: 3.7 mg/dL (ref 2.5–4.6)
Potassium: 4.7 mmol/L (ref 3.5–5.1)
Potassium: 4.3 mmol/L (ref 3.5–5.1)
Sodium: 141 mmol/L (ref 135–145)
Sodium: 140 mmol/L (ref 135–145)

## 2019-04-13 LAB — HEMOGLOBIN A1C
Hgb A1c MFr Bld: 7.5 % — ABNORMAL HIGH (ref 4.8–5.6)
Mean Plasma Glucose: 168.55 mg/dL

## 2019-04-13 LAB — VITAMIN D 25 HYDROXY (VIT D DEFICIENCY, FRACTURES): Vit D, 25-Hydroxy: 7.77 ng/mL — ABNORMAL LOW (ref 30–100)

## 2019-04-13 LAB — MAGNESIUM
Magnesium: 2.4 mg/dL (ref 1.7–2.4)
Magnesium: 2.3 mg/dL (ref 1.7–2.4)
Magnesium: 2.2 mg/dL (ref 1.7–2.4)

## 2019-04-13 LAB — POCT ACTIVATED CLOTTING TIME
Activated Clotting Time: 136 seconds
Activated Clotting Time: 180 seconds
Activated Clotting Time: 186 seconds
Activated Clotting Time: 186 seconds
Activated Clotting Time: 191 seconds
Activated Clotting Time: 186 seconds
Activated Clotting Time: 191 seconds
Activated Clotting Time: 197 seconds

## 2019-04-13 LAB — COMPREHENSIVE METABOLIC PANEL
ALT: 17 U/L (ref 0–44)
AST: 57 U/L — ABNORMAL HIGH (ref 15–41)
Albumin: 1.3 g/dL — ABNORMAL LOW (ref 3.5–5.0)
Alkaline Phosphatase: 80 U/L (ref 38–126)
Anion gap: 13 (ref 5–15)
BUN: 65 mg/dL — ABNORMAL HIGH (ref 6–20)
CO2: 19 mmol/L — ABNORMAL LOW (ref 22–32)
Calcium: 7 mg/dL — ABNORMAL LOW (ref 8.9–10.3)
Chloride: 107 mmol/L (ref 98–111)
Creatinine, Ser: 4.19 mg/dL — ABNORMAL HIGH (ref 0.44–1.00)
GFR calc Af Amer: 13 mL/min — ABNORMAL LOW (ref >60)
GFR calc non Af Amer: 11 mL/min — ABNORMAL LOW (ref >60)
Glucose, Bld: 111 mg/dL — ABNORMAL HIGH (ref 70–99)
Potassium: 4.3 mmol/L (ref 3.5–5.1)
Sodium: 139 mmol/L (ref 135–145)
Total Bilirubin: 0.9 mg/dL (ref 0.3–1.2)
Total Protein: 4.7 g/dL — ABNORMAL LOW (ref 6.5–8.1)

## 2019-04-13 LAB — GLUCOSE, CAPILLARY
Glucose-Capillary: 116 mg/dL — ABNORMAL HIGH (ref 70–99)
Glucose-Capillary: 98 mg/dL (ref 70–99)
Glucose-Capillary: 121 mg/dL — ABNORMAL HIGH (ref 70–99)
Glucose-Capillary: 138 mg/dL — ABNORMAL HIGH (ref 70–99)
Glucose-Capillary: 143 mg/dL — ABNORMAL HIGH (ref 70–99)

## 2019-04-13 LAB — CULTURE, BLOOD (ROUTINE X 2): Culture: NO GROWTH

## 2019-04-13 LAB — PHOSPHORUS
Phosphorus: 5.9 mg/dL — ABNORMAL HIGH (ref 2.5–4.6)
Phosphorus: 4.4 mg/dL (ref 2.5–4.6)

## 2019-04-13 LAB — C-REACTIVE PROTEIN: CRP: 19.2 mg/dL — ABNORMAL HIGH (ref <1.0)

## 2019-04-13 LAB — D-DIMER, QUANTITATIVE: D-Dimer, Quant: 7.78 ug/mL-FEU — ABNORMAL HIGH (ref 0.00–0.50)

## 2019-04-13 MED ORDER — SODIUM CHLORIDE 0.9 % IV SOLN
250.0000 [IU]/h | INTRAVENOUS | Status: DC
  Administered 2019-04-13: 12:00:00 250 [IU]/h via INTRAVENOUS_CENTRAL
  Administered 2019-04-14 (×2): 750 [IU]/h via INTRAVENOUS_CENTRAL
  Administered 2019-04-15: 500 [IU]/h via INTRAVENOUS_CENTRAL
  Filled 2019-04-13 (×6): qty 2

## 2019-04-13 MED ORDER — HALOPERIDOL LACTATE 5 MG/ML IJ SOLN
INTRAMUSCULAR | Status: AC
  Filled 2019-04-13: qty 1

## 2019-04-13 MED ORDER — PRO-STAT SUGAR FREE PO LIQD
30.0000 mL | Freq: Two times a day (BID) | ORAL | Status: DC
  Administered 2019-04-13 – 2019-04-14 (×3): 30 mL
  Filled 2019-04-13 (×3): qty 30

## 2019-04-13 MED ORDER — ZINC SULFATE 220 (50 ZN) MG PO CAPS
220.0000 mg | ORAL_CAPSULE | Freq: Every day | ORAL | Status: DC
  Administered 2019-04-13 – 2019-04-14 (×2): 220 mg
  Filled 2019-04-13 (×2): qty 1

## 2019-04-13 MED ORDER — VITAMIN C 500 MG/5ML PO SYRP
100.0000 mg | ORAL_SOLUTION | Freq: Every day | ORAL | Status: DC
  Administered 2019-04-13 – 2019-04-14 (×2): 100 mg
  Filled 2019-04-13 (×3): qty 1

## 2019-04-13 MED ORDER — VITAL HIGH PROTEIN PO LIQD
1000.0000 mL | ORAL | Status: DC
  Administered 2019-04-13: 13:00:00 1000 mL

## 2019-04-13 MED ORDER — PANTOPRAZOLE SODIUM 40 MG PO PACK
40.0000 mg | PACK | ORAL | Status: DC
  Administered 2019-04-14: 09:00:00 40 mg
  Filled 2019-04-13 (×2): qty 20

## 2019-04-13 MED ORDER — SODIUM CHLORIDE 0.9 % IV SOLN
2.0000 g | Freq: Two times a day (BID) | INTRAVENOUS | Status: AC
  Administered 2019-04-13 (×2): 2 g via INTRAVENOUS
  Filled 2019-04-13 (×2): qty 2

## 2019-04-13 MED ORDER — HEPARIN SODIUM (PORCINE) 1000 UNIT/ML DIALYSIS: 1000.0000 [IU] | INTRAMUSCULAR | Status: DC | PRN

## 2019-04-13 MED ORDER — HALOPERIDOL LACTATE 5 MG/ML IJ SOLN
1.0000 mg | Freq: Once | INTRAMUSCULAR | Status: AC
  Administered 2019-04-13: 2 mg via INTRAVENOUS

## 2019-04-13 MED ORDER — HEPARIN BOLUS VIA INFUSION (CRRT)
1000.0000 [IU] | INTRAVENOUS | Status: DC | PRN
  Administered 2019-04-13: 1000 [IU] via INTRAVENOUS_CENTRAL
  Filled 2019-04-13: qty 1000

## 2019-04-13 NOTE — Progress Notes (Signed)
Progress Note  Patient Name: Andrea Glenn Date of Encounter: 04/13/2019  Primary Cardiologist:   No primary care provider on file.   Subjective   Converted to NSR last night.  Only moans.  Non purposeful.  Moves all extremities.   Inpatient Medications    Scheduled Meds: . aspirin  81 mg Oral Daily  . chlorhexidine  15 mL Mouth Rinse BID  . Chlorhexidine Gluconate Cloth  6 each Topical Q0600  . darbepoetin (ARANESP) injection - DIALYSIS  100 mcg Intravenous Q Thu-HD  . dexamethasone (DECADRON) injection  6 mg Intravenous Q24H  . insulin aspart  0-9 Units Subcutaneous Q4H  . insulin glargine  10 Units Subcutaneous QHS  . mouth rinse  15 mL Mouth Rinse q12n4p  . metoprolol tartrate  2.5 mg Intravenous Q6H  . pantoprazole (PROTONIX) IV  40 mg Intravenous Q12H  . sodium chloride flush  3 mL Intravenous Q12H   Continuous Infusions: .  prismasol BGK 4/2.5 600 mL/hr at 04/13/19 0557  .  prismasol BGK 4/2.5 300 mL/hr at 04/12/19 1757  . sodium chloride Stopped (04/13/19 0200)  . sodium chloride    . amiodarone Stopped (04/12/19 2200)  . ceFEPime (MAXIPIME) IV    . prismasol BGK 4/2.5 1,500 mL/hr at 04/13/19 0517  . vancomycin     PRN Meds: sodium chloride, sodium chloride, acetaminophen, alteplase, heparin, heparin, heparin, Ipratropium-Albuterol, lidocaine (PF), lidocaine-prilocaine, ondansetron (ZOFRAN) IV, pentafluoroprop-tetrafluoroeth, polyethylene glycol, sodium chloride   Vital Signs    Vitals:   04/13/19 0730 04/13/19 0745 04/13/19 0800 04/13/19 0819  BP: 122/68 113/76 128/73   Pulse: (!) 57 (!) 58 66 (!) 58  Resp: (!) 25 13 16 13   Temp:   (!) 96.9 F (36.1 C)   TempSrc:   Axillary   SpO2: 94% 91% (!) 88% 90%  Weight:      Height:        Intake/Output Summary (Last 24 hours) at 04/13/2019 0824 Last data filed at 04/13/2019 0800 Gross per 24 hour  Intake 507.86 ml  Output 1199 ml  Net -691.14 ml   Filed Weights   04/11/19 0009 04/11/19 0228  04/13/19 0500  Weight: 64.2 kg 62.5 kg 61.8 kg    Telemetry    NSR - Personally Reviewed  ECG    SB, rate 46, no acute ST T wave changes - Personally Reviewed  Physical Exam   GEN:    Acutely ill.   Neck: No  JVD Cardiac: RRR, soft apical systolic murmur, no diastolic murmurs, rubs, or gallops.  Respiratory:   Decreased breath sounds with diffuse crackles GI: Soft, tender to palpation MS:   Mild diffuse edema; No deformity. Neuro:   moves extremities   Labs    Chemistry Recent Labs  Lab 04/11/19 0451 04/12/19 0710 04/12/19 1645 04/13/19 0447  NA 142 141 141 139  K 4.5 5.0 4.5 4.3  CL 107 106 111 107  CO2 15* 15* 14* 19*  GLUCOSE 172* 275* 229* 111*  BUN 70* 95* 103* 65*  CREATININE 5.74* 6.90* 6.66* 4.19*  CALCIUM 7.4* 7.3* 6.8* 7.0*  PROT 5.2* 5.0*  --  4.7*  ALBUMIN 1.5* 1.2* 1.2* 1.3*  AST 93* 66*  --  57*  ALT 20 17  --  17  ALKPHOS 59 64  --  80  BILITOT 0.7 0.4  --  0.9  GFRNONAA 7* 6* 6* 11*  GFRAA 9* 7* 7* 13*  ANIONGAP 20* 20* 16* 13     Hematology  Recent Labs  Lab 04/11/19 0451 04/11/19 1533 04/12/19 0710 04/13/19 0447  WBC 18.1*  --  16.0* 15.3*  RBC 3.17*  --  3.03* 2.65*  HGB 8.5* 8.6* 7.9* 7.3*  HCT 25.1* 25.9* 24.3* 20.7*  MCV 79.2*  --  80.2 78.1*  MCH 26.8  --  26.1 27.5  MCHC 33.9  --  32.5 35.3  RDW 17.9*  --  18.0* 17.2*  PLT 162  --  170 129*    Cardiac EnzymesNo results for input(s): TROPONINI in the last 168 hours. No results for input(s): TROPIPOC in the last 168 hours.   BNP Recent Labs  Lab 04/08/19 0422  BNP 1,163.8*     DDimer  Recent Labs  Lab 04/11/19 0451 04/12/19 0710 04/13/19 0447  DDIMER 7.83* 5.50* 7.78*     Radiology    CT ANGIO HEAD W OR WO CONTRAST  Result Date: 04/12/2019 CLINICAL DATA:  Stroke, follow-up. Decreased responsiveness. COVID-19 pneumonia. EXAM: CT ANGIOGRAPHY HEAD AND NECK TECHNIQUE: Multidetector CT imaging of the head and neck was performed using the standard protocol  during bolus administration of intravenous contrast. Multiplanar CT image reconstructions and MIPs were obtained to evaluate the vascular anatomy. Carotid stenosis measurements (when applicable) are obtained utilizing NASCET criteria, using the distal internal carotid diameter as the denominator. CONTRAST:  154mL OMNIPAQUE IOHEXOL 350 MG/ML SOLN COMPARISON:  CT head without contrast 04/12/2019 FINDINGS: CTA NECK FINDINGS Aortic arch: A 3 vessel arch configuration is present. Scratched at there is a common origin of the left common carotid artery in the innominate artery. No significant vascular calcifications are present. There is no stenosis or aneurysm. Aberrant right subclavian artery is present, a normal variant. Right carotid system: The right common carotid artery is within normal limits. Bifurcation is unremarkable. Cervical right ICA is normal. Left carotid system: The left common carotid artery is within normal limits. Minimal calcifications present at the bifurcation without a significant stenosis relative to the more distal vessel. The cervical left ICA is normal. Vertebral arteries: The left vertebral artery is the dominant vessel. Both vertebral arteries originate from the subclavian arteries without significant stenosis. There is no significant stenosis in either vertebral artery in the neck. Skeleton: Mild endplate degenerative changes are present C5-6 and C6-7. Vertebral body heights alignment are normal. The patient is edentulous. No focal lytic or blastic lesions are present. Other neck: Soft tissues of the neck are otherwise unremarkable. Salivary glands are normal. Thyroid is normal. Upper chest: Extensive bilateral airspace disease is present consistent with COVID-19 pneumonia Review of the MIP images confirms the above findings CTA HEAD FINDINGS Anterior circulation: Atherosclerotic changes are present within the cavernous internal carotid arteries bilaterally. There is no significant stenosis  through the ICA terminus. The A1 and M1 segments are normal. Anterior communicating artery is patent. MCA bifurcations are intact. Diffuse distal small vessel attenuation is present without a significant proximal stenosis or occlusion. There is no aneurysm. Posterior circulation: The left vertebral artery is the dominant vessel. PICA origins are visualized and normal. Basilar artery is normal. Both posterior cerebral arteries originate from basilar tip. PCA branch vessels are within normal limits proximally. There is moderate attenuation of distal branch vessels. Venous sinuses: The dural sinuses are patent. The straight sinus deep cerebral veins are intact. Cortical veins are unremarkable. No vascular malformation is evident. Anatomic variants: None Review of the MIP images confirms the above findings IMPRESSION: 1. Minimal atherosclerotic calcifications at the left carotid bifurcation without significant stenosis. 2. No significant  stenosis in the neck. 3. Aberrant right subclavian artery, a normal variant. 4. Diffuse distal small vessel disease without a significant proximal stenosis, aneurysm, or branch vessel occlusion within the Circle of Willis. Electronically Signed   By: San Morelle M.D.   On: 04/12/2019 16:58   DG Abd 1 View  Result Date: 04/12/2019 CLINICAL DATA:  NG tube placement EXAM: ABDOMEN - 1 VIEW COMPARISON:  July 22, 2017 FINDINGS: Tip the NG tube is seen projecting over the distal stomach. The bowel gas pattern is normal. No radio-opaque calculi or other significant radiographic abnormality are seen. Patchy airspace opacity seen at both lung bases. IMPRESSION: Tip of the NG tube over the distal stomach. Electronically Signed   By: Prudencio Pair M.D.   On: 04/12/2019 20:37   CT HEAD WO CONTRAST  Result Date: 04/12/2019 CLINICAL DATA:  Altered mental status. EXAM: CT HEAD WITHOUT CONTRAST TECHNIQUE: Contiguous axial images were obtained from the base of the skull through the  vertex without intravenous contrast. COMPARISON:  07/22/2017 FINDINGS: Brain: Chronic appearing bilateral basal ganglia lacunar infarcts. Low-density noted in the right cerebellar hemisphere compatible with acute to subacute infarction. No hemorrhage or hydrocephalus. Vascular: No hyperdense vessel or unexpected calcification. Skull: No acute calvarial abnormality. Sinuses/Orbits: Visualized paranasal sinuses and mastoids clear. Orbital soft tissues unremarkable. Other: None IMPRESSION: Areas of low-density in the right cerebellar hemisphere concerning for acute to subacute infarcts. Chronic appearing bilateral basal ganglia lacunar infarcts. Electronically Signed   By: Rolm Baptise M.D.   On: 04/12/2019 10:41   DG CHEST PORT 1 VIEW  Result Date: 04/12/2019 CLINICAL DATA:  Sepsis EXAM: PORTABLE CHEST 1 VIEW COMPARISON:  04/10/2011 FINDINGS: Diffuse airspace disease throughout the lungs. No significant change since prior study. No effusions. Heart is normal size. Interval placement of right dialysis catheter with the tip in the lower right atrium. No pneumothorax. IMPRESSION: Interval placement of right dialysis catheter. The tip is in the lower right atrium. No pneumothorax. Severe diffuse bilateral airspace disease, unchanged. Electronically Signed   By: Rolm Baptise M.D.   On: 04/12/2019 10:11   ECHOCARDIOGRAM COMPLETE  Result Date: 04/11/2019   ECHOCARDIOGRAM REPORT   Patient Name:   Andrea Glenn Date of Exam: 04/11/2019 Medical Rec #:  [id number]        Height:       64.0 in Accession #:    [id number]       Weight:       137.8 lb Date of Birth:  21-May-1960        BSA:          1.67 m Patient Age:    8 years         BP:           146/74 mmHg Patient Gender: F                HR:           106 bpm. Exam Location:  Inpatient Procedure: 2D Echo, Cardiac Doppler and Color Doppler Indications:    Atrial Fibrillation 427.31  History:        Patient has prior history of Echocardiogram examinations, most                  recent 04/06/2018. Risk Factors:Hypertension, Diabetes,                 Dyslipidemia and Current Smoker. Sepsis.  Sonographer:    Paulita Fujita RDCS Referring Phys: [place]  NEAL IMPRESSIONS  1. Left ventricular ejection fraction, by visual estimation, is 50 to 55%. The left ventricle has normal function. There is no left ventricular hypertrophy.  2. Left ventricular diastolic function could not be evaluated.  3. The left ventricle has no regional wall motion abnormalities.  4. Global right ventricle has normal systolic function.The right ventricular size is normal.  5. Left atrial size was normal.  6. Right atrial size was normal.  7. The mitral valve is normal in structure. Trivial mitral valve regurgitation. No evidence of mitral stenosis.  8. The tricuspid valve is normal in structure.  9. The aortic valve is tricuspid. Aortic valve regurgitation is not visualized. Mild aortic valve sclerosis without stenosis. 10. The pulmonic valve was normal in structure. Pulmonic valve regurgitation is not visualized. 11. The inferior vena cava is normal in size with greater than 50% respiratory variability, suggesting right atrial pressure of 3 mmHg. 12. HR 130-144 at time of study; normal LV function; trace MR and TR; catheter in right atrium. FINDINGS  Left Ventricle: Left ventricular ejection fraction, by visual estimation, is 50 to 55%. The left ventricle has normal function. The left ventricle has no regional wall motion abnormalities. There is no left ventricular hypertrophy. The left ventricular diastology could not be evaluated due to atrial fibrillation. Left ventricular diastolic function could not be evaluated. Normal left atrial pressure. Right Ventricle: The right ventricular size is normal.Global RV systolic function is has normal systolic function. The tricuspid regurgitant velocity is 2.12 m/s, and with an assumed right atrial pressure of 8 mmHg, the estimated right ventricular systolic  pressure is normal at 26.0 mmHg. Left Atrium: Left atrial size was normal in size. Right Atrium: Right atrial size was normal in size Pericardium: There is no evidence of pericardial effusion. Mitral Valve: The mitral valve is normal in structure. Trivial mitral valve regurgitation. No evidence of mitral valve stenosis by observation. Tricuspid Valve: The tricuspid valve is normal in structure. Tricuspid valve regurgitation is trivial. Aortic Valve: The aortic valve is tricuspid. Aortic valve regurgitation is not visualized. Mild aortic valve sclerosis is present, with no evidence of aortic valve stenosis. Pulmonic Valve: The pulmonic valve was normal in structure. Pulmonic valve regurgitation is not visualized. Pulmonic regurgitation is not visualized. Aorta: The aortic root is normal in size and structure. Venous: The inferior vena cava is normal in size with greater than 50% respiratory variability, suggesting right atrial pressure of 3 mmHg.  Additional Comments: HR 130-144 at time of study; normal LV function; trace MR and TR; catheter in right atrium.  LEFT VENTRICLE PLAX 2D LVIDd:         3.94 cm LVIDs:         3.39 cm LV PW:         0.77 cm LV IVS:        0.74 cm LV SV:         20 ml LV SV Index:   12.11  LV Volumes (MOD) LV area d, A2C:    30.70 cm LV area d, A4C:    23.60 cm LV area s, A2C:    19.90 cm LV area s, A4C:    18.60 cm LV major d, A2C:   7.87 cm LV major d, A4C:   6.59 cm LV major s, A2C:   6.25 cm LV major s, A4C:   6.15 cm LV vol d, MOD A2C: 101.0 ml LV vol d, MOD A4C: 70.8 ml LV vol s, MOD A2C: 52.3  ml LV vol s, MOD A4C: 47.3 ml LV SV MOD A2C:     48.7 ml LV SV MOD A4C:     70.8 ml LV SV MOD BP:      42.3 ml RIGHT VENTRICLE TAPSE (M-mode): 1.6 cm LEFT ATRIUM             Index       RIGHT ATRIUM           Index LA diam:        2.90 cm 1.74 cm/m  RA Area:     13.70 cm LA Vol (A2C):   42.3 ml 25.33 ml/m RA Volume:   39.50 ml  23.65 ml/m LA Vol (A4C):   34.1 ml 20.39 ml/m LA Biplane Vol:  41.7 ml 24.97 ml/m  AORTIC VALVE LVOT Vmax:   76.70 cm/s LVOT Vmean:  52.800 cm/s LVOT VTI:    0.140 m  AORTA Ao Root diam: 2.40 cm TRICUSPID VALVE TR Peak grad:   18.0 mmHg TR Vmax:        212.00 cm/s  SHUNTS Systemic VTI: 0.14 m  Kirk Ruths MD Electronically signed by Kirk Ruths MD Signature Date/Time: 04/11/2019/2:40:42 PM    Final     Cardiac Studies   Echo   1. Left ventricular ejection fraction, by visual estimation, is 50 to 55%. The left ventricle has normal function. There is no left ventricular hypertrophy. 2. Left ventricular diastolic function could not be evaluated. 3. The left ventricle has no regional wall motion abnormalities. 4. Global right ventricle has normal systolic function.The right ventricular size is normal. 5. Left atrial size was normal. 6. Right atrial size was normal. 7. The mitral valve is normal in structure. Trivial mitral valve regurgitation. No evidence of mitral stenosis. 8. The tricuspid valve is normal in structure. 9. The aortic valve is tricuspid. Aortic valve regurgitation is not visualized. Mild aortic valve sclerosis without stenosis. 10. The pulmonic valve was normal in structure. Pulmonic valve regurgitation is not visualized. 11. The inferior vena cava is normal in size with greater than 50% respiratory variability, suggesting right atrial pressure of 3 mmHg. 12. HR 130-144 at time of study; normal LV function; trace MR and TR; catheter in right atrium.  Patient Profile     58 y.o. female  with a hx of HTN, HLD, poorly controled DM with neurophathy, FSGS with nephrotic syndrome, tobacco smoking and CKD IV who is being seen for the evaluation of elevated troponin at the request of Dr. Nori Riis.   She has COVID 19.    Assessment & Plan    ELEVATED TROPONIN:  Might be demand ischemia.   Trended down and EF OK.  No further work up.   ACUTE ON CHRONIC RENAL INSUFFICIENCY:  Was getting dialysis.   Given hemodynamics she will be on CRRT.   900 cc removed.  Continue pre nephrologly  ACUTE RESPIRATORY FAILURE:    Per primary team CCM.    ATRIAL FIB:   Converted to NSR.  Sinus brady.  Amiodarone was stopped.  Agree with discontinuing this now.  OK to hold beta blocker.   AMS:  Evaluated by neurology.   Would have indicate for systemic heparin for now given greater than 48 hours atrial fib but will defer to neurology as this is a marginal indication now that she is in NSR.   Marland Kitchen    For questions or updates, please contact [place] Please consult [email or web address] for contact info under Cardiology/STEMI.   Signed,  Minus Breeding, MD  04/13/2019, 8:24 AM

## 2019-04-13 NOTE — Progress Notes (Signed)
Subjective: Remains encephalopathic  Exam: Vitals:   04/13/19 1100 04/13/19 1115  BP: (!) 157/76 (!) 155/74  Pulse: 65 65  Resp: 14 12  Temp:    SpO2: 98% 98%   Gen: In bed, NAD Resp: non-labored breathing, no acute distress Abd: soft, nt  Neuro: MS: Opens eyes to noxious stimulation, she does not follow commands today CN: Pupils equal round reactive, doll's eye intact, blinks to eyelid stimulation bilaterally Motor: She moves all extremities to noxious stimulation, I continue to suspect some left hemiparesis Sensory: As above  CTA head and neck-negative for LVO LDL-69 Echo from 12/23 there is no clear embolic source  Impression: 58 year old female with acute mental status change and embolic appearing cerebellar strokes.  I continue to suspect that her mental status is multifactorial including her renal failure coupled with likely embolic shower.  MRI is pending.  Recommendations: 1) MRI brain 2) EEG, likely will be tomorrow  3) neurology will continue to follow  Roland Rack, MD [facility] [phone]  If 7pm- 7am, please page neurology on call as listed in [place].

## 2019-04-13 NOTE — Progress Notes (Signed)
NAME:  Andrea Glenn, MRN:  [id number], DOB:  1961/01/30, LOS: 5 ADMISSION DATE:  03/30/2019, CONSULTATION DATE:  04/12/2019 REFERRING MD: Dolores Hoose CHIEF COMPLAINT:  Hypoxia, AMS    Brief History   58 yo female presented to ER with leg swelling and pain.  Found to have4 COVID 19 pneumonia.  Has hx of nephrotic syndrome with FSGS and DM nephropathy, CKD 4.  She was previously treated with prograf, and last received rituximab in October 2020.  Found to have worsening renal fx.  Had fever 101.8.  required supplemental oxygen.  U/A suggestive of UTI.  Found to have elevated troponin and a fib with RVR.  Had HD catheter placed with plan for iHD.  Had worsening mental status.  CT head suggestive of acute CVA.  She was transferred to ICU.  Past Medical History  A fib, CKD 4, DM, DM nephropathy, DM neuropathy, FSGS, Emphysema, Frequent falls, HLD, HTN, Pancreatitis, Shingles, RLS, Tremor  Significant Hospital Events   12/20 Admitted 12/24 transfer to ICU 12/25 covert to sinus bradycardia >> amiodarone held  Consults:  Nephrology Neurology Cardiology  Procedures:  12/22 right IJ tunneled HD catheter  Significant Diagnostic Tests:  Echo 12/23 >> EF 50 to 55% CT head 12/24 >> low density Rt cerebellar hemisphere concerning for acute to subacute infarcts, chronic b/l BG lacunar infarcts CT angio head/neck 12/24 >> calcification Lt carotid bifurcation, diffuse small vessel disease  Micro Data:  12/20 SARS Cov2 >> positive 12/20 Blood >> Bacillus  12/20 Urine >> Klebsiella-ampicillin resistant, nitrofurantoin intermediate 12/24 Blood >>  COVID 19 treatment:  Remdesivir 12/20 >>12/24 Dexamethasone 12/20 >>  Antimicrobials/COVID19 treatment  Azithromycin 12/19 x 1 dose Ceftriaxone 12/19>12/21, 12/23>12/24 Cefazolin 12/22 SCIP Cefepime 12/24 >> 12/25 Vancomycin 12/24 >> 12/25  Interim history/subjective:  Remains on CRRT.  On 15 liters oxygen.  Objective   Blood pressure 137/75,  pulse (!) 59, temperature (!) 96.9 F (36.1 C), temperature source Axillary, resp. rate 14, height 5\' 4"  (1.626 m), weight 61.8 kg, SpO2 92 %.        Intake/Output Summary (Last 24 hours) at 04/13/2019 0912 Last data filed at 04/13/2019 0900 Gross per 24 hour  Intake 507.86 ml  Output 1305 ml  Net -797.14 ml   Filed Weights   04/11/19 0009 04/11/19 0228 04/13/19 0500  Weight: 64.2 kg 62.5 kg 61.8 kg    Examination:  General - somnolent Eyes - pupils reactive ENT - no sinus tenderness, no stridor Cardiac - regular rate/rhythm, no murmur Chest - b/l crackles Abdomen - soft, non tender, + bowel sounds Extremities - no cyanosis, clubbing, or edema Skin - no rashes Neuro - not following commands, moves extremities randomly, moans intermittently   Resolved Hospital Problem list     Assessment & Plan:   Acute hypoxic respiratory failure from COVID 19 pneumonia. - goal SpO2 85 to 95% - completed remdesivir - day 6/10 of decadron - check vit D level - zinc, vit C  Compromised airway in setting of altered mental status. - monitor need for ETT  Acute metabolic encephalopathy from hypoxia, renal failure, COVID 19 pneumonia, and UTI. - monitor neuro status  New onset A fib with RVR >> converted to sinus bradycardia 12/25. Elevated troponin from demand ischemia. - monitor hemodynamics  Bacillus species in blood culture >> likely contaminate. Klebsiella UTI. - complete ABx on 12/25  AKI from ATN. CKD 4 with FSGS and DM nephropathy. Metabolic acidosis. - continue CRRT >> defer to nephrology when she could  transition to iHD  Poorly controlled DM type 2 with steroid induced hyperglycemia. - SSI with lantus  Anemia of critical illness and chronic disease. - f/u CBC - transfuse for Hb < 7 or significant bleeding  Best practice:  Diet: tube feeds DVT prophylaxis: SCDs GI prophylaxis: protonix Mobility: Bedrest Code Status: Full Disposition: ICU  Labs     CMP Latest Ref Rng & Units 04/13/2019 04/12/2019 04/12/2019  Glucose 70 - 99 mg/dL 111(H) 229(H) 275(H)  BUN 6 - 20 mg/dL 65(H) 103(H) 95(H)  Creatinine 0.44 - 1.00 mg/dL 4.19(H) 6.66(H) 6.90(H)  Sodium 135 - 145 mmol/L 139 141 141  Potassium 3.5 - 5.1 mmol/L 4.3 4.5 5.0  Chloride 98 - 111 mmol/L 107 111 106  CO2 22 - 32 mmol/L 19(L) 14(L) 15(L)  Calcium 8.9 - 10.3 mg/dL 7.0(L) 6.8(L) 7.3(L)  Total Protein 6.5 - 8.1 g/dL 4.7(L) - 5.0(L)  Total Bilirubin 0.3 - 1.2 mg/dL 0.9 - 0.4  Alkaline Phos 38 - 126 U/L 80 - 64  AST 15 - 41 U/L 57(H) - 66(H)  ALT 0 - 44 U/L 17 - 17    CBC Latest Ref Rng & Units 04/13/2019 04/12/2019 04/11/2019  WBC 4.0 - 10.5 K/uL 15.3(H) 16.0(H) -  Hemoglobin 12.0 - 15.0 g/dL 7.3(L) 7.9(L) 8.6(L)  Hematocrit 36.0 - 46.0 % 20.7(L) 24.3(L) 25.9(L)  Platelets 150 - 400 K/uL 129(L) 170 -    ABG    Component Value Date/Time   PHART 7.246 (L) 04/12/2019 0744   PCO2ART 32.5 04/12/2019 0744   PO2ART 64.7 (L) 04/12/2019 0744   HCO3 14.0 (L) 04/12/2019 0744   TCO2 28 10/21/2009 2057   ACIDBASEDEF 12.2 (H) 04/12/2019 0744   O2SAT 90.2 04/12/2019 0744    CBG (last 3)  Recent Labs    04/12/19 2340 04/13/19 0322 04/13/19 0750  GLUCAP 158* 116* 98    CC time 34 minutes  Chesley Mires, MD [place] 04/13/2019, 9:30 AM

## 2019-04-13 NOTE — Progress Notes (Signed)
FPTS Social Note  FPTS appreciates excellent care provided by CCM for this patient as well as numerous consulting physicians.  We will continue to follow socially and be ready to take patient back when she is stable for the floor.  [place] Constable, D.O.  PGY-2 Family Medicine  04/13/2019 8:08 AM

## 2019-04-13 NOTE — Progress Notes (Signed)
Andrea Glenn  Assessment/ Plan: Pt is a 58 y.o. yo female  with history of poorly controlled DM, HTN, HLD, nephrotic syndrome with biopsy-proven FSGS and diabetic nephropathy s/p prograf and rituximab, CKD stage IV with creatinine between 3-4 at baseline, presented with worsening edema, Covid 19+, seen as a consultation for AKI on CKD.  #AKI on CKD stage IV with fluid overload: She has biopsy-proven FSGS and diabetic changes and follows at [facility]-  Has not responded to any treatment and had progressive dz.  She had received Prograf and subsequently 2 doses of rituximab.  She now has UTI and COVID-19 positive. Kidney US: No hydronephrosis.   Renal parameters  worsening including hyperkalemia, acidosis and uremia. S/p  placement of tunneled catheter and first HD early 12/23.  Attempting for second treatment on 12/24 but had acute worsening of status req transfer to [facility]M and CRRT.  I am certain that she is going to be long term HD requiring- starting CLIP when more stable.  Given instability at present feel we need to continue CRRT for now   #Fluid overload/acute pulmonary edema: Refractory to diuretics therefore restarting dialysis.  Still making some urine-  Also UF with CRRT- sats holding on high flow o2-  Difficult to know if hypoxia due to COVID or volume  -  receiving appropriate treatment for COVID  #Metabolic acidosis:  Improving with RRT  #Hyperkalemia: better- 4 K bath  #COVID-19 positive: Receiving remdesivir and Decadron by primary team.  #UTI: On ceftriaxone.    Anemia-  iron stores low , no iron yet-    giving ESA , transfuse when needed  Bones-  High phos-  Not biggest issue right now, dec with CRRT  Subjective: Events noted-  Had dec MS, hypothermia, hypotension- moved to 3100 and started on CRRT looks at around 1800-  No UOP  Removed around 900 with CRRT-  Neuro worried about embolic CVA-   Has not required intubation - some brady on  amio   Objective Vital signs in last 24 hours: Vitals:   04/13/19 0400 04/13/19 0500 04/13/19 0600 04/13/19 0700  BP: 108/65 127/64 135/70 127/67  Pulse: (!) 53 62 (!) 57 (!) 56  Resp: 12 15 19 18   Temp: (!) (P) 97.3 F (36.3 C)     TempSrc: (P) Axillary     SpO2: 100% (!) 88% 97% 99%  Weight:  61.8 kg    Height:       Weight change:   Intake/Output Summary (Last 24 hours) at 04/13/2019 0741 Last data filed at 04/13/2019 0700 Gross per 24 hour  Intake 507.86 ml  Output 1078 ml  Net -570.14 ml       Labs: Basic Metabolic Panel: Recent Labs  Lab 04/12/19 0710 04/12/19 1645 04/13/19 0447  NA 141 141 139  K 5.0 4.5 4.3  CL 106 111 107  CO2 15* 14* 19*  GLUCOSE 275* 229* 111*  BUN 95* 103* 65*  CREATININE 6.90* 6.66* 4.19*  CALCIUM 7.3* 6.8* 7.0*  PHOS 11.2* 10.7* 5.9*   Liver Function Tests: Recent Labs  Lab 04/11/19 0451 04/12/19 0710 04/12/19 1645 04/13/19 0447  AST 93* 66*  --  57*  ALT 20 17  --  17  ALKPHOS 59 64  --  80  BILITOT 0.7 0.4  --  0.9  PROT 5.2* 5.0*  --  4.7*  ALBUMIN 1.5* 1.2* 1.2* 1.3*   No results for input(s): LIPASE, AMYLASE in the last 168 hours.  No results for input(s): AMMONIA in the last 168 hours. CBC: Recent Labs  Lab 04/01/2019 2359 04/08/19 1254 04/10/19 2359 04/11/19 0451 04/11/19 1533 04/12/19 0710 04/13/19 0447  WBC 11.4* 10.3 16.8* 18.1*  --  16.0* 15.3*  NEUTROABS 9.8*  --   --   --   --   --   --   HGB 9.4* 7.9* 7.9* 8.5* 8.6* 7.9* 7.3*  HCT 30.7* 25.3* 24.2* 25.1* 25.9* 24.3* 20.7*  MCV 87.2 86.9 82.9 79.2*  --  80.2 78.1*  PLT 177 160 182 162  --  170 129*   Cardiac Enzymes: No results for input(s): CKTOTAL, CKMB, CKMBINDEX, TROPONINI in the last 168 hours. CBG: Recent Labs  Lab 04/12/19 1236 04/12/19 1531 04/12/19 1922 04/12/19 2340 04/13/19 0322  GLUCAP 214* 224* 199* 158* 116*    Iron Studies:  Recent Labs    04/12/19 0710  IRON 74  TIBC NOT CALCULATED  FERRITIN 7,097*    Studies/Results: CT ANGIO HEAD W OR WO CONTRAST  Result Date: 04/12/2019 CLINICAL DATA:  Stroke, follow-up. Decreased responsiveness. COVID-19 pneumonia. EXAM: CT ANGIOGRAPHY HEAD AND NECK TECHNIQUE: Multidetector CT imaging of the head and neck was performed using the standard protocol during bolus administration of intravenous contrast. Multiplanar CT image reconstructions and MIPs were obtained to evaluate the vascular anatomy. Carotid stenosis measurements (when applicable) are obtained utilizing NASCET criteria, using the distal internal carotid diameter as the denominator. CONTRAST:  123mL OMNIPAQUE IOHEXOL 350 MG/ML SOLN COMPARISON:  CT head without contrast 04/12/2019 FINDINGS: CTA NECK FINDINGS Aortic arch: A 3 vessel arch configuration is present. Scratched at there is a common origin of the left common carotid artery in the innominate artery. No significant vascular calcifications are present. There is no stenosis or aneurysm. Aberrant right subclavian artery is present, a normal variant. Right carotid system: The right common carotid artery is within normal limits. Bifurcation is unremarkable. Cervical right ICA is normal. Left carotid system: The left common carotid artery is within normal limits. Minimal calcifications present at the bifurcation without a significant stenosis relative to the more distal vessel. The cervical left ICA is normal. Vertebral arteries: The left vertebral artery is the dominant vessel. Both vertebral arteries originate from the subclavian arteries without significant stenosis. There is no significant stenosis in either vertebral artery in the neck. Skeleton: Mild endplate degenerative changes are present C5-6 and C6-7. Vertebral body heights alignment are normal. The patient is edentulous. No focal lytic or blastic lesions are present. Other neck: Soft tissues of the neck are otherwise unremarkable. Salivary glands are normal. Thyroid is normal. Upper chest: Extensive  bilateral airspace disease is present consistent with COVID-19 pneumonia Review of the MIP images confirms the above findings CTA HEAD FINDINGS Anterior circulation: Atherosclerotic changes are present within the cavernous internal carotid arteries bilaterally. There is no significant stenosis through the ICA terminus. The A1 and M1 segments are normal. Anterior communicating artery is patent. MCA bifurcations are intact. Diffuse distal small vessel attenuation is present without a significant proximal stenosis or occlusion. There is no aneurysm. Posterior circulation: The left vertebral artery is the dominant vessel. PICA origins are visualized and normal. Basilar artery is normal. Both posterior cerebral arteries originate from basilar tip. PCA branch vessels are within normal limits proximally. There is moderate attenuation of distal branch vessels. Venous sinuses: The dural sinuses are patent. The straight sinus deep cerebral veins are intact. Cortical veins are unremarkable. No vascular malformation is evident. Anatomic variants: None Review of the MIP  images confirms the above findings IMPRESSION: 1. Minimal atherosclerotic calcifications at the left carotid bifurcation without significant stenosis. 2. No significant stenosis in the neck. 3. Aberrant right subclavian artery, a normal variant. 4. Diffuse distal small vessel disease without a significant proximal stenosis, aneurysm, or branch vessel occlusion within the Circle of Willis. Electronically Signed   By: San Morelle M.D.   On: 04/12/2019 16:58   DG Abd 1 View  Result Date: 04/12/2019 CLINICAL DATA:  NG tube placement EXAM: ABDOMEN - 1 VIEW COMPARISON:  July 22, 2017 FINDINGS: Tip the NG tube is seen projecting over the distal stomach. The bowel gas pattern is normal. No radio-opaque calculi or other significant radiographic abnormality are seen. Patchy airspace opacity seen at both lung bases. IMPRESSION: Tip of the NG tube over the  distal stomach. Electronically Signed   By: Prudencio Pair M.D.   On: 04/12/2019 20:37   CT HEAD WO CONTRAST  Result Date: 04/12/2019 CLINICAL DATA:  Altered mental status. EXAM: CT HEAD WITHOUT CONTRAST TECHNIQUE: Contiguous axial images were obtained from the base of the skull through the vertex without intravenous contrast. COMPARISON:  07/22/2017 FINDINGS: Brain: Chronic appearing bilateral basal ganglia lacunar infarcts. Low-density noted in the right cerebellar hemisphere compatible with acute to subacute infarction. No hemorrhage or hydrocephalus. Vascular: No hyperdense vessel or unexpected calcification. Skull: No acute calvarial abnormality. Sinuses/Orbits: Visualized paranasal sinuses and mastoids clear. Orbital soft tissues unremarkable. Other: None IMPRESSION: Areas of low-density in the right cerebellar hemisphere concerning for acute to subacute infarcts. Chronic appearing bilateral basal ganglia lacunar infarcts. Electronically Signed   By: Rolm Baptise M.D.   On: 04/12/2019 10:41   DG CHEST PORT 1 VIEW  Result Date: 04/12/2019 CLINICAL DATA:  Sepsis EXAM: PORTABLE CHEST 1 VIEW COMPARISON:  04/10/2011 FINDINGS: Diffuse airspace disease throughout the lungs. No significant change since prior study. No effusions. Heart is normal size. Interval placement of right dialysis catheter with the tip in the lower right atrium. No pneumothorax. IMPRESSION: Interval placement of right dialysis catheter. The tip is in the lower right atrium. No pneumothorax. Severe diffuse bilateral airspace disease, unchanged. Electronically Signed   By: Rolm Baptise M.D.   On: 04/12/2019 10:11   ECHOCARDIOGRAM COMPLETE  Result Date: 04/11/2019   ECHOCARDIOGRAM REPORT   Patient Name:   Andrea Glenn Date of Exam: 04/11/2019 Medical Rec #:  [id number]        Height:       64.0 in Accession #:    [id number]       Weight:       137.8 lb Date of Birth:  1961/02/27        BSA:          1.67 m Patient Age:    73 years          BP:           146/74 mmHg Patient Gender: F                HR:           106 bpm. Exam Location:  Inpatient Procedure: 2D Echo, Cardiac Doppler and Color Doppler Indications:    Atrial Fibrillation 427.31  History:        Patient has prior history of Echocardiogram examinations, most                 recent 04/06/2018. Risk Factors:Hypertension, Diabetes,  Dyslipidemia and Current Smoker. Sepsis.  Sonographer:    Paulita Fujita RDCS Referring Phys: [place]  1. Left ventricular ejection fraction, by visual estimation, is 50 to 55%. The left ventricle has normal function. There is no left ventricular hypertrophy.  2. Left ventricular diastolic function could not be evaluated.  3. The left ventricle has no regional wall motion abnormalities.  4. Global right ventricle has normal systolic function.The right ventricular size is normal.  5. Left atrial size was normal.  6. Right atrial size was normal.  7. The mitral valve is normal in structure. Trivial mitral valve regurgitation. No evidence of mitral stenosis.  8. The tricuspid valve is normal in structure.  9. The aortic valve is tricuspid. Aortic valve regurgitation is not visualized. Mild aortic valve sclerosis without stenosis. 10. The pulmonic valve was normal in structure. Pulmonic valve regurgitation is not visualized. 11. The inferior vena cava is normal in size with greater than 50% respiratory variability, suggesting right atrial pressure of 3 mmHg. 12. HR 130-144 at time of study; normal LV function; trace MR and TR; catheter in right atrium. FINDINGS  Left Ventricle: Left ventricular ejection fraction, by visual estimation, is 50 to 55%. The left ventricle has normal function. The left ventricle has no regional wall motion abnormalities. There is no left ventricular hypertrophy. The left ventricular diastology could not be evaluated due to atrial fibrillation. Left ventricular diastolic function could not be  evaluated. Normal left atrial pressure. Right Ventricle: The right ventricular size is normal.Global RV systolic function is has normal systolic function. The tricuspid regurgitant velocity is 2.12 m/s, and with an assumed right atrial pressure of 8 mmHg, the estimated right ventricular systolic pressure is normal at 26.0 mmHg. Left Atrium: Left atrial size was normal in size. Right Atrium: Right atrial size was normal in size Pericardium: There is no evidence of pericardial effusion. Mitral Valve: The mitral valve is normal in structure. Trivial mitral valve regurgitation. No evidence of mitral valve stenosis by observation. Tricuspid Valve: The tricuspid valve is normal in structure. Tricuspid valve regurgitation is trivial. Aortic Valve: The aortic valve is tricuspid. Aortic valve regurgitation is not visualized. Mild aortic valve sclerosis is present, with no evidence of aortic valve stenosis. Pulmonic Valve: The pulmonic valve was normal in structure. Pulmonic valve regurgitation is not visualized. Pulmonic regurgitation is not visualized. Aorta: The aortic root is normal in size and structure. Venous: The inferior vena cava is normal in size with greater than 50% respiratory variability, suggesting right atrial pressure of 3 mmHg.  Additional Comments: HR 130-144 at time of study; normal LV function; trace MR and TR; catheter in right atrium.  LEFT VENTRICLE PLAX 2D LVIDd:         3.94 cm LVIDs:         3.39 cm LV PW:         0.77 cm LV IVS:        0.74 cm LV SV:         20 ml LV SV Index:   12.11  LV Volumes (MOD) LV area d, A2C:    30.70 cm LV area d, A4C:    23.60 cm LV area s, A2C:    19.90 cm LV area s, A4C:    18.60 cm LV major d, A2C:   7.87 cm LV major d, A4C:   6.59 cm LV major s, A2C:   6.25 cm LV major s, A4C:   6.15 cm LV vol  d, MOD A2C: 101.0 ml LV vol d, MOD A4C: 70.8 ml LV vol s, MOD A2C: 52.3 ml LV vol s, MOD A4C: 47.3 ml LV SV MOD A2C:     48.7 ml LV SV MOD A4C:     70.8 ml LV SV MOD BP:       42.3 ml RIGHT VENTRICLE TAPSE (M-mode): 1.6 cm LEFT ATRIUM             Index       RIGHT ATRIUM           Index LA diam:        2.90 cm 1.74 cm/m  RA Area:     13.70 cm LA Vol (A2C):   42.3 ml 25.33 ml/m RA Volume:   39.50 ml  23.65 ml/m LA Vol (A4C):   34.1 ml 20.39 ml/m LA Biplane Vol: 41.7 ml 24.97 ml/m  AORTIC VALVE LVOT Vmax:   76.70 cm/s LVOT Vmean:  52.800 cm/s LVOT VTI:    0.140 m  AORTA Ao Root diam: 2.40 cm TRICUSPID VALVE TR Peak grad:   18.0 mmHg TR Vmax:        212.00 cm/s  SHUNTS Systemic VTI: 0.14 m  Kirk Ruths MD Electronically signed by Kirk Ruths MD Signature Date/Time: 04/11/2019/2:40:42 PM    Final     Medications: Infusions: .  prismasol BGK 4/2.5 600 mL/hr at 04/13/19 0557  .  prismasol BGK 4/2.5 300 mL/hr at 04/12/19 1757  . sodium chloride Stopped (04/13/19 0200)  . sodium chloride    . amiodarone Stopped (04/12/19 2200)  . ceFEPime (MAXIPIME) IV Stopped (04/12/19 1304)  . prismasol BGK 4/2.5 1,500 mL/hr at 04/13/19 0517  . vancomycin      Scheduled Medications: . aspirin  81 mg Oral Daily  . chlorhexidine  15 mL Mouth Rinse BID  . Chlorhexidine Gluconate Cloth  6 each Topical Q0600  . darbepoetin (ARANESP) injection - DIALYSIS  100 mcg Intravenous Q Thu-HD  . dexamethasone (DECADRON) injection  6 mg Intravenous Q24H  . insulin aspart  0-9 Units Subcutaneous Q4H  . insulin glargine  10 Units Subcutaneous QHS  . mouth rinse  15 mL Mouth Rinse q12n4p  . metoprolol tartrate  2.5 mg Intravenous Q6H  . pantoprazole (PROTONIX) IV  40 mg Intravenous Q12H  . sodium chloride flush  3 mL Intravenous Q12H    have reviewed scheduled and prn medications.  Physical Exam: Patient was not examined directly because of COVID-19 pneumonia in order to lower exposure and to preserve PPE.  I have discussed with the patient, her nurse and primary team and reviewed the pertinent physical examination.    Andrea Glenn A Andrea Glenn 04/13/2019,7:41 AM  LOS: 5 days

## 2019-04-13 NOTE — Progress Notes (Signed)
CRITICAL VALUE ALERT  Critical Value:  Hemoglobin 7.3  Date & Time Notied:  [id number] 04/13/2019  Provider Notified: Dr. Halford Chessman  Orders Received/Actions taken: no new orders given, will continue to monitor patient closely

## 2019-04-13 NOTE — Progress Notes (Signed)
eLink Physician-Brief Progress Note Patient Name: Andrea Glenn DOB: May 01, 1960 MRN: [id number]   Date of Service  04/13/2019  HPI/Events of Note  Pt with agitated delirium, she's on her way to MRI  eICU Interventions  Haldol 2 mg iv x 1 ordered.        Kerry Kass Ogan 04/13/2019, 11:24 PM

## 2019-04-14 ENCOUNTER — Inpatient Hospital Stay (HOSPITAL_COMMUNITY): Payer: Medicaid Other

## 2019-04-14 DIAGNOSIS — I6349 Cerebral infarction due to embolism of other cerebral artery: Secondary | ICD-10-CM

## 2019-04-14 DIAGNOSIS — D72829 Elevated white blood cell count, unspecified: Secondary | ICD-10-CM

## 2019-04-14 DIAGNOSIS — Z992 Dependence on renal dialysis: Secondary | ICD-10-CM

## 2019-04-14 DIAGNOSIS — R1312 Dysphagia, oropharyngeal phase: Secondary | ICD-10-CM

## 2019-04-14 DIAGNOSIS — I6340 Cerebral infarction due to embolism of unspecified cerebral artery: Secondary | ICD-10-CM

## 2019-04-14 DIAGNOSIS — D631 Anemia in chronic kidney disease: Secondary | ICD-10-CM

## 2019-04-14 DIAGNOSIS — N189 Chronic kidney disease, unspecified: Secondary | ICD-10-CM

## 2019-04-14 DIAGNOSIS — I480 Paroxysmal atrial fibrillation: Secondary | ICD-10-CM

## 2019-04-14 DIAGNOSIS — E1159 Type 2 diabetes mellitus with other circulatory complications: Secondary | ICD-10-CM

## 2019-04-14 DIAGNOSIS — Z794 Long term (current) use of insulin: Secondary | ICD-10-CM

## 2019-04-14 LAB — RENAL FUNCTION PANEL
Albumin: 1.4 g/dL — ABNORMAL LOW (ref 3.5–5.0)
Albumin: 1.5 g/dL — ABNORMAL LOW (ref 3.5–5.0)
Anion gap: 13 (ref 5–15)
Anion gap: 10 (ref 5–15)
BUN: 39 mg/dL — ABNORMAL HIGH (ref 6–20)
BUN: 24 mg/dL — ABNORMAL HIGH (ref 6–20)
CO2: 22 mmol/L (ref 22–32)
CO2: 23 mmol/L (ref 22–32)
Calcium: 7.1 mg/dL — ABNORMAL LOW (ref 8.9–10.3)
Calcium: 7.4 mg/dL — ABNORMAL LOW (ref 8.9–10.3)
Chloride: 104 mmol/L (ref 98–111)
Chloride: 104 mmol/L (ref 98–111)
Creatinine, Ser: 2.2 mg/dL — ABNORMAL HIGH (ref 0.44–1.00)
Creatinine, Ser: 1.36 mg/dL — ABNORMAL HIGH (ref 0.44–1.00)
GFR calc Af Amer: 28 mL/min — ABNORMAL LOW (ref >60)
GFR calc Af Amer: 49 mL/min — ABNORMAL LOW (ref >60)
GFR calc non Af Amer: 24 mL/min — ABNORMAL LOW (ref >60)
GFR calc non Af Amer: 42 mL/min — ABNORMAL LOW (ref >60)
Glucose, Bld: 204 mg/dL — ABNORMAL HIGH (ref 70–99)
Glucose, Bld: 143 mg/dL — ABNORMAL HIGH (ref 70–99)
Phosphorus: 2.8 mg/dL (ref 2.5–4.6)
Phosphorus: 2.4 mg/dL — ABNORMAL LOW (ref 2.5–4.6)
Potassium: 3.9 mmol/L (ref 3.5–5.1)
Potassium: 5.2 mmol/L — ABNORMAL HIGH (ref 3.5–5.1)
Sodium: 139 mmol/L (ref 135–145)
Sodium: 137 mmol/L (ref 135–145)

## 2019-04-14 LAB — GLUCOSE, CAPILLARY
Glucose-Capillary: 191 mg/dL — ABNORMAL HIGH (ref 70–99)
Glucose-Capillary: 197 mg/dL — ABNORMAL HIGH (ref 70–99)
Glucose-Capillary: 132 mg/dL — ABNORMAL HIGH (ref 70–99)
Glucose-Capillary: 121 mg/dL — ABNORMAL HIGH (ref 70–99)
Glucose-Capillary: 149 mg/dL — ABNORMAL HIGH (ref 70–99)
Glucose-Capillary: 140 mg/dL — ABNORMAL HIGH (ref 70–99)
Glucose-Capillary: 148 mg/dL — ABNORMAL HIGH (ref 70–99)

## 2019-04-14 LAB — PREPARE RBC (CROSSMATCH)

## 2019-04-14 LAB — CBC
HCT: 19.9 % — ABNORMAL LOW (ref 36.0–46.0)
HCT: 27.7 % — ABNORMAL LOW (ref 36.0–46.0)
Hemoglobin: 6.8 g/dL — CL (ref 12.0–15.0)
Hemoglobin: 9.4 g/dL — ABNORMAL LOW (ref 12.0–15.0)
MCH: 26.7 pg (ref 26.0–34.0)
MCH: 27.3 pg (ref 26.0–34.0)
MCHC: 34.2 g/dL (ref 30.0–36.0)
MCHC: 33.9 g/dL (ref 30.0–36.0)
MCV: 78 fL — ABNORMAL LOW (ref 80.0–100.0)
MCV: 80.5 fL (ref 80.0–100.0)
Platelets: 121 10*3/uL — ABNORMAL LOW (ref 150–400)
Platelets: 107 10*3/uL — ABNORMAL LOW (ref 150–400)
RBC: 2.55 MIL/uL — ABNORMAL LOW (ref 3.87–5.11)
RBC: 3.44 MIL/uL — ABNORMAL LOW (ref 3.87–5.11)
RDW: 17.4 % — ABNORMAL HIGH (ref 11.5–15.5)
RDW: 16.4 % — ABNORMAL HIGH (ref 11.5–15.5)
WBC: 22.3 10*3/uL — ABNORMAL HIGH (ref 4.0–10.5)
WBC: 21.5 10*3/uL — ABNORMAL HIGH (ref 4.0–10.5)
nRBC: 1.6 % — ABNORMAL HIGH (ref 0.0–0.2)
nRBC: 1.5 % — ABNORMAL HIGH (ref 0.0–0.2)

## 2019-04-14 LAB — APTT: aPTT: 93 seconds — ABNORMAL HIGH (ref 24–36)

## 2019-04-14 LAB — POCT ACTIVATED CLOTTING TIME
Activated Clotting Time: 197 seconds
Activated Clotting Time: 186 seconds
Activated Clotting Time: 197 seconds
Activated Clotting Time: 197 seconds
Activated Clotting Time: 197 seconds
Activated Clotting Time: 213 seconds
Activated Clotting Time: 213 seconds
Activated Clotting Time: 219 seconds
Activated Clotting Time: 230 seconds
Activated Clotting Time: 230 seconds

## 2019-04-14 LAB — MAGNESIUM
Magnesium: 2.2 mg/dL (ref 1.7–2.4)
Magnesium: 2.4 mg/dL (ref 1.7–2.4)

## 2019-04-14 MED ORDER — VITAMIN D 25 MCG (1000 UNIT) PO TABS
1000.0000 [IU] | ORAL_TABLET | Freq: Every day | ORAL | Status: DC
  Administered 2019-04-14: 09:00:00 1000 [IU]
  Filled 2019-04-14 (×3): qty 1

## 2019-04-14 MED ORDER — SODIUM CHLORIDE 0.9% IV SOLUTION: Freq: Once | INTRAVENOUS | Status: AC

## 2019-04-14 MED ORDER — VITAL 1.5 CAL PO LIQD
1000.0000 mL | ORAL | Status: DC
  Administered 2019-04-14: 14:00:00 1000 mL
  Filled 2019-04-14 (×3): qty 1000

## 2019-04-14 MED ORDER — DEXMEDETOMIDINE HCL IN NACL 400 MCG/100ML IV SOLN
0.2000 ug/kg/h | INTRAVENOUS | Status: DC
  Administered 2019-04-14: 16:00:00 0.6 ug/kg/h via INTRAVENOUS
  Administered 2019-04-14: 02:00:00 0.4 ug/kg/h via INTRAVENOUS
  Filled 2019-04-14 (×2): qty 100

## 2019-04-14 MED ORDER — PRO-STAT SUGAR FREE PO LIQD: 30.0000 mL | Freq: Every day | ORAL | Status: DC

## 2019-04-14 MED ORDER — HALOPERIDOL LACTATE 5 MG/ML IJ SOLN: 0.5000 mg | INTRAMUSCULAR | Status: DC | PRN

## 2019-04-14 NOTE — Progress Notes (Signed)
[facility] NEPHROLOGY PROGRESS NOTE  Assessment/ Plan: Pt is a 58 y.o. yo female  with history of poorly controlled DM, HTN, HLD, nephrotic syndrome with biopsy-proven FSGS and diabetic nephropathy s/p prograf and rituximab, CKD stage IV with creatinine between 3-4 at baseline, presented with worsening edema, Covid 19+, seen as a consultation for AKI on CKD.  #AKI on CKD stage IV with fluid overload: She has biopsy-proven FSGS and diabetic changes and follows at [facility]-  Has not responded to any treatment and had progressive dz.  She had received Prograf and subsequently 2 doses of rituximab.  She now has UTI and COVID-19 positive. Kidney US: No hydronephrosis.   Renal parameters  worsened including hyperkalemia, acidosis and uremia. S/p  placement of tunneled catheter and first HD early 12/23.  Attempted for second treatment on 12/24 but had acute worsening of status req transfer to 70M and CRRT- started evening of 12/23.  I am certain that she is going to be long term HD requiring- starting CLIP when more stable.  Given still with relative instability- more MS at present feel we need to continue CRRT for now to get good clearance to see if MS improves.  Look to transition back to IHD soon  #Fluid overload/acute pulmonary edema: Refractory to diuretics therefore restarting dialysis.  Still making some urine-   UF with CRRT- sats holding on high flow o2- have been able to dec o2-  Difficult to know if hypoxia due to COVID or volume  -  receiving appropriate treatment for COVID  #Metabolic acidosis:  Improving with RRT  #Hyperkalemia: better- 4 K bath  #COVID-19 positive: Receiving remdesivir and Decadron by primary team.  #UTI: On ceftriaxone.    Anemia-  iron stores low , no iron yet-    giving ESA , transfuse when needed-  Needed 12/26  Bones-  High phos-  Not biggest issue right now, dec with CRRT  Subjective:  Delerium vs CVA sxms continue.  No issues with CRRT reported -  started heparin yest- not intubated-  Minimal UOP - removed 1800 with CRRT  Objective Vital signs in last 24 hours: Vitals:   04/14/19 0800 04/14/19 0815 04/14/19 0830 04/14/19 0845  BP: 133/68     Pulse: 69 64 66 72  Resp: 20 13 14 13   Temp: 97.8 F (36.6 C)     TempSrc: Oral     SpO2: (!) 89% (!) 89% 94% 93%  Weight:      Height:       Weight change: -1.6 kg  Intake/Output Summary (Last 24 hours) at 04/14/2019 0857 Last data filed at 04/14/2019 0800 Gross per 24 hour  Intake 1266.02 ml  Output 3180 ml  Net -1913.98 ml       Labs: Basic Metabolic Panel: Recent Labs  Lab 04/13/19 0848 04/13/19 1016 04/13/19 1518 04/14/19 0335  NA 141  --  140 139  K 4.7  --  4.3 3.9  CL 106  --  108 104  CO2 21*  --  21* 22  GLUCOSE 99  --  134* 204*  BUN 55*  --  46* 39*  CREATININE 3.55*  --  2.93* 2.20*  CALCIUM 7.1*  --  6.7* 7.1*  PHOS 4.9* 4.4 3.7 2.8   Liver Function Tests: Recent Labs  Lab 04/11/19 0451 04/12/19 0710 04/13/19 0447 04/13/19 0848 04/13/19 1518 04/14/19 0335  AST 93* 66* 57*  --   --   --   ALT 20 17 17   --   --   --  ALKPHOS 59 64 80  --   --   --   BILITOT 0.7 0.4 0.9  --   --   --   PROT 5.2* 5.0* 4.7*  --   --   --   ALBUMIN 1.5* 1.2* 1.3* 1.3* 1.3* 1.4*   No results for input(s): LIPASE, AMYLASE in the last 168 hours. No results for input(s): AMMONIA in the last 168 hours. CBC: Recent Labs  Lab 04/19/2019 2359 04/11/19 0451 04/12/19 0710 04/13/19 0447 04/13/19 0848 04/14/19 0335  WBC 11.4* 18.1* 16.0* 15.3* 17.7* 22.3*  NEUTROABS 9.8*  --   --   --   --   --   HGB 9.4* 8.5* 7.9* 7.3* 7.4* 6.8*  HCT 30.7* 25.1* 24.3* 20.7* 21.8* 19.9*  MCV 87.2 79.2* 80.2 78.1* 79.0* 78.0*  PLT 177 162 170 129* 120* 121*   Cardiac Enzymes: No results for input(s): CKTOTAL, CKMB, CKMBINDEX, TROPONINI in the last 168 hours. CBG: Recent Labs  Lab 04/13/19 1626 04/13/19 1929 04/14/19 0121 04/14/19 0354 04/14/19 0806  GLUCAP 138* 143* 191*  197* 132*    Iron Studies:  Recent Labs    04/12/19 0710  IRON 74  TIBC NOT CALCULATED  FERRITIN 7,097*   Studies/Results: CT ANGIO HEAD W OR WO CONTRAST  Result Date: 04/12/2019 CLINICAL DATA:  Stroke, follow-up. Decreased responsiveness. COVID-19 pneumonia. EXAM: CT ANGIOGRAPHY HEAD AND NECK TECHNIQUE: Multidetector CT imaging of the head and neck was performed using the standard protocol during bolus administration of intravenous contrast. Multiplanar CT image reconstructions and MIPs were obtained to evaluate the vascular anatomy. Carotid stenosis measurements (when applicable) are obtained utilizing NASCET criteria, using the distal internal carotid diameter as the denominator. CONTRAST:  160mL OMNIPAQUE IOHEXOL 350 MG/ML SOLN COMPARISON:  CT head without contrast 04/12/2019 FINDINGS: CTA NECK FINDINGS Aortic arch: A 3 vessel arch configuration is present. Scratched at there is a common origin of the left common carotid artery in the innominate artery. No significant vascular calcifications are present. There is no stenosis or aneurysm. Aberrant right subclavian artery is present, a normal variant. Right carotid system: The right common carotid artery is within normal limits. Bifurcation is unremarkable. Cervical right ICA is normal. Left carotid system: The left common carotid artery is within normal limits. Minimal calcifications present at the bifurcation without a significant stenosis relative to the more distal vessel. The cervical left ICA is normal. Vertebral arteries: The left vertebral artery is the dominant vessel. Both vertebral arteries originate from the subclavian arteries without significant stenosis. There is no significant stenosis in either vertebral artery in the neck. Skeleton: Mild endplate degenerative changes are present C5-6 and C6-7. Vertebral body heights alignment are normal. The patient is edentulous. No focal lytic or blastic lesions are present. Other neck: Soft  tissues of the neck are otherwise unremarkable. Salivary glands are normal. Thyroid is normal. Upper chest: Extensive bilateral airspace disease is present consistent with COVID-19 pneumonia Review of the MIP images confirms the above findings CTA HEAD FINDINGS Anterior circulation: Atherosclerotic changes are present within the cavernous internal carotid arteries bilaterally. There is no significant stenosis through the ICA terminus. The A1 and M1 segments are normal. Anterior communicating artery is patent. MCA bifurcations are intact. Diffuse distal small vessel attenuation is present without a significant proximal stenosis or occlusion. There is no aneurysm. Posterior circulation: The left vertebral artery is the dominant vessel. PICA origins are visualized and normal. Basilar artery is normal. Both posterior cerebral arteries originate from basilar tip. PCA  branch vessels are within normal limits proximally. There is moderate attenuation of distal branch vessels. Venous sinuses: The dural sinuses are patent. The straight sinus deep cerebral veins are intact. Cortical veins are unremarkable. No vascular malformation is evident. Anatomic variants: None Review of the MIP images confirms the above findings IMPRESSION: 1. Minimal atherosclerotic calcifications at the left carotid bifurcation without significant stenosis. 2. No significant stenosis in the neck. 3. Aberrant right subclavian artery, a normal variant. 4. Diffuse distal small vessel disease without a significant proximal stenosis, aneurysm, or branch vessel occlusion within the Circle of Willis. Electronically Signed   By: San Morelle M.D.   On: 04/12/2019 16:58   DG Abd 1 View  Result Date: 04/12/2019 CLINICAL DATA:  NG tube placement EXAM: ABDOMEN - 1 VIEW COMPARISON:  July 22, 2017 FINDINGS: Tip the NG tube is seen projecting over the distal stomach. The bowel gas pattern is normal. No radio-opaque calculi or other significant  radiographic abnormality are seen. Patchy airspace opacity seen at both lung bases. IMPRESSION: Tip of the NG tube over the distal stomach. Electronically Signed   By: Prudencio Pair M.D.   On: 04/12/2019 20:37   CT HEAD WO CONTRAST  Result Date: 04/12/2019 CLINICAL DATA:  Altered mental status. EXAM: CT HEAD WITHOUT CONTRAST TECHNIQUE: Contiguous axial images were obtained from the base of the skull through the vertex without intravenous contrast. COMPARISON:  07/22/2017 FINDINGS: Brain: Chronic appearing bilateral basal ganglia lacunar infarcts. Low-density noted in the right cerebellar hemisphere compatible with acute to subacute infarction. No hemorrhage or hydrocephalus. Vascular: No hyperdense vessel or unexpected calcification. Skull: No acute calvarial abnormality. Sinuses/Orbits: Visualized paranasal sinuses and mastoids clear. Orbital soft tissues unremarkable. Other: None IMPRESSION: Areas of low-density in the right cerebellar hemisphere concerning for acute to subacute infarcts. Chronic appearing bilateral basal ganglia lacunar infarcts. Electronically Signed   By: Rolm Baptise M.D.   On: 04/12/2019 10:41   MR BRAIN WO CONTRAST  Result Date: 04/14/2019 CLINICAL DATA:  Stroke follow-up. COVID-19. EXAM: MRI HEAD WITHOUT CONTRAST TECHNIQUE: Multiplanar, multiecho pulse sequences of the brain and surrounding structures were obtained without intravenous contrast. COMPARISON:  Head CT 04/12/2019 FINDINGS: Examination is severely degraded by motion. Additionally. An abbreviated scan protocol was used. Five series are provided for interpretation. Widespread multifocal acute ischemia within both cerebral and cerebellar hemispheres. The largest confluent area is in the right cerebellum. There is no midline shift or other mass effect IMPRESSION: 1. Severely motion degraded examination. 2. Widespread multifocal acute ischemia within both cerebral and cerebellar hemispheres. 3. No midline shift or other mass  effect. Electronically Signed   By: Ulyses Jarred M.D.   On: 04/14/2019 00:37   DG CHEST PORT 1 VIEW  Result Date: 04/12/2019 CLINICAL DATA:  Sepsis EXAM: PORTABLE CHEST 1 VIEW COMPARISON:  04/10/2011 FINDINGS: Diffuse airspace disease throughout the lungs. No significant change since prior study. No effusions. Heart is normal size. Interval placement of right dialysis catheter with the tip in the lower right atrium. No pneumothorax. IMPRESSION: Interval placement of right dialysis catheter. The tip is in the lower right atrium. No pneumothorax. Severe diffuse bilateral airspace disease, unchanged. Electronically Signed   By: Rolm Baptise M.D.   On: 04/12/2019 10:11    Medications: Infusions: .  prismasol BGK 4/2.5 600 mL/hr at 04/14/19 0118  .  prismasol BGK 4/2.5 300 mL/hr at 04/14/19 0845  . sodium chloride Stopped (04/13/19 1056)  . sodium chloride    . dexmedetomidine (PRECEDEX) IV infusion  0.4 mcg/kg/hr (04/14/19 0800)  . heparin 10,000 units/ 20 mL infusion syringe 750 Units/hr (04/14/19 0800)  . prismasol BGK 4/2.5 1,500 mL/hr at 04/14/19 [id number]    Scheduled Medications: . sodium chloride   Intravenous Once  . ascorbic acid  100 mg Per Tube Daily  . aspirin  81 mg Oral Daily  . chlorhexidine  15 mL Mouth Rinse BID  . Chlorhexidine Gluconate Cloth  6 each Topical Q0600  . darbepoetin (ARANESP) injection - DIALYSIS  100 mcg Intravenous Q Thu-HD  . dexamethasone (DECADRON) injection  6 mg Intravenous Q24H  . feeding supplement (PRO-STAT SUGAR FREE 64)  30 mL Per Tube BID  . feeding supplement (VITAL HIGH PROTEIN)  1,000 mL Per Tube Q24H  . insulin aspart  0-9 Units Subcutaneous Q4H  . insulin glargine  10 Units Subcutaneous QHS  . mouth rinse  15 mL Mouth Rinse q12n4p  . metoprolol tartrate  2.5 mg Intravenous Q6H  . pantoprazole sodium  40 mg Per Tube Q24H  . sodium chloride flush  3 mL Intravenous Q12H  . zinc sulfate  220 mg Per Tube Daily    have reviewed scheduled and prn  medications.  Physical Exam: Patient was not examined directly because of COVID-19 pneumonia in order to lower exposure and to preserve PPE.  I have discussed with the patient, her nurse and primary team and reviewed the pertinent physical examination.    Andrea Glenn A Enid Maultsby 04/14/2019,8:57 AM  LOS: 6 days

## 2019-04-14 NOTE — Progress Notes (Signed)
Patient vomited multiple times. Tube Feeds stopped and NG hooked up to low intermittent suction.

## 2019-04-14 NOTE — Progress Notes (Signed)
STROKE TEAM PROGRESS NOTE   INTERVAL HISTORY Her RN and RT are at the bedside.  Pt lying in bed, kept herself in fetal position, moaning intermittently, not following commands, no language output. On continuous CRRT now with heparin running in the system. Hb this am 6.8, on PRBC transfusion. WBC elevated.    OBJECTIVE Vitals:   04/14/19 0830 04/14/19 0845 04/14/19 0900 04/14/19 0915  BP:   117/65   Pulse: 66 72 (!) 57 (!) 58  Resp: 14 13 14 15   Temp:      TempSrc:      SpO2: 94% 93% 100% 95%  Weight:      Height:        CBC:  Recent Labs  Lab 03/26/2019 2359 04/13/19 0848 04/14/19 0335  WBC 11.4* 17.7* 22.3*  NEUTROABS 9.8*  --   --   HGB 9.4* 7.4* 6.8*  HCT 30.7* 21.8* 19.9*  MCV 87.2 79.0* 78.0*  PLT 177 120* 121*    Basic Metabolic Panel:  Recent Labs  Lab 04/13/19 1518 04/14/19 0335  NA 140 139  K 4.3 3.9  CL 108 104  CO2 21* 22  GLUCOSE 134* 204*  BUN 46* 39*  CREATININE 2.93* 2.20*  CALCIUM 6.7* 7.1*  MG 2.2 2.2  PHOS 3.7 2.8    Lipid Panel:     Component Value Date/Time   CHOL 145 04/13/2019 0447   CHOL 252 (H) 04/04/2018 1613   TRIG 243 (H) 04/13/2019 0447   HDL 27 (L) 04/13/2019 0447   HDL 51 04/04/2018 1613   CHOLHDL 5.4 04/13/2019 0447   VLDL 49 (H) 04/13/2019 0447   LDLCALC 69 04/13/2019 0447   LDLCALC 154 (H) 04/04/2018 1613   HgbA1c:  Lab Results  Component Value Date   HGBA1C 7.5 (H) 04/13/2019   Urine Drug Screen:     Component Value Date/Time   LABOPIA NONE DETECTED 07/22/2017 2048   COCAINSCRNUR NONE DETECTED 07/22/2017 2048   LABBENZ NONE DETECTED 07/22/2017 2048   AMPHETMU NONE DETECTED 07/22/2017 2048   THCU NONE DETECTED 07/22/2017 2048   LABBARB NONE DETECTED 07/22/2017 2048    Alcohol Level     Component Value Date/Time   ETH <5 10/07/2016 2033    IMAGING  CT ANGIO HEAD W OR WO CONTRAST 04/12/2019 IMPRESSION:  1. Minimal atherosclerotic calcifications at the left carotid bifurcation without significant  stenosis.  2. No significant stenosis in the neck.  3. Aberrant right subclavian artery, a normal variant.  4. Diffuse distal small vessel disease without a significant proximal stenosis, aneurysm, or branch vessel occlusion within the Circle of Willis.   DG Abd 1 View 04/12/2019 IMPRESSION:  Tip of the NG tube over the distal stomach.   CT HEAD WO CONTRAST 04/12/2019 IMPRESSION:  Areas of low-density in the right cerebellar hemisphere concerning for acute to subacute infarcts. Chronic appearing bilateral basal ganglia lacunar infarcts.   MR BRAIN WO CONTRAST 04/14/2019 IMPRESSION:  1. Severely motion degraded examination.  2. Widespread multifocal acute ischemia within both cerebral and cerebellar hemispheres.  3. No midline shift or other mass effect.    Transthoracic Echocardiogram  04/11/2019 IMPRESSIONS  1. Left ventricular ejection fraction, by visual estimation, is 50 to 55%. The left ventricle has normal function. There is no left ventricular hypertrophy.  2. Left ventricular diastolic function could not be evaluated.  3. The left ventricle has no regional wall motion abnormalities.  4. Global right ventricle has normal systolic function.The right ventricular size is normal.  5. Left atrial size was normal.  6. Right atrial size was normal.  7. The mitral valve is normal in structure. Trivial mitral valve regurgitation. No evidence of mitral stenosis.  8. The tricuspid valve is normal in structure.  9. The aortic valve is tricuspid. Aortic valve regurgitation is not visualized. Mild aortic valve sclerosis without stenosis. 10. The pulmonic valve was normal in structure. Pulmonic valve regurgitation is not visualized. 11. The inferior vena cava is normal in size with greater than 50% respiratory variability, suggesting right atrial pressure of 3 mmHg. 12. HR 130-144 at time of study; normal LV function; trace MR and TR; catheter in right atrium.   ECG - SB rate 46 BPM.  (See cardiology reading for complete details)   PHYSICAL EXAM  Temp:  [96 F (35.6 C)-97.8 F (36.6 C)] 97.1 F (36.2 C) (12/26 1415) Pulse Rate:  [50-83] 65 (12/26 1422) Resp:  [10-23] 14 (12/26 1422) BP: (98-165)/(56-123) 105/60 (12/26 1422) SpO2:  [88 %-100 %] 92 % (12/26 1422) Weight:  [60.2 kg] 60.2 kg (12/26 0500)  General - Well nourished, well developed, fetal position, moaning intermittently.  Ophthalmologic - fundi not visualized due to noncooperation.  Cardiovascular - irregularly irregular heart rate and rhythm.  Neuro - lethargic, eyes barely open with voice or pain. Not following any commends, moaning intermittently, no language output. PERRL, eyes mid position, no lateral gaze on commands. Not blinking to visual threat bilaterally. Not tracking. No significant facial droop. Tongue protrusion not cooperative. RUE strong against gravity. LUE also against gravity but weaker than the right. BLEs mild withdraw to pain stimulation. No babinski. Sensation, coordination and gait not tested.   ASSESSMENT/PLAN Ms. Andrea Glenn is a 58 y.o. female with history of diabetes, hypertension, emphysema, CKD, atrial fibrillation and tobacco use admitted with worsening edema, fevers, cough was found to be positive for COVID-19 with subsequent decreased responsiveness. She did not receive IV t-PA due to anticoagulation.  Stroke: multiple bilateral anterior and posterior infarcts - embolic likely due to new diagnosis atrial fibrillation not on AC  CT head - Areas of low-density in the right cerebellar hemisphere concerning for acute to subacute infarcts. Chronic appearing bilateral basal ganglia lacunar infarcts.   MRI head -  Widespread multifocal acute ischemia within both cerebral and cerebellar hemispheres.  Severely motion degraded examination.   CTA H&N - No significant stenosis. Diffuse small vessel disease.  2D Echo - EF 50 to 55%.  No cardiac source of emboli identified.    LDL - 69   HGBA1C - 7.5  VTE prophylaxis - heparin IV for CRRT  No antithrombotic prior to admission, now on aspirin 81 mg daily and heparin IV for CRRT  Patient will be counseled to be compliant with her antithrombotic medications  Ongoing aggressive stroke risk factor management  Therapy recommendations:  pending  Disposition:  Pending  Palliative care on board for a meeting on 12/27  COVID pneumonia   Lacey Jensen Virus 2 - positive  On O2  CCM on board  completed remdesivir  on decadron  zinc, vit C, vit D  On Precedex  A. fib, new diagnosis  Cardiology on board  On metoprolol  Converted to sinus this morning, however still has A. fib on my examination  Recommend AC for stroke prevention if plan for aggressive care  AKI on CKD  Creatinine 6.9-4.19-3.55-2.93-2.20  On CRRT  Nephrology on board  Anemia  Hemoglobin 8.6-7.9-7.4-6.8  Receiving PRBC transfusion  Continue heparin IV for CRRT  Close monitoring CBC  Leukocytosis   Afebrile  WBC 16.0-15.3-17.7-22.3  CCM on board  No antibiotics so far  Close monitoring CBC  dysphagia  Did not pass swallow  On tube feeding @ 40  Speech on board  Hypertension  Home BP meds: Norvasc ; hydralazine  Current BP meds: metoprolol  Stable . Gradually normalize in 3-5 days  . Long-term BP goal normotensive  Diabetes  Home diabetic meds: Dulaglutide  Current diabetic meds: Lantus  HgbA1c 7.5, goal < 7.0  SSI  CBG monitoring  Tobacco abuse  Current smoker  Smoking cessation counseling will be provided  Other Stroke Risk Factors  Cigarette smoker - will be advised to stop smoking  Family hx stroke (brother)  Previous infarcts by imaging  Other Active Problems  Mild thrombocytopenia  Mild troponin elevation - Cardiology on board - likely demand ischemia  Hospital day # 6  This patient is critically ill due to cardioembolic stroke, new diagnosis of A. fib,  Covid pneumonia, AKI on CKD, anemia need blood transfusion and at significant risk of neurological worsening, death form recurrent stroke, heart failure, Covid related complications, respiratory failure, renal failure. This patient's care requires constant monitoring of vital signs, hemodynamics, respiratory and cardiac monitoring, review of multiple databases, neurological assessment, discussion with family, other specialists and medical decision making of high complexity. I spent 40 minutes of neurocritical care time in the care of this patient.  Rosalin Hawking, MD PhD Stroke Neurology 04/14/2019 7:42 PM   To contact Stroke Continuity provider, please refer to [email or web address] After hours, contact General Neurology

## 2019-04-14 NOTE — Progress Notes (Signed)
NAME:  Andrea Glenn, MRN:  [id number], DOB:  1960/09/19, LOS: 6 ADMISSION DATE:  04/13/2019, CONSULTATION DATE:  04/12/2019 REFERRING MD: Dolores Hoose CHIEF COMPLAINT:  Hypoxia, AMS    Brief History   58 yo female presented to ER with leg swelling and pain.  Found to have4 COVID 19 pneumonia.  Has hx of nephrotic syndrome with FSGS and DM nephropathy, CKD 4.  She was previously treated with prograf, and last received rituximab in October 2020.  Found to have worsening renal fx.  Had fever 101.8.  required supplemental oxygen.  U/A suggestive of UTI.  Found to have elevated troponin and a fib with RVR.  Had HD catheter placed with plan for iHD.  Had worsening mental status.  CT head suggestive of acute CVA.  She was transferred to ICU.  Past Medical History  A fib, CKD 4, DM, DM nephropathy, DM neuropathy, FSGS, Emphysema, Frequent falls, HLD, HTN, Pancreatitis, Shingles, RLS, Tremor  Significant Hospital Events   12/20 Admitted 12/24 transfer to ICU 12/25 covert to sinus bradycardia >> amiodarone held 12/26 start on precedex, transfuse 1 unit PRBC  Consults:  Nephrology Neurology Cardiology >> s/o 12/26  Procedures:  12/22 right IJ tunneled HD catheter  Significant Diagnostic Tests:  Echo 12/23 >> EF 50 to 55% CT head 12/24 >> low density Rt cerebellar hemisphere concerning for acute to subacute infarcts, chronic b/l BG lacunar infarcts CT angio head/neck 12/24 >> calcification Lt carotid bifurcation, diffuse small vessel disease MRI brain 12/25 >> widespread multifocal acute ischemia within both cerebral and cerebellar hemispheres  Micro Data:  12/20 SARS Cov2 >> positive 12/20 Blood >> Bacillus  12/20 Urine >> Klebsiella-ampicillin resistant, nitrofurantoin intermediate 12/24 Blood >>  COVID 19 treatment:  Remdesivir 12/20 >>12/24 Dexamethasone 12/20 >>  Antimicrobials/COVID19 treatment  Azithromycin 12/19 x 1 dose Ceftriaxone 12/19>12/21, 12/23>12/24 Cefazolin 12/22  SCIP Cefepime 12/24 >> 12/25 Vancomycin 12/24 >> 12/25  Interim history/subjective:  Down to 4 liters.  Started on precedex overnight.  Remains on CRRT.  Objective   Blood pressure 133/68, pulse 72, temperature 97.8 F (36.6 C), temperature source Oral, resp. rate 13, height 5\' 4"  (1.626 m), weight 60.2 kg, SpO2 93 %.        Intake/Output Summary (Last 24 hours) at 04/14/2019 0900 Last data filed at 04/14/2019 0900 Gross per 24 hour  Intake 1272.21 ml  Output 3074 ml  Net -1801.79 ml   Filed Weights   04/11/19 0228 04/13/19 0500 04/14/19 0500  Weight: 62.5 kg 61.8 kg 60.2 kg    Examination:  General - somnolent Eyes - pupils reactive ENT - no sinus tenderness, no stridor Cardiac - regular rate/rhythm, no murmur Chest - scattered rhonchi Abdomen - soft, non tender, + bowel sounds Extremities - no cyanosis, clubbing, or edema Skin - no rashes Neuro - moans intermittently, doesn't follow commands  Resolved Hospital Problem list   Klebsiella UTI  Assessment & Plan:   Acute hypoxic respiratory failure from COVID 19 pneumonia. - goal SpO2 85 to 95% - completed remdesivir - day 7/10 of decadron - zinc, vit C, vit D  Acute metabolic encephalopathy from hypoxia, renal failure, COVID 19 pneumonia, and UTI. Multifocal acute CVA >> likely embolic. - stroke team to assess - wean precedex for RASS goa 0  New onset A fib with RVR >> converted to sinus bradycardia 12/25. Elevated troponin from demand ischemia. - monitor hemodynamics  AKI from ATN. CKD 4 with FSGS and DM nephropathy. Metabolic acidosis. - hopefully can transition  to iHD soon  Poorly controlled DM type 2 with steroid induced hyperglycemia. - SSI with lantus  Anemia of critical illness and chronic disease. - f/u CBC after transfusion 12/26 >> no family available for consent; will give under emergency consent - transfuse for Hb < 7 or significant bleeding  Goals of care. - uncertain who her  medical decision maker is; unable to reach listed contact relative  Best practice:  Diet: tube feeds DVT prophylaxis: SCDs GI prophylaxis: protonix Mobility: Bedrest Code Status: Full Disposition: ICU  Labs    CMP Latest Ref Rng & Units 04/14/2019 04/13/2019 04/13/2019  Glucose 70 - 99 mg/dL 204(H) 134(H) 99  BUN 6 - 20 mg/dL 39(H) 46(H) 55(H)  Creatinine 0.44 - 1.00 mg/dL 2.20(H) 2.93(H) 3.55(H)  Sodium 135 - 145 mmol/L 139 140 141  Potassium 3.5 - 5.1 mmol/L 3.9 4.3 4.7  Chloride 98 - 111 mmol/L 104 108 106  CO2 22 - 32 mmol/L 22 21(L) 21(L)  Calcium 8.9 - 10.3 mg/dL 7.1(L) 6.7(L) 7.1(L)  Total Protein 6.5 - 8.1 g/dL - - -  Total Bilirubin 0.3 - 1.2 mg/dL - - -  Alkaline Phos 38 - 126 U/L - - -  AST 15 - 41 U/L - - -  ALT 0 - 44 U/L - - -    CBC Latest Ref Rng & Units 04/14/2019 04/13/2019 04/13/2019  WBC 4.0 - 10.5 K/uL 22.3(H) 17.7(H) 15.3(H)  Hemoglobin 12.0 - 15.0 g/dL 6.8(LL) 7.4(L) 7.3(L)  Hematocrit 36.0 - 46.0 % 19.9(L) 21.8(L) 20.7(L)  Platelets 150 - 400 K/uL 121(L) 120(L) 129(L)    ABG    Component Value Date/Time   PHART 7.246 (L) 04/12/2019 0744   PCO2ART 32.5 04/12/2019 0744   PO2ART 64.7 (L) 04/12/2019 0744   HCO3 14.0 (L) 04/12/2019 0744   TCO2 28 10/21/2009 2057   ACIDBASEDEF 12.2 (H) 04/12/2019 0744   O2SAT 90.2 04/12/2019 0744    CBG (last 3)  Recent Labs    04/14/19 0121 04/14/19 0354 04/14/19 0806  GLUCAP 191* 197* 132*    CC time 33 minutes  Chesley Mires, MD [facility] 04/14/2019, 9:00 AM

## 2019-04-14 NOTE — Progress Notes (Signed)
CRITICAL VALUE ALERT  Critical Value:  Hgb 6.8  Date & Time Notied: 04/14/19 0414  Provider Notified: Warren Lacy

## 2019-04-14 NOTE — Progress Notes (Addendum)
Update: Chairann called CSW back, CSW notified Haynes Dage who is now in contact with her regarding [place].     CSW informed of difficulty reaching family in chart. CSW has attempted contact listed, no answer and no vm set up.   CSW has made call to [facility], wellfare check to be completed today at patient's address with attempts to locate family member.   CSW has called [facility] as they had worked with patient in the past, the only number they have on file is patient's personal cell number [phone]) no additional contacts or family members listed.   [place], [place]

## 2019-04-14 NOTE — Progress Notes (Signed)
Initial Nutrition Assessment  DOCUMENTATION CODES:   Not applicable  INTERVENTION:  Discontinue Vital High Protein.  Initiate Vital 1.5 formula via NGT at goal rate of 50 ml/h (1200 ml per day) and Prostat 30 ml once daily to provide 1900 kcals, 96 gm protein, 912 ml free water daily.  NUTRITION DIAGNOSIS:   Inadequate oral intake related to inability to eat as evidenced by NPO status.  GOAL:   Patient will meet greater than or equal to 90% of their needs  MONITOR:   Skin, TF tolerance, Weight trends, Labs, I & O's  REASON FOR ASSESSMENT:   Consult Enteral/tube feeding initiation and management  ASSESSMENT:   58 y.o. yo female  with history of poorly controlled DM, HTN, HLD, nephrotic syndrome with biopsy-proven FSGS and diabetic nephropathy s/p prograf and rituximab, CKD stage IV with creatinine between 3-4 at baseline, presented with worsening edema, Covid-19 positive. Pt with UTI.   Pt is currently on 4L HFNC. Pt NPO. NGT placed 12/24 for enteral nutrition initiation. RD to modify tube feeding to better meet nutrition needs. Pt with AKI on CKD4 with fluid overload. IJ HD cath placed 12/22. Pt on CRRT. Pt encephalopathic. Per MD, hopeful to transition to [facility] after mental status improves on CRRT. Per MD pt with likely need long term HD.  Unable to complete Nutrition-Focused physical exam at this time.   Labs and medications reviewed.  Vitamin C, Vitamin D, zinc ordered.   Diet Order:   Diet Order            Diet NPO time specified  Diet effective now              EDUCATION NEEDS:   Not appropriate for education at this time  Skin:  Skin Assessment: Reviewed RN Assessment  Last BM:  12/26  Height:   Ht Readings from Last 1 Encounters:  04/08/19 5\' 4"  (1.626 m)    Weight:   Wt Readings from Last 1 Encounters:  04/14/19 60.2 kg    Ideal Body Weight:  54.5 kg  BMI:  Body mass index is 22.78 kg/m.  Estimated Nutritional Needs:   Kcal:   1900-2100  Protein:  95-115 grams  Fluid:  UOP +1L    Corrin Parker, MS, RD, LDN Pager # [phone] After hours/ weekend pager # [phone]

## 2019-04-14 NOTE — Progress Notes (Signed)
eLink Physician-Brief Progress Note Patient Name: Andrea Glenn DOB: 1960-05-14 MRN: [id number]   Date of Service  04/14/2019  HPI/Events of Note  Hemoglobin 6.8 gm  eICU Interventions  Transfuse 1 unit PRBC        [name] 04/14/2019, 4:34 AM

## 2019-04-14 NOTE — Progress Notes (Signed)
Pt unable to give consent to blood, attempted to call family member listed in chart multiple times with no answer and no voicemail set up.  MD made aware and ordered to give blood emergently

## 2019-04-14 NOTE — Progress Notes (Signed)
FPTS Social Note  FPTS appreciates excellent care provided by CCM for this patient, as well as numerous consulting physicians.  We will continue to follow socially and be ready to take patient back when she is stable for the floor.   Milus Banister, [place], PGY-2 04/14/2019 7:10 AM

## 2019-04-14 NOTE — Progress Notes (Signed)
eLink Physician-Brief Progress Note Patient Name: Andrea Glenn DOB: 12/01/1960 MRN: [id number]   Date of Service  04/14/2019  HPI/Events of Note  Pt with persistent agitated delirium despite Haldol given earlier  eICU Interventions  Low level Precedex infusion ordered.        Kerry Kass Ogan 04/14/2019, 2:11 AM

## 2019-04-14 NOTE — Progress Notes (Signed)
Patient transported to MRI and back. 

## 2019-04-14 NOTE — Progress Notes (Signed)
Patient's sister Andrea Glenn called this RN explaining that grandson has autism and to call the patients daughter Andrea Glenn for updates being that she is the next of kin.  This RN called Monique for an update and answered all questions she had, she also gave permission to update sister, Hilda Blades as well.  Both Monique and Debra's numbers have been added to the chart

## 2019-04-14 NOTE — Progress Notes (Addendum)
Epic chart reviewed.  Attempted to call contact listed on face sheet, Chairann Mcqueen, but no answer and no voicemail set up.  Dialed patient's home number and left message just in case anyone was there.  Paged Resident to determine if they have any other contacts.   Family Medicine Team is her PCP.  IM chatted with CSW to determine methods of finding family members.  A wellness check by [facility] is being considered.  Palliative Medicine Team is unable to move forward with goals of care for this very sick individual without family or pertinent contacts people.  Please let us know when a contact for goals of care is found.  Greatly appreciate CSW efforts.  Florentina Jenny, PA-C Palliative Medicine Office:  [phone]   No charge note.

## 2019-04-14 NOTE — Progress Notes (Signed)
Greatly appreciate CSW who was able to contact [place] the patient's grandson at the number listed on the face sheet.  I spoke with Chairann and explained that his grandmother was here with COVID and an irregular heart rhythm.  She subsequently had progression of her CKD and is on hemodialysis and has had a stroke.   [place] son was quite surprised.  We made a plan to gather the pertinent family members for a conference call on 12/27 at 12:00.  PMT to speak with family about [place] on 12/27 at 12:00 noon.  Florentina Jenny, PA-C Palliative Medicine Office:  [phone]  No charge note.

## 2019-04-14 NOTE — Progress Notes (Signed)
eLink Physician-Brief Progress Note Patient Name: Andrea Glenn DOB: June 07, 1960 MRN: [id number]   Date of Service  04/14/2019  HPI/Events of Note  Pt vomited enteral feeding and the bedside RN held enteral nutrition and put her Ng tube on LIS, Pt also has some delirium with intermittent moaning. Heart rate dropped on Precedex., I agree with NG tube to LIS.  eICU Interventions  Will treat Delirium with 0.5 mg haldol iv Q 4 hours prn        Jozalyn Baglio U Taysha Majewski 04/14/2019, 8:32 PM

## 2019-04-14 NOTE — Progress Notes (Signed)
Progress Note  Patient Name: Andrea Glenn Date of Encounter: 04/14/2019  Primary Cardiologist:   No primary care provider on file.   Subjective   Only moans.  No purposeful interaction.   Inpatient Medications    Scheduled Meds: . sodium chloride   Intravenous Once  . ascorbic acid  100 mg Per Tube Daily  . aspirin  81 mg Oral Daily  . chlorhexidine  15 mL Mouth Rinse BID  . Chlorhexidine Gluconate Cloth  6 each Topical Q0600  . darbepoetin (ARANESP) injection - DIALYSIS  100 mcg Intravenous Q Thu-HD  . dexamethasone (DECADRON) injection  6 mg Intravenous Q24H  . feeding supplement (PRO-STAT SUGAR FREE 64)  30 mL Per Tube BID  . feeding supplement (VITAL HIGH PROTEIN)  1,000 mL Per Tube Q24H  . insulin aspart  0-9 Units Subcutaneous Q4H  . insulin glargine  10 Units Subcutaneous QHS  . mouth rinse  15 mL Mouth Rinse q12n4p  . metoprolol tartrate  2.5 mg Intravenous Q6H  . pantoprazole sodium  40 mg Per Tube Q24H  . sodium chloride flush  3 mL Intravenous Q12H  . zinc sulfate  220 mg Per Tube Daily   Continuous Infusions: .  prismasol BGK 4/2.5 600 mL/hr at 04/14/19 0118  .  prismasol BGK 4/2.5 300 mL/hr at 04/13/19 1256  . sodium chloride Stopped (04/13/19 1056)  . sodium chloride    . dexmedetomidine (PRECEDEX) IV infusion 0.4 mcg/kg/hr (04/14/19 0800)  . heparin 10,000 units/ 20 mL infusion syringe 750 Units/hr (04/14/19 0700)  . prismasol BGK 4/2.5 1,500 mL/hr at 04/14/19 0500   PRN Meds: sodium chloride, sodium chloride, acetaminophen, alteplase, heparin, heparin, heparin, heparin, Ipratropium-Albuterol, lidocaine (PF), lidocaine-prilocaine, ondansetron (ZOFRAN) IV, pentafluoroprop-tetrafluoroeth, polyethylene glycol, sodium chloride   Vital Signs    Vitals:   04/14/19 0715 04/14/19 0728 04/14/19 0730 04/14/19 0745  BP:      Pulse: 62 (!) 59 (!) 59 65  Resp: 12 16 14 14   Temp:      TempSrc:      SpO2: 98% 100% 99% (!) 88%  Weight:      Height:         Intake/Output Summary (Last 24 hours) at 04/14/2019 0812 Last data filed at 04/14/2019 0800 Gross per 24 hour  Intake 1266.02 ml  Output 3028 ml  Net -1761.98 ml   Filed Weights   04/11/19 0228 04/13/19 0500 04/14/19 0500  Weight: 62.5 kg 61.8 kg 60.2 kg    Telemetry    NSR- Personally Reviewed  ECG    NA - Personally Reviewed  Physical Exam   GEN:   Acutely ill Neck: No  JVD Cardiac: RRR, no murmurs, rubs, or gallops.  Respiratory:    Decreased breath sounds GI:    Decreased bowel sounds MS:  No edema; No deformity. Neuro:   Moves all extremities.     Labs    Chemistry Recent Labs  Lab 04/11/19 0451 04/12/19 0710 04/13/19 0447 04/13/19 0848 04/13/19 1518 04/14/19 0335  NA 142 141 139 141 140 139  K 4.5 5.0 4.3 4.7 4.3 3.9  CL 107 106 107 106 108 104  CO2 15* 15* 19* 21* 21* 22  GLUCOSE 172* 275* 111* 99 134* 204*  BUN 70* 95* 65* 55* 46* 39*  CREATININE 5.74* 6.90* 4.19* 3.55* 2.93* 2.20*  CALCIUM 7.4* 7.3* 7.0* 7.1* 6.7* 7.1*  PROT 5.2* 5.0* 4.7*  --   --   --   ALBUMIN 1.5* 1.2* 1.3*  1.3* 1.3* 1.4*  AST 93* 66* 57*  --   --   --   ALT 20 17 17   --   --   --   ALKPHOS 59 64 80  --   --   --   BILITOT 0.7 0.4 0.9  --   --   --   GFRNONAA 7* 6* 11* 13* 17* 24*  GFRAA 9* 7* 13* 15* 19* 28*  ANIONGAP 20* 20* 13 14 11 13      Hematology Recent Labs  Lab 04/13/19 0447 04/13/19 0848 04/14/19 0335  WBC 15.3* 17.7* 22.3*  RBC 2.65* 2.76* 2.55*  HGB 7.3* 7.4* 6.8*  HCT 20.7* 21.8* 19.9*  MCV 78.1* 79.0* 78.0*  MCH 27.5 26.8 26.7  MCHC 35.3 33.9 34.2  RDW 17.2* 17.5* 17.4*  PLT 129* 120* 121*    Cardiac EnzymesNo results for input(s): TROPONINI in the last 168 hours. No results for input(s): TROPIPOC in the last 168 hours.   BNP Recent Labs  Lab 04/08/19 0422  BNP 1,163.8*     DDimer  Recent Labs  Lab 04/11/19 0451 04/12/19 0710 04/13/19 0447  DDIMER 7.83* 5.50* 7.78*     Radiology    CT ANGIO HEAD W OR WO  CONTRAST  Result Date: 04/12/2019 CLINICAL DATA:  Stroke, follow-up. Decreased responsiveness. COVID-19 pneumonia. EXAM: CT ANGIOGRAPHY HEAD AND NECK TECHNIQUE: Multidetector CT imaging of the head and neck was performed using the standard protocol during bolus administration of intravenous contrast. Multiplanar CT image reconstructions and MIPs were obtained to evaluate the vascular anatomy. Carotid stenosis measurements (when applicable) are obtained utilizing NASCET criteria, using the distal internal carotid diameter as the denominator. CONTRAST:  134mL OMNIPAQUE IOHEXOL 350 MG/ML SOLN COMPARISON:  CT head without contrast 04/12/2019 FINDINGS: CTA NECK FINDINGS Aortic arch: A 3 vessel arch configuration is present. Scratched at there is a common origin of the left common carotid artery in the innominate artery. No significant vascular calcifications are present. There is no stenosis or aneurysm. Aberrant right subclavian artery is present, a normal variant. Right carotid system: The right common carotid artery is within normal limits. Bifurcation is unremarkable. Cervical right ICA is normal. Left carotid system: The left common carotid artery is within normal limits. Minimal calcifications present at the bifurcation without a significant stenosis relative to the more distal vessel. The cervical left ICA is normal. Vertebral arteries: The left vertebral artery is the dominant vessel. Both vertebral arteries originate from the subclavian arteries without significant stenosis. There is no significant stenosis in either vertebral artery in the neck. Skeleton: Mild endplate degenerative changes are present C5-6 and C6-7. Vertebral body heights alignment are normal. The patient is edentulous. No focal lytic or blastic lesions are present. Other neck: Soft tissues of the neck are otherwise unremarkable. Salivary glands are normal. Thyroid is normal. Upper chest: Extensive bilateral airspace disease is present  consistent with COVID-19 pneumonia Review of the MIP images confirms the above findings CTA HEAD FINDINGS Anterior circulation: Atherosclerotic changes are present within the cavernous internal carotid arteries bilaterally. There is no significant stenosis through the ICA terminus. The A1 and M1 segments are normal. Anterior communicating artery is patent. MCA bifurcations are intact. Diffuse distal small vessel attenuation is present without a significant proximal stenosis or occlusion. There is no aneurysm. Posterior circulation: The left vertebral artery is the dominant vessel. PICA origins are visualized and normal. Basilar artery is normal. Both posterior cerebral arteries originate from basilar tip. PCA branch vessels are within  normal limits proximally. There is moderate attenuation of distal branch vessels. Venous sinuses: The dural sinuses are patent. The straight sinus deep cerebral veins are intact. Cortical veins are unremarkable. No vascular malformation is evident. Anatomic variants: None Review of the MIP images confirms the above findings IMPRESSION: 1. Minimal atherosclerotic calcifications at the left carotid bifurcation without significant stenosis. 2. No significant stenosis in the neck. 3. Aberrant right subclavian artery, a normal variant. 4. Diffuse distal small vessel disease without a significant proximal stenosis, aneurysm, or branch vessel occlusion within the Circle of Willis. Electronically Signed   By: San Morelle M.D.   On: 04/12/2019 16:58   DG Abd 1 View  Result Date: 04/12/2019 CLINICAL DATA:  NG tube placement EXAM: ABDOMEN - 1 VIEW COMPARISON:  July 22, 2017 FINDINGS: Tip the NG tube is seen projecting over the distal stomach. The bowel gas pattern is normal. No radio-opaque calculi or other significant radiographic abnormality are seen. Patchy airspace opacity seen at both lung bases. IMPRESSION: Tip of the NG tube over the distal stomach. Electronically Signed    By: Prudencio Pair M.D.   On: 04/12/2019 20:37   CT HEAD WO CONTRAST  Result Date: 04/12/2019 CLINICAL DATA:  Altered mental status. EXAM: CT HEAD WITHOUT CONTRAST TECHNIQUE: Contiguous axial images were obtained from the base of the skull through the vertex without intravenous contrast. COMPARISON:  07/22/2017 FINDINGS: Brain: Chronic appearing bilateral basal ganglia lacunar infarcts. Low-density noted in the right cerebellar hemisphere compatible with acute to subacute infarction. No hemorrhage or hydrocephalus. Vascular: No hyperdense vessel or unexpected calcification. Skull: No acute calvarial abnormality. Sinuses/Orbits: Visualized paranasal sinuses and mastoids clear. Orbital soft tissues unremarkable. Other: None IMPRESSION: Areas of low-density in the right cerebellar hemisphere concerning for acute to subacute infarcts. Chronic appearing bilateral basal ganglia lacunar infarcts. Electronically Signed   By: Rolm Baptise M.D.   On: 04/12/2019 10:41   MR BRAIN WO CONTRAST  Result Date: 04/14/2019 CLINICAL DATA:  Stroke follow-up. COVID-19. EXAM: MRI HEAD WITHOUT CONTRAST TECHNIQUE: Multiplanar, multiecho pulse sequences of the brain and surrounding structures were obtained without intravenous contrast. COMPARISON:  Head CT 04/12/2019 FINDINGS: Examination is severely degraded by motion. Additionally. An abbreviated scan protocol was used. Five series are provided for interpretation. Widespread multifocal acute ischemia within both cerebral and cerebellar hemispheres. The largest confluent area is in the right cerebellum. There is no midline shift or other mass effect IMPRESSION: 1. Severely motion degraded examination. 2. Widespread multifocal acute ischemia within both cerebral and cerebellar hemispheres. 3. No midline shift or other mass effect. Electronically Signed   By: Ulyses Jarred M.D.   On: 04/14/2019 00:37   DG CHEST PORT 1 VIEW  Result Date: 04/12/2019 CLINICAL DATA:  Sepsis EXAM:  PORTABLE CHEST 1 VIEW COMPARISON:  04/10/2011 FINDINGS: Diffuse airspace disease throughout the lungs. No significant change since prior study. No effusions. Heart is normal size. Interval placement of right dialysis catheter with the tip in the lower right atrium. No pneumothorax. IMPRESSION: Interval placement of right dialysis catheter. The tip is in the lower right atrium. No pneumothorax. Severe diffuse bilateral airspace disease, unchanged. Electronically Signed   By: Rolm Baptise M.D.   On: 04/12/2019 10:11    Cardiac Studies   Echo   1. Left ventricular ejection fraction, by visual estimation, is 50 to 55%. The left ventricle has normal function. There is no left ventricular hypertrophy. 2. Left ventricular diastolic function could not be evaluated. 3. The left ventricle has no  regional wall motion abnormalities. 4. Global right ventricle has normal systolic function.The right ventricular size is normal. 5. Left atrial size was normal. 6. Right atrial size was normal. 7. The mitral valve is normal in structure. Trivial mitral valve regurgitation. No evidence of mitral stenosis. 8. The tricuspid valve is normal in structure. 9. The aortic valve is tricuspid. Aortic valve regurgitation is not visualized. Mild aortic valve sclerosis without stenosis. 10. The pulmonic valve was normal in structure. Pulmonic valve regurgitation is not visualized. 11. The inferior vena cava is normal in size with greater than 50% respiratory variability, suggesting right atrial pressure of 3 mmHg. 12. HR 130-144 at time of study; normal LV function; trace MR and TR; catheter in right atrium.  Patient Profile     58 y.o. female  with a hx of HTN, HLD, poorly controled DM with neurophathy, FSGS with nephrotic syndrome, tobacco smoking and CKD IV who is being seen for the evaluation of elevated troponin at the request of Dr. Nori Riis.   She has COVID 19.    Assessment & Plan    ELEVATED TROPONIN:     Demand  ischemia.  No further work up.   ACUTE ON CHRONIC RENAL INSUFFICIENCY:  Was getting dialysis.   Given hemodynamics she will be on CRRT.    ACUTE RESPIRATORY FAILURE:    Per primary team CCM.    ATRIAL FIB:   Converted to NSR.  Sinus brady.  Amiodarone was stopped.  No further management.  AMS:  Evaluated by neurology.    MRI with widespread multifocal acute ischemia.    Cardiology will see as needed.    For questions or updates, please contact [place] Please consult [email or web address] for contact info under Cardiology/STEMI.   Signed, Minus Breeding, MD  04/14/2019, 8:12 AM

## 2019-04-15 ENCOUNTER — Inpatient Hospital Stay (HOSPITAL_COMMUNITY): Payer: Medicaid Other

## 2019-04-15 DIAGNOSIS — R58 Hemorrhage, not elsewhere classified: Secondary | ICD-10-CM

## 2019-04-15 DIAGNOSIS — Z515 Encounter for palliative care: Secondary | ICD-10-CM

## 2019-04-15 DIAGNOSIS — J1289 Other viral pneumonia: Secondary | ICD-10-CM

## 2019-04-15 DIAGNOSIS — N184 Chronic kidney disease, stage 4 (severe): Secondary | ICD-10-CM

## 2019-04-15 DIAGNOSIS — N170 Acute kidney failure with tubular necrosis: Secondary | ICD-10-CM

## 2019-04-15 DIAGNOSIS — U071 COVID-19: Secondary | ICD-10-CM

## 2019-04-15 LAB — POCT I-STAT 7, (LYTES, BLD GAS, ICA,H+H)
Acid-base deficit: 1 mmol/L (ref 0.0–2.0)
Bicarbonate: 24.8 mmol/L (ref 20.0–28.0)
Calcium, Ion: 1.05 mmol/L — ABNORMAL LOW (ref 1.15–1.40)
HCT: 18 % — ABNORMAL LOW (ref 36.0–46.0)
Hemoglobin: 6.1 g/dL — CL (ref 12.0–15.0)
O2 Saturation: 100 %
Patient temperature: 97.5
Potassium: 4 mmol/L (ref 3.5–5.1)
Sodium: 139 mmol/L (ref 135–145)
TCO2: 26 mmol/L (ref 22–32)
pCO2 arterial: 45.1 mmHg (ref 32.0–48.0)
pH, Arterial: 7.345 — ABNORMAL LOW (ref 7.350–7.450)
pO2, Arterial: 537 mmHg — ABNORMAL HIGH (ref 83.0–108.0)

## 2019-04-15 LAB — CBC
HCT: 11.3 % — ABNORMAL LOW (ref 36.0–46.0)
Hemoglobin: 3.8 g/dL — CL (ref 12.0–15.0)
MCH: 27.7 pg (ref 26.0–34.0)
MCHC: 33.6 g/dL (ref 30.0–36.0)
MCV: 82.5 fL (ref 80.0–100.0)
Platelets: 96 K/uL — ABNORMAL LOW (ref 150–400)
RBC: 1.37 MIL/uL — ABNORMAL LOW (ref 3.87–5.11)
RDW: 16.8 % — ABNORMAL HIGH (ref 11.5–15.5)
WBC: 16.1 K/uL — ABNORMAL HIGH (ref 4.0–10.5)
nRBC: 3.8 % — ABNORMAL HIGH (ref 0.0–0.2)

## 2019-04-15 LAB — POCT ACTIVATED CLOTTING TIME
Activated Clotting Time: 219 s
Activated Clotting Time: 219 seconds
Activated Clotting Time: 219 seconds
Activated Clotting Time: 224 seconds
Activated Clotting Time: 230 s
Activated Clotting Time: 230 seconds

## 2019-04-15 LAB — GLUCOSE, CAPILLARY
Glucose-Capillary: 111 mg/dL — ABNORMAL HIGH (ref 70–99)
Glucose-Capillary: 180 mg/dL — ABNORMAL HIGH (ref 70–99)
Glucose-Capillary: 191 mg/dL — ABNORMAL HIGH (ref 70–99)

## 2019-04-15 LAB — FIBRINOGEN: Fibrinogen: 160 mg/dL — ABNORMAL LOW (ref 210–475)

## 2019-04-15 LAB — RENAL FUNCTION PANEL
Albumin: 2.3 g/dL — ABNORMAL LOW (ref 3.5–5.0)
Anion gap: 13 (ref 5–15)
BUN: 17 mg/dL (ref 6–20)
CO2: 20 mmol/L — ABNORMAL LOW (ref 22–32)
Calcium: 7.3 mg/dL — ABNORMAL LOW (ref 8.9–10.3)
Chloride: 106 mmol/L (ref 98–111)
Creatinine, Ser: 1.18 mg/dL — ABNORMAL HIGH (ref 0.44–1.00)
GFR calc Af Amer: 58 mL/min — ABNORMAL LOW (ref >60)
GFR calc non Af Amer: 50 mL/min — ABNORMAL LOW (ref >60)
Glucose, Bld: 112 mg/dL — ABNORMAL HIGH (ref 70–99)
Phosphorus: 2.3 mg/dL — ABNORMAL LOW (ref 2.5–4.6)
Potassium: 4.7 mmol/L (ref 3.5–5.1)
Sodium: 139 mmol/L (ref 135–145)

## 2019-04-15 LAB — APTT
aPTT: >200 seconds (ref 24–36)
aPTT: >200 seconds (ref 24–36)
aPTT: >200 seconds (ref 24–36)

## 2019-04-15 LAB — MAGNESIUM: Magnesium: 2.3 mg/dL (ref 1.7–2.4)

## 2019-04-15 LAB — LACTIC ACID, PLASMA: Lactic Acid, Venous: 6.8 mmol/L (ref 0.5–1.9)

## 2019-04-15 LAB — PREPARE RBC (CROSSMATCH)

## 2019-04-15 LAB — PROTIME-INR
INR: 2 — ABNORMAL HIGH (ref 0.8–1.2)
Prothrombin Time: 22.7 seconds — ABNORMAL HIGH (ref 11.4–15.2)

## 2019-04-15 MED ORDER — SODIUM CHLORIDE 0.9 % IV SOLN: INTRAVENOUS | Status: DC | PRN

## 2019-04-15 MED ORDER — LORAZEPAM 2 MG/ML IJ SOLN: 2.0000 mg | Freq: Once | INTRAMUSCULAR | Status: DC

## 2019-04-15 MED ORDER — GLYCOPYRROLATE 1 MG PO TABS: 1.0000 mg | ORAL_TABLET | ORAL | Status: DC | PRN

## 2019-04-15 MED ORDER — ONDANSETRON 4 MG PO TBDP: 4.0000 mg | ORAL_TABLET | Freq: Four times a day (QID) | ORAL | Status: DC | PRN

## 2019-04-15 MED ORDER — MIDAZOLAM HCL 2 MG/2ML IJ SOLN
INTRAMUSCULAR | Status: AC
  Filled 2019-04-15: qty 2

## 2019-04-15 MED ORDER — HALOPERIDOL LACTATE 2 MG/ML PO CONC
0.5000 mg | ORAL | Status: DC | PRN
  Filled 2019-04-15: qty 0.3

## 2019-04-15 MED ORDER — MIDAZOLAM HCL 2 MG/2ML IJ SOLN: 2.0000 mg | INTRAMUSCULAR | Status: DC | PRN

## 2019-04-15 MED ORDER — ROCURONIUM BROMIDE 50 MG/5ML IV SOLN
80.0000 mg | Freq: Once | INTRAVENOUS | Status: AC
  Administered 2019-04-15: 80 mg via INTRAVENOUS

## 2019-04-15 MED ORDER — ETOMIDATE 2 MG/ML IV SOLN
30.0000 mg | Freq: Once | INTRAVENOUS | Status: AC
  Administered 2019-04-15: 30 mg via INTRAVENOUS

## 2019-04-15 MED ORDER — PHENYLEPHRINE HCL-NACL 10-0.9 MG/250ML-% IV SOLN
0.0000 ug/min | INTRAVENOUS | Status: DC
  Administered 2019-04-15: 20 ug/min via INTRAVENOUS
  Administered 2019-04-15: 30 ug/min via INTRAVENOUS
  Filled 2019-04-15: qty 500
  Filled 2019-04-15: qty 250

## 2019-04-15 MED ORDER — ALBUMIN HUMAN 5 % IV SOLN
25.0000 g | Freq: Once | INTRAVENOUS | Status: AC
  Administered 2019-04-15: 25 g via INTRAVENOUS
  Filled 2019-04-15: qty 1000
  Filled 2019-04-15: qty 250

## 2019-04-15 MED ORDER — POLYVINYL ALCOHOL 1.4 % OP SOLN
1.0000 [drp] | Freq: Four times a day (QID) | OPHTHALMIC | Status: DC | PRN
  Filled 2019-04-15: qty 15

## 2019-04-15 MED ORDER — MIDAZOLAM HCL 2 MG/2ML IJ SOLN
2.0000 mg | INTRAMUSCULAR | Status: DC | PRN
  Filled 2019-04-15: qty 2

## 2019-04-15 MED ORDER — ACETAMINOPHEN 325 MG PO TABS: 650.0000 mg | ORAL_TABLET | Freq: Four times a day (QID) | ORAL | Status: DC | PRN

## 2019-04-15 MED ORDER — FENTANYL 2500MCG IN NS 250ML (10MCG/ML) PREMIX INFUSION
200.0000 ug/h | INTRAVENOUS | Status: DC
  Administered 2019-04-15: 300 ug/h via INTRAVENOUS
  Administered 2019-04-15: 200 ug/h via INTRAVENOUS
  Administered 2019-04-16 (×2): 300 ug/h via INTRAVENOUS
  Administered 2019-04-16: 400 ug/h via INTRAVENOUS
  Administered 2019-04-17 (×2): 300 ug/h via INTRAVENOUS
  Administered 2019-04-18 (×2): 275 ug/h via INTRAVENOUS
  Filled 2019-04-15 (×9): qty 250

## 2019-04-15 MED ORDER — LORAZEPAM 2 MG/ML IJ SOLN: 1.0000 mg | INTRAMUSCULAR | Status: DC | PRN

## 2019-04-15 MED ORDER — DIPHENHYDRAMINE HCL 50 MG/ML IJ SOLN: 25.0000 mg | INTRAMUSCULAR | Status: DC | PRN

## 2019-04-15 MED ORDER — CHLORHEXIDINE GLUCONATE 0.12% ORAL RINSE (MEDLINE KIT)
15.0000 mL | Freq: Two times a day (BID) | OROMUCOSAL | Status: DC
  Administered 2019-04-15: 15 mL via OROMUCOSAL

## 2019-04-15 MED ORDER — SODIUM CHLORIDE 0.9% IV SOLUTION: Freq: Once | INTRAVENOUS | Status: DC

## 2019-04-15 MED ORDER — SODIUM CHLORIDE 0.9 % IV BOLUS
250.0000 mL | Freq: Once | INTRAVENOUS | Status: AC
  Administered 2019-04-15: 250 mL via INTRAVENOUS

## 2019-04-15 MED ORDER — HALOPERIDOL LACTATE 5 MG/ML IJ SOLN: 0.5000 mg | INTRAMUSCULAR | Status: DC | PRN

## 2019-04-15 MED ORDER — ACETAMINOPHEN 500 MG PO TABS: 1000.0000 mg | ORAL_TABLET | Freq: Four times a day (QID) | ORAL | Status: DC | PRN

## 2019-04-15 MED ORDER — ORAL CARE MOUTH RINSE
15.0000 mL | OROMUCOSAL | Status: DC
  Administered 2019-04-15 (×3): 15 mL via OROMUCOSAL

## 2019-04-15 MED ORDER — FENTANYL BOLUS VIA INFUSION
100.0000 ug | INTRAVENOUS | Status: DC | PRN
  Filled 2019-04-15: qty 200

## 2019-04-15 MED ORDER — LORAZEPAM 1 MG PO TABS: 1.0000 mg | ORAL_TABLET | ORAL | Status: DC | PRN

## 2019-04-15 MED ORDER — ONDANSETRON HCL 4 MG/2ML IJ SOLN: 4.0000 mg | Freq: Four times a day (QID) | INTRAMUSCULAR | Status: DC | PRN

## 2019-04-15 MED ORDER — GLYCOPYRROLATE 0.2 MG/ML IJ SOLN: 0.2000 mg | INTRAMUSCULAR | Status: DC | PRN

## 2019-04-15 MED ORDER — CHLORHEXIDINE GLUCONATE 0.12% ORAL RINSE (MEDLINE KIT): 15.0000 mL | Freq: Two times a day (BID) | OROMUCOSAL | Status: DC

## 2019-04-15 MED ORDER — FENTANYL CITRATE (PF) 100 MCG/2ML IJ SOLN
INTRAMUSCULAR | Status: AC
  Filled 2019-04-15: qty 2

## 2019-04-15 MED ORDER — ORAL CARE MOUTH RINSE: 15.0000 mL | OROMUCOSAL | Status: DC

## 2019-04-15 MED ORDER — FENTANYL BOLUS VIA INFUSION
50.0000 ug | INTRAVENOUS | Status: DC | PRN
  Administered 2019-04-15 (×2): 50 ug via INTRAVENOUS
  Filled 2019-04-15: qty 50

## 2019-04-15 MED ORDER — BIOTENE DRY MOUTH MT LIQD: 15.0000 mL | OROMUCOSAL | Status: DC | PRN

## 2019-04-15 MED ORDER — BISACODYL 10 MG RE SUPP: 10.0000 mg | Freq: Every day | RECTAL | Status: DC | PRN

## 2019-04-15 MED ORDER — ALBUMIN HUMAN 25 % IV SOLN
50.0000 g | Freq: Once | INTRAVENOUS | Status: AC
  Administered 2019-04-15: 50 g via INTRAVENOUS
  Filled 2019-04-15: qty 200

## 2019-04-15 MED ORDER — DEXTROSE 5 % IV SOLN: INTRAVENOUS | Status: DC

## 2019-04-15 MED ORDER — ARTIFICIAL TEARS OPHTHALMIC OINT: TOPICAL_OINTMENT | OPHTHALMIC | Status: DC | PRN

## 2019-04-15 MED ORDER — LORAZEPAM 2 MG/ML PO CONC: 1.0000 mg | ORAL | Status: DC | PRN

## 2019-04-15 MED ORDER — HALOPERIDOL 0.5 MG PO TABS
0.5000 mg | ORAL_TABLET | ORAL | Status: DC | PRN
  Filled 2019-04-15: qty 1

## 2019-04-15 MED ORDER — FENTANYL CITRATE (PF) 100 MCG/2ML IJ SOLN: 50.0000 ug | Freq: Once | INTRAMUSCULAR | Status: DC

## 2019-04-15 MED ORDER — ACETAMINOPHEN 650 MG RE SUPP: 650.0000 mg | Freq: Four times a day (QID) | RECTAL | Status: DC | PRN

## 2019-04-15 MED ORDER — FAMOTIDINE IN NACL 20-0.9 MG/50ML-% IV SOLN
20.0000 mg | Freq: Two times a day (BID) | INTRAVENOUS | Status: DC
  Administered 2019-04-15: 20 mg via INTRAVENOUS

## 2019-04-15 NOTE — Progress Notes (Signed)
Progress Note  Patient Name: Andrea Glenn Date of Encounter: 03/31/2019  Primary Cardiologist:   No primary care provider on file.   Subjective   Intubated for airway protection.  Hypotension.  Progressive anemia.  Decreased responsiveness through the night.     Inpatient Medications    Scheduled Meds: . sodium chloride   Intravenous Once  . ascorbic acid  100 mg Per Tube Daily  . aspirin  81 mg Oral Daily  . chlorhexidine  15 mL Mouth Rinse BID  . chlorhexidine gluconate (MEDLINE KIT)  15 mL Mouth Rinse BID  . Chlorhexidine Gluconate Cloth  6 each Topical Q0600  . cholecalciferol  1,000 Units Per Tube Daily  . darbepoetin (ARANESP) injection - DIALYSIS  100 mcg Intravenous Q Thu-HD  . dexamethasone (DECADRON) injection  6 mg Intravenous Q24H  . feeding supplement (PRO-STAT SUGAR FREE 64)  30 mL Per Tube Daily  . fentaNYL      . fentaNYL (SUBLIMAZE) injection  50 mcg Intravenous Once  . insulin aspart  0-9 Units Subcutaneous Q4H  . insulin glargine  10 Units Subcutaneous QHS  . mouth rinse  15 mL Mouth Rinse q12n4p  . mouth rinse  15 mL Mouth Rinse 10 times per day  . metoprolol tartrate  2.5 mg Intravenous Q6H  . midazolam      . pantoprazole sodium  40 mg Per Tube Q24H  . sodium chloride flush  3 mL Intravenous Q12H  . zinc sulfate  220 mg Per Tube Daily   Continuous Infusions: .  prismasol BGK 4/2.5 600 mL/hr at 04/14/19 1952  .  prismasol BGK 4/2.5 300 mL/hr at 03/21/2019 0300  . sodium chloride Stopped (04/13/19 1056)  . sodium chloride    . sodium chloride 10 mL/hr at 03/22/2019 0600  . albumin human    . dexmedetomidine (PRECEDEX) IV infusion Stopped (04/14/19 2023)  . famotidine (PEPCID) IV    . feeding supplement (VITAL 1.5 CAL) Stopped (04/14/19 1836)  . fentaNYL infusion INTRAVENOUS    . heparin 10,000 units/ 20 mL infusion syringe 500 Units/hr (04/13/2019 7939)  . phenylephrine (NEO-SYNEPHRINE) Adult infusion 50 mcg/min (03/21/2019 0600)  . prismasol BGK  4/2.5 1,500 mL/hr at 04/12/2019 0608   PRN Meds: sodium chloride, sodium chloride, sodium chloride, acetaminophen, alteplase, bisacodyl, fentaNYL, haloperidol lactate, heparin, heparin, heparin, heparin, Ipratropium-Albuterol, lidocaine (PF), lidocaine-prilocaine, midazolam, midazolam, ondansetron (ZOFRAN) IV, pentafluoroprop-tetrafluoroeth, polyethylene glycol, sodium chloride   Vital Signs    Vitals:   03/24/2019 0530 04/17/2019 0545 04/13/2019 0645 04/11/2019 0700  BP: (!) 84/54 (!) 84/52 (!) 88/59 (!) 152/66  Pulse: (!) 59 (!) 57 (!) 57 68  Resp: '16 17 11 15  ' Temp:      TempSrc:      SpO2: 100% 100% 100% 97%  Weight:      Height:        Intake/Output Summary (Last 24 hours) at 03/29/2019 0730 Last data filed at 04/06/2019 0700 Gross per 24 hour  Intake 2031.23 ml  Output 2893 ml  Net -861.77 ml   Filed Weights   04/13/19 0500 04/14/19 0500 03/26/2019 0500  Weight: 61.8 kg 60.2 kg 56.5 kg    Telemetry    NSR- Personally Reviewed  ECG    NA - Personally Reviewed  Physical Exam   GEN: No  acute distress.   Neck: No  JVD Cardiac: RRR, no murmurs, rubs, or gallops.  Respiratory:    Diffuse decreased breath sounds GI: Soft, nontender, non-distended, normal bowel sounds  MS:    No edema; No deformity. Neuro:   Nonfocal  Psych: Oriented and appropriate     Labs    Chemistry Recent Labs  Lab 04/11/19 0451 04/12/19 0710 04/13/19 0447 04/14/19 0335 04/14/19 1900 04/06/2019 0425  NA 142 141 139 139 137 139  K 4.5 5.0 4.3 3.9 5.2* 4.7  CL 107 106 107 104 104 106  CO2 15* 15* 19* 22 23 20*  GLUCOSE 172* 275* 111* 204* 143* 112*  BUN 70* 95* 65* 39* 24* 17  CREATININE 5.74* 6.90* 4.19* 2.20* 1.36* 1.18*  CALCIUM 7.4* 7.3* 7.0* 7.1* 7.4* 7.3*  PROT 5.2* 5.0* 4.7*  --   --   --   ALBUMIN 1.5* 1.2* 1.3* 1.4* 1.5* 2.3*  AST 93* 66* 57*  --   --   --   ALT '20 17 17  ' --   --   --   ALKPHOS 59 64 80  --   --   --   BILITOT 0.7 0.4 0.9  --   --   --   GFRNONAA 7* 6* 11*  24* 42* 50*  GFRAA 9* 7* 13* 28* 49* 58*  ANIONGAP 20* 20* '13 13 10 13     ' Hematology Recent Labs  Lab 04/14/19 0335 04/14/19 1900 03/30/2019 0520  WBC 22.3* 21.5* 16.1*  RBC 2.55* 3.44* 1.37*  HGB 6.8* 9.4* 3.8*  HCT 19.9* 27.7* 11.3*  MCV 78.0* 80.5 82.5  MCH 26.7 27.3 27.7  MCHC 34.2 33.9 33.6  RDW 17.4* 16.4* 16.8*  PLT 121* 107* 96*    Cardiac EnzymesNo results for input(s): TROPONINI in the last 168 hours. No results for input(s): TROPIPOC in the last 168 hours.   BNP No results for input(s): BNP, PROBNP in the last 168 hours.   DDimer  Recent Labs  Lab 04/11/19 0451 04/12/19 0710 04/13/19 0447  DDIMER 7.83* 5.50* 7.78*     Radiology    MR BRAIN WO CONTRAST  Result Date: 04/14/2019 CLINICAL DATA:  Stroke follow-up. COVID-19. EXAM: MRI HEAD WITHOUT CONTRAST TECHNIQUE: Multiplanar, multiecho pulse sequences of the brain and surrounding structures were obtained without intravenous contrast. COMPARISON:  Head CT 04/12/2019 FINDINGS: Examination is severely degraded by motion. Additionally. An abbreviated scan protocol was used. Five series are provided for interpretation. Widespread multifocal acute ischemia within both cerebral and cerebellar hemispheres. The largest confluent area is in the right cerebellum. There is no midline shift or other mass effect IMPRESSION: 1. Severely motion degraded examination. 2. Widespread multifocal acute ischemia within both cerebral and cerebellar hemispheres. 3. No midline shift or other mass effect. Electronically Signed   By: Ulyses Jarred M.D.   On: 04/14/2019 00:37    Cardiac Studies   Echo   1. Left ventricular ejection fraction, by visual estimation, is 50 to 55%. The left ventricle has normal function. There is no left ventricular hypertrophy. 2. Left ventricular diastolic function could not be evaluated. 3. The left ventricle has no regional wall motion abnormalities. 4. Global right ventricle has normal systolic  function.The right ventricular size is normal. 5. Left atrial size was normal. 6. Right atrial size was normal. 7. The mitral valve is normal in structure. Trivial mitral valve regurgitation. No evidence of mitral stenosis. 8. The tricuspid valve is normal in structure. 9. The aortic valve is tricuspid. Aortic valve regurgitation is not visualized. Mild aortic valve sclerosis without stenosis. 10. The pulmonic valve was normal in structure. Pulmonic valve regurgitation is not visualized. 11. The inferior  vena cava is normal in size with greater than 50% respiratory variability, suggesting right atrial pressure of 3 mmHg. 12. HR 130-144 at time of study; normal LV function; trace MR and TR; catheter in right atrium.  Patient Profile     58 y.o. female  with a hx of HTN, HLD, poorly controled DM with neurophathy, FSGS with nephrotic syndrome, tobacco smoking and CKD IV who is being seen for the evaluation of elevated troponin at the request of Dr. Nori Riis.   She has COVID 19.    Assessment & Plan     ACUTE ON CHRONIC RENAL INSUFFICIENCY:  Was getting dialysis.   Given hemodynamics she will be on CRRT.    ACUTE RESPIRATORY FAILURE:    Now intubated.    ATRIAL FIB:     Maintaining NSR.      CVA:  Probable embolic events.   Given continued decline in neuro status along with multiorgan involvement the prognosis is very poor.     END OF LIFE DISCUSSIONS:  Agree that given the multiorgan events with the acute neurologic decompensation that end of life discussions are indicated.    Cardiology will sign off.  Please call with further questions.    For questions or updates, please contact [place] Please consult [email or web address] for contact info under Cardiology/STEMI.   Signed, Minus Breeding, MD  03/29/2019, 7:30 AM

## 2019-04-15 NOTE — Progress Notes (Addendum)
ETT tube pulled back 3cm per MD order. ETT tube is now at 22 at the lip and secured.

## 2019-04-15 NOTE — Progress Notes (Signed)
NAME:  Andrea Glenn, MRN:  [id number], DOB:  1961-03-17, LOS: 7 ADMISSION DATE:  04/13/2019, CONSULTATION DATE:  04/12/2019 REFERRING MD: Dolores Hoose CHIEF COMPLAINT:  Hypoxia, AMS    Brief History   58 yo female presented to ER with leg swelling and pain.  Found to have4 COVID 19 pneumonia.  Has hx of nephrotic syndrome with FSGS and DM nephropathy, CKD 4.  She was previously treated with prograf, and last received rituximab in October 2020.  Found to have worsening renal fx.  Had fever 101.8.  required supplemental oxygen.  U/A suggestive of UTI.  Found to have elevated troponin and a fib with RVR.  Had HD catheter placed with plan for iHD.  Had worsening mental status.  CT head suggestive of acute CVA.  She was transferred to ICU.  Past Medical History  A fib, CKD 4, DM, DM nephropathy, DM neuropathy, FSGS, Emphysema, Frequent falls, HLD, HTN, Pancreatitis, Shingles, RLS, Tremor  Significant Hospital Events   12/20 Admitted 12/24 transfer to ICU 12/25 covert to sinus bradycardia >> amiodarone held 12/26 start on precedex, transfuse 1 unit PRBC 12/27 worsening mental status, severe anemia, intubated  Consults:  Nephrology Neurology Cardiology >> s/o 12/27 Palliative care  Procedures:  12/22 right IJ tunneled HD catheter >>  12/27 ETT >>   Significant Diagnostic Tests:  Echo 12/23 >> EF 50 to 55% CT head 12/24 >> low density Rt cerebellar hemisphere concerning for acute to subacute infarcts, chronic b/l BG lacunar infarcts CT angio head/neck 12/24 >> calcification Lt carotid bifurcation, diffuse small vessel disease MRI brain 12/25 >> widespread multifocal acute ischemia within both cerebral and cerebellar hemispheres  Micro Data:  12/20 SARS Cov2 >> positive 12/20 Blood >> Bacillus  12/20 Urine >> Klebsiella-ampicillin resistant, nitrofurantoin intermediate 12/24 Blood >>  COVID 19 treatment:  Remdesivir 12/20 >>12/24 Dexamethasone 12/20 >>  Antimicrobials/COVID19  treatment  Azithromycin 12/19 x 1 dose Ceftriaxone 12/19>12/21, 12/23>12/24 Cefazolin 12/22 SCIP Cefepime 12/24 >> 12/25 Vancomycin 12/24 >> 12/25  Interim history/subjective:  Intubated overnight.  Received PRBC transfusions.  Objective   Blood pressure (!) 169/89, pulse 67, temperature (!) 97.5 F (36.4 C), temperature source Axillary, resp. rate 18, height 5\' 4"  (1.626 m), weight 56.5 kg, SpO2 100 %.    Vent Mode: PRVC FiO2 (%):  [40 %-100 %] 40 % Set Rate:  [18 bmp] 18 bmp Vt Set:  [330 mL] 330 mL PEEP:  [5 cmH20] 5 cmH20 Plateau Pressure:  [15 cmH20] 15 cmH20   Intake/Output Summary (Last 24 hours) at 04/14/2019 [id number] Last data filed at 04/04/2019 0749 Gross per 24 hour  Intake 2182.41 ml  Output 2517 ml  Net -334.59 ml   Filed Weights   04/13/19 0500 04/14/19 0500 03/28/2019 0500  Weight: 61.8 kg 60.2 kg 56.5 kg    Examination:  General - obtunded Eyes - unequal pupils ENT - ETT in place Cardiac - regular rate/rhythm, no murmur Chest - scattered rhonchi Abdomen - soft, non tender, decreased bowel sounds Extremities - 1+ edema Skin - no rashes Neuro - not following commands  CXR (reviewed by me) - Rt > Lt ASD   Resolved Hospital Problem list   Klebsiella UTI  Assessment & Plan:   Compromised airway requiring intubation 12/27. - full vent support - f/u CXR  Acute hypoxic respiratory failure from COVID 19 pneumonia. - goal SpO2 > 90% - day 8/10 of decadron - completed remdesivir - zinc, vit C, vit D  Acute metabolic encephalopathy from hypoxia, renal  failure, COVID 19 pneumonia, and UTI. Multifocal acute CVA >> likely embolic. - worsening mental status 12/27 in setting of severe anemia and hypotension; concerned she has extended her stroke - repeat CT head - RASS goal 0  Symptomatic anemia. - no clear source of bleeding - f/u CT abd/pelvis - f/u CBC - transfuse for Hb < 7 or significant bleeding  New onset A fib with RVR >> converted to  sinus bradycardia 12/25. Elevated troponin from demand ischemia. - monitor clinically  AKI from ATN. CKD 4 with FSGS and DM nephropathy. Metabolic acidosis. - renal replacement per nephrology  Poorly controlled DM type 2 with steroid induced hyperglycemia. - SSI with lantus  Goals of care. - spoke with pt's daughter in AM of 12/27 > she is still processing information and wasn't able to make decisions about goals of care - palliative care consulted and tentative plan for family meeting on 12/27  Best practice:  Diet: tube feeds DVT prophylaxis: SCDs GI prophylaxis: protonix Mobility: Bedrest Code Status: Full Disposition: ICU  Labs    CMP Latest Ref Rng & Units 03/27/2019 04/06/2019 04/14/2019  Glucose 70 - 99 mg/dL - 112(H) 143(H)  BUN 6 - 20 mg/dL - 17 24(H)  Creatinine 0.44 - 1.00 mg/dL - 1.18(H) 1.36(H)  Sodium 135 - 145 mmol/L 139 139 137  Potassium 3.5 - 5.1 mmol/L 4.0 4.7 5.2(H)  Chloride 98 - 111 mmol/L - 106 104  CO2 22 - 32 mmol/L - 20(L) 23  Calcium 8.9 - 10.3 mg/dL - 7.3(L) 7.4(L)  Total Protein 6.5 - 8.1 g/dL - - -  Total Bilirubin 0.3 - 1.2 mg/dL - - -  Alkaline Phos 38 - 126 U/L - - -  AST 15 - 41 U/L - - -  ALT 0 - 44 U/L - - -    CBC Latest Ref Rng & Units 04/17/2019 03/30/2019 04/14/2019  WBC 4.0 - 10.5 K/uL - 16.1(H) 21.5(H)  Hemoglobin 12.0 - 15.0 g/dL 6.1(LL) 3.8(LL) 9.4(L)  Hematocrit 36.0 - 46.0 % 18.0(L) 11.3(L) 27.7(L)  Platelets 150 - 400 K/uL - 96(L) 107(L)    ABG    Component Value Date/Time   PHART 7.345 (L) 04/04/2019 0904   PCO2ART 45.1 04/04/2019 0904   PO2ART 537.0 (H) 04/07/2019 0904   HCO3 24.8 03/21/2019 0904   TCO2 26 03/28/2019 0904   ACIDBASEDEF 1.0 03/25/2019 0904   O2SAT 100.0 03/31/2019 0904    CBG (last 3)  Recent Labs    04/14/19 2309 03/24/2019 0351 04/07/2019 0814  GLUCAP 148* 111* 180*    CC time 36 minutes  Chesley Mires, MD [facility] Pulmonary/Critical Care 04/16/2019, 9:24 AM

## 2019-04-15 NOTE — Progress Notes (Signed)
Provider notified of changes in left pupil now oval, ct head ordered, no other orders at this time

## 2019-04-15 NOTE — Progress Notes (Signed)
Elink notified of patient continued hypotension  after receiving albumin. Patient not on any sedation at this time, not pulling off any fluid with CRRT. Patient mental status unchanged.

## 2019-04-15 NOTE — Significant Event (Signed)
Overnight hypotension and significant drop in hgb to 3.78. Concerning for RPB. No longer protecting airway. Overnight team emergently intubated patient has ordered 4 PRBC. STAT abdominal CT ordered   -Vent stabilization orders, 6cc  -STAT CXR -ABG in 1 hr -Fentanyl, PRN versed for sedation -follow up CT results   Eliseo Gum MSN, AGACNP-BC [place] [id number] If no answer, [id number] 04/11/2019, 7:19 AM

## 2019-04-15 NOTE — Progress Notes (Signed)
CRITICAL VALUE STICKER  CRITICAL VALUE: lactic acid 6.8  MD NOTIFIED: Sood  No orders at this time.

## 2019-04-15 NOTE — Progress Notes (Signed)
Admit: 03/25/2019 LOS: 7  36F with CKD4 from FSGS, DM2 with DN, COVID19 and multifocal CVA  Subjective:  . AMS overnight,  Intubated . Hb 3s this AM, stat imaging with RP bleed . Head CT with ICH in location of ischemic CVA . CRRT on hold at current time, K 4.0 . To have family meeting today  12/26 0701 - 12/27 0700 In: 2031.2 [I.V.:250.7; Blood:712.3; NG/GT:639.2; IV Piggyback:429.1] Out: 2893 [Urine:100; Emesis/NG output:150]  Filed Weights   04/13/19 0500 04/14/19 0500 03/27/2019 0500  Weight: 61.8 kg 60.2 kg 56.5 kg    Scheduled Meds: . ascorbic acid  100 mg Per Tube Daily  . chlorhexidine gluconate (MEDLINE KIT)  15 mL Mouth Rinse BID  . Chlorhexidine Gluconate Cloth  6 each Topical Q0600  . cholecalciferol  1,000 Units Per Tube Daily  . darbepoetin (ARANESP) injection - DIALYSIS  100 mcg Intravenous Q Thu-HD  . dexamethasone (DECADRON) injection  6 mg Intravenous Q24H  . feeding supplement (PRO-STAT SUGAR FREE 64)  30 mL Per Tube Daily  . fentaNYL (SUBLIMAZE) injection  50 mcg Intravenous Once  . insulin aspart  0-9 Units Subcutaneous Q4H  . insulin glargine  10 Units Subcutaneous QHS  . mouth rinse  15 mL Mouth Rinse 10 times per day  . sodium chloride flush  3 mL Intravenous Q12H  . zinc sulfate  220 mg Per Tube Daily   Continuous Infusions: .  prismasol BGK 4/2.5 600 mL/hr at 04/14/19 1952  .  prismasol BGK 4/2.5 300 mL/hr at 03/24/2019 0300  . sodium chloride 100 mL (03/27/2019 1049)  . sodium chloride 100 mL (04/05/2019 1051)  . sodium chloride 10 mL/hr at 03/28/2019 0600  . dexmedetomidine (PRECEDEX) IV infusion Stopped (04/14/19 2023)  . famotidine (PEPCID) IV 20 mg (04/11/2019 1051)  . feeding supplement (VITAL 1.5 CAL) Stopped (04/14/19 1836)  . fentaNYL infusion INTRAVENOUS 200 mcg/hr (03/23/2019 0841)  . heparin 10,000 units/ 20 mL infusion syringe 500 Units/hr (03/31/2019 1937)  . phenylephrine (NEO-SYNEPHRINE) Adult infusion 50 mcg/min (03/20/2019 0600)  . prismasol  BGK 4/2.5 1,500 mL/hr at 04/10/2019 0608   PRN Meds:.sodium chloride, sodium chloride, sodium chloride, acetaminophen, alteplase, bisacodyl, fentaNYL, heparin, heparin, heparin, heparin, Ipratropium-Albuterol, lidocaine (PF), lidocaine-prilocaine, midazolam, ondansetron (ZOFRAN) IV, pentafluoroprop-tetrafluoroeth, polyethylene glycol, sodium chloride  Current Labs: reviewed    Physical Exam:  Blood pressure 97/73, pulse 61, temperature (!) 97.5 F (36.4 C), temperature source Axillary, resp. rate 15, height 5' 4" (1.626 m), weight 56.5 kg, SpO2 100 %. Intubated, not awake Regular Coarse bs b/l S/nt HD Cath bandaged, clean  A 1. AKI on CKD4 from FSGS/DN; was on CRRT currently held 2/2 #2 and #3 2. Ischemic multifocal CVA now hemorrhagic transformation, worsened neuro status 3. Reptroperitoneal hemorrhage with severe ABLA 4. COVID19 infection, per CCM, remdesivir and decadron 5. Acidosis, improved with CRRT   P . Cont to hold CRRT for now given hemodynamic instability and very poor prognosis . Await family meeting . Will follow along  Pearson Grippe MD 04/02/2019, 12:03 PM  Recent Labs  Lab 04/14/19 0335 04/14/19 1900 04/13/2019 0425 04/03/2019 0904  NA 139 137 139 139  K 3.9 5.2* 4.7 4.0  CL 104 104 106  --   CO2 22 23 20*  --   GLUCOSE 204* 143* 112*  --   BUN 39* 24* 17  --   CREATININE 2.20* 1.36* 1.18*  --   CALCIUM 7.1* 7.4* 7.3*  --   PHOS 2.8 2.4* 2.3*  --  Recent Labs  Lab 04/14/19 0335 04/14/19 1900 04/01/2019 0520 03/23/2019 0904  WBC 22.3* 21.5* 16.1*  --   HGB 6.8* 9.4* 3.8* 6.1*  HCT 19.9* 27.7* 11.3* 18.0*  MCV 78.0* 80.5 82.5  --   PLT 121* 107* 96*  --              

## 2019-04-15 NOTE — Progress Notes (Signed)
Patient CRRT stopped and blood returned prior to intubation. Patient given 30 of etomidate and 80 of rocuronium during intubation. 2 units of blood given rapidly per MD instruction. Verified with 2 RNs

## 2019-04-15 NOTE — Plan of Care (Signed)
Overnight event noted. Pt had worsening mental status and not able to protect airway, she was intubated. She was found to have profound anemia with Hb 3.8, had 4 units PRBC and repeat Hb 6.1. stat CT abd/pelvis showed b/l large RP. Her CRRT was on hold and heparin was stopped. She also had severe hypotension, received albumin and IVF. Currently 38s. Repeat CT head also found to have small right frontal hemorrhagic conversion in the setting of multifocal embolic infarcts.   Pt deteriorated over night and now near EOL. Palliative care on board and informing family about the current situation. At this time, neurology has no new recommendations. Agree with palliative care and comfort care measures. We will sign off for now and please call for any questions. Thank you for the consult.  Rosalin Hawking, MD PhD Stroke Neurology 04/17/2019 3:32 PM  To contact Stroke Continuity provider, please refer to [email or web address] After hours, contact General Neurology

## 2019-04-15 NOTE — Progress Notes (Signed)
eLink Physician-Brief Progress Note Patient Name: ORIANE WIELGUS DOB: [birth year] MRN: [id number]   Date of Service  04/17/2019  HPI/Events of Note  Hypotension on CRRT in the context of a recent serum Albumin of 1.3.  eICU Interventions  Albumin 25 % 50 gm iv bolus x 1, Ultrafiltration is on hold.        Kerry Kass Benjaman Artman 04/02/2019, 12:09 AM

## 2019-04-15 NOTE — Progress Notes (Signed)
Patient transported to CT and back to [id number] without complications. RN at bedside.

## 2019-04-15 NOTE — Progress Notes (Signed)
Repeat  Hgb 3.8 result called to Dr. Lucile Shutters . Informed Dr of patient acute mental status changes. Patient no longer responding to pain, guppy breathing.

## 2019-04-15 NOTE — Progress Notes (Signed)
eLink Physician-Brief Progress Note Patient Name: Andrea Glenn DOB: 02/14/1961 MRN: [id number]   Date of Service  04/01/2019  HPI/Events of Note  Hemoglobin was 4 gm on CBC without evidence of overt bleeding, CBC is being repeated to verify result  eICU Interventions  Will await results of repeat CBC, no new orders for now.        Kerry Kass Gamble Enderle 04/05/2019, 5:36 AM

## 2019-04-15 NOTE — Progress Notes (Signed)
Patient extubated per comfort care measures. RN at bedside.

## 2019-04-15 NOTE — Progress Notes (Signed)
[place] Progress Note Patient Name: Andrea Glenn DOB: 03-Apr-1961 MRN: [id number]   Date of Service  04/02/2019  HPI/Events of Note  Blood pressure remains soft  eICU Interventions  Normal Saline 250 ml iv bolus x 1, Phenylephrine infusion as necessary to maintain a MAP > 65 mmHg.        Kerry Kass Tonji Elliff 03/20/2019, 2:45 AM

## 2019-04-15 NOTE — Procedures (Signed)
Endotracheal Intubation Procedure Note  Indication for endotracheal intubation: airway compromise and impending airway compromise. Airway Assessment: Mallampati Class: I (soft palate, uvula, fauces, and tonsillar pillars visible). Sedation: etomidate. Paralytic: rocuronium. Lidocaine: no. Atropine: no. Equipment: Macintosh 3 laryngoscope blade. Cricoid Pressure: no. Number of attempts: 1. ETT location confirmed by by auscultation.  Andrea Glenn 04/19/2019

## 2019-04-15 NOTE — Consult Note (Signed)
Consultation Note Date: 04/09/2019   Patient Name: Andrea Glenn  DOB: Oct 21, 1960  MRN: [id number]  Age / Sex: 58 y.o., female  PCP: Guadalupe Dawn, MD Referring Physician: Chesley Mires, MD  Reason for Consultation: Establishing goals of care, Psychosocial/spiritual support and Terminal Care  HPI/Patient Profile: 58 y.o. female  with past medical history of CKD IV (FSGS), DM2 with neuropathy, pancreatitis, frequent falls, emphysema, and atrial fibrillation who was admitted on 03/28/2019 with COVID 19 pneumonia.  The patient has had a very complex course.  She had afib with RVR on admission as well as VRE UTI and bactremia.  Her CKD worsened and she required CRRT.  Unfortunately she had an embolic shower of strokes causing diffuse acute ischemia of her brain. She remains severely encephalopathic.  She had multiple drops in hemoglobin.  This am her hgb dropped to 3.8 and her lactic acid rose to 6.8.  She was intubated.  CT scan of her abdomen showed bilateral retroperitoneal hematomas.  To make matters worse the only contact listed in the chart was the grandchild she lives with named Hidalgo.  He has autism and had not been answering his smart phone so her family did not know she was hospitalized.  We were able to reach the family yesterday and scheduled a [place] meeting for today at 12:00.  Clinical Assessment and Goals of Care:  I have reviewed medical records including EPIC notes, labs and imaging, received report from Dr. Halford Chessman and Sam Ward ICU RN and then met her daughter [name] and her fiance Hall Busing to discuss diagnosis prognosis, and end of life care.  Monique used Zoom to add on other members of her family to listen to our conversation.  I talked Beckie Busing and her family thru the course of the hospitalization and explained that her mother was nearing end of life due to [place] 20.  She has had brain  damage from the strokes and she has had massive internal bleeding this morning.    Lorenzo and I provided empathy and emotional support as Monique sobbed.  Her family provided support and love via the Zoom call.  After a time I was able to explain the process of visiting her mother and then progressing thru end of life care.  As Linus Mako and I walked towards [facility] her pastor and additional family members came to the hospital for support.     Monique explained that she lost immediate family members on August and October to [place] 19.  She is an only child losing her mother.   Primary Decision Maker:  NEXT OF KIN     SUMMARY OF RECOMMENDATIONS    Code Status/Advance Care Planning:  DNR   Symptom Management:   Fentanyl for pain and air hunger  Ativan for anxiety nearing EOL  Robinul for secretions.  Additional Recommendations (Limitations, Scope, Preferences):  Full Comfort Care  Palliative Prophylaxis:   Frequent Pain Assessment  Psycho-social/Spiritual:  Desire for further Chaplaincy support:  Family pastor present  Prognosis:  Minutes to  hours.    Discharge Planning: Anticipated Hospital Death      Primary Diagnoses: Present on Admission: . Acute-on-chronic kidney injury ([place]) . Acute cystitis without hematuria . Focal segmental glomerulosclerosis with chronic glomerulonephritis . Diabetic nephropathy ([place]) . Anemia of renal disease . Pneumonia due to COVID-19 virus . Acute hypoxemic respiratory failure due to COVID-19 ([place]) . Emphysema lung ([place])   I have reviewed the medical record, interviewed the patient and family, and examined the patient. The following aspects are pertinent.  Past Medical History:  Diagnosis Date  . Acute hypoxemic respiratory failure due to COVID-19 ([place]) 04/11/2019  . Anemia of renal disease 06/21/2017  . Atrial fibrillation ([place]) 04/11/2019  . Chronic kidney disease (CKD), stage IV (severe) ([place]) 07/08/2017  . CKD (chronic  kidney disease)   . Diabetes mellitus without complication ([place])   . Diabetic nephropathy ([place]) 04/11/2019  . Emphysema lung ([place]) 04/11/2019   Chest CT 09/30/17: Mild emphysematous changes within the lung apices, with associated biapical pleuroparenchymal scarring/thickening  . Fall 11/22/2018  . Focal segmental glomerulosclerosis with chronic glomerulonephritis 04/11/2019  . Frequent falls 07/22/2017  . Grief 09/08/2017  . Hematuria 01/20/2017   Noted on UA's in June & Oct 2018, possibly related to UTI '[ ]'  recheck UA at future visit  . Hemodialysis status ([place]) 04/11/2019  . Hyperlipidemia associated with type 2 diabetes mellitus ([place]) 10/26/2013   ASCVD score 9.1% as of 11/12/2016. Recommend high intensity statin. Allergy to atorvastatin (suicidal thoughts).  . Hypertension   . Nephrotic syndrome   . Neuropathy   . Pancreatitis 10/08/2016  . Pneumonia due to COVID-19 virus 04/11/2019  . Postherpetic neuralgia 09/08/2017  . Primary hypertension 02/03/2017  . Restless leg syndrome 03/03/2010   Qualifier: Diagnosis of  By: Kennon Rounds MD, Lavella Lemons    . Solitary pulmonary nodule 10/10/2017   Chest CT 09/30/17:  2. 3 mm pulmonary nodule within the RIGHT upper lobe. No follow-up needed if patient is low-risk. Non-contrast chest CT can be considered in 12 months if patient is high-risk. Given the biapical emphysematous change, patient is likely high risk and the follow-up CT is likely indicated. This recommendation follows the consensus statement: Guidelines for Management of Incidental P  . Symptomatic anemia 04/05/2018  . Tobacco use disorder 06/16/2006   Every day smoker. Cigarettes only. Currently smoking 1/2 packs per day since age 71. Has 20-pack-year history. Previous cessation attempt in 1996 with success for 2 years until about the family member.  . Tremor   . Uncontrolled type 2 diabetes mellitus with peripheral neuropathy ([place]) 06/16/2006   Poorly controlled on Tresiba only. Previously on glipizide,  glimpiride, and metformin.    Social History   Socioeconomic History  . Marital status: Single    Spouse name: Not on file  . Number of children: Not on file  . Years of education: Not on file  . Highest education level: Not on file  Occupational History  . Not on file  Tobacco Use  . Smoking status: Current Every Day Smoker    Packs/day: 0.10    Years: 20.00    Pack years: 2.00    Types: Cigarettes    Start date: 28  . Smokeless tobacco: Never Used  . Tobacco comment: working on quitting - 2 per day - 2.26.19  Substance and Sexual Activity  . Alcohol use: No  . Drug use: No  . Sexual activity: Not on file  Other Topics Concern  . Not on file  Social History Narrative  Single. Lives with her son. Current every day smoker.    Social Determinants of Health   Financial Resource Strain:   . Difficulty of Paying Living Expenses: Not on file  Food Insecurity:   . Worried About Charity fundraiser in the Last Year: Not on file  . Ran Out of Food in the Last Year: Not on file  Transportation Needs:   . Lack of Transportation (Medical): Not on file  . Lack of Transportation (Non-Medical): Not on file  Physical Activity:   . Days of Exercise per Week: Not on file  . Minutes of Exercise per Session: Not on file  Stress:   . Feeling of Stress : Not on file  Social Connections:   . Frequency of Communication with Friends and Family: Not on file  . Frequency of Social Gatherings with Friends and Family: Not on file  . Attends Religious Services: Not on file  . Active Member of Clubs or Organizations: Not on file  . Attends Archivist Meetings: Not on file  . Marital Status: Not on file   Family History  Problem Relation Age of Onset  . Kidney failure Mother   . Diabetes Mother   . Thyroid disease Mother   . Hypertension Mother   . Heart failure Mother   . Kidney failure Father   . Cancer Sister   . Kidney failure Sister   . Stroke Brother   . Cancer  Other   . Kidney failure Maternal Grandmother   . Kidney failure Maternal Grandfather   . Kidney failure Sister   . Heart attack Sister    Scheduled Meds: . chlorhexidine gluconate (MEDLINE KIT)  15 mL Mouth Rinse BID  . LORazepam  2 mg Intravenous Once  . mouth rinse  15 mL Mouth Rinse 10 times per day  . sodium chloride flush  3 mL Intravenous Q12H   Continuous Infusions: . sodium chloride 100 mL (04/06/2019 1049)  . dexmedetomidine (PRECEDEX) IV infusion 1.294 mcg/kg/hr (04/16/2019 1000)  . dextrose    . fentaNYL infusion INTRAVENOUS 300 mcg/hr (04/06/2019 1400)   PRN Meds:.sodium chloride, acetaminophen **OR** acetaminophen, acetaminophen, alteplase, antiseptic oral rinse, artificial tears, bisacodyl, diphenhydrAMINE, fentaNYL, glycopyrrolate **OR** glycopyrrolate **OR** glycopyrrolate, glycopyrrolate **OR** glycopyrrolate **OR** glycopyrrolate, haloperidol **OR** haloperidol **OR** haloperidol lactate, lidocaine (PF), lidocaine-prilocaine, LORazepam **OR** LORazepam **OR** LORazepam, ondansetron (ZOFRAN) IV, ondansetron **OR** ondansetron (ZOFRAN) IV, pentafluoroprop-tetrafluoroeth, polyvinyl alcohol, polyvinyl alcohol, sodium chloride Allergies  Allergen Reactions  . Atorvastatin Other (See Comments)    Suicidal thoughts  . Statins Other (See Comments)    This class of meds causes "suicidal thoughts"  . Amitriptyline Other (See Comments)    Depression  . Metformin And Related Diarrhea  . Adhesive [Tape] Other (See Comments)    "Sometimes peels off my skin"  . Gabapentin Other (See Comments)    "Gives me the shakes"  . Latex Rash    Vital Signs: BP (!) 85/64   Pulse (!) 54   Temp (!) 97.5 F (36.4 C) (Axillary)   Resp 17   Ht '5\' 4"'  (1.626 m)   Wt 56.5 kg   SpO2 100%   BMI 21.38 kg/m  Pain Scale: CPOT POSS *See Group Information*: 1-Acceptable,Awake and alert Pain Score: 0-No pain   SpO2: SpO2: 100 % O2 Device:SpO2: 100 % O2 Flow Rate: .O2 Flow Rate (L/min): 2  L/min  IO: Intake/output summary:   Intake/Output Summary (Last 24 hours) at 04/09/2019 1537 Last data filed at 04/06/2019 1400 Gross per  24 hour  Intake 2400.47 ml  Output 1513 ml  Net 887.47 ml    LBM: Last BM Date: 04/14/19 Baseline Weight: Weight: 64.4 kg Most recent weight: Weight: 56.5 kg     Palliative Assessment/Data:10%     Time In: 12:00 Time Out: 1:10 Time Total: 70 min Visit consisted of counseling and education dealing with the complex and emotionally intense issues surrounding the need for palliative care and symptom management in the setting of serious and potentially life-threatening illness. Greater than 50%  of this time was spent counseling and coordinating care related to the above assessment and plan.  Signed by: Florentina Jenny, PA-C Palliative Medicine Pager: [phone]  Please contact Palliative Medicine Team phone at [phone] for questions and concerns.  For individual provider: See Shea Evans

## 2019-04-16 DIAGNOSIS — J1289 Other viral pneumonia: Secondary | ICD-10-CM

## 2019-04-16 DIAGNOSIS — U071 COVID-19: Secondary | ICD-10-CM

## 2019-04-16 DIAGNOSIS — N170 Acute kidney failure with tubular necrosis: Secondary | ICD-10-CM

## 2019-04-16 DIAGNOSIS — N184 Chronic kidney disease, stage 4 (severe): Secondary | ICD-10-CM

## 2019-04-16 DIAGNOSIS — I6310 Cerebral infarction due to embolism of unspecified precerebral artery: Secondary | ICD-10-CM

## 2019-04-16 DIAGNOSIS — Z515 Encounter for palliative care: Secondary | ICD-10-CM

## 2019-04-16 LAB — POCT ACTIVATED CLOTTING TIME: Activated Clotting Time: 180 seconds

## 2019-04-16 LAB — GLUCOSE, CAPILLARY: Glucose-Capillary: 155 mg/dL — ABNORMAL HIGH (ref 70–99)

## 2019-04-16 MED ORDER — LORAZEPAM 2 MG/ML IJ SOLN
0.5000 mg | Freq: Four times a day (QID) | INTRAMUSCULAR | Status: DC
  Administered 2019-04-16 – 2019-04-18 (×8): 0.5 mg via INTRAVENOUS
  Filled 2019-04-16 (×8): qty 1

## 2019-04-16 NOTE — Progress Notes (Signed)
NAME:  Andrea Glenn, MRN:  [id number], DOB:  12/04/1960, LOS: 8 ADMISSION DATE:  04/09/2019, CONSULTATION DATE:  04/12/2019 REFERRING MD: Dolores Hoose CHIEF COMPLAINT:  Hypoxia, AMS    Brief History   58 yo female presented to ER with leg swelling and pain.  Found to have4 COVID 19 pneumonia.  Has hx of nephrotic syndrome with FSGS and DM nephropathy, CKD 4.  She was previously treated with prograf, and last received rituximab in October 2020.  Found to have worsening renal fx.  Had fever 101.8.  required supplemental oxygen.  U/A suggestive of UTI.  Found to have elevated troponin and a fib with RVR.  Had HD catheter placed with plan for iHD.  Had worsening mental status.  CT head suggestive of acute CVA.  She was transferred to ICU.  Past Medical History  A fib, CKD 4, DM, DM nephropathy, DM neuropathy, FSGS, Emphysema, Frequent falls, HLD, HTN, Pancreatitis, Shingles, RLS, Tremor  Significant Hospital Events   12/20 Admitted 12/24 transfer to ICU 12/25 covert to sinus bradycardia >> amiodarone held 12/26 start on precedex, transfuse 1 unit PRBC 12/27 worsening mental status, severe anemia, intubated 12/27: comfort care measures initiated   Consults:  Nephrology Neurology Cardiology >> s/o 12/27 Palliative care  Procedures:  12/22 right IJ tunneled HD catheter >>  12/27 ETT >> 12/27  Significant Diagnostic Tests:  Echo 12/23 >> EF 50 to 55% CT head 12/24 >> low density Rt cerebellar hemisphere concerning for acute to subacute infarcts, chronic b/l BG lacunar infarcts CT angio head/neck 12/24 >> calcification Lt carotid bifurcation, diffuse small vessel disease MRI brain 12/25 >> widespread multifocal acute ischemia within both cerebral and cerebellar hemispheres  Micro Data:  12/20 SARS Cov2 >> positive 12/20 Blood >> Bacillus  12/20 Urine >> Klebsiella-ampicillin resistant, nitrofurantoin intermediate 12/24 Blood >>  COVID 19 treatment:  Remdesivir 12/20  >>12/24 Dexamethasone 12/20 >>  Antimicrobials/COVID19 treatment  Azithromycin 12/19 x 1 dose Ceftriaxone 12/19>12/21, 12/23>12/24 Cefazolin 12/22 SCIP Cefepime 12/24 >> 12/25 Vancomycin 12/24 >> 12/25  Interim history/subjective:  12/28: transitioned to comfort yesterday. Agonal respirations today. Unresponsive. D/w daughter, as she was encouraged her mother is still alive and was inquiring about reversing her decision. I d/w her that her mother is dying and that reversing her decision would not change her outcome. She expressed understanding and is continuing with comfort measures at this time.  12/27:Intubated overnight.  Received PRBC transfusions.  Objective   Blood pressure (!) 54/32, pulse (!) 35, temperature (!) 97.5 F (36.4 C), temperature source Axillary, resp. rate 10, height 5\' 4"  (1.626 m), weight 56.5 kg, SpO2 91 %.    FiO2 (%):  [40 %] 40 %   Intake/Output Summary (Last 24 hours) at 04/16/2019 1118 Last data filed at 04/16/2019 0700 Gross per 24 hour  Intake 706.19 ml  Output --  Net 706.19 ml   Filed Weights   04/13/19 0500 04/14/19 0500 03/27/2019 0500  Weight: 61.8 kg 60.2 kg 56.5 kg    Examination:  General - obtunded Eyes - unequal pupils ENT - ETT in place Cardiac - regular rate/rhythm, no murmur Chest - scattered rhonchi Abdomen - soft, non tender, decreased bowel sounds Extremities - 1+ edema Skin - no rashes Neuro - not following commands  CXR (reviewed by me) - Rt > Lt ASD   Resolved Hospital Problem list   Klebsiella UTI  Assessment & Plan:   Compromised airway requiring intubation 12/27. - comfort measures.  -agonal respirations at this time.  Acute hypoxic respiratory failure from COVID 19 pneumonia. - goal SpO2 > 90% - s/p decadron - completed remdesivir - zinc, vit C, vit D  Acute metabolic encephalopathy from hypoxia, renal failure, COVID 19 pneumonia, and UTI. Multifocal acute CVA >> likely embolic. - worsening mental  status 12/27 in setting of severe anemia and hypotension; concerned she has extended her stroke - comfort measures. Fentanyl as needed - RASS goal 0  Symptomatic anemia. - no clear source of bleeding  -no longer following labs  New onset A fib with RVR >> converted to sinus bradycardia 12/25. Elevated troponin from demand ischemia. - monitor clinically  AKI from ATN. CKD 4 with FSGS and DM nephropathy. Metabolic acidosis. - renal replacement per nephrology  Poorly controlled DM type 2 with steroid induced hyperglycemia. - SSI with lantus  Goals of care. -as above spoke with duaghter this am. 12/28 remains comfort care.   Best practice:  Diet: DVT prophylaxis: SCDs GI prophylaxis: protonix Mobility: Bedrest Code Status: comfort measures.  Disposition: ICU  Labs    CMP Latest Ref Rng & Units 04/16/2019 04/17/2019 04/14/2019  Glucose 70 - 99 mg/dL - 112(H) 143(H)  BUN 6 - 20 mg/dL - 17 24(H)  Creatinine 0.44 - 1.00 mg/dL - 1.18(H) 1.36(H)  Sodium 135 - 145 mmol/L 139 139 137  Potassium 3.5 - 5.1 mmol/L 4.0 4.7 5.2(H)  Chloride 98 - 111 mmol/L - 106 104  CO2 22 - 32 mmol/L - 20(L) 23  Calcium 8.9 - 10.3 mg/dL - 7.3(L) 7.4(L)  Total Protein 6.5 - 8.1 g/dL - - -  Total Bilirubin 0.3 - 1.2 mg/dL - - -  Alkaline Phos 38 - 126 U/L - - -  AST 15 - 41 U/L - - -  ALT 0 - 44 U/L - - -    CBC Latest Ref Rng & Units 03/27/2019 04/12/2019 04/14/2019  WBC 4.0 - 10.5 K/uL - 16.1(H) 21.5(H)  Hemoglobin 12.0 - 15.0 g/dL 6.1(LL) 3.8(LL) 9.4(L)  Hematocrit 36.0 - 46.0 % 18.0(L) 11.3(L) 27.7(L)  Platelets 150 - 400 K/uL - 96(L) 107(L)    ABG    Component Value Date/Time   PHART 7.345 (L) 04/17/2019 0904   PCO2ART 45.1 03/25/2019 0904   PO2ART 537.0 (H) 04/03/2019 0904   HCO3 24.8 03/31/2019 0904   TCO2 26 04/08/2019 0904   ACIDBASEDEF 1.0 04/03/2019 0904   O2SAT 100.0 04/19/2019 0904    CBG (last 3)  Recent Labs    04/12/2019 0351 04/03/2019 0814 04/17/2019 1211   GLUCAP 111* 180* 191*   Critical care time: The patient is critically ill with multiple organ systems failure and requires high complexity decision making for assessment and support, frequent evaluation and titration of therapies, application of advanced monitoring technologies and extensive interpretation of multiple databases.  Critical care time 35 mins. This represents my time independent of the NPs time taking care of the pt. This is excluding procedures.    Audria Nine DO [facility] 04/16/2019, 11:18 AM

## 2019-04-16 NOTE — Progress Notes (Signed)
Palliative follow up.  Chart checked.  Will communicate with [facility] staff to see if there is anything else we can provide.  Andrea Glenn was extubated yesterday.  She is on full comfort care only measures and is not expected to live very long.  Recommended weaning oxygen to room air in order not to prolong her dying process.  Scheduled low dose ativan regularly in addition to fentanyl gtt in order to ensure comfort.  Please do not hesitate to call our office if we can assist in any way.  Florentina Jenny, PA-C Palliative Medicine Office:  [phone]  15 min.

## 2019-04-16 NOTE — Progress Notes (Signed)
Assisted tele visit to patient with family member. X2 times today.   Mcarthur Rossetti, RN

## 2019-04-16 NOTE — Progress Notes (Signed)
Nutrition Brief Note  Chart reviewed. Pt now transitioning to comfort care.  No further nutrition interventions warranted at this time.  Please re-consult as needed.   [employer] MS, RDN, LDN, CNSC [phone] Pager  [phone] Weekend/On-Call Pager

## 2019-04-17 DIAGNOSIS — Z66 Do not resuscitate: Secondary | ICD-10-CM

## 2019-04-17 DIAGNOSIS — I959 Hypotension, unspecified: Secondary | ICD-10-CM

## 2019-04-17 DIAGNOSIS — Z7189 Other specified counseling: Secondary | ICD-10-CM

## 2019-04-17 LAB — CULTURE, BLOOD (ROUTINE X 2)
Culture: NO GROWTH
Culture: NO GROWTH
Special Requests: ADEQUATE

## 2019-04-17 LAB — GLUCOSE, CAPILLARY: Glucose-Capillary: 120 mg/dL — ABNORMAL HIGH (ref 70–99)

## 2019-04-17 NOTE — Progress Notes (Signed)
Assisted tele visit to patient with family members.  India Jolin Anderson, RN   

## 2019-04-17 NOTE — Progress Notes (Addendum)
NAME:  Andrea Glenn, MRN:  [id number], DOB:  February 12, 1961, LOS: 9 ADMISSION DATE:  03/31/2019, CONSULTATION DATE:  04/12/2019 REFERRING MD: Dolores Hoose CHIEF COMPLAINT:  Hypoxia, AMS    Brief History   58 yo female presented to ER with leg swelling and pain.  Found to have4 COVID 19 pneumonia.  Has hx of nephrotic syndrome with FSGS and DM nephropathy, CKD 4.  She was previously treated with prograf, and last received rituximab in October 2020.  Found to have worsening renal fx.  Had fever 101.8.  required supplemental oxygen.  U/A suggestive of UTI.  Found to have elevated troponin and a fib with RVR.  Had HD catheter placed with plan for iHD.  Had worsening mental status.  CT head suggestive of acute CVA.  She was transferred to ICU.  Past Medical History  A fib, CKD 4, DM, DM nephropathy, DM neuropathy, FSGS, Emphysema, Frequent falls, HLD, HTN, Pancreatitis, Shingles, RLS, Tremor  Significant Hospital Events   12/20 Admitted 12/24 transfer to ICU 12/25 covert to sinus bradycardia >> amiodarone held 12/26 start on precedex, transfuse 1 unit PRBC 12/27 worsening mental status, severe anemia, intubated 12/27: comfort care measures initiated   Consults:  Nephrology Neurology Cardiology >> s/o 12/27 Palliative care  Procedures:  12/22 right IJ tunneled HD catheter >>  12/27 ETT >> 12/27  Significant Diagnostic Tests:  Echo 12/23 >> EF 50 to 55% CT head 12/24 >> low density Rt cerebellar hemisphere concerning for acute to subacute infarcts, chronic b/l BG lacunar infarcts CT angio head/neck 12/24 >> calcification Lt carotid bifurcation, diffuse small vessel disease MRI brain 12/25 >> widespread multifocal acute ischemia within both cerebral and cerebellar hemispheres  Micro Data:  12/20 SARS Cov2 >> positive 12/20 Blood >> Bacillus  12/20 Urine >> Klebsiella-ampicillin resistant, nitrofurantoin intermediate 12/24 Blood >>  COVID 19 treatment:  Remdesivir 12/20  >>12/24 Dexamethasone 12/20 >>  Antimicrobials/COVID19 treatment  Azithromycin 12/19 x 1 dose Ceftriaxone 12/19>12/21, 12/23>12/24 Cefazolin 12/22 SCIP Cefepime 12/24 >> 12/25 Vancomycin 12/24 >> 12/25  Interim history/subjective:  12/29: remains comfort care. Still with agonal respirations on fentanyl infusion, unresponsive.  12/28: transitioned to comfort yesterday. Agonal respirations today. Unresponsive. D/w daughter, as she was encouraged her mother is still alive and was inquiring about reversing her decision. I d/w her that her mother is dying and that reversing her decision would not change her outcome. She expressed understanding and is continuing with comfort measures at this time.  12/27:Intubated overnight.  Received PRBC transfusions.  Objective   Blood pressure (!) 54/32, pulse (!) 37, temperature (!) 97.5 F (36.4 C), temperature source Axillary, resp. rate (!) 6, height 5\' 4"  (1.626 m), weight 56.5 kg, SpO2 93 %.        Intake/Output Summary (Last 24 hours) at 04/17/2019 1215 Last data filed at 04/17/2019 0700 Gross per 24 hour  Intake 773.61 ml  Output 0 ml  Net 773.61 ml   Filed Weights   04/13/19 0500 04/14/19 0500 03/29/2019 0500  Weight: 61.8 kg 60.2 kg 56.5 kg    Examination:  General - obtunded Eyes - unequal pupils ENT - ETT in place Cardiac - regular rate/rhythm, no murmur Chest - scattered rhonchi Abdomen - soft, non tender, decreased bowel sounds Extremities - 1+ edema Skin - no rashes Neuro - not following commands  CXR (reviewed by me) - Rt > Lt ASD   Resolved Hospital Problem list   Klebsiella UTI  Assessment & Plan:   Compromised airway requiring intubation  12/27. - comfort measures.  -agonal respirations at this time.  Acute hypoxic respiratory failure from COVID 19 pneumonia. - goal SpO2 > 90% - s/p decadron - completed remdesivir - zinc, vit C, vit D  Acute metabolic encephalopathy from hypoxia, renal failure, COVID 19  pneumonia, and UTI. Multifocal acute CVA >> likely embolic. - worsening mental status 12/27 in setting of severe anemia and hypotension; concerned she has extended her stroke - comfort measures. Fentanyl as needed - RASS goal 0  Symptomatic anemia. - no clear source of bleeding  -no longer following labs  New onset A fib with RVR >> converted to sinus bradycardia 12/25. Elevated troponin from demand ischemia. - monitor clinically  AKI from ATN. CKD 4 with FSGS and DM nephropathy. Metabolic acidosis. - renal replacement per nephrology  Poorly controlled DM type 2 with steroid induced hyperglycemia. - SSI with lantus  Goals of care. -maintain comfort.    Best practice:  Diet: DVT prophylaxis: SCDs GI prophylaxis: protonix Mobility: Bedrest Code Status: comfort measures.  Disposition: ICU Family communication: updated daughter Ms. McQueen via phone this afternoon.  Labs    CMP Latest Ref Rng & Units 03/23/2019 04/06/2019 04/14/2019  Glucose 70 - 99 mg/dL - 112(H) 143(H)  BUN 6 - 20 mg/dL - 17 24(H)  Creatinine 0.44 - 1.00 mg/dL - 1.18(H) 1.36(H)  Sodium 135 - 145 mmol/L 139 139 137  Potassium 3.5 - 5.1 mmol/L 4.0 4.7 5.2(H)  Chloride 98 - 111 mmol/L - 106 104  CO2 22 - 32 mmol/L - 20(L) 23  Calcium 8.9 - 10.3 mg/dL - 7.3(L) 7.4(L)  Total Protein 6.5 - 8.1 g/dL - - -  Total Bilirubin 0.3 - 1.2 mg/dL - - -  Alkaline Phos 38 - 126 U/L - - -  AST 15 - 41 U/L - - -  ALT 0 - 44 U/L - - -    CBC Latest Ref Rng & Units 03/27/2019 03/24/2019 04/14/2019  WBC 4.0 - 10.5 K/uL - 16.1(H) 21.5(H)  Hemoglobin 12.0 - 15.0 g/dL 6.1(LL) 3.8(LL) 9.4(L)  Hematocrit 36.0 - 46.0 % 18.0(L) 11.3(L) 27.7(L)  Platelets 150 - 400 K/uL - 96(L) 107(L)    ABG    Component Value Date/Time   PHART 7.345 (L) 03/31/2019 0904   PCO2ART 45.1 04/14/2019 0904   PO2ART 537.0 (H) 04/17/2019 0904   HCO3 24.8 04/06/2019 0904   TCO2 26 04/11/2019 0904   ACIDBASEDEF 1.0 03/22/2019 0904   O2SAT  100.0 03/22/2019 0904    CBG (last 3)  Recent Labs    04/14/2019 1211 04/16/19 1929 04/17/19 0814  GLUCAP 191* 155* 120*   Critical care time: The patient is critically ill with multiple organ systems failure and requires high complexity decision making for assessment and support, frequent evaluation and titration of therapies, application of advanced monitoring technologies and extensive interpretation of multiple databases.  Critical care time 35 mins. This represents my time independent of the NPs time taking care of the pt. This is excluding procedures.    Audria Nine DO [facility] 04/17/2019, 12:15 PM

## 2019-04-17 NOTE — Progress Notes (Signed)
Attempted to call pt's grandson to let him speak to her over the phone per pt's daughter Monique's request. However, he did not answer.

## 2019-04-17 NOTE — Progress Notes (Signed)
PALLIATIVE NOTE:  Updates Received and Chart reviewed. Patient is comfort care.   Daughter Beckie Busing called and updates provided. She spent time expressing frustration that family member Hilda Blades) could not get an update on patient. Therapeutic listening provided and explained to daughter it is best that family receives updates from the POA. Daughter explains she is not in the state to provide updates and worries she may not provide correct information as she is in a difficult place being this is her mother who is dying. Emotional support provided.   Beckie Busing shares her concerns with updates and expressing she felt her mother was still trying to fight since she has not passed away as expected. I attempted to explain that as a medical team we have no direct way of knowing when a patient will pass away however we know that based on trajectory of disease prognosis can be given but not definitive. She verbalized understanding but also continued to express her frustration as to having to know patient is hanging on and concerns that under comfort care maybe patient is not being given a chance to survive and recover. I spent a great deal of time providing updates and supporting current plan of care identifying that patient is actively dying. I reviewed vitals signs and nursing assessments. Daughter verbalized understanding and appreciation. She expressed she is struggling in thoughts of knowing or not knowing that her mother is trying to fight and maybe attempting aggressive care to allow her this opportunity. She reports that she has not signed any documents finalizing that comfort is the only care she would want indefinitely and expressing she knows she can change her mind whenever she wishes. Therapeutic listening and support provided.   I again explained to daughter to consider her mother's quality of life and what would be most important to her (passing comfortably and at peace versus having aggressive  interventions which most likely still result in her death but causing more trauma. Assurance provided that her mother's prognosis is poor and despite medical interventions it would only prolong her life and or cause additional suffering. Monique verbalized understanding and appreciation. She is tearful expressing she remains in question if her mother is truly trying to show signs that she wants to fight but also acknowledges that she hears what we as a medical team is saying.   All questions answered and daughter will call with any further needs.   Plan -Continue with current comfort plan of care per medical team Jerene Pitch, NP updated and Sam, RN) -PMT will continue to support and follow  Total Time: 45 min.   Greater than 50%  of this time was spent counseling and coordinating care related to the above assessment and plan.  Alda Lea, AGPCNP-BC Palliative Medicine Team

## 2019-04-18 DIAGNOSIS — N032 Chronic nephritic syndrome with diffuse membranous glomerulonephritis: Secondary | ICD-10-CM

## 2019-04-18 LAB — TYPE AND SCREEN
ABO/RH(D): AB POS
Antibody Screen: NEGATIVE
Donor AG Type: NEGATIVE
Donor AG Type: NEGATIVE
Donor AG Type: NEGATIVE
Donor AG Type: NEGATIVE
Donor AG Type: NEGATIVE
Donor AG Type: NEGATIVE
Unit division: 0
Unit division: 0
Unit division: 0
Unit division: 0
Unit division: 0
Unit division: 0

## 2019-04-18 LAB — BPAM RBC
Blood Product Expiration Date: 202101202359
Blood Product Expiration Date: 202101222359
Blood Product Expiration Date: 202101262359
Blood Product Expiration Date: 202101282359
Blood Product Expiration Date: 202101282359
Blood Product Expiration Date: 202101282359
ISSUE DATE / TIME: 202012261353
ISSUE DATE / TIME: 202012270643
ISSUE DATE / TIME: 202012270716
ISSUE DATE / TIME: 202012270813
ISSUE DATE / TIME: 202012270813
ISSUE DATE / TIME: 202012270822
Unit Type and Rh: 6200
Unit Type and Rh: 6200
Unit Type and Rh: 6200
Unit Type and Rh: 6200
Unit Type and Rh: 7300
Unit Type and Rh: 7300

## 2019-04-20 NOTE — Progress Notes (Signed)
NAME:  Andrea Glenn, MRN:  [id number], DOB:  1960/11/24, LOS: 10 ADMISSION DATE:  03/30/2019, CONSULTATION DATE:  04/12/2019 REFERRING MD: Andrea Glenn CHIEF COMPLAINT:  Hypoxia, AMS    Brief History   59 yo female presented to ER with leg swelling and pain.  Found to have4 COVID 19 pneumonia.  Has hx of nephrotic syndrome with FSGS and DM nephropathy, CKD 4.  She was previously treated with prograf, and last received rituximab in October 2020.  Found to have worsening renal fx.  Had fever 101.8.  required supplemental oxygen.  U/A suggestive of UTI.  Found to have elevated troponin and a fib with RVR.  Had HD catheter placed with plan for iHD.  Had worsening mental status.  CT head suggestive of acute CVA.  She was transferred to ICU.  Past Medical History  A fib, CKD 4, DM, DM nephropathy, DM neuropathy, FSGS, Emphysema, Frequent falls, HLD, HTN, Pancreatitis, Shingles, RLS, Tremor  Significant Hospital Events   12/20 Admitted - cpvod + 12/24 transfer to ICU 12/25 covert to sinus bradycardia >> amiodarone held 12/26 start on precedex, transfuse 1 unit PRBC 12/27 worsening mental status, severe anemia, intubated 12/27: comfort care measures initiated 12/28: transitioned to comfort yesterday. Agonal respirations today. Unresponsive. D/w daughter, as she was encouraged her mother is still alive and was inquiring about reversing her decision. I d/w her that her mother is dying and that reversing her decision would not change her outcome. She expressed understanding and is continuing with comfort measures at this time.  12/29: remains comfort care. Still with agonal respirations on fentanyl infusion, unresponsive.   Consults:  Nephrology Neurology Cardiology >> s/o 12/27 Palliative care  Procedures:  12/22 right IJ tunneled HD catheter >>  12/27 ETT >> 12/27  Significant Diagnostic Tests:  Echo 12/23 >> EF 50 to 55% CT head 12/24 >> low density Rt cerebellar hemisphere concerning for acute  to subacute infarcts, chronic b/l BG lacunar infarcts CT angio head/neck 12/24 >> calcification Lt carotid bifurcation, diffuse small vessel disease MRI brain 12/25 >> widespread multifocal acute ischemia within both cerebral and cerebellar hemispheres  Micro Data:  12/20 SARS Cov2 >> positive 12/20 Blood >> Bacillus  12/20 Urine >> Klebsiella-ampicillin resistant, nitrofurantoin intermediate 12/24 Blood >>  COVID 19 treatment:  Remdesivir 12/20 >>12/24 Dexamethasone 12/20 >>  Antimicrobials/COVID19 treatment  Azithromycin 12/19 x 1 dose Ceftriaxone 12/19>12/21, 12/23>12/24 Cefazolin 12/22 SCIP Cefepime 12/24 >> 12/25 Vancomycin 12/24 >> 12/25  Interim history/subjective:   2019-05-07 - not on vent. On fent gttt. Agonal and unresponsi. Last CCRT 12/25. Baseline anuric per rN. HR 20s   Objective   Blood pressure (!) 46/34, pulse (!) 35, temperature (!) 97.5 F (36.4 C), temperature source Axillary, resp. rate (!) 8, height 5\' 4"  (1.626 m), weight 56.5 kg, SpO2 93 %.        Intake/Output Summary (Last 24 hours) at 05-07-2019 0950 Last data filed at May 07, 2019 0600 Gross per 24 hour  Intake 584.88 ml  Output --  Net 584.88 ml   Filed Weights   04/13/19 0500 04/14/19 0500 04/09/2019 0500  Weight: 61.8 kg 60.2 kg 56.5 kg    Examination:  Frail , cachectic RASS -5 equivalent Agonal Comfortable No snorings   Resolved Hospital Problem list   Klebsiella UTI  Assessment & Plan:   Compromised airway requiring intubation 12/27. Acute hypoxic respiratory failure from COVID 19 pneumonia. - goal SpO2 > 90% - s/p decadron - completed remdesivir - zinc, vit C, vit D  Acute metabolic encephalopathy from hypoxia, renal failure, COVID 19 pneumonia, and UTI. Multifocal acute CVA >> likely embolic. - worsening mental status 12/27 in setting of severe anemia and hypotension; concerned she has extended her stroke  Symptomatic anemia. - no clear source of bleeding -no  longer following labs  New onset A fib with RVR >> converted to sinus bradycardia 12/25. Elevated troponin from demand ischemia.   AKI from ATN. CKD 4 with FSGS and DM nephropathy. Metabolic acidosis. - last CCRT 12/25   Poorly controlled DM type 2 with steroid induced hyperglycemia.   DNR Terminal Care Comfort care   - 12/30 - due to all of the above. On fent gtt. Agona and HR 20-30s and RR 4-8/min  Plan  - continue comfort - anticipate death in hours to days but rarely weks (actively dying)  Best practice:  Diet: DVT prophylaxis: SCDs GI prophylaxis: protonix Mobility: Bedrest Code Status: comfort measures.  Disposition: ICU Family communication: per RN - family now reporting desire to reverse code status. Called and spoke to daughter Andrea Glenn [id number] - > explained to dagher patient is actively dying and she is comfortable. Daughter broke out into crying inconsolably. Asking if she can visit patient at bedside - will give her closure. Referred to RN. She did not ask MD to reverse course of care   [place]   The patient Andrea Glenn is critically ill with multiple organ systems failure and requires high complexity decision making for assessment and support, frequent evaluation and titration of therapies, application of advanced monitoring technologies and extensive interpretation of multiple databases.   Critical Care Time devoted to patient care services described in this note is  30  Minutes. This time reflects time of care of this signee Dr Andrea Glenn. This critical care time does not reflect procedure time, or teaching time or supervisory time of PA/NP/Med student/Med Resident etc but could involve care discussion time     Dr. Brand Glenn, M.D., [facility].C.P Pulmonary and Critical Care Medicine Staff Physician [place] [facility] Pager: [phone], If no answer or between  15:00h -  7:00h: call [phone]  2019/04/28 9:50 AM    LABS    PULMONARY Recent Labs  Lab 04/12/19 0744 04/04/2019 0904  PHART 7.246* 7.345*  PCO2ART 32.5 45.1  PO2ART 64.7* 537.0*  HCO3 14.0* 24.8  TCO2  --  26  O2SAT 90.2 100.0    CBC Recent Labs  Lab 04/14/19 0335 04/14/19 1900 04/04/2019 0520 03/26/2019 0904  HGB 6.8* 9.4* 3.8* 6.1*  HCT 19.9* 27.7* 11.3* 18.0*  WBC 22.3* 21.5* 16.1*  --   PLT 121* 107* 96*  --     COAGULATION Recent Labs  Lab 03/27/2019 0633  INR 2.0*    CARDIAC  No results for input(s): TROPONINI in the last 168 hours. No results for input(s): PROBNP in the last 168 hours.   CHEMISTRY Recent Labs  Lab 04/13/19 0447 04/13/19 0848 04/13/19 1016 04/13/19 1518 04/14/19 0335 04/14/19 1900 04/07/2019 0425 04/14/2019 0904  NA   < > 141  --  140 139 137 139 139  K   < > 4.7  --  4.3 3.9 5.2* 4.7 4.0  CL  --  106  --  108 104 104 106  --   CO2  --  21*  --  21* 22 23 20*  --   GLUCOSE  --  99  --  134* 204* 143* 112*  --   BUN  --  55*  --  46* 39* 24* 17  --   CREATININE  --  3.55*  --  2.93* 2.20* 1.36* 1.18*  --   CALCIUM  --  7.1*  --  6.7* 7.1* 7.4* 7.3*  --   MG  --   --  2.3 2.2 2.2 2.4 2.3  --   PHOS   < > 4.9* 4.4 3.7 2.8 2.4* 2.3*  --    < > = values in this interval not displayed.   Estimated Creatinine Clearance: 44.3 mL/min (A) (by C-G formula based on SCr of 1.18 mg/dL (H)).   LIVER Recent Labs  Lab 04/12/19 0710 04/13/19 0447 04/13/19 0848 04/13/19 1518 04/14/19 0335 04/14/19 1900 03/31/2019 0425 04/05/2019 0633  AST 66* 57*  --   --   --   --   --   --   ALT 17 17  --   --   --   --   --   --   ALKPHOS 64 80  --   --   --   --   --   --   BILITOT 0.4 0.9  --   --   --   --   --   --   PROT 5.0* 4.7*  --   --   --   --   --   --   ALBUMIN 1.2* 1.3* 1.3* 1.3* 1.4* 1.5* 2.3*  --   INR  --   --   --   --   --   --   --  2.0*     INFECTIOUS Recent Labs  Lab 04/12/19 0855 04/12/19 1200 03/29/2019 0639  LATICACIDVEN  1.7 1.8 6.8*     ENDOCRINE CBG (last 3)  Recent Labs    03/26/2019 1211 04/16/19 1929 04/17/19 0814  GLUCAP 191* 155* 120*         IMAGING x48h  - image(s) personally visualized  -   highlighted in bold No results found.

## 2019-04-20 NOTE — Progress Notes (Signed)
Patient taken to morgue, lines pulled and family previously took belongings.

## 2019-04-20 NOTE — Progress Notes (Signed)
Patient time of death 1225, Pronounced by 2 nurses at the bedside. Called monique- patients daughter to update her and give her last option to video in to say goodbye. She declined and hung up the phone.

## 2019-04-20 NOTE — Progress Notes (Signed)
Chaplain was with family as they visited the patient.  The chaplain was there as a supportive presence while family had limited time to visit with their loved one who is going into palliative care.  The chaplain will follow-up only if necessary.  Brion Aliment Chaplain Resident For questions concerning this note please contact me by pager [phone]

## 2019-04-20 NOTE — Progress Notes (Addendum)
Palliative follow up.  Chart reviewed.  Discussed patient presentation with ICU RN Marylyn Ishihara.    Patient has a heart rate in the 20s and agonal breathing.  Marylyn Ishihara describes no increased work of breathing or signs of distress.  We discussed current scheduled and PRN medications.   Patient comfortable at this point.  Actively dying.   PMT will continue to follow with you.  Recommend comfort only measures be continued.  Greater than 50%  of this time was spent counseling and coordinating care related to the above assessment and plan.   The above conversation was completed via telephone due to the  restrictions during the COVID-19 pandemic. Thorough chart review and discussion with necessary members of the care team was completed as part of assessment. All issues were discussed and addressed but no physical exam was performed.   Florentina Jenny, PA-C Palliative Medicine Office:  [phone]  15 min.

## 2019-04-20 NOTE — Progress Notes (Signed)
CDS notified of patient's TOD. Ruled out for donation due to COVID diagnosis.

## 2019-04-20 NOTE — Death Summary Note (Deleted)
Andrea Glenn was a 59 y.o. female admitted on 04/10/2019 with leg swelling and pain. Found to have4 COVID 19 pneumonia. Has hx of nephrotic syndrome with FSGS and DM nephropathy, CKD 4. She was previously treated with prograf, and last received rituximab in October 2020. Found to have worsening renal fx. Had fever 101.8. Required supplemental oxygen. U/A suggestive of UTI. Found to have elevated troponin and a fib with RVR. Had HD catheter placed with plan for iHD. Had worsening mental status. CT head suggestive of acute CVA. She was transferred to ICU.  MRI brain confirmed multifocal embolic CVA.  She developed progressive anemia leading to hemorrhagic shock.  She required intubation for hypoxia and airway protection.  CT abd/pelvis showed b/l retroperitoneal hemorrhage.  Palliative care consulted.  Family opted for DNR status and transition to comfort measures.  She was extubated on 04/08/2019 and expired at 16:23.  Final diagnoses: Hemorrhagic shock from acute blood loss anemia 2nd to bilateral retroperitoneal hemorrhage Acute hypoxic respiratory failure from COVID 19 pneumonia Compromised airway Acute metabolic encephalopathy from hypoxia, renal failure, pneumonia and UTI Multifocal acute embolic CVA New onset atrial fibrillation Elevated troponin from demand ischemia AKI from ischemic/hypoxic ATN CKD 4 with focal segmental glomerulosclerosis proven by biopsy and diabetic nephropathy Acute metabolic acidosis Poorly controlled diabetes mellitus type 2 with steroid induced hyperglycemia UTI from Klebsiella pneumoniae  Chesley Mires, MD [facility] 03/31/2019, 4:50 PM

## 2019-04-20 NOTE — Progress Notes (Signed)
Assisted tele visit to patient with daughter.  Zamoria Boss Anderson, RN   

## 2019-04-20 DEATH — deceased

## 2019-05-21 NOTE — Discharge Summary (Signed)
DISCHARGE SUMMARY    Date of admit: 04/12/2019 11:49 PM Date of discharge: 2019/05/10 12:25 PM Length of Stay: 10 days  PCP is Guadalupe Dawn, MD  CAUSE(S) OF DEATH  COVID-19 pneumonia   PROBLEM LIST Principal Problem:   Pneumonia due to COVID-19 virus Active Problems:   Anemia of renal disease   Hypotension   Acute-on-chronic kidney injury ([place])   Nephrotic syndrome   Severe sepsis ([place])   Acute cystitis without hematuria   Focal segmental glomerulosclerosis with chronic glomerulonephritis   Diabetic nephropathy (H[facility])   Metabolic acidosis, normal anion gap (NAG)   Encephalopathy acute   Atrial fibrillation ([place])   Acute hypoxemic respiratory failure due to COVID-19 [facility])   Hemodialysis status ([place])   Emphysema lung ([place])   AKI (acute kidney injury) ([place])   Hypothermia   Metabolic acidosis, increased anion gap   Cerebral embolism with cerebral infarction   Retroperitoneal bleed   Terminal care   Comfort measures only status    SUMMARY Andrea Glenn was 59 y.o. patient with    has a past medical history of Acute hypoxemic respiratory failure due to COVID-19 ([place]) (04/11/2019), Anemia of renal disease (06/21/2017), Atrial fibrillation ([place]) (04/11/2019), Chronic kidney disease (CKD), stage IV (severe) ([place]) (07/08/2017), CKD (chronic kidney disease), Diabetes mellitus without complication ([place]), Diabetic nephropathy ([place]) (04/11/2019), Emphysema lung ([place]) (04/11/2019), Fall (11/22/2018), Focal segmental glomerulosclerosis with chronic glomerulonephritis (04/11/2019), Frequent falls (07/22/2017), Grief (09/08/2017), Hematuria (01/20/2017), Hemodialysis status ([place]) (04/11/2019), Hyperlipidemia associated with type 2 diabetes mellitus ([place]) (10/26/2013), Hypertension, Nephrotic syndrome, Neuropathy, Pancreatitis (10/08/2016), Pneumonia due to COVID-19 virus (04/11/2019), Postherpetic neuralgia (09/08/2017), Primary hypertension (02/03/2017), Restless leg syndrome  (03/03/2010), Solitary pulmonary nodule (10/10/2017), Symptomatic anemia (04/05/2018), Tobacco use disorder (06/16/2006), Tremor, and Uncontrolled type 2 diabetes mellitus with peripheral neuropathy ([place]) (06/16/2006).   has a past surgical history that includes Shoulder surgery; Foot surgery; Back surgery; Cesarean section; IR Fluoro Guide CV Line Right (04/10/2019); and IR US Guide Vasc Access Right (04/10/2019).   Admitted on 04/04/2019 with    Brief History   59 yo female presented to ER with leg swelling and pain. Found to have4 COVID 19 pneumonia. Has hx of nephrotic syndrome with FSGS and DM nephropathy, CKD 4. She was previously treated with prograf, and last received rituximab in October 2020. Found to have worsening renal fx. Had fever 101.8. required supplemental oxygen. U/A suggestive of UTI. Found to have elevated troponin and a fib with RVR. Had HD catheter placed with plan for iHD. Had worsening mental status. CT head suggestive of acute CVA. She was transferred to ICU.  Significant Hospital Events   12/20 Admitted - cpvod + 12/24 transfer to ICU 12/25 covert to sinus bradycardia >> amiodarone held 12/26 start on precedex, transfuse 1 unit PRBC 12/27 worsening mental status, severe anemia, intubated 12/27: comfort care measures initiated 12/28: transitioned to comfort yesterday. Agonal respirations today. Unresponsive. D/w daughter, as she was encouraged her mother is still alive and was inquiring about reversing her decision. I d/w her that her mother is dying and that reversing her decision would not change her outcome. She expressed understanding and is continuing with comfort measures at this time.  12/29: remains comfort care. Still with agonal respirations on fentanyl infusion, unresponsive.  2019-05-10 - not on vent. On fent gttt. Agonal and unresponsi. Last CCRT 12/25. Baseline anuric per rN. HR    20s   Later passed away    SIGNED Dr. Brand Males,  M.D., [facility].C.P Pulmonary and Critical Care  Medicine Staff [place] [facility] Pager: [phone], If no answer or between  15:00h - 7:00h: call [phone]  05/06/2019 6:48 PM

## 2019-07-05 IMAGING — DX DG CHEST 2V
2 series · 2 of 2 positions shown · non-contrast
Comparison: None.

CLINICAL DATA: Shortness of breath with lower extremity edema

EXAM:
CHEST - 2 VIEW

[chest pa]
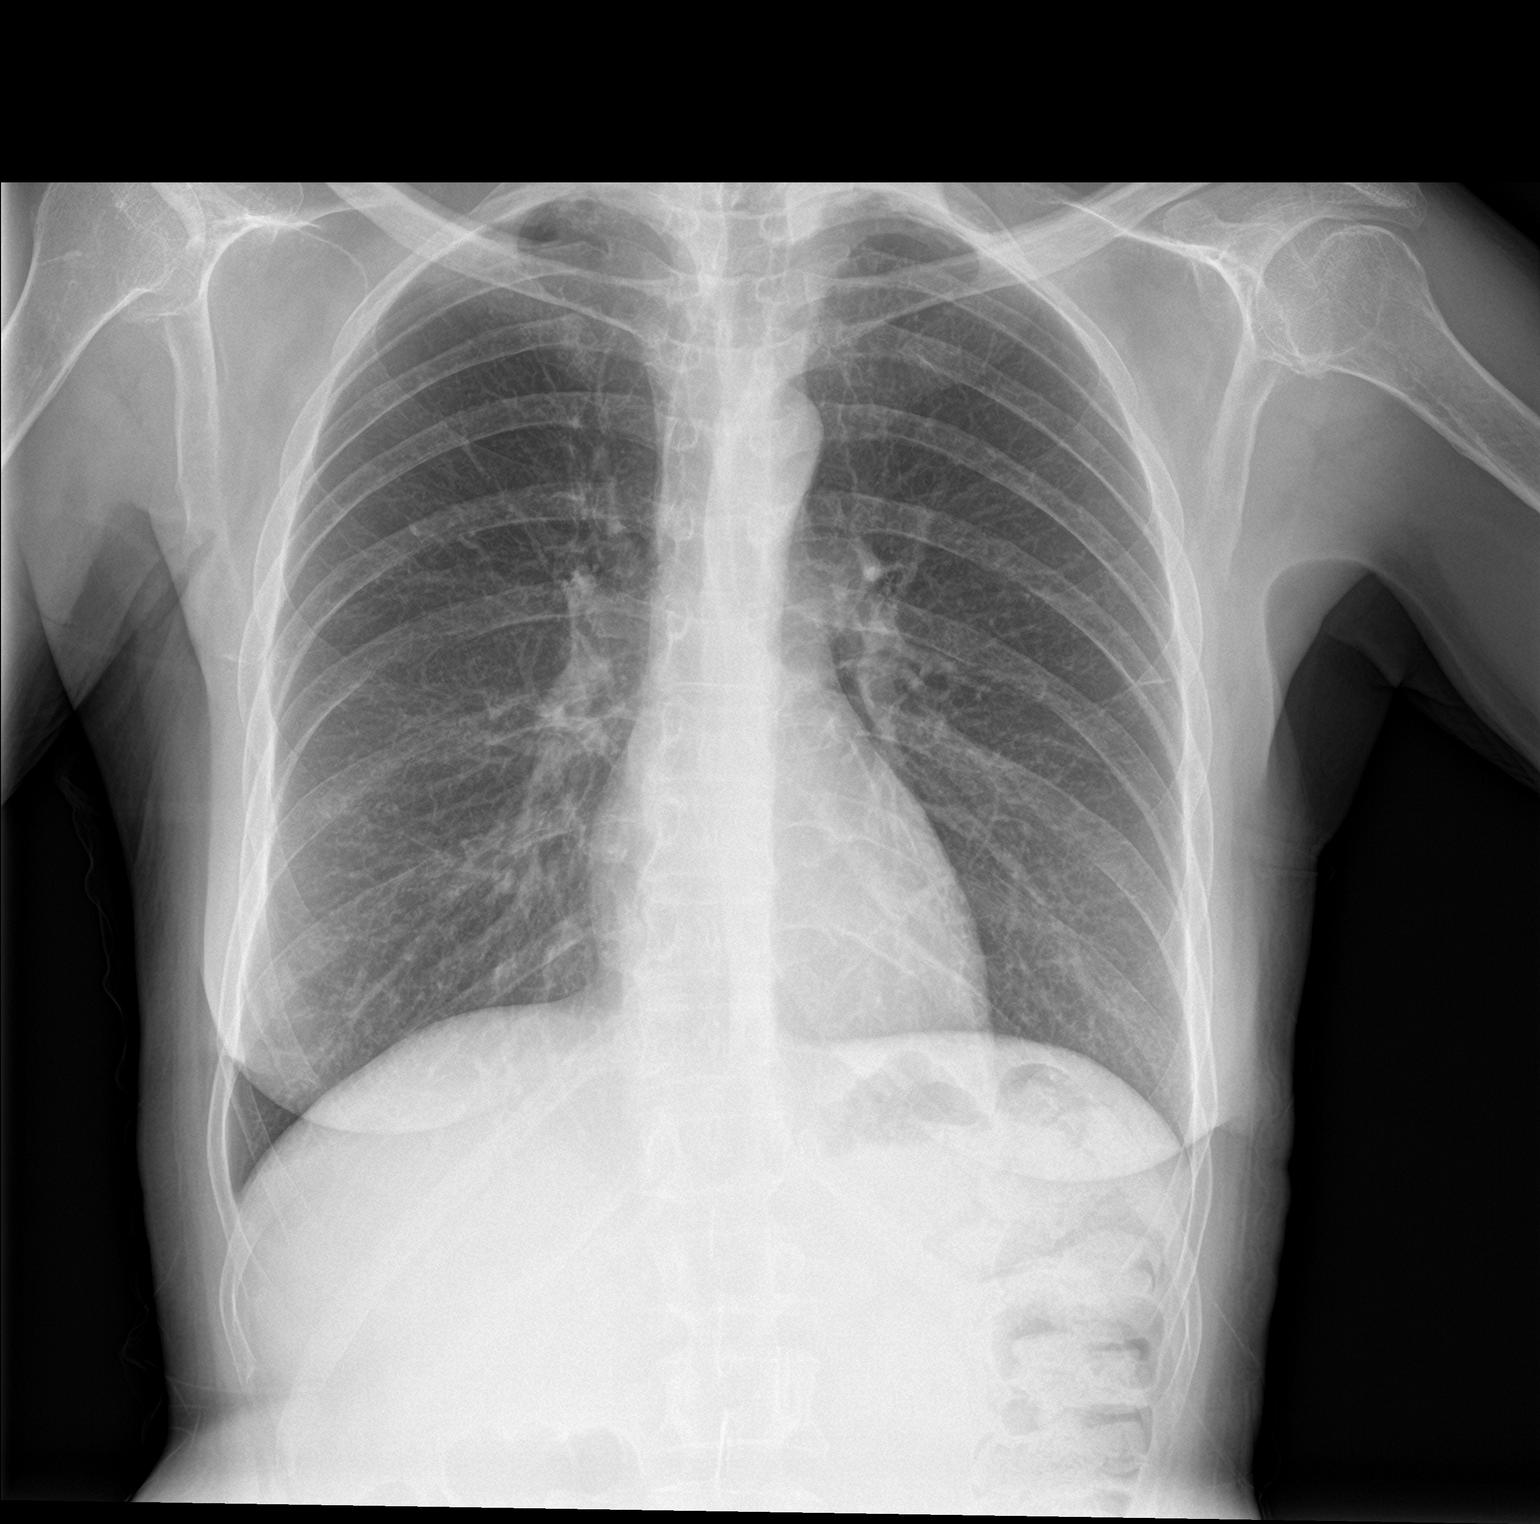

[chest lat]
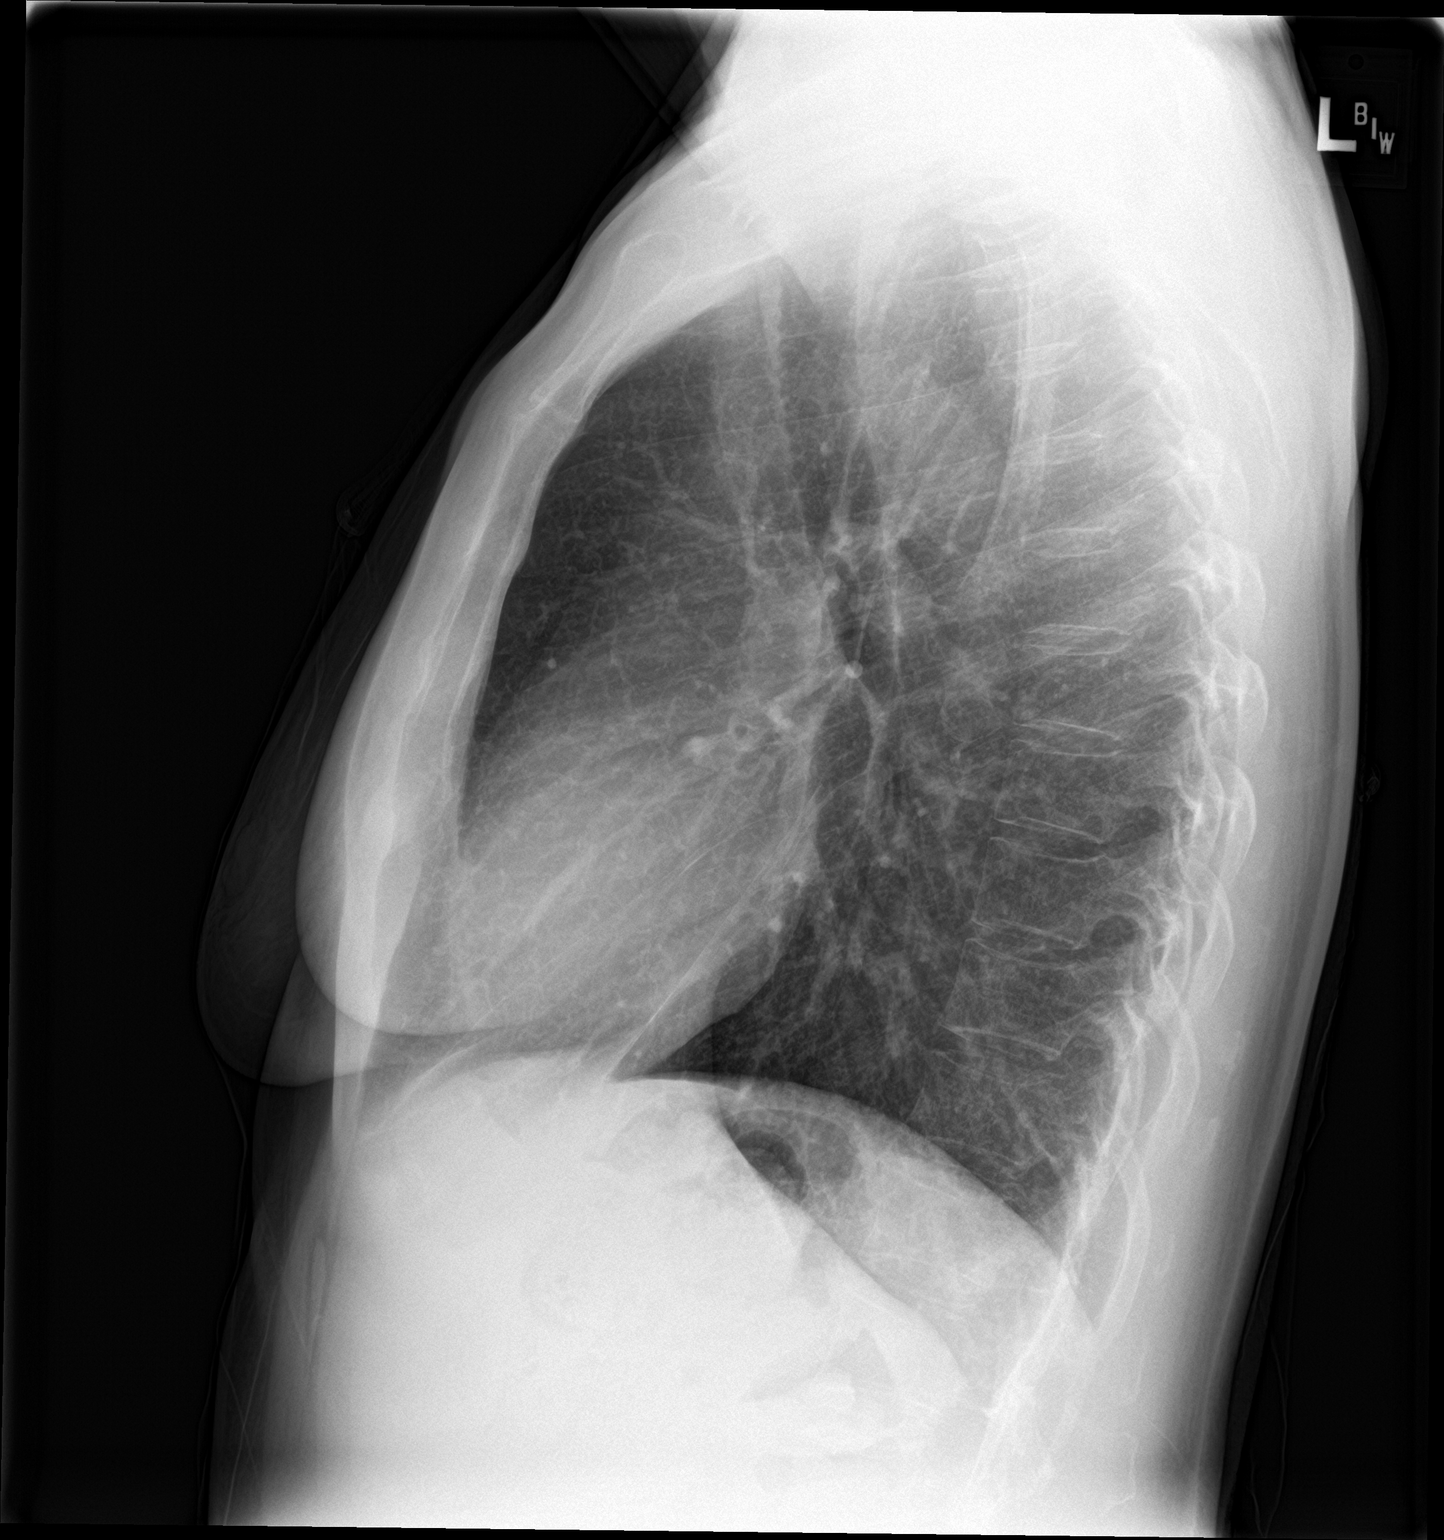

[2 of 2 positions shown; findings below may reference images not displayed]

FINDINGS: There is no edema or consolidation. Heart size and pulmonary
vascularity are normal. No adenopathy. No bone lesions. There is
slight upper thoracic leftward deviation of the trachea.
IMPRESSION: No edema or consolidation. Slight upper thoracic leftward deviation
of the trachea. Question localized thyroid enlargement in this area.

## 2021-03-31 IMAGING — US US RENAL
1 series · 14 of 25 positions shown · non-contrast
Comparison: 11/23/2018 ultrasound

CLINICAL DATA: 58-year-old female with acute kidney injury.
Hematuria.

EXAM:
RENAL / URINARY TRACT ULTRASOUND COMPLETE

[Series 1: us renal · 40 acquisitions, 14 frames shown]
[im 1/40]
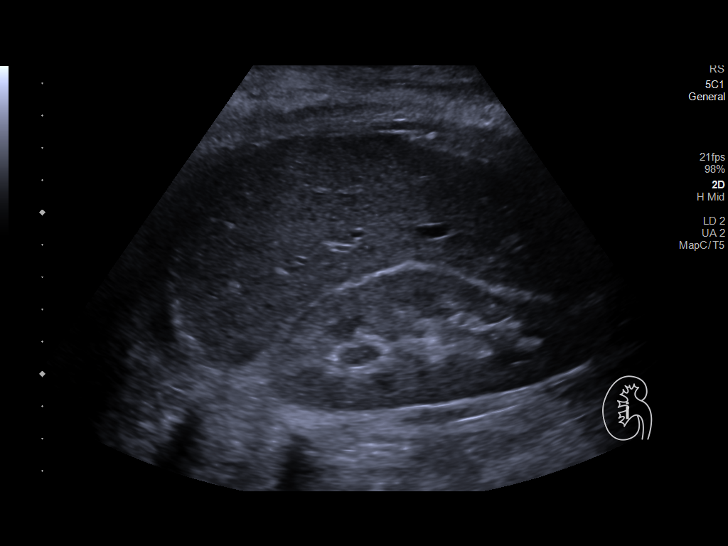
[im 4/40]
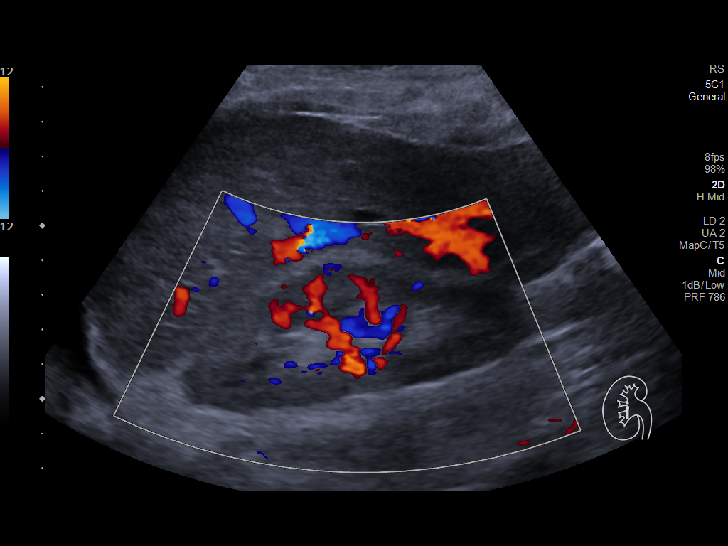
[im 7/40]
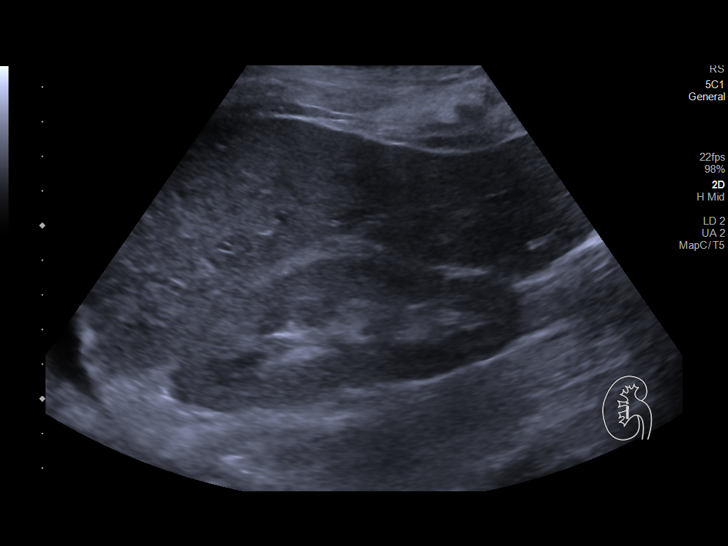
[im 10/40]
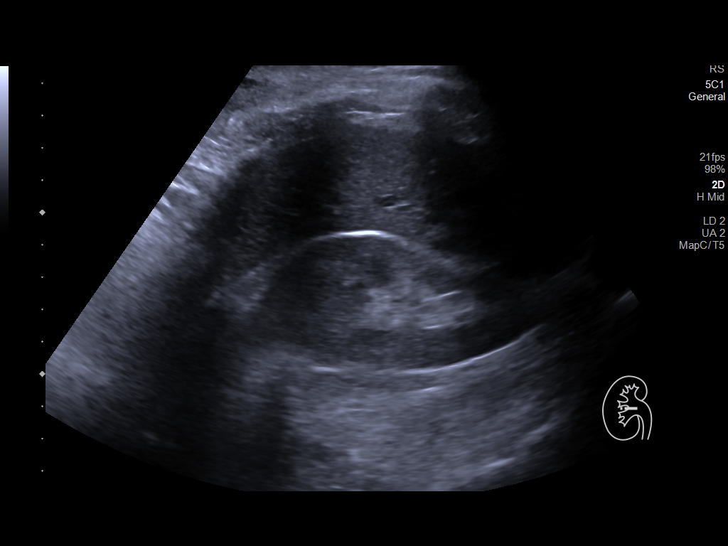
[im 14/40]
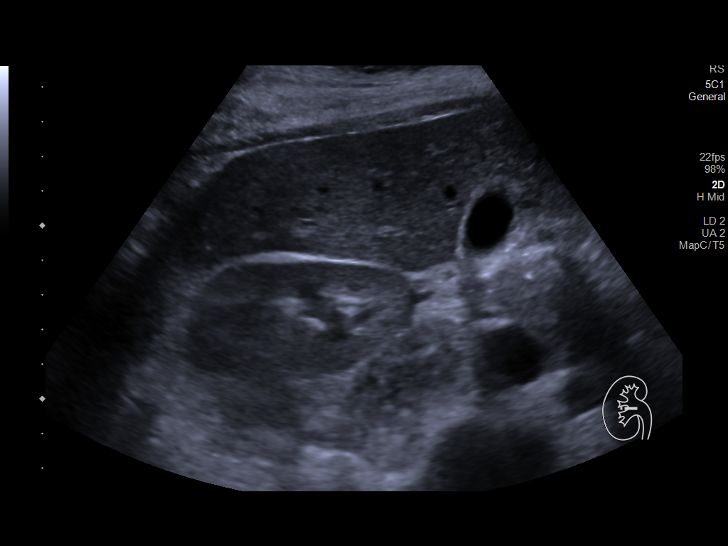
[im 15/40]
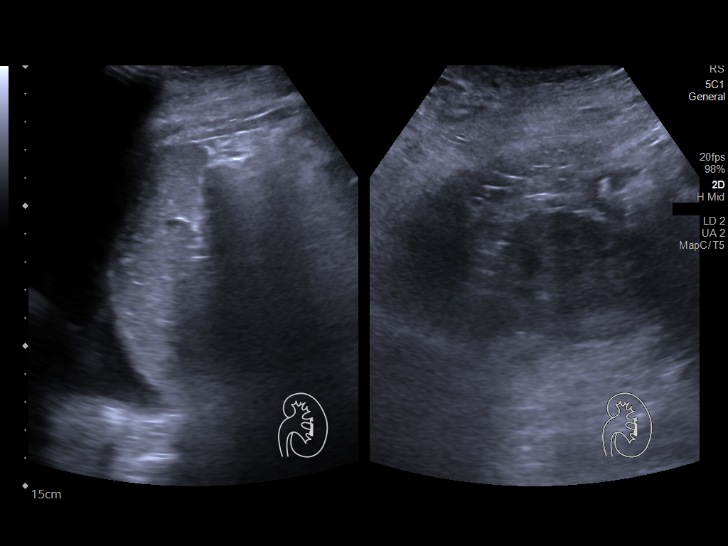
[im 18/40]
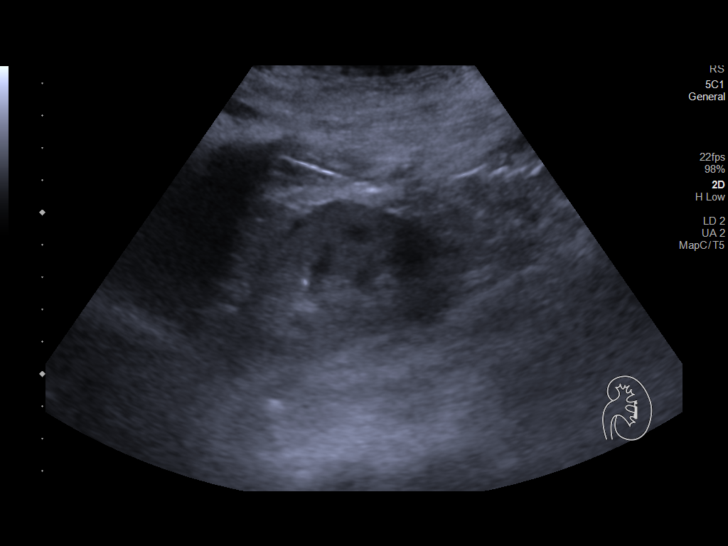
[im 22/40]
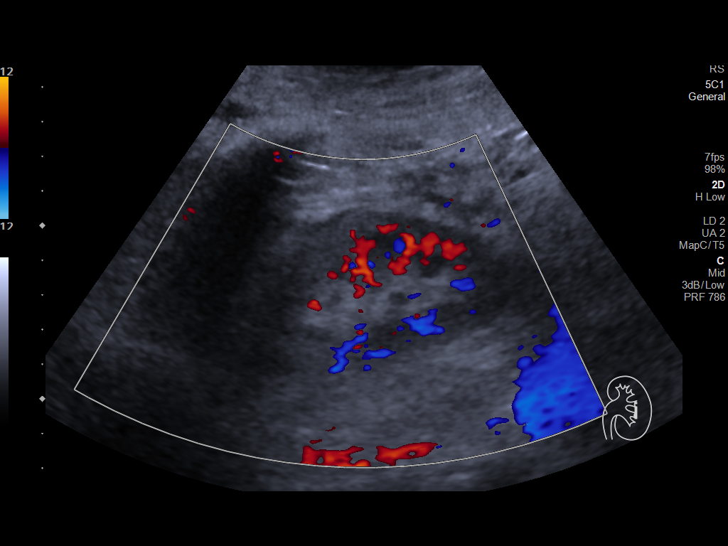
[im 25/40]
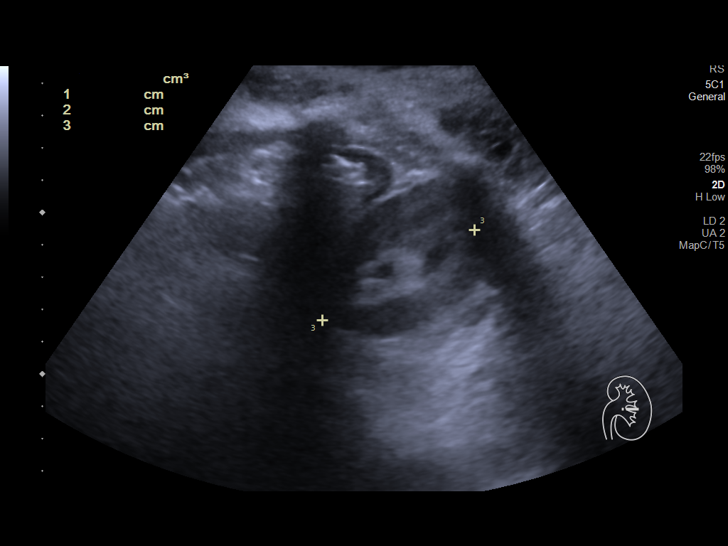
[im 27/40]
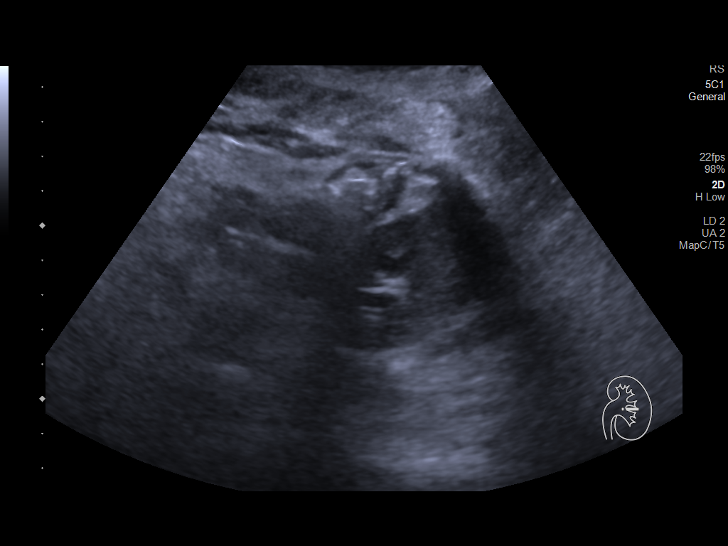
[im 30/40]
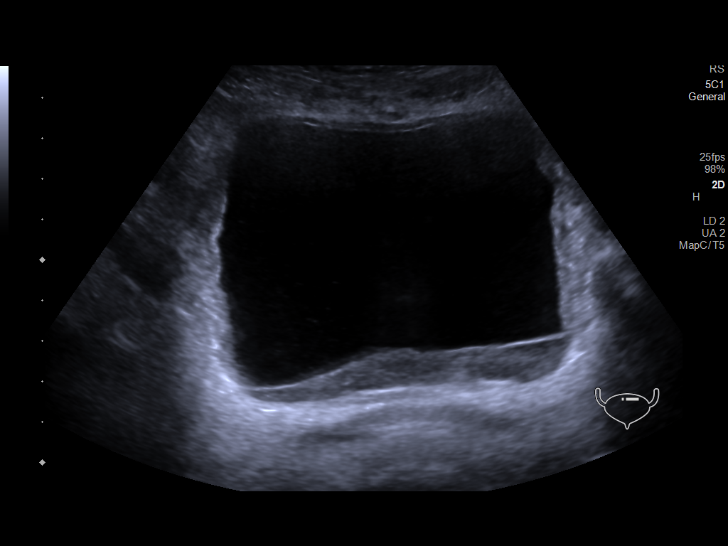
[im 33/40]
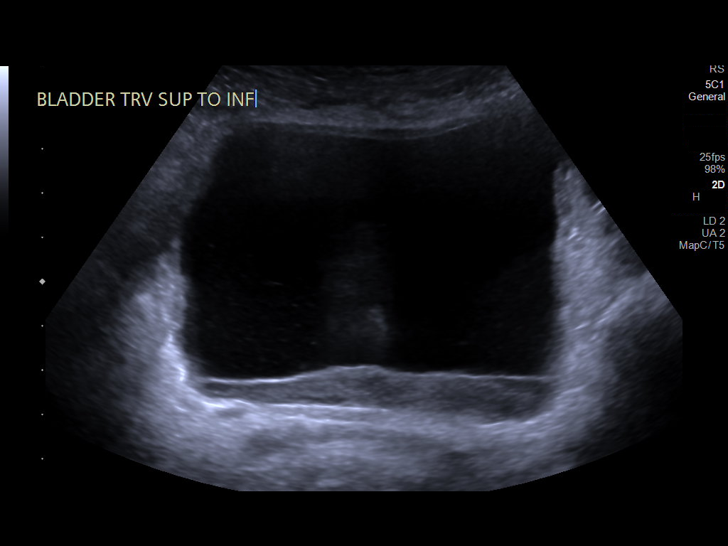
[im 36/40]
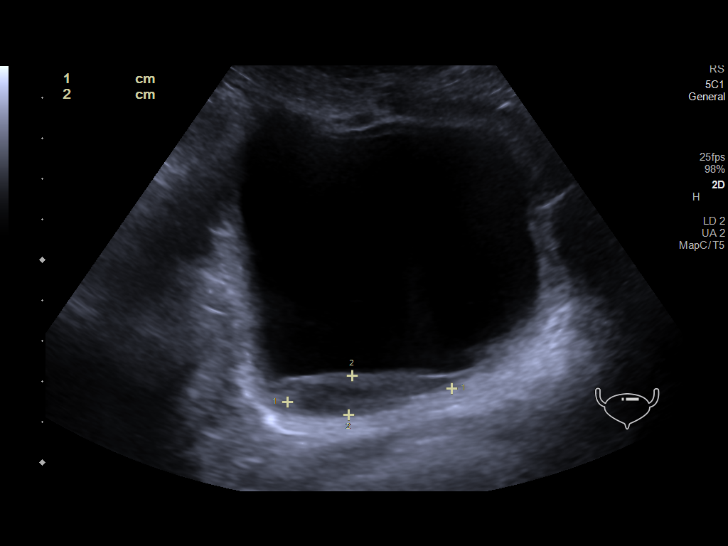
[im 40/40]
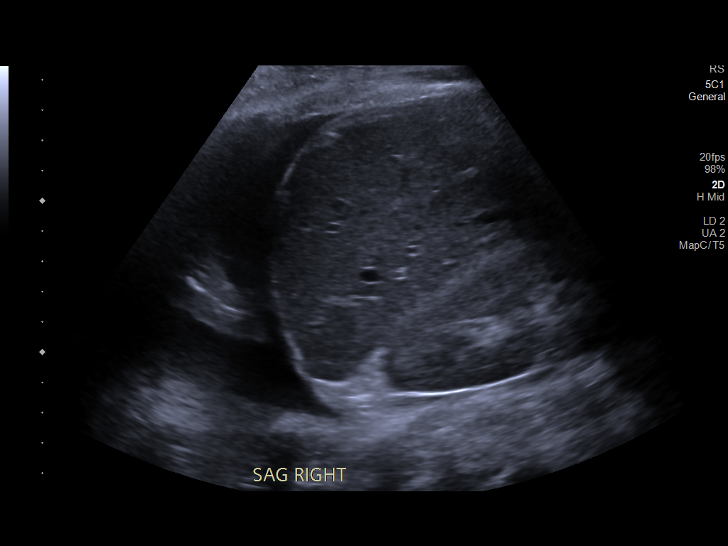

[14 of 25 positions shown; findings below may reference images not displayed]

FINDINGS: Right Kidney:

Renal measurements: 10.6 x 4.1 x 6 cm = volume: 136 mL .
Echogenicity within normal limits. No mass or hydronephrosis
visualized.

Left Kidney:

Renal measurements: 8.7 x 4.7 x 5.5 cm = volume: 116 mL.
Echogenicity within normal limits. No mass or hydronephrosis
visualized.

Bladder:

Complicated fluid within the bladder noted. No other significant
abnormality.

Other:

Bilateral pleural effusions are present.
IMPRESSION: 1. Unremarkable kidneys.
2. Complicated fluid within the bladder which may represent
infection or blood given history of hematuria.
3. Bilateral pleural effusions.

## 2021-03-31 IMAGING — DX DG CHEST 1V PORT
1 series · 1 of 1 positions shown · non-contrast
Comparison: Chest radiograph 07/12/2017

CLINICAL DATA: Edema. Patient reports fever and body aches.
Bilateral leg swelling.

EXAM:
PORTABLE CHEST 1 VIEW

[chest ap]
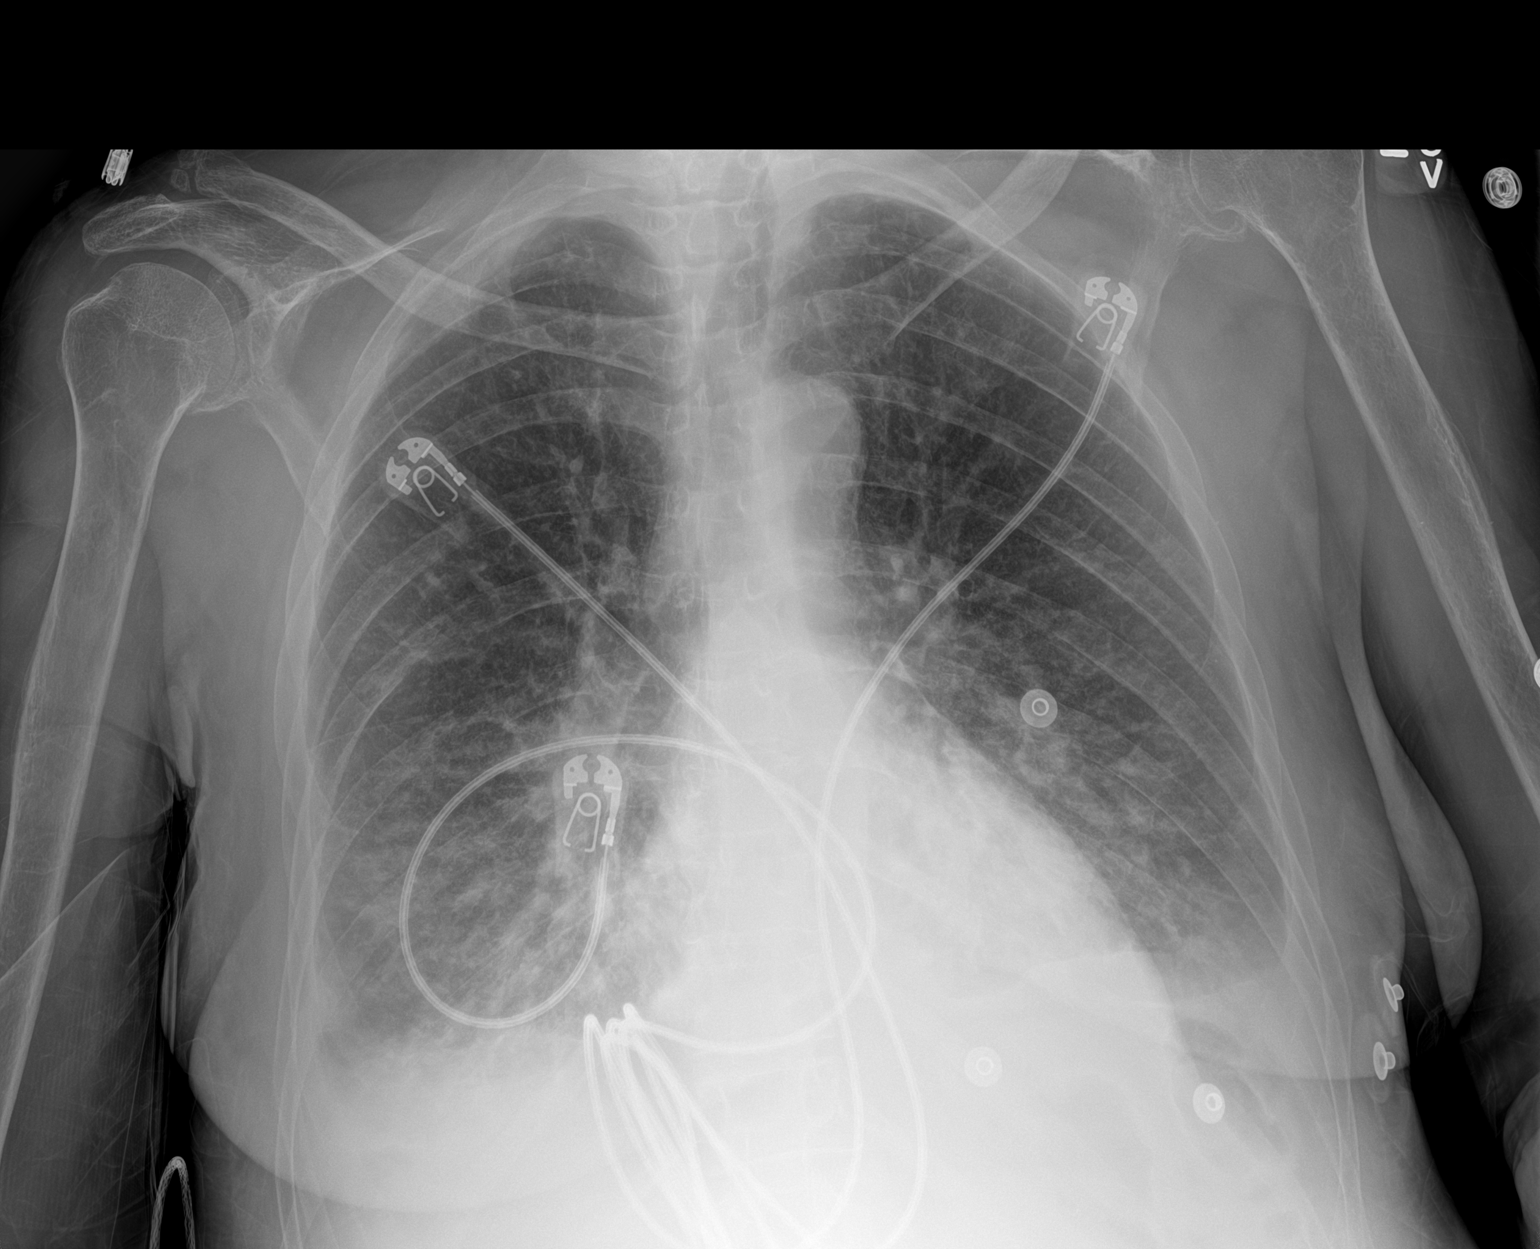

[1 of 1 positions shown; findings below may reference images not displayed]

FINDINGS: Borderline cardiomegaly, increased heart size from prior exam. There
are small bilateral pleural effusions. Patchy and interstitial
opacities within both lungs. No pneumothorax. Bones are under
mineralized. No acute osseous abnormalities are seen.
IMPRESSION: 1. Borderline cardiomegaly with bilateral pleural effusions.
2. Patchy and interstitial opacities, suggesting combination of
pulmonary edema and pneumonia, including atypical viral infection.
